# Patient Record
Sex: Female | Born: 1935 | Race: White | Hispanic: No | Smoking: Never smoker
Health system: Southern US, Community
[De-identification: ages and names within clinical notes are randomized; demographics above are authoritative.]

## PROBLEM LIST (undated history)

## (undated) DIAGNOSIS — M1990 Unspecified osteoarthritis, unspecified site: Secondary | ICD-10-CM

## (undated) DIAGNOSIS — E785 Hyperlipidemia, unspecified: Secondary | ICD-10-CM

## (undated) DIAGNOSIS — H409 Unspecified glaucoma: Secondary | ICD-10-CM

## (undated) DIAGNOSIS — I25810 Atherosclerosis of coronary artery bypass graft(s) without angina pectoris: Secondary | ICD-10-CM

## (undated) DIAGNOSIS — C4491 Basal cell carcinoma of skin, unspecified: Secondary | ICD-10-CM

## (undated) DIAGNOSIS — C801 Malignant (primary) neoplasm, unspecified: Secondary | ICD-10-CM

## (undated) DIAGNOSIS — I2510 Atherosclerotic heart disease of native coronary artery without angina pectoris: Secondary | ICD-10-CM

## (undated) DIAGNOSIS — M719 Bursopathy, unspecified: Secondary | ICD-10-CM

## (undated) DIAGNOSIS — Z87442 Personal history of urinary calculi: Secondary | ICD-10-CM

## (undated) DIAGNOSIS — R112 Nausea with vomiting, unspecified: Secondary | ICD-10-CM

## (undated) DIAGNOSIS — I214 Non-ST elevation (NSTEMI) myocardial infarction: Secondary | ICD-10-CM

## (undated) DIAGNOSIS — I639 Cerebral infarction, unspecified: Secondary | ICD-10-CM

## (undated) DIAGNOSIS — I729 Aneurysm of unspecified site: Secondary | ICD-10-CM

## (undated) DIAGNOSIS — J309 Allergic rhinitis, unspecified: Secondary | ICD-10-CM

## (undated) DIAGNOSIS — Z951 Presence of aortocoronary bypass graft: Secondary | ICD-10-CM

## (undated) DIAGNOSIS — I10 Essential (primary) hypertension: Secondary | ICD-10-CM

## (undated) DIAGNOSIS — L570 Actinic keratosis: Secondary | ICD-10-CM

## (undated) DIAGNOSIS — Z789 Other specified health status: Secondary | ICD-10-CM

## (undated) DIAGNOSIS — F0390 Unspecified dementia without behavioral disturbance: Secondary | ICD-10-CM

## (undated) DIAGNOSIS — H269 Unspecified cataract: Secondary | ICD-10-CM

## (undated) DIAGNOSIS — Z9889 Other specified postprocedural states: Secondary | ICD-10-CM

## (undated) HISTORY — PX: TRANSTHORACIC ECHOCARDIOGRAM: SHX275

## (undated) HISTORY — PX: BUNIONECTOMY: SHX129

## (undated) HISTORY — DX: Non-ST elevation (NSTEMI) myocardial infarction: I21.4

## (undated) HISTORY — DX: Basal cell carcinoma of skin, unspecified: C44.91

## (undated) HISTORY — PX: NM MYOVIEW LTD: HXRAD82

## (undated) HISTORY — DX: Essential (primary) hypertension: I10

## (undated) HISTORY — DX: Atherosclerotic heart disease of native coronary artery without angina pectoris: I25.10

## (undated) HISTORY — DX: Presence of aortocoronary bypass graft: Z95.1

## (undated) HISTORY — DX: Actinic keratosis: L57.0

## (undated) HISTORY — DX: Hyperlipidemia, unspecified: E78.5

## (undated) HISTORY — PX: OTHER SURGICAL HISTORY: SHX169

## (undated) HISTORY — DX: Unspecified dementia, unspecified severity, without behavioral disturbance, psychotic disturbance, mood disturbance, and anxiety: F03.90

## (undated) HISTORY — DX: Other specified health status: Z78.9

## (undated) HISTORY — PX: EYE SURGERY: SHX253

## (undated) HISTORY — DX: Allergic rhinitis, unspecified: J30.9

## (undated) HISTORY — DX: Unspecified cataract: H26.9

## (undated) HISTORY — DX: Unspecified osteoarthritis, unspecified site: M19.90

## (undated) HISTORY — DX: Atherosclerosis of coronary artery bypass graft(s) without angina pectoris: I25.810

---

## 1999-10-14 ENCOUNTER — Inpatient Hospital Stay (HOSPITAL_COMMUNITY): Admission: RE | Admit: 1999-10-14 | Discharge: 1999-10-19 | Payer: Self-pay | Admitting: Orthopedic Surgery

## 1999-10-14 ENCOUNTER — Encounter: Payer: Self-pay | Admitting: Orthopedic Surgery

## 2000-12-28 ENCOUNTER — Encounter: Payer: Self-pay | Admitting: Orthopedic Surgery

## 2001-01-02 ENCOUNTER — Encounter: Payer: Self-pay | Admitting: Orthopedic Surgery

## 2001-01-02 ENCOUNTER — Observation Stay (HOSPITAL_COMMUNITY): Admission: RE | Admit: 2001-01-02 | Discharge: 2001-01-03 | Payer: Self-pay | Admitting: Orthopedic Surgery

## 2002-01-02 ENCOUNTER — Encounter: Payer: Self-pay | Admitting: Orthopedic Surgery

## 2002-01-06 ENCOUNTER — Inpatient Hospital Stay (HOSPITAL_COMMUNITY): Admission: RE | Admit: 2002-01-06 | Discharge: 2002-01-11 | Payer: Self-pay | Admitting: Orthopedic Surgery

## 2003-01-21 ENCOUNTER — Other Ambulatory Visit: Admission: RE | Admit: 2003-01-21 | Discharge: 2003-01-21 | Payer: Self-pay | Admitting: Family Medicine

## 2004-01-30 DIAGNOSIS — I214 Non-ST elevation (NSTEMI) myocardial infarction: Secondary | ICD-10-CM

## 2004-01-30 DIAGNOSIS — Z951 Presence of aortocoronary bypass graft: Secondary | ICD-10-CM

## 2004-01-30 HISTORY — DX: Presence of aortocoronary bypass graft: Z95.1

## 2004-01-30 HISTORY — DX: Non-ST elevation (NSTEMI) myocardial infarction: I21.4

## 2004-02-19 ENCOUNTER — Encounter (INDEPENDENT_AMBULATORY_CARE_PROVIDER_SITE_OTHER): Payer: Self-pay | Admitting: *Deleted

## 2004-02-19 ENCOUNTER — Inpatient Hospital Stay (HOSPITAL_COMMUNITY): Admission: AD | Admit: 2004-02-19 | Discharge: 2004-02-26 | Payer: Self-pay | Admitting: Cardiology

## 2004-02-19 ENCOUNTER — Emergency Department (HOSPITAL_COMMUNITY): Admission: EM | Admit: 2004-02-19 | Discharge: 2004-02-19 | Payer: Self-pay | Admitting: Emergency Medicine

## 2004-02-19 HISTORY — PX: OTHER SURGICAL HISTORY: SHX169

## 2004-02-22 HISTORY — PX: CORONARY ARTERY BYPASS GRAFT: SHX141

## 2004-03-21 ENCOUNTER — Encounter (HOSPITAL_COMMUNITY): Admission: RE | Admit: 2004-03-21 | Discharge: 2004-06-19 | Payer: Self-pay | Admitting: *Deleted

## 2004-03-21 ENCOUNTER — Encounter
Admission: RE | Admit: 2004-03-21 | Discharge: 2004-03-21 | Payer: Self-pay | Admitting: Thoracic Surgery (Cardiothoracic Vascular Surgery)

## 2004-06-20 ENCOUNTER — Encounter (HOSPITAL_COMMUNITY): Admission: RE | Admit: 2004-06-20 | Discharge: 2004-09-18 | Payer: Self-pay | Admitting: *Deleted

## 2005-05-29 ENCOUNTER — Emergency Department (HOSPITAL_COMMUNITY): Admission: EM | Admit: 2005-05-29 | Discharge: 2005-05-29 | Payer: Self-pay | Admitting: Emergency Medicine

## 2005-11-21 ENCOUNTER — Other Ambulatory Visit: Admission: RE | Admit: 2005-11-21 | Discharge: 2005-11-21 | Payer: Self-pay | Admitting: Family Medicine

## 2006-05-23 HISTORY — PX: CARDIOVASCULAR STRESS TEST: SHX262

## 2006-06-30 DIAGNOSIS — I25810 Atherosclerosis of coronary artery bypass graft(s) without angina pectoris: Secondary | ICD-10-CM

## 2006-06-30 HISTORY — DX: Atherosclerosis of coronary artery bypass graft(s) without angina pectoris: I25.810

## 2006-07-23 ENCOUNTER — Encounter: Admission: RE | Admit: 2006-07-23 | Discharge: 2006-07-23 | Payer: Self-pay | Admitting: *Deleted

## 2006-07-25 ENCOUNTER — Ambulatory Visit (HOSPITAL_COMMUNITY): Admission: RE | Admit: 2006-07-25 | Discharge: 2006-07-25 | Payer: Self-pay | Admitting: *Deleted

## 2006-07-25 HISTORY — PX: OTHER SURGICAL HISTORY: SHX169

## 2008-02-24 ENCOUNTER — Emergency Department (HOSPITAL_COMMUNITY): Admission: EM | Admit: 2008-02-24 | Discharge: 2008-02-24 | Payer: Self-pay | Admitting: Emergency Medicine

## 2009-07-15 HISTORY — PX: OTHER SURGICAL HISTORY: SHX169

## 2010-08-20 ENCOUNTER — Emergency Department (HOSPITAL_COMMUNITY): Payer: Medicare Other

## 2010-08-20 ENCOUNTER — Emergency Department (HOSPITAL_COMMUNITY)
Admission: EM | Admit: 2010-08-20 | Discharge: 2010-08-20 | Disposition: A | Discharge status: Home / Self Care | Payer: Medicare Other | Attending: Emergency Medicine | Admitting: Emergency Medicine

## 2010-08-20 DIAGNOSIS — I10 Essential (primary) hypertension: Secondary | ICD-10-CM | POA: Insufficient documentation

## 2010-08-20 DIAGNOSIS — I2510 Atherosclerotic heart disease of native coronary artery without angina pectoris: Secondary | ICD-10-CM | POA: Insufficient documentation

## 2010-08-20 DIAGNOSIS — Z951 Presence of aortocoronary bypass graft: Secondary | ICD-10-CM | POA: Insufficient documentation

## 2010-08-20 DIAGNOSIS — Z79899 Other long term (current) drug therapy: Secondary | ICD-10-CM | POA: Insufficient documentation

## 2010-08-20 DIAGNOSIS — R071 Chest pain on breathing: Secondary | ICD-10-CM | POA: Insufficient documentation

## 2010-08-20 LAB — DIFFERENTIAL
Basophils Absolute: 0 10*3/uL (ref 0.0–0.1)
Basophils Relative: 0 % (ref 0–1)
Lymphocytes Relative: 26 % (ref 12–46)
Monocytes Absolute: 0.8 10*3/uL (ref 0.1–1.0)
Monocytes Relative: 9 % (ref 3–12)
Neutro Abs: 5.6 10*3/uL (ref 1.7–7.7)
Neutrophils Relative %: 64 % (ref 43–77)

## 2010-08-20 LAB — COMPREHENSIVE METABOLIC PANEL
AST: 21 U/L (ref 0–37)
Albumin: 4.2 g/dL (ref 3.5–5.2)
BUN: 17 mg/dL (ref 6–23)
Calcium: 9.9 mg/dL (ref 8.4–10.5)
Creatinine, Ser: 0.98 mg/dL (ref 0.4–1.2)
GFR calc Af Amer: >60 mL/min (ref >60)
Total Bilirubin: 0.6 mg/dL (ref 0.3–1.2)
Total Protein: 6.9 g/dL (ref 6.0–8.3)

## 2010-08-20 LAB — POCT CARDIAC MARKERS
CKMB, poc: <1 ng/mL — ABNORMAL LOW (ref 1.0–8.0)
CKMB, poc: <1 ng/mL — ABNORMAL LOW (ref 1.0–8.0)
Myoglobin, poc: 68.9 ng/mL (ref 12–200)
Myoglobin, poc: 60.5 ng/mL (ref 12–200)
Troponin i, poc: <0.05 ng/mL (ref 0.00–0.09)
Troponin i, poc: <0.05 ng/mL (ref 0.00–0.09)

## 2010-08-20 LAB — D-DIMER, QUANTITATIVE: D-Dimer, Quant: 0.33 ug/mL-FEU (ref 0.00–0.48)

## 2010-08-20 LAB — CBC
HCT: 39.8 % (ref 36.0–46.0)
Hemoglobin: 13.8 g/dL (ref 12.0–15.0)
MCHC: 34.7 g/dL (ref 30.0–36.0)
RBC: 4.36 MIL/uL (ref 3.87–5.11)

## 2010-09-16 NOTE — Cardiovascular Report (Signed)
[name], [name]              ACCOUNT NO.:  [id number]   MEDICAL RECORD NO.:  [id number]          PATIENT TYPE:  OIB   LOCATION:  2899                         FACILITY:  [facility]   PHYSICIAN:  Darlin Priestly, MD  DATE OF BIRTH:  August 01, 1931   DATE OF PROCEDURE:  07/25/2006  DATE OF DISCHARGE:                            CARDIAC CATHETERIZATION   PROCEDURE:  1. Left heart catheterization.  2. Coronary angiography.  3. Left ventriculogram.  4. Left internal mammary angiography.  5. Saphenous vein graft with angiography.   ATTENDING PHYSICIAN:  Dr. Lenise Herald.   COMPLICATIONS:  None.   INDICATIONS FOR PROCEDURE:  This patient is a 77 year old female,  patient of Dr. Merri Brunette, with a history of CAD, status post cardiac  catheterization in October of 2005, consisting of a 99% proximal LAD  involving a diagonal as well as disease of the ramus intermedius with  the EF of 40% to 50%.  She had noncritical disease of the RCA.  She  underwent bypass surgery consisting of a LIMA to LAD, vein graft to  diagonal, vein graft to ramus intermedius, and had done well since that  time.  It should be noted she had no significant chest pain prior to her  cardiac catheterization with the exception of one episode on the night  prior to admission.  She did recently undergo scanning, Cardiolite scan,  suggesting now anterolateral and inferolateral mild ischemia, normal EF.  She is now brought for repeat catheterization to reassess her graft  status.   DESCRIPTION OF PROCEDURE:  After obtaining informed consent, the patient  was brought to the cardiac cath lab.  Right groin was shaved, prepped  and draped in the usual sterile fashion.  ECG monitoring was  established.  Using the modified Seldinger technique, a 6-French  arterial sheath was inserted into the right femoral artery.  A #6 French  diagnostic catheter is use to perform diagnostic angiography.   Left main is a large vessel with  no significant disease.   The LAD is a medium-sized vessel which fills from __________ mid distal  portion.  There is 50% proximal LAD stenosis.  The mid-distal LAD feels  the patent LIMA, which inserts in the mid-portion of the LAD with no  significant disease beyond the IMA insertion.  This does fill one  diagonal branch.   The saphenous vein graft to the diagonal is totally occluded at its  ostium.   The left coronary artery gives rise to a medium-sized ramus intermedius,  which is difficult to visualize secondary to tortuosity.  There is  approximately 70% to 80% proximal stenosis which was noted on a prior  catheterization.  There is TIMI-3 flow into this vessel.   The left circumflex is a medium-sized vessel that courses the AV groove  and gives rise to one obtuse marginal branch.  The AV groove circumflex  has no significant disease.   The first OM is a medium-sized vessel with no significant disease.   The saphenous vein graft to the diagonal is totally occluded at its  ostium.   The RCA is a  large vessel which is dominant and gives rise to PDA as  well as posterolateral branch.  There is mild 30% mid-RCA narrowing, but  no further high-grade stenosis.   Left ventriculogram reveals a preserved EF at 60%.   HEMODYNAMICS:  Systemic arterial pressure 128/51, LV systemic pressure  131/5, LVEDP of 18.   CONCLUSION:  1. Significant two-vessel coronary artery disease.  2. Patent LIMA to the left anterior descending artery with no      significant disease beyond internal mammary artery insertion.  3. Totally occluded vein graft to the diagonal.  4. Totally occluded vein graft to the ramus intermedius.  5. Normal left ventricular systolic function.      Darlin Priestly, MD  Electronically Signed     RHM/MEDQ  D:  07/25/2006  T:  07/25/2006  Job:  [id number]   cc:   Dario Guardian, M.D.

## 2010-09-16 NOTE — Op Note (Signed)
TNAMEHADAR, ELGERSMA                       ACCOUNT NO.:  [id number]   MEDICAL RECORD NO.:  [id number]                   PATIENT TYPE:  INP   LOCATION:  X001                                 FACILITY:  [facility]   PHYSICIAN:  Gus Rankin. Aluisio, M.D.              DATE OF BIRTH:  16-May-1931   DATE OF PROCEDURE:  01/06/2002  DATE OF DISCHARGE:                                 OPERATIVE REPORT   PREOPERATIVE DIAGNOSIS:  Osteoarthritis, right knee.   POSTOPERATIVE DIAGNOSIS:  Osteoarthritis, right knee.   PROCEDURE:  Right total knee arthroplasty.   SURGEON:  Gus Rankin. Aluisio, M.D.   ASSISTANT:  Alexzandrew L. Perkins, PA   ANESTHESIA:  Spinal.   ESTIMATED BLOOD LOSS:  Minimal.   DRAINS:  Hemovac x1.   COMPLICATIONS:  None.   TOURNIQUET TIME:  49 minutes at 300 mmHg.   CONDITION:  Stable to recovery.   BRIEF CLINICAL NOTE:  [name] is a 77 year old female with severe  osteoarthritis of the right knee with pain refractory to nonoperative  management. She has had a previous very successful left total knee and  presents now for right total knee arthroplasty.   DESCRIPTION OF PROCEDURE:  After successful administration of spinal  anesthetic, a tourniquet was placed high on her right thigh, right lower  extremity prepped and draped in the usual sterile fashion. The extremity was  wrapped in Esmarch, knee flexed, tourniquet inflated to 300 mmHg. A midline  incision was made with a 10 blade through subcutaneous tissue to the level  of the extensor mechanism. A fresh blade was used to make a medial  parapatellar arthrotomy. The soft tissue over the proximal medial tibia  subperiosteally elevated to the joint line with a knife and semimembranosus  bursa with a curved osteotome. The soft tissue over the proximal lateral  tibia was also elevated with attention being paid to avoiding the patellar  tendon on tibial tubercle. The lateral patellofemoral ligament was cut,  patella everted,  knee flexed 90 degrees. ACL and PCL  were removed. She had  a large ganglion inside the PCL and that is excised with the PCL. A drill  was used to create a starting hole in the distal femur, canal irrigated and  5 degree right valgus alignment guide placed. Referencing off the posterior  condyles, rotations marked and a block pinned to remove 9 mm off the distal  femur. Distal femoral resection was then made with an oscillating saw.   The sizing block was placed, size 4 is most appropriate. Rotation  corresponds with the epicondylar axis. Rotation is marked and the block  pinned for the anterior and posterior cuts. Those cuts were made and the  tibia subluxed forward and the menisci removed. Extramedullary tibial  alignment guide is placed referencing proximally at the medial aspect of the  tibial tubercle and distally along the second metatarsal axis of the tibial  crest. The block  is pinned to remove 10 mm off the nondeficient lateral  side. Tibial resection is made with an oscillating saw. The cut bone is  removed and size 3 is the most appropriate tibia. The modular drill and keel  punch are then used to repair the proximal tibia.   The intercondylar chamfer blocks are placed on the distal femur and those  cuts made. A size four posterior stabilized femoral trial was placed with  the size removed during tibial trial and a 12.5 mm posterior stabilized  rotating platform insert. We went up to a 15 which allowed for full  extension with great varus and valgus balance down throughout the entire  range of motion. The patella was then everted, thickness measured to be 23  mm with free hand resection taken down to 13 mm. The lug holes were placed  with a 28 and the trial patella placed which tracks normally. The  osteophytes were then removed off the posterior femur with the femoral trial  placed. All trials were then removed and the cut bone surfaces prepared with  pulsatile lavage. The  cement was mixed and once ready for implantation, the  size 3 mobile bearing tibial tray, size 4 posterior stabilized femur and 38  patella cemented into place, patella is held with a clamp. A trial 15 mm  insert is placed, knee held in full extension, all extruded cement removed.  Once the cement is fully hardened then a permanent 15 mm posterior  stabilized rotating platform is insert is placed into the tibial tray. The  knee is then copiously irrigated with antibiotic solution and extensor  mechanism closed over a Hemovac drain with interrupted #1 PDS. The  tourniquet was then released with a total time of 49 minutes. Flexion  against gravity is 135 degrees. The subcu is closed with interrupted 2-0  Vicryl, subcuticular running 4-0 Monocryl. The incision is cleaned and dried  and Steri-Strips and a bulky sterile dressing applied. The patient is  subsequently awakened and transported to recovery in stable condition.                                                Gus Rankin Aluisio, M.D.    FVA/MEDQ  D:  01/06/2002  T:  01/07/2002  Job:  [id number]

## 2010-09-16 NOTE — Op Note (Signed)
[facility]  Patient:    [name], [name] Visit Number: [id number] MRN: [id number]          Service Type: SUR Location: 4W 0460 01 Attending Physician:  Sherri Rad Dictated by:   Sherri Rad, M.D. Proc. Date: 01/02/01 Admit Date:  01/02/2001                             Operative Report  PREOPERATIVE DIAGNOSES: 1. Right hypermobile hallux valgus. 2. Dorsal base of first metatarsal spur. 3. Dorsal medial cuneiform spur.  POSTOPERATIVE DIAGNOSES: 1. Right hypermobile hallux valgus. 2. Dorsal base of first metatarsal spur. 3. Dorsal medial cuneiform spur.  OPERATION: 1. Right first correctional tarsometatarsal arthrodesis. 2. Right modified McBride bunionectomy. 3. Local bone graft. 4. Partial excision dorsal base of the first metatarsal. 5. Excision dorsal medial cuneiform spur.  SURGEON:  Sherri Rad, M.D.  ASSISTANT:  Druscilla Brownie. Underwood III, P.A.-C.  ANESTHESIA:  Spinal with sedation.  ESTIMATED BLOOD LOSS:  Minimal.  TOURNIQUET TIME:  90 minutes.  COMPLICATIONS:  None.  DISPOSITION:  Stable to recovery room.  INDICATIONS:  This is a 77 year old, very pleasant female who has had a longstanding history of right great toe pain.  She has tried conservative management which has included wider toe blocked shoes, anti-inflammatories and orthotics, all of which have not helped.  She was consented for the above procedure.  All risks, which include infection, neurovascular injury, malunion, nonunion, persistent pain, worsening of pain, recurrence of deformity, arthritis, DVT with possible PE, possible removal of painful hardware, were all explained.  Questions were answered.  DESCRIPTION OF PROCEDURE:  The patient was brought to the operating room and placed in the supine position.  After adequate spinal anesthesia was administered as well as Ancef 1 g IV piggyback, the right lower extremity was then prepped and draped in a  sterile manner over proximally placed thigh tourniquet.  The right lower extremity was then gravity exsanguinated. Tourniquet was elevated to 290 mmHg.  The procedure commenced with a dorsal incision just lateral to the EHL over the first tarsometatarsal joint.  Dissection was carried down to bone with an iris scissors.  Neurovascular structures were protected, and hemostasis was obtained.  Using a #15 blade scalpel, the first tarsometatarsal joint, periosteum, and capsule were elevated.  Using a curve 1/4-inch osteotome, the large dorsal osteophytes were removed from both the base of the first metatarsal as well as the distal aspect of the medial cuneiform.  This was saved for later bone graft.  The joint was then opened and the cartilage then removed with the curved 1/4-inch osteotome and a curet.  We then, using an oscillating saw, performed an osteotomy about the distal aspect of the medial cuneiform for the correctional portion of the procedure.  There was also a large spur on the lateral aspect of the base of the first metatarsal that was osteotomized as well.  A 2.0 mm K wire was then used to make multiple drill holes in both sides of the bone, subchondral type, and we then approached the lateral aspect of the first metatarsophalangeal joint through the distal aspect of the wound.  Neurovascular structures were protected.  The capsule was then opened just above the adductor tendon.  The capsule was then slightly released plantarly and dorsally around the metatarsal head.  We then made a longitudinal incision over the medial aspect of the first metatarsophalangeal joint.  Dissection  was carried down to capsule.  Neurovascular structures were protected both dorsally and plantarly.  The capsule was then opened in line with the incision.  The first metatarsal was then reduced in the proper position, and a reduction clamp was then applied proximally.  A bur was then used to create a  tapered hole to accommodate the lag screw that would be placed in a retrograde manner across the first tarsometatarsal joint arthrodesis.  A 3.5 mm glide hole was then placed.  With the toe in a reduced position and palpated plantarly so that there was no too much dorsiflexion or plantar flexion, it was in a proper alignment as was palpated as well, a 2.5 mm purchase hole was placed into the medial cuneiform.  A 36 mm, fully threaded, 3.5 mm cortical screw was then placed.  This had excellent compression of the arthrodesis site.  We then placed a screw after a tapered hole with the bur was then placed in the medial cuneiform, a lag screw type, from the medial cuneiform into the base of the first metatarsal.  Again, this was a 30 mm, fully threaded, 3.5 mm cortical screw.  This had excellent compression of the arthrodesis site and maintenance of the reduction.  Bur was then placed to put stress-strain, leaving bone graft on the lateral and dorsal medial aspect of the first tarsometatarsal joint.  Local bone graft was then packed into this after the wound was copiously irrigated with normal saline. We then reduced the sesamoids further with elliptical excision of the medial redundant capsule.  The capsule was then repaired with 2-0 Vicryl sutures sequentially until it was maintained in the proper position in regards to sesamoid reduction. X-rays were then obtained in AP and lateral planes and showed excellent correction of the metatarsus primus varus as well as the sesamoids underneath the metatarsal head.  Also prior to closure of the capsule, I failed to mention that the prominent medial metatarsal head was osteotomized with an oscillating saw at the sagittal sulcus.  This was done, and then the corners were then trimmed, especially dorsally, with a rongeur so it was nice and smooth around its corners.  That area was also copiously irrigated before capsular closure.  The skin was closed  with 3-0 Vicryl and then 4-0 Monocryl subcuticular stitches, both wounds.  Sterile dressing was applied, and a bunion dressing  was applied.  A modified Jones dressing was applied.  The patient went stable to the recovery room. Dictated by:   Sherri Rad, M.D. Attending Physician:  Sherri Rad DD:  01/02/01 TD:  01/02/01 Job: [id number]

## 2010-09-16 NOTE — Op Note (Signed)
[facility]  Patient:    [name], [name]                     MRN: [id number] Proc. Date: 10/14/99 Adm. Date:  [id number] Attending:  Ollen Gross V                           Operative Report  PREOPERATIVE DIAGNOSIS:  Osteoarthritis, left knee.  POSTOPERATIVE DIAGNOSIS:  Osteoarthritis, left knee.  PROCEDURE:  Left total knee arthroplasty.  SURGEON:  Ollen Gross, M.D.  ASSISTANT:  Alexzandrew L. Perkins, P.A.-C.  ANESTHESIA:  Spinal.  ESTIMATED BLOOD LOSS:  Minimal.  DRAIN:  Hemovac x 1.  TOURNIQUET TIME:  66 minutes at 350 mmHg.  COMPLICATIONS:  None.  CONDITION:  Stable to recovery.  BRIEF HISTORY:  [name] is a 77 year old female who has severe osteoarthritis of her left knee with a very significant varus deformity. She has had pain refractory to non-operative management and presents now for left total knee arthroplasty.  DESCRIPTION OF PROCEDURE:  After successful administration of spinal anesthetic, the tourniquet was placed high on the left thigh and left lower extremity prepped and draped in the usual sterile fashion. The leg was wrapped in Esmarch, knee flexed and tourniquet inflated to 350 mmHg. A standard midline incision was made, skin cut with a 10 blade in the subcutaneous tissue at the level of the extensor mechanism. A fresh blade was used to make medial parapatellar arthrotomy. At this point, the soft tissue over the medial proximal tibia was subperiosteally elevated at the joint line with a knife and semimembranosus bursa with a curved osteotome. The soft tissue over the lateral tibia is also elevated with attention being paid to avoid the patellar tendon on tibial tubercle. The lateral patellofemoral ligament is cut and patella everted and effluxed 90 degrees. She had tremendous bone on bone changes of large osteophyte formation and essentially no cartilage left on the weightbearing surfaces of either compartment  mainly worse medially. The intercondylar osteophytes were removed and then the ACL and PCL removed. The hole was then drilled in the distal femur for the alignment rod. The alignment guide was placed measuring 5 degrees of valgus. Referencing off the posterior condyles rotations were marked in a block pin so as to remove 9 mm off the distal femur.  Distal femoral resection is then made. The sizing block is placed and size 4 is most appropriate. The block is pinned to match the epicondylar access and then anterior and posterior resection is made. At this point, the tibia subluxed forward and the menisci removed. The extramedullary tibial alignment guide is placed referencing proximally at the medial third of the tibial tubercle and distally along the tibial crest and second metatarsal axis. The block is subsequently pinned so as to remove 10 mm off the non-deficient lateral side. Resection is made with an oscillating saw. A size 4 is the most appropriate for the tibia. The intercondylar block is then placed on the femur and intercondylar chamfering cuts are made. The trials are placed. A size 4 posterior stabilized femur size 4 rotating platform tibia with a 12 mm posterior stabilized rotating insert. She had full extension with a little bit of play to varus stressing. A 15 was placed and then eliminated all the varus valgus play. She had excellent tension in flexion with no varus or valgus laxity. At this point, the patella was everted, thickness measured to be 23  mm and resection taken down to 13 mm. A 38 mm patella insert trial was placed and the lug holes are drilled and then a trial placed with excellent tracking. The trials removed form the patella and then the tibia subluxed forward and the block is pinned and then the keel punch and modular drill are utilized to prepare the proximal tibia. The femoral trial is left in place and the posterior osteophytes removed. At this point, the cut  bone surfaces are prepared with pulsatile lavage and cement mix. Once ready for implantation, a size 4 mobile bearing tibia  with the size 4 posterior stabilized femur and the 38 mm patella, all cemented into place and the patella is held with the clamp. A 15 mm spacer is placed, knee held in full extension and all extruded cement removed. Once the cement is fully hardened, the a permanent 15 mm posterior stabilized rotating platform insert is placed into the tibial tray. She had full extension and excellent stability varus to valgus stressing and then great stability with greater than 95 degrees of flexion. At this point, the wound is copiously irrigated with antibiotic solution and extensor mechanism closed over 1 limb of the Hemovac drain with interrupted #1 PDS. The subcu is closed with interrupted 2-0 Vicryl, subcuticular with running 4-0 monocryl. Prior to closing the subcu layers flexing against gravity was performed and she had 135 degrees. Once the incision was fully closed and its cleaned and dried and Steri-Strips and a bulky sterile dressing applied. Drains hooked to suction. She was placed into a knee immobilizer, awakened and transported to recovery stable condition. DD:  10/14/99 TD:  10/18/99 Job: [id number]

## 2010-09-16 NOTE — Discharge Summary (Signed)
NAMEKIMIYO, CARMICHEAL                        ACCOUNT NO.:  [id number]   MEDICAL RECORD NO.:  [id number]                   PATIENT TYPE:  INP   LOCATION:  0470                                 FACILITY:  [facility]   PHYSICIAN:  Ollen Gross, MD                   DATE OF BIRTH:  [birth year]   DATE OF ADMISSION:  01/06/2002  DATE OF DISCHARGE:  01/11/2002                                 DISCHARGE SUMMARY   ADMITTING DIAGNOSES:  1. Osteoarthritis, right knee.  2. Hypertension.  3. Elevated cholesterol.  4. Hemorrhoids.  5. Status post left total knee replacement arthroplasty.   DISCHARGE DIAGNOSES:  1. Osteoarthritis, right knee, status post right total knee replacement     arthroplasty.  2. Postoperative hypokalemia, improved.  3. Hypertension.  4. Elevated cholesterol.  5. Hemorrhoids.  6. Status post left total knee replacement arthroplasty.   PROCEDURE:  The patient was taken to the OR on January 06, 2002, underwent  a right total knee replacement arthroplasty.  Surgeon Dr. Ollen Gross,  assistant Avel Peace, P.A.-C.  Surgery under spinal anesthesia, Hemovac  drain x 1, tourniquet time 40 minutes at 300 mmHg.   CONSULTS:  Rehabilitation services, Dr. Riley Kill.   BRIEF HISTORY:  The patient is a 77 year old female has had a longstanding  history of right knee pain.  She has previously undergone a left total knee  replacement arthroplasty with good results.  Right knee has started to  interfere with her daily activities and has been progressive in nature.  It  was felt she would benefit by undergoing replacement.  Risks and benefits  discussed, and she was subsequently admitted to the hospital.   LABORATORY DATA:  CBC on admission:  Hemoglobin 13.9, hematocrit 39.7, white  cell count 5.9, red cell count 4.45.  Postop H&H 12.1 and 34.5.  Last noted  H&H 11.9 and 34.7.  The differential on the admission CBC all within normal  limits.  PTT/PT on admission were 12.5 and 26,  respectively, with an INR of  0.9.  Serum pro times followed per Coumadin protocol.  Last noted PT/INR was  19.8 and 1.8.  Chem panel on admission showed low potassium at 2.7,  remaining chem panel all within normal limits.  Potassium came back up to  3.7; however postoperatively dropped again back down to 2.9.  She was  treated with potassium supplementation.  Potassium back up to normal limits  at 3.6 prior to discharge.  Urinalysis on admission showed moderate  leukocyte esterase with many epithelial cells, few bacteria, and only 7-10  white cells.  She was treated with 24 hours postop IV antibiotics.   EKG dated January 02, 2002:  Normal sinus rhythm, normal EKG confirmed by  Dr. Lewayne Bunting.  Chest x-ray dated January 02, 2002:  No change, scarring  again noted in right mid lung field.  No active disease.   HOSPITAL  COURSE:  The patient was admitted to [facility], taken to  the OR, underwent the above-stated procedure without complications.  The  patient tolerated the procedure well was taken to the recovery room and then  to the orthopedic floor for continue postoperative care.  Vital signs were  followed, may be found in the graph section of this chart.  The patient was  noted to have a low potassium at the time of surgery.  She was placed on  potassium supplements.  Potassium did drop postoperatively down to 2.9,  although it came back up and back up to 3.6 prior to her discharge.  She was  given 24 hours of postop IV antibiotics.  PT/OT was initiated for gait  training, ambulation, and ADL's.  Initially slow to progress with physical  therapy only ambulating approximately 12 feet by postop day two.  The  patient did undergo a rehab consult.  The patient was seen in consultation  by Dr. Riley Kill, and felt she would inpatient rehabilitation stay.  It was  decided the patient would be transferred to a rehab bed once she became  medically stable; however, through the  hospital course, it was noted there  were no beds available, and she did progress very well with physical therapy  and was ambulating greater than 175 feet by postop day three.  Due to the  fact that she did well, it was felt she would not require inpatient stay.  Discharge planning was consulted to assist with posthospitalization care.  She was placed on Coumadin for DVT prophylaxis.  She continued to improve  and by postop day five, she was doing quite well, ambulating independently,  and was decided to be discharged home.   DISCHARGE PLAN:  1. The patient discharged home on January 11, 2002.  2. Discharge diagnoses:  Please see above, transcribed.   DISCHARGE MEDICATIONS:  1. Coumadin as per pharmacy protocol.  2. Percocet for pain.  3. Robaxin for spasms.   DIET:  Diet as tolerated, low sodium diet.   ACTIVITY:  1. Weightbearing as tolerated.  2. Home health PT, home health nursing through [facility].  3. Total knee protocol.   FOLLOW UP:  Thursday following discharge.  Call the office for an  appointment.   CONDITION ON DISCHARGE:  Improved.     Alexzandrew L. Julien Girt, P.A.              Ollen Gross, MD    ALP/MEDQ  D:  02/07/2002  T:  02/08/2002  Job:  [id number]

## 2010-09-16 NOTE — H&P (Signed)
NAMENHU, GLASBY              ACCOUNT NO.:  [id number]   MEDICAL RECORD NO.:  [id number]          PATIENT TYPE:  INP   LOCATION:  2024                         FACILITY:  [facility]   PHYSICIAN:  Cristy Hilts. Jacinto Halim, MD       DATE OF BIRTH:  Sep 13, 1931   DATE OF ADMISSION:  02/19/2004  DATE OF DISCHARGE:                                HISTORY & PHYSICAL   CHIEF COMPLAINT:  Chest pain.   PRIMARY CARE PHYSICIAN:  Dr. Merri Brunette.   BRIEF HISTORY:  The patient is a 77 year old healthy female who developed  chest pressure about 10:30 p.m. last night.  The pain did not improve with  Tums.  It eventually spread to her left and right shoulders.  The patient  finally got up at 3 a.m. and drove herself to the emergency room at [facility].   The patient's first episode of pain she noted last p.m.  She exercises  regularly and Monday she noted she was a bit more tired than usual doing  Pilates.  On Wednesday she does water aerobics and noted that she had to  struggle to keep up, which was also unusual for her.  She had some brief  pressure Wednesday after her exercise but it went away and she had no  reoccurrence until last night.  She denies any palpitations, shortness of  breath, diaphoresis, or nausea with symptoms.  In the ER, her first EKG  showed ST depression in V2-V6 at 3:15 a.m.  The 4:15 a.m. EKG shows the ST  segment changes have resolved.  Her CKs were 2.3, 6.2, and 8.0 respectively.  First troponin was 0.05, second 0.55, and the third was 0.78.  Myoglobin was  170, 223, and 195.  The patient was placed on a heparin/nitroglycerin drip  in the emergency room at [facility].  Her symptoms resolved and she was  ultimately transferred to [facility].  The patient's blood pressure  at home was 150/98 which she knows is extremely high for her.  In the  emergency room, her first blood pressure was 174/82.  The patient was  admitted to [facility], was evaluated by  Dr. Jenne Campus, and will be  taken directly to the catheterization laboratory.   PAST MEDICAL HISTORY:  1.  Hypertension.  2.  Bilateral knee replacements - left 2001, right 2003.  3.  Hyperlipidemia with STATIN intolerances and ZETIA intolerance.  4.  Remote history of gastroesophageal reflux disease.   REVIEW OF SYSTEMS:  CV:  Negative.  PULMONARY:  Negative.  CARDIAC:  No  chest discomfort until this week.  GI:  She has rare gastroesophageal  disease, rarely takes anything, and has occasional constipation.  GU:  She  has trouble voiding while on nonsteroidal antiinflammatories.   FAMILY HISTORY:  Father died at 1 with history of alcohol abuse and  congestive heart failure.  Mother died at age 27 with Alzheimer's.  She has  two sisters deceased - one with diabetes and the second after a hip  replacement.  She has two sisters living - one with  history of angina,  hypercholesterolemia, and hypertension; the second sister is living and has  manic-depression.   CURRENT MEDICATIONS:  1.  Atenolol 100 mg daily.  2.  Lisinopril 10 mg daily - this was just increased secondary to her      increased blood pressure.   ALLERGIES:  None.   INTOLERANCES:  1.  NONSTEROIDAL ANTIINFLAMMATORY DRUGS cause trouble voiding.  2.  ZETIA causes lower extremity edema.  3.  ZOCOR, LIPITOR, and LESCOL have all caused significant myalgias.   PHYSICAL EXAMINATION:  GENERAL:  This is a well-nourished, well-developed  white female in no acute distress, slightly overweight.  VITAL SIGNS:  Her blood pressure currently is 110/64, her heart rate is 59  in sinus rhythm, respiratory rate is 20, and saturations were not measured.  Weight is 176, height is 67 inches.  HEENT:  Within normal limits.  NECK:  No JVD, no bruits, no thyromegaly.  CHEST:  Clear to auscultation and percussion.  CARDIAC:  No murmurs, rubs, or gallops.  Normal S1 and S2.  No S3 or S4.  ABDOMEN:  Soft, nontender, positive bowel sounds, no  hepatosplenomegaly.  GENITOURINARY/RECTAL:  Deferred.  LOWER EXTREMITIES:  No clubbing, cyanosis, or edema.  NEUROLOGIC:  No focal changes.   IMPRESSION:  1.  Non-Q subendocardial myocardial infarction with anterolateral changes      and positive enzymes.  2.  Hypertension, well controlled.  3.  Hyperlipidemia, mild, with STATIN and ZETIA intolerance.  4.  Osteoarthritis with bilateral knee replacements.   PLAN:  The patient is taken directly to the catheterization laboratory for  evaluation.      Will   WDJ/MEDQ  D:  02/19/2004  T:  02/19/2004  Job:  [id number]

## 2010-09-16 NOTE — Discharge Summary (Signed)
[facility]  Patient:    [name], [name]                     MRN: [id number] Adm. Date:  [id number] Disc. Date: [id number] Attending:  Loanne Drilling Dictator:   Druscilla Brownie [name], P.A.                           Discharge Summary  ADMISSION DIAGNOSES: 1.  End stage osteoarthritis, degeneration of the left knee. 2.  Hypertension. 3.  Occasional GERD.  DISCHARGE DIAGNOSES: 1.  End stage osteoarthritis, degeneration of the left knee. 2.  Hypertension. 3.  Occasional GERD.  PROCEDURE:  On October 14, 1999 the patient underwent left total knee replacement arthroplasty with all three components cemented, Avel Peace, P.A.C. assisted.  CONSULTS:  None.  BRIEF HISTORY:  This 77 year-old lady was seen by [place] for continuing problems concerning her left knee.  This pain has been going on for many years.  When living in [place] she had several cortisone injections to the knee which helped her for a short period of time, however, she is continuing with pain. 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  Three Synvisc injections did not relieve her discomfort as well, as well as nonsteroidal anti-inflammatories.  With after much discussion it was felt that this patient would benefit from surgical intervention and would be admitted for the above procedure.  HOSPITAL COURSE:  The patient tolerated the surgical procedure quite well. Had postoperative nausea which we attributed to her history of GERD as well as some of her pain medications.  We weaned her from the PCA medications and she did quite well.  Her hemoglobin remained stable throughout her hospitalization.  She was eager to enter into total knee protocol and progressed quite nicely while in the hospital.  At the time of discharge she had received her ADLs for home, was ambulating in the hall, doing quite  well. Wound was dry, neurovascular intact in the left lower extremity and vital signs were stable on the day of discharge.  The patient had no further vomiting episodes.  LABORATORY DATA:  Laboratory values in the hospital hematologically showed a CBC with differential on admission which was completely within normal limits. Final hemoglobin was 12.2 with hematocrit of 34.6.  Blood chemistries were essentially within normal limits.  Preoperatively did show a slightly elevated BUN, however, when repeated this was perfectly normal.  Urinalysis negative for urinary tract infection.  No chest x-ray seen on the chart. Electrocardiogram from December, 2000 showed "within normal limits".  CONDITION ON DISCHARGE:  Improved and stable.  PLAN:  The patient is discharged to her home in the care of her family.  She will have Home Health as well as nursing visits for blood draws for Coumadin. She is to continue on Coumadin protocol three weeks after the date of surgery. Resume her home medications and diet.  DISCHARGE MEDICATIONS: 1.  Phenergan tabs 25 mg, #30, 1 q.6h. p.r.n. nausea. 2.  Robaxin 500 mg, #30 with a refill, 1 q.6h. p.r.n. muscle spasm. 3.  Darvocet-N 100, #50 with a refill, 1 to 2 q.4-6h. p.r.n. pain.  DISCHARGE INSTRUCTIONS:  Use dry dressing to left knee p.r.n.  Return to the center in about two weeks after the date of surgery. DD:  10/19/99 TD:  10/20/99  Job: [id number]

## 2010-09-16 NOTE — Discharge Summary (Signed)
[name], [name]              ACCOUNT NO.:  [id number]   MEDICAL RECORD NO.:  [id number]          PATIENT TYPE:  INP   LOCATION:  2033                         FACILITY:  [facility]   PHYSICIAN:  Salvatore Decent. Cornelius Moras, M.D. DATE OF BIRTH:  1931-09-29   DATE OF ADMISSION:  02/19/2004  DATE OF DISCHARGE:  02/26/2004                                 DISCHARGE SUMMARY   ADMISSION DIAGNOSIS:  Non-Q wave subendocardial myocardial infarction.   DISCHARGE DIAGNOSIS:  1.  Severe three vessel coronary artery disease status post non-Q wave      myocardial infarction and subsequent coronary artery bypass grafting x 3      completed on February 22, 2004.  2.  History of hypertension.  3.  History of bilateral knee replacements, left in 2001, right in 2003.  4.  Hyperlipidemia with statin intolerances as well as Zetia intolerance.  5.  Remote history of gastroesophageal reflux disease.   HOSPITAL MANAGEMENT/PROCEDURES:  1.  Cardiac catheterization February 19, 2004, which revealed severe two      vessel coronary artery disease with a mildly depressed left ventricular      systolic function with an estimated ejection fraction of 35-40%.  2.  2D echocardiogram completed on October 21.  This revealed no evidence of      aortic valve or mitral valvular disease.  Overall left ventricular      systolic function was mildly decreased with an estimated ejection      fraction of 40-50%.  3.  Pulmonary arterial evaluation including bilateral carotid Duplex exam,      palmar arch test, and bilateral ABIs, completed on October 21.  4.  Emergent coronary artery bypass grafting x 3 utilizing the left internal      mammary artery to the left anterior descending, saphenous vein graft to      the first diagonal branch, saphenous vein graft to the ramus      intermediate branch, this was completed by Dr. Cornelius Moras.  5.  Initiation of cardiac rehab phase 1.   CONSULTATIONS:  Cardiac rehabilitation.   HISTORY OF PRESENT  ILLNESS:  [name] is a 77 year old healthy female with  a history of hypertension and hyperlipidemia, who developed chest pressure  on the evening of February 18, 2004.  The patient was packing her bags for an  anticipated cruise.  The patient developed severe substernal chest pressure  that radiated to both shoulders.  Her pain persisted ultimately prompting  the patient to present to the emergency room  in the early a.m. of February 19, 2004.  In the emergency room, the patient's EKG showed ST depression in  v2 to v6.  Her CKs were 2.3, 6.2, and 8 respectively.  The patient's  troponins were elevated with a third reading measuring 0.78.  The patient  was placed on a heparin/nitroglycerin drip in the emergency room  and was  transferred to [facility] for further evaluation.   HOSPITAL COURSE:  [name] was admitted to [facility] on February 19, 2004, for a non-Q wave myocardial infarction.  The patient was  taken to  the cardiac catheterization and underwent right and left heart  catheterization with coronary angiography and left ventriculography.  This  revealed significant two vessel coronary artery disease with a mildly  depressed left ventricular  systolic function.  A surgical consultation was  initiated due to these findings.  Dr. Cornelius Moras of [facility] responded to the  consultation and agreed that the patient would benefit from coronary artery  bypass grafting.  The risks, benefits, and alternatives of the procedure  were discussed with the patient and her family at the time.  The patient  agreed to proceed with surgery.  The patient was scheduled for surgery on  October 26, but when she continued to have persistent chest pain in spite of  IV nitroglycerin, Integrillin, and heparin, the decision was made to take  the patient to the operating room on an urgent basis on February 22, 2004.   The patient underwent coronary artery bypass graft grafting on the morning  of  October 24.  The grafts were as described above.  The greater saphenous  vein was endoscopically harvested from the right thigh.  Overall, the  patient tolerated the procedure well and came off cardiopulmonary bypass in  normal sinus rhythm.  The patient was transferred to the surgical intensive  care unit in critical but stable condition.   Postoperatively, the patient had mild postoperative anemia and was treated  with 3 units of packed red blood cells and a ten pack of adult platelets.  Otherwise, while in the intensive care unit, the patient continued to make  steady progress.  She awoke from anesthesia neurologically intact.  She was  initiated on the standard cardiac surgery protocol and did quite well.  Ms.  Cummings was deemed appropriate for initiation of discharge planning February 25, 2004.  She continued to do quite well and had no specific concerns or  complaints.  She has remained afebrile.  Her vital signs were stable as  follows:  Blood pressure in the 100s to 110s over 60s, pulse in the 70s and  regular, respirations were 18 and unlabored, O2 91-92% on room air.  The  patient's weight was 187 pounds which was up 9 pounds from preoperative  weight.  On physical exam, her heart was in a regular rate and rhythm with  normal sinus rhythm on telemetry.  Her lungs were clear to auscultation.  Her abdomen was soft, nontender, nondistended, with good bowel sounds.  Her  extremities revealed some evidence of mild volume excess although this was  improving.  The patient has resumed a normal diet.  She has resumed normal  bowel and bladder function.  Her incisions are healing well without evidence  of infection.  Her pain was well controlled with oral medications.  The  patient was ambulating in the hallways with one assist and a distance of 740  feet.   DISPOSITION:  [name] is being discharged to home in improved and stable condition.  We will discharge on February 26, 2004,  pending a.m. rounds and  no change in the patient's clinical status.   LABORATORY DATA AT DISCHARGE:  BMP from October 22 with sodium 139,  potassium 3.8, chloride 106, CO2 27, glucose 97, BUN 15, creatinine 1, and  calcium 8.5.  CBC from October 26 reads WBC 10.4, hemoglobin 8.9, hematocrit  25.7, and platelet count 200.  LFTs from October 27 read total bilirubin  0.8, bilirubin 0.2, indirect bilirubin 0.6, alkaline phos 171, AST 33, ALT  26, total protein 5.4, and albumin 2.6.  Chest x-ray from October 26 reads  right basilar and left upper lobe atelectasis, otherwise, no significant  change from prior studies.  There is no evidence of pneumothorax.  The  patient has stable cardiomegaly.   DISCHARGE MEDICATIONS:  1.  Aspirin 325 mg daily.  2.  Atenolol 25 mg daily.  3.  Lisinopril 10 mg daily.  4.  Lasix 40 mg b.i.d. for five more days.  5.  K-Dur 20 mEq twice daily for five more days.  6.  Ultram 50 mg 1-2 tablets every 4-6 hours as needed for pain.   DISCHARGE INSTRUCTIONS:  1.  Activities:  The patient is to avoid driving.  She is to avoid heavy      lifting or strenuous activity.  She should continue to walk daily. She      should continue her breathing exercises for one more week.  2.  Diet:  The patient is to follow a low fat, low salt, heart healthy diet.  3.  Wound care:  The patient may shower.  She should wash her incisions      daily with soap and water.  She should notify the [facility] office if she has      any redness, swelling, or drainage from her incisions, or if she has a      fever greater than 101.   FOLLOW UP:  1.  The patient is to schedule an appointment to see Dr. Nadara Eaton at      [facility] within two weeks of discharge.  She is to call      [phone] to make this appointment date and time.  2.  The patient is scheduled to see Dr. Cornelius Moras on Monday, March 21, 2004,      at 12:45 p.m.  The patient is to go to [facility] one       hour prior to this appointment to obtain a PA and lateral chest x-ray.      The patient is to bring his chest x-ray with him to the appointment with      Dr. Cornelius Moras.       CAF/MEDQ  D:  02/25/2004  T:  02/25/2004  Job:  [id number]   cc:   Cristy Hilts. Jacinto Halim, MD  [address]  [place]  [place] [address]  Fax: [phone]   Ollen Gross, M.D.  [facility]  [address]  [address]  [place]  [place] [address]  Fax: [phone]

## 2010-09-16 NOTE — H&P (Signed)
NAMEBILL, MCVEY                        ACCOUNT NO.:  [id number]   MEDICAL RECORD NO.:  [id number]                   PATIENT TYPE:  INP   LOCATION:  0470                                 FACILITY:  [facility]   PHYSICIAN:  Gus Rankin. Aluisio, M.D.              DATE OF BIRTH:  22-Nov-1931   DATE OF ADMISSION:  01/06/2002  DATE OF DISCHARGE:                                HISTORY & PHYSICAL   CHIEF COMPLAINT:  Right knee pain.   HISTORY OF PRESENT ILLNESS:  The patient is a 77 year old female who is  being seen in consultation by Dr. Ollen Gross for ongoing right knee pain.  She has had a previous left total knee replacement arthroplasty, has done  quite well with this.  She is followed up for her right knee pain which  continues to worsen.  At this time, it is causing a fair amount of pain and  starting to interfere with her daily activities.  She is seen in the office  and found to have severe arthritis of the right knee.  She has reached a  point where she has decided to proceed with surgical intervention due to the  fact that she has done well with the left knee replacement.  Risks and  benefits of the knee replacement have been discussed with the patient.  She  has elected to proceed with surgery.   ALLERGIES:  MULTIPLE NSAIDS.   CURRENT MEDICATIONS:  1. Atenolol/Chloracol 50/25 mg one p.o. q.d.  2. Potassium supplements 10 mEq q.d.  3. Lipitor 10 mg q.d.   PAST MEDICAL HISTORY:  1. Elevated cholesterol.  2. Hypertension.  3. Hemorrhoids.  4. Osteoarthritis.   PAST SURGICAL HISTORY:  1. Left total knee replacement arthroplasty in 6/01.  2. Rectocele surgery in 1983.   SOCIAL HISTORY:  She is widowed, retired, nonsmoker, two drinks of alcohol a  week.  She has two children.  She lives in a two story home.  The master  bedroom is on the main floor.   FAMILY HISTORY:  Sister deceased at age 60 with a history of diabetes.  Mother deceased at age 29 with a history of  Alzheimer's.  Father deceased at  age 72 with a history of heart failure.   REVIEW OF SYMPTOMS:  GENERAL:  No fevers, chills, or night sweats.  NEUROLOGIC:  No seizures, syncope, or paralysis.  RESPIRATORY:  No shortness  of breath, productive cough, or hemoptysis.  CARDIOVASCULAR:  No chest pain,  angina, or orthopnea.  GASTROINTESTINAL:  No nausea, vomiting, diarrhea, or  constipation, no blood or mucus in the stool.  GENITOURINARY:  No dysuria,  hematuria, or discharge.  MUSCULOSKELETAL:  Pertinent to that of the knee  found in the history of present illness.   PHYSICAL EXAMINATION:  VITAL SIGNS:  Pulse 64, respirations 12, blood  pressure 162/88.  GENERAL:  The patient is a 77 year old female, well-developed, well-  nourished, appears to be in no acute distress.  She is alert, cooperative,  and oriented, and pleasant at the time of examination.  HEENT:  Normocephalic, atraumatic.  Pupils are round and reactive.  Extraocular movements were intact.  Oropharynx is clear.  NECK:  Supple.  CHEST:  Clear to auscultation anterior and posterior chest walls.  No  wheezes, rhonchi, or rales.  HEART:  Regular rate and rhythm, no murmurs.  S1 and S2 noted.  ABDOMEN:  Soft, slightly round, nontender, bowel sounds are present.  No  rebound or guarding.  RECTAL:  Not done, not pertinent to present illness.  BREASTS:  Not done, not pertinent to present illness.  GENITALIA:  Not done, not pertinent to present illness.  EXTREMITIES:  Significant to the right lower extremity:  She has significant  varus deformity range of 5 degrees up to 140 degrees.  She has marked  crepitus noted on passive range of motion.  There is no instability noted.   IMPRESSION:  1. Osteoarthritis, right knee.  2. Hypertension.  3. Elevated cholesterol.  4. Hemorrhoids.  5. Status post left total knee replacement arthroplasty.   PLAN:  The patient will be admitted to [facility] to undergo a  right total  knee replacement arthroplasty.  Surgery will be performed by Dr.  Ollen Gross.  The patient's medical physician is Dr. Merri Brunette.  Dr.  Katrinka Blazing will be notified of the room number on admission, and be consulted if  needed for any medical assistance with this patient throughout the hospital  course.      Alexzandrew L. Whitt[place], [place]                Gus Rankin Aluisio, M.D.    ALP/MEDQ  D:  01/08/2002  T:  01/09/2002  Job:  [id number]

## 2010-09-16 NOTE — Consult Note (Signed)
NAMEGIRTRUDE, [name]              ACCOUNT NO.:  [id number]   MEDICAL RECORD NO.:  [id number]          PATIENT TYPE:  INP   LOCATION:  2024                         FACILITY:  [facility]   PHYSICIAN:  Salvatore Decent. Cornelius Moras, M.D. DATE OF BIRTH:  1932/03/26   DATE OF CONSULTATION:  02/19/2004  DATE OF DISCHARGE:                                   CONSULTATION   REQUESTING PHYSICIAN:  Dr. Lenise Herald   PRIMARY CARE PHYSICIAN:  Dr. Merri Brunette.   REASON FOR CONSULTATION:  Severe two-vessel coronary artery disease, status  post acute non-Q-wave myocardial infarction.   HISTORY OF PRESENT ILLNESS:  [name] is a 77 year old widowed retired  white female from [place] with no previous cardiac history but with risk  factors notable for a history of hypertension and hyperlipidemia.  The  patient has remained physically active and remarkably young for her age.  She reports that she first developed a few transient episodes of substernal  chest pressure over the last 2 to 3 days prior to admission.  Yesterday  evening while packing her suitcase for a cruise she developed a more severe  episode of substernal chest pressure that radiated to both shoulders.  This  pain persisted, ultimately prompting her to present to the emergency room.  Following arrival in the emergency room and treatment with nitroglycerin,  her pain resolved.  Baseline electrocardiogram revealed normal sinus rhythm  with no significant ST segment elevation or depression; however, the patient  has ruled in for acute myocardial infarction by serial cardiac enzymes.  The  patient was taken to the cardiac cath lab today by Dr. Jenne Campus.  This  demonstrates severe two-vessel coronary artery disease with coronary anatomy  unfavorable for percutaneous coronary intervention.  There is moderate left  ventricular dysfunction.  Cardiac surgical consultation has been requested.   REVIEW OF SYSTEMS:  GENERAL:  The patient reports feeling  well otherwise.  She has good appetite and has not been gaining or losing weight.  CARDIAC:  The patient reports no previous history of substernal chest pressure or  chest tightness either with exertion or at rest other than that which  developed over the last few days.  She has been active physically and  reports no dyspnea on exertion, no resting shortness of breath, no PND,  orthopnea, nor lower extremity edema.  She denies any palpitations or  syncope.  RESPIRATORY:  Negative.  The patient denies productive cough,  hemoptysis, wheezing.  GASTROINTESTINAL:  Negative.  The patient has only  occasional episodes of mild symptoms of reflux.  She reports normal bowel  function and she denies hematemesis, hematochezia, melena.  NEUROLOGICAL:  Negative.  The patient denies symptoms suggestive of TIA or previous stroke.  MUSCULOSKELETAL:  Notable for some problems with her knees in the past  although she is doing quite well now having undergone bilateral total knee  replacement.  GENITOURINARY:  Negative.  The patient does report symptoms of  urinary urgency when she takes nonsteroidal anti-inflammatory medications  She denies recent urinary urgency, frequency or hematuria.  INFECTIOUS:  Negative.  HEENT:  Negative.  PSYCHIATRIC:  Negative.   PAST MEDICAL HISTORY:  1.  Hypertension.  2.  Hyperlipidemia.  3.  Degenerative joint disease affecting both knees.   FAMILY HISTORY:  Notable for the absence of significant coronary artery  disease although the patient's father died at age 61 of congestive heart  failure.   PAST SURGICAL HISTORY:  The patient underwent left total knee replacement in  June 2001 and right total knee replacement in September 2003.  The patient  had a rectocele repair in 1983.  She has had a stress fracture in her right  foot and she has had transmetatarsal arthrodesis on the right side.   SOCIAL HISTORY:  The patient is widowed and lives alone here in [place].  She  moved to [place] 5 years ago from [place].  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  She  has 2 grown children and several grandchildren.  She is a nonsmoker and  reports only occasional alcohol consumption.   MEDICATIONS PRIOR TO ADMISSION:  1.  Atenolol 100 mg daily.  2.  Lisinopril 10 mg daily.   DRUG ALLERGIES:  None known.  The patient does have INTOLERANCE to MOST  STATIN DRUGS which she had tried on several occasional for treatment of  hyperlipidemia.  The patient DOES NOT TOLERATE NONSTEROIDAL ANTI-  INFLAMMATORY AGENTS very well either.   PHYSICAL EXAMINATION:  GENERAL:  Notable for a well-appearing female who  appears somewhat younger than stated age in no acute distress.  VITAL SIGNS:  She is currently afebrile and normotensive.  HEENT:  Grossly unrevealing.  NECK:  Supple.  There is no cervical nor supraclavicular lymphadenopathy.  There is no jugular venous distention.  No carotid bruits are noted.  CHEST:  Auscultation of the chest reveals clear and symmetrical breath  sounds bilaterally.  No wheezes or rhonchi are noted.  CARDIOVASCULAR:  Notable for a regular rate and rhythm.  No murmurs, rubs,  or gallops are noted.  ABDOMEN:  Soft and nontender.  There are no palpable masses.  Bowel sounds  are present.  EXTREMITIES:  Warm and well perfused.  There is no lower extremity edema.  Distal pulses are palpable in both lower legs at the ankles in the posterior  tibial position.  There is no sign of significant venous insufficiency.  SKIN:  Clean and dry and healthy-appearing throughout.  RECTAL AND GENITOURINARY:  Both deferred.  NEUROLOGICAL:  Grossly nonfocal.   LABORATORY DATA:  Baseline blood count demonstrates white blood count of  6900 with hemoglobin 14.4, hematocrit 45.5%, platelet count 328,000.  Serum  electrolytes are within normal limits and baseline creatinine 1.2 prior to catheterization.    DIAGNOSTIC TESTS:  Cardiac catheterization performed today by Dr. Jenne Campus is  reviewed.  This demonstrates severe two-vessel coronary artery disease with  moderate left ventricular dysfunction.  Specifically, there is high-grade  99% stenosis of the proximal left anterior descending coronary artery  arising and involving at the takeoff of the first diagonal branch.  There is  long segment 70 to 80% stenosis of the mid left anterior descending coronary  artery beyond the takeoff of the diagonal and the first septal perforating  branch.  There is long segment 80% proximal stenosis of a large ramus  intermediate branch.  There is insignificant disease involving the remainder  of the left circumflex system.  There are luminal irregularities in the  proximal right coronary artery with perhaps 30 to 40% proximal stenosis at  most.  Left ventricular function is moderately reduced with severe  anteroapical hypokinesis and apical akinesis.  Ejection fraction is  estimated 40%.   IMPRESSION:  Severe two-vessel coronary artery disease status post acute  myocardial infarction with coronary anatomy unfavorable for percutaneous  coronary intervention.  I believe that [name] would best be treated by  elective coronary artery bypass grafting.   PLAN:  I have outlined options at length for [name].  Alternative  treatment strategies have been reviewed in detail.  She understands and  accept all associated risks of surgery including but not limited to risks of  death, stroke, myocardial infarction, congestive heart failure, respiratory  failure, pneumonia, bleeding requiring blood transfusion, arrhythmia,  infection, and recurrent coronary artery disease.  All of her questions have  been addressed.  We tentatively plan to proceed with surgery early next  week.  We will proceed with surgery sooner should she develop unstable  symptoms of post infarction angina despite intervenous heparin and   nitroglycerin.       CHO/MEDQ  D:  02/19/2004  T:  02/19/2004  Job:  [id number]   cc:   Darlin Priestly, MD  [phone] [address]  [place]  [place] [address]  Fax: [phone]   Cristy Hilts. Jacinto Halim, MD  [address]  [place]  [place] [address]  Fax: [phone]   Dario Guardian, M.D.  [address]  [place]  [place] [address]  Fax: [phone]

## 2010-09-16 NOTE — Op Note (Signed)
[name], [name]              ACCOUNT NO.:  [id number]   MEDICAL RECORD NO.:  [id number]          PATIENT TYPE:  INP   LOCATION:  2306                         FACILITY:  [facility]   PHYSICIAN:  Salvatore Decent. Cornelius Moras, M.D. DATE OF BIRTH:  June 29, 1931   DATE OF PROCEDURE:  02/22/2004  DATE OF DISCHARGE:                                 OPERATIVE REPORT   PREOPERATIVE DIAGNOSIS:  Severe two-vessel coronary artery disease with  class IV postinfarction angina.   POSTOPERATIVE DIAGNOSIS:  Severe two-vessel coronary artery disease with  class IV postinfarction angina.   PROCEDURE:  Median sternotomy for coronary artery bypass grafting x3 (left  internal mammary artery to distal left anterior descending coronary artery,  saphenous vein graft to first diagonal branch, saphenous vein graft to ramus  intermediate branch, endoscopic saphenous vein harvest from right thigh).   SURGEON:  Salvatore Decent. Cornelius Moras, M.D.   ASSISTANT:  Evelene Croon, M.D.   SECOND ASSISTANT:  Carmin Muskrat. Eustaquio Boyden.   ANESTHESIA:  General.   BRIEF CLINICAL NOTE:  The patient is a 77 year old female with no previous  cardiac history but risk factors notable for a history of hypertension and  hyperlipidemia.  The patient was admitted on October 21 with an acute non-Q-  wave myocardial infarction.  She underwent cardiac catheterization by Darlin Priestly, MD, demonstrating severe two-vessel coronary artery disease with  coronary anatomy unfavorable for percutaneous coronary intervention.  A full  consultation note has been dictated previously.   OPERATIVE CONSENT:  The patient has been counseled at length regarding the  indications, risks, and potential benefits of coronary artery bypass  grafting.  Alternative treatment strategies have been discussed.  She  understands and accepts all associated risks and desires to proceed as  described.   OPERATIVE NOTE IN DETAIL:  The patient is brought on an urgent basis to the  operating room on the morning of February 22, 2004.  During the intervening  48 hours, the patient has had recurrent symptoms of unstable angina despite  escalating doses of intravenous nitroglycerin, heparin, and Integrilin.  She  is brought to the operating room and central monitoring is established by  the anesthesia service under the care and direction of Quita Skye. Krista Blue, M.D.  Specifically, a Swan-Ganz catheter is placed through the right internal  jugular approach.  A radial arterial line is placed.  Intravenous  antibiotics are administered.  Following induction with general endotracheal  anesthesia, a Foley catheter is placed.  The patient's chest, abdomen, both  groins, both lower extremities are prepared and draped in a sterile manner.   A median sternotomy incision is performed and the left internal mammary  artery is dissected from the chest wall and prepared for bypass grafting.  The left internal mammary artery is good-quality conduit although slightly  small-caliber.  Simultaneously saphenous vein is obtained from the patient's  right thigh using endoscopic vein harvest technique.  Initially a small  incision is made just above the left knee, but the saphenous vein on this  side appears to be small-caliber.  Subsequently saphenous vein is obtained  from  the right thigh through a small incision made just above the right  knee.  The saphenous vein is removed endoscopically.  It is slightly small-  caliber but otherwise is good-quality conduit.  The patient is heparinized  systemically.  After the saphenous vein is removed from the right thigh, all  surgical incisions in both legs are closed in multiple layers with running  absorbable suture.   The pericardium is opened.  The ascending aorta is normal in appearance.  The ascending aorta and the right atrium are cannulated for cardiopulmonary  bypass.  Adequate heparinization is verified. Cardiopulmonary bypass is  begun.  The  surface of the heart is inspected.  The sites are selected for  coronary bypass grafting.  Portions of the saphenous vein and the left  internal mammary artery are trimmed to appropriate lengths.  A temperature  probe is placed in the left ventricular septum.  A cardioplegia catheter is  placed in the ascending aorta.   The patient is allowed to cool passively to 32 degrees systemic temperature.  The aortic crossclamp is applied and cardioplegia is delivered in an  antegrade fashion through the aortic root.  Iced saline slush is applied for  topical hypothermia.  The initial cardioplegic arrest and myocardial cooling  are felt to be satisfactory.  Repeat doses of cardioplegia are administered  intermittently throughout the crossclamp portion of the operation both  through the aortic root and own the subsequently-placed vein grafts to  maintain septal temperature less than 15 degrees Centigrade.  The following  distal coronary anastomoses are performed:  (1) The ramus intermediate  branch is grafted with a saphenous vein graft in an end-to-side fashion.  This coronary measures 1.2 mm in diameter and is of good quality at the site  of distal bypass.  (2) The first diagonal branch off the left anterior  descending coronary artery is grafted with a saphenous vein graft in an end-  to-side fashion.  This coronary measures 1.0 mm in diameter at the site of  distal bypass and is a fair to good-quality target.  (3) The distal left  anterior descending coronary artery is grafted with the left internal  mammary artery in an end-to-side fashion.  This coronary measures 1.5 mm in  diameter and is of good quality at the site of distal bypass.   Both proximal saphenous vein anastomoses are performed directly to the  ascending aorta prior to removal of the aortic crossclamp.  The left  ventricular temperature is noted to rise rapidly with reperfusion of the left internal mammary artery.  The aortic  crossclamp is removed after a  total crossclamp time of 55 minutes.   The heart begins to beat spontaneously without need for cardioversion.  All  proximal and distal anastomoses are inspected for hemostasis and appropriate  graft orientation.  Epicardial pacing wires are affixed to the right  ventricular outflow tract and to the right atrial appendage.  The patient is  rewarmed to 37 degrees Centigrade temperature.  The patient is weaned from  cardiopulmonary bypass without difficulty.  The patient's rhythm at  separation from bypass is normal sinus rhythm.  No inotropic support is  required.  Total cardiopulmonary bypass time for the operation is 80  minutes.   The venous and arterial cannulae are removed uneventfully.  Protamine is  administered to reverse the anticoagulation.  The mediastinum and the left  chest are irrigated with saline solution containing vancomycin.  Meticulous  surgical hemostasis is ascertained.  The mediastinum and the left chest are  drained with three chest tubes placed through separate stab incisions  inferiorly.  The median sternotomy is closed in routine fashion.  The soft  tissues anterior to the sternum are closed in multiple layers, and the skin  is closed with a running subcuticular skin closure.   The patient tolerated the procedure well and is transported to the surgical  intensive care unit in stable condition.  There are no intraoperative  complications.  All sponge, instrument, and needle counts are verified  correct at  completion of the operation.  The patient was transfused two units of packed  red blood cells during cardiopulmonary bypass due to anemia.  The patient  was transfused one 10-pack of adult platelets after separation from bypass  due to coagulopathy.       CHO/MEDQ  D:  02/22/2004  T:  02/22/2004  Job:  [id number]   cc:   Darlin Priestly, MD  [address]  [place]  [place] [address]  Fax: [phone]   Cristy Hilts.  Jacinto Halim, MD  [address]  [place]  [place] [address]  Fax: [phone]   Dario Guardian, M.D.  [address]  [place]  [place] [address]  Fax: [phone]

## 2010-09-16 NOTE — Cardiovascular Report (Signed)
[name], [name]              ACCOUNT NO.:  [id number]   MEDICAL RECORD NO.:  [id number]          PATIENT TYPE:  INP   LOCATION:  2024                         FACILITY:  [facility]   PHYSICIAN:  Darlin Priestly, MD  DATE OF BIRTH:  July 17, 1931   DATE OF PROCEDURE:  02/19/2004  DATE OF DISCHARGE:                              CARDIAC CATHETERIZATION   PROCEDURES:  1.  Left heart catheterization.  2.  Coronary angiography.  3.  Left ventriculography.   ATTENDING:  Darlin Priestly, M.D.   COMPLICATIONS:  None.   INDICATION:  Ms. [name] is a 77 year old female with a history of borderline  hypertension, history of hyperlipidemia, intolerant of statins, very active,  who normally exercises anywhere from 5-8 hours a week.  Over the last 2-3  days, the patient has noticed mild substernal chest pain while doing her  exercise.  On the evening of February 18, 2004, the patient developed  recurrent chest pain and ultimately presented to [facility] ER at 3 o'clock  in the morning on February 19, 2004.  The patient, at that time, had  anterolateral ST segment depression and ultimately ruled in for MI.  The  patient is now referred for cardiac catheterization to rule out significant  CAD.   DESCRIPTION OF OPERATION:  After giving written informed consent, the  patient was brought to the cardiac cath lab and right and left groins were  shaved, prepped and draped in the usual sterile fashion.  ECG monitoring was  established.  Using a modified Seldinger technique, a #6-French sheath was  inserted in the right femoral artery.  The 6-French diagnostic catheters  were then used to perform diagnostic angiography.   Left main is a large vessel with no significant disease.   The LAD is a large vessel that courses to the apex and gives rise to 2  diagonal branches.  There is mild calcification in the proximal portion of  the LAD with 30% proximal LAD narrowing.  There is diffuse 99% stenosis in  the mid LAD crossing over a large septal perforator and a large first  diagonal.  The diagonal also has a 99% ostial lesion.  There is 70% mid LAD  disease.  The remainder of the LAD has no significant disease.   The first diagonal is a medium-sized vessel with 99% ostial lesion.  The  second diagonal is a small vessel with no significant disease.   The left coronary artery also gives rise to a medium-sized ramus  intermedius.  This vessel had 80% proximal disease back to the left main and  involving the upper and lower bifurcation.   The left circumflex is a medium-sized vessel coursing to the AV groove and  gives rise to 2 obtuse marginal branches.  The AV groove circumflex has no  significant disease.   The first OM is a small vessel with no significant disease.   The second OM is a medium-sized vessel that bifurcates in the mid-segment  with no significant disease.   The right coronary artery is a large vessel that is dominant and gives rise  to a PDA as well as posterolateral branch.  There is mild 40% mid-vessel RCA  narrowing.  The remainder of the RCA, PDA and posterolateral branch have no  significant disease.   LEFT VENTRICULOGRAM:  Left ventriculogram reveals a moderately depressed EF  of 35% to 40%.  There is anterolateral hypokinesis with apical akinesis.   HEMODYNAMICS:  Systemic arterial pressure 120/62, LV systolic pressure  120/20, LVEDP of 32.   CONCLUSIONS:  1.  Significant two-vessel coronary artery disease.  2.  Mildly depressed left ventricular systolic function with wall motion      abnormalities as noted above.  3.  Elevated left ventricular end-diastolic pressure.      Robe   RHM/MEDQ  D:  02/19/2004  T:  02/19/2004  Job:  [id number]

## 2010-09-16 NOTE — H&P (Signed)
[facility]  Patient:    [name], [name] Visit Number: [id number] MRN: [id number]          Service Type: Attending:  Leonides Grills Dictated by:   Druscilla Brownie. Underwood III, P.A.-C. Adm. Date:  01/02/01   CC:         Dario Guardian, M.D.   History and Physical  DATE OF BIRTH:  01/04/1932  CHIEF COMPLAINT:  Problems with my right foot.  HISTORY OF PRESENT ILLNESS:  This 77 year old lady who has been seen by Dr. Lestine Box for continuing and progressive problems concerning her right foot. She has tried water toe blocked shoes, as well as Celebrex, and other anti-inflammatories with little relief concerning this foot.  She can only walk a short distance before she has increasing pain into her foot.  She has callus formation under the second metatarsal of the foot on the plantar surface.  She also has a marked hallux valgus with a hypermobile hallux.  Due to the fact that she cannot find adequate shoe apparel to provide her any comfort in walking, as well as padded shoes, it was felt she would benefit from surgical intervention, and is being admitted for right first tarsal metatarsal arthrodesis, modified McBride bunionectomy, possible Akin osteotomy.  Also plan to use local bone graft, and excise the dorsal spur with the medial cuneiform and base of the first metatarsal.  PAST MEDICAL HISTORY:  This lady has been in relatively good health throughout her lifetime.  She had repair of a rectocele in 1983, and total knee replacement by Dr. Lequita Halt in June 2001.  Medically, her only problem is hypertension.  Her family physician is Dr. Merri Brunette.  CURRENT MEDICATIONS: 1. Atenolol. 2. Chlorathol 50/25 mg one q.d. 3. Chlorcod 10 mEq one q.d. 4. Celebrex 100 mg one b.i.d.  ALLERGIES:  DAYPRO, which causes a rash.  VIOXX, NAPROXEN, BEXTRA, affects urination with frequency.  SOCIAL HISTORY:  The patient may have two alcoholic beverages a week.   No tobacco products.  FAMILY HISTORY:  Positive for diabetes in a sister and grandmother.  REVIEW OF SYSTEMS:  CNS:  No seizure disorder, paralysis, numbness, or double vision.  RESPIRATORY:  No productive cough, no hemoptysis, no shortness of breath.  CARDIOVASCULAR:  No chest pain, no angina, no orthopnea.  GASTROINTESTINAL:  No nausea, vomiting, melena, or bloody stool.  GENITOURINARY:  No discharge, dysuria, hematuria.  MUSCULOSKELETAL:  Primarily in present illness.  PHYSICAL EXAMINATION:  GENERAL:  Alert, cooperative, friendly 77 year old female looking younger than her stated age.  VITAL SIGNS:  Blood pressure 132/74, pulse 80, respiratory rate 12.  HEENT:  Normocephalic, pupils are equal, round and reactive to light and accommodation, extraocular movements intact.  Oropharynx is clear.  CHEST:  Clear to auscultation, no rhonchi, no rales.  HEART:  Regular rate and rhythm, no murmurs are heard.  ABDOMEN:  Soft, nontender, liver and spleen not felt.  GENITALIA:  Not done, not pertinent to present illness.  RECTAL:  Not done, not pertinent to present illness.  PELVIC:  Not done, not pertinent to present illness.  BREASTS:  Not done, not pertinent to present illness.  EXTREMITIES:  Right foot as in present illness above.  ADMISSION DIAGNOSES: 1. Hypermobile hallux valgus, right hallux of the interphalangeals. 2. Hypertension. 3. History of hypokalemia in the past.  PLAN:  The patient will undergo right first tarsal metatarsal arthrodesis, modified McBride bunionectomy.  Possible Aken osteotomy with local bone graft, excision of dorsal  spur of the medial cuneiform and base of the first metatarsal.  Should we have any medical problems, we will certainly ask Dr. Merri Brunette to follow along with [place] during this patients hospitalization. Dictated by:   Druscilla Brownie. Underwood III, P.A.-C. Attending:  Leonides Grills DD:  12/26/00 TD:  12/26/00 Job:  [id number]

## 2011-01-30 LAB — BASIC METABOLIC PANEL
Calcium: 10.3
GFR calc Af Amer: 46 — ABNORMAL LOW
GFR calc non Af Amer: 38 — ABNORMAL LOW
Glucose, Bld: 201 — ABNORMAL HIGH
Potassium: 3.5
Sodium: 143

## 2011-07-27 DIAGNOSIS — L84 Corns and callosities: Secondary | ICD-10-CM | POA: Diagnosis not present

## 2011-08-16 DIAGNOSIS — I10 Essential (primary) hypertension: Secondary | ICD-10-CM | POA: Diagnosis not present

## 2011-08-16 DIAGNOSIS — Z951 Presence of aortocoronary bypass graft: Secondary | ICD-10-CM | POA: Diagnosis not present

## 2011-08-16 DIAGNOSIS — I2510 Atherosclerotic heart disease of native coronary artery without angina pectoris: Secondary | ICD-10-CM | POA: Diagnosis not present

## 2011-08-31 DIAGNOSIS — M76899 Other specified enthesopathies of unspecified lower limb, excluding foot: Secondary | ICD-10-CM | POA: Diagnosis not present

## 2011-09-27 DIAGNOSIS — H4011X0 Primary open-angle glaucoma, stage unspecified: Secondary | ICD-10-CM | POA: Diagnosis not present

## 2011-09-27 DIAGNOSIS — H409 Unspecified glaucoma: Secondary | ICD-10-CM | POA: Diagnosis not present

## 2011-10-18 DIAGNOSIS — H409 Unspecified glaucoma: Secondary | ICD-10-CM | POA: Diagnosis not present

## 2011-10-18 DIAGNOSIS — H4011X0 Primary open-angle glaucoma, stage unspecified: Secondary | ICD-10-CM | POA: Diagnosis not present

## 2011-11-21 DIAGNOSIS — S0010XA Contusion of unspecified eyelid and periocular area, initial encounter: Secondary | ICD-10-CM | POA: Diagnosis not present

## 2012-02-15 DIAGNOSIS — H4011X0 Primary open-angle glaucoma, stage unspecified: Secondary | ICD-10-CM | POA: Diagnosis not present

## 2012-02-15 DIAGNOSIS — Z961 Presence of intraocular lens: Secondary | ICD-10-CM | POA: Diagnosis not present

## 2012-02-15 DIAGNOSIS — H409 Unspecified glaucoma: Secondary | ICD-10-CM | POA: Diagnosis not present

## 2012-02-19 DIAGNOSIS — I10 Essential (primary) hypertension: Secondary | ICD-10-CM | POA: Diagnosis not present

## 2012-02-19 DIAGNOSIS — E559 Vitamin D deficiency, unspecified: Secondary | ICD-10-CM | POA: Diagnosis not present

## 2012-02-19 DIAGNOSIS — Z1211 Encounter for screening for malignant neoplasm of colon: Secondary | ICD-10-CM | POA: Diagnosis not present

## 2012-02-19 DIAGNOSIS — Z23 Encounter for immunization: Secondary | ICD-10-CM | POA: Diagnosis not present

## 2012-02-19 DIAGNOSIS — I2510 Atherosclerotic heart disease of native coronary artery without angina pectoris: Secondary | ICD-10-CM | POA: Diagnosis not present

## 2012-02-19 DIAGNOSIS — Z124 Encounter for screening for malignant neoplasm of cervix: Secondary | ICD-10-CM | POA: Diagnosis not present

## 2012-02-19 DIAGNOSIS — Z1331 Encounter for screening for depression: Secondary | ICD-10-CM | POA: Diagnosis not present

## 2012-02-19 DIAGNOSIS — Z0000 Encounter for general adult medical examination without abnormal findings: Secondary | ICD-10-CM | POA: Diagnosis not present

## 2012-02-19 DIAGNOSIS — E7800 Pure hypercholesterolemia, unspecified: Secondary | ICD-10-CM | POA: Diagnosis not present

## 2012-02-28 DIAGNOSIS — Z1231 Encounter for screening mammogram for malignant neoplasm of breast: Secondary | ICD-10-CM | POA: Diagnosis not present

## 2012-03-06 DIAGNOSIS — L723 Sebaceous cyst: Secondary | ICD-10-CM | POA: Diagnosis not present

## 2012-03-06 DIAGNOSIS — D239 Other benign neoplasm of skin, unspecified: Secondary | ICD-10-CM | POA: Diagnosis not present

## 2012-03-06 DIAGNOSIS — L259 Unspecified contact dermatitis, unspecified cause: Secondary | ICD-10-CM | POA: Diagnosis not present

## 2012-03-06 DIAGNOSIS — D0470 Carcinoma in situ of skin of unspecified lower limb, including hip: Secondary | ICD-10-CM | POA: Diagnosis not present

## 2012-03-06 DIAGNOSIS — L570 Actinic keratosis: Secondary | ICD-10-CM | POA: Diagnosis not present

## 2012-03-06 DIAGNOSIS — D485 Neoplasm of uncertain behavior of skin: Secondary | ICD-10-CM | POA: Diagnosis not present

## 2012-03-06 DIAGNOSIS — L821 Other seborrheic keratosis: Secondary | ICD-10-CM | POA: Diagnosis not present

## 2012-03-06 DIAGNOSIS — Z85828 Personal history of other malignant neoplasm of skin: Secondary | ICD-10-CM | POA: Diagnosis not present

## 2012-03-06 DIAGNOSIS — L719 Rosacea, unspecified: Secondary | ICD-10-CM | POA: Diagnosis not present

## 2012-03-07 DIAGNOSIS — L84 Corns and callosities: Secondary | ICD-10-CM | POA: Diagnosis not present

## 2012-04-15 DIAGNOSIS — D0470 Carcinoma in situ of skin of unspecified lower limb, including hip: Secondary | ICD-10-CM | POA: Diagnosis not present

## 2012-04-15 DIAGNOSIS — L570 Actinic keratosis: Secondary | ICD-10-CM | POA: Diagnosis not present

## 2012-05-06 DIAGNOSIS — E559 Vitamin D deficiency, unspecified: Secondary | ICD-10-CM | POA: Diagnosis not present

## 2012-05-13 DIAGNOSIS — I2510 Atherosclerotic heart disease of native coronary artery without angina pectoris: Secondary | ICD-10-CM | POA: Diagnosis not present

## 2012-05-13 DIAGNOSIS — I10 Essential (primary) hypertension: Secondary | ICD-10-CM | POA: Diagnosis not present

## 2012-05-13 DIAGNOSIS — Z951 Presence of aortocoronary bypass graft: Secondary | ICD-10-CM | POA: Diagnosis not present

## 2012-07-08 DIAGNOSIS — L84 Corns and callosities: Secondary | ICD-10-CM | POA: Diagnosis not present

## 2012-08-14 DIAGNOSIS — H4011X0 Primary open-angle glaucoma, stage unspecified: Secondary | ICD-10-CM | POA: Diagnosis not present

## 2012-08-14 DIAGNOSIS — H409 Unspecified glaucoma: Secondary | ICD-10-CM | POA: Diagnosis not present

## 2012-11-14 DIAGNOSIS — L84 Corns and callosities: Secondary | ICD-10-CM | POA: Diagnosis not present

## 2012-12-14 ENCOUNTER — Other Ambulatory Visit: Payer: Self-pay | Admitting: Cardiology

## 2012-12-16 NOTE — Telephone Encounter (Signed)
Rx was sent to pharmacy electronically. 

## 2013-01-31 DIAGNOSIS — R609 Edema, unspecified: Secondary | ICD-10-CM | POA: Diagnosis not present

## 2013-02-12 DIAGNOSIS — H4011X0 Primary open-angle glaucoma, stage unspecified: Secondary | ICD-10-CM | POA: Diagnosis not present

## 2013-02-12 DIAGNOSIS — H409 Unspecified glaucoma: Secondary | ICD-10-CM | POA: Diagnosis not present

## 2013-02-12 DIAGNOSIS — Z961 Presence of intraocular lens: Secondary | ICD-10-CM | POA: Diagnosis not present

## 2013-02-16 DIAGNOSIS — Z23 Encounter for immunization: Secondary | ICD-10-CM | POA: Diagnosis not present

## 2013-03-05 DIAGNOSIS — Z1231 Encounter for screening mammogram for malignant neoplasm of breast: Secondary | ICD-10-CM | POA: Diagnosis not present

## 2013-03-18 ENCOUNTER — Other Ambulatory Visit: Payer: Self-pay | Admitting: Cardiology

## 2013-03-18 NOTE — Telephone Encounter (Signed)
Rx was sent to pharmacy electronically. 

## 2013-03-25 DIAGNOSIS — Z0000 Encounter for general adult medical examination without abnormal findings: Secondary | ICD-10-CM | POA: Diagnosis not present

## 2013-03-25 DIAGNOSIS — I10 Essential (primary) hypertension: Secondary | ICD-10-CM | POA: Diagnosis not present

## 2013-03-25 DIAGNOSIS — Z1211 Encounter for screening for malignant neoplasm of colon: Secondary | ICD-10-CM | POA: Diagnosis not present

## 2013-03-25 DIAGNOSIS — Z1331 Encounter for screening for depression: Secondary | ICD-10-CM | POA: Diagnosis not present

## 2013-03-25 DIAGNOSIS — E7800 Pure hypercholesterolemia, unspecified: Secondary | ICD-10-CM | POA: Diagnosis not present

## 2013-03-25 DIAGNOSIS — I2510 Atherosclerotic heart disease of native coronary artery without angina pectoris: Secondary | ICD-10-CM | POA: Diagnosis not present

## 2013-03-31 DIAGNOSIS — L719 Rosacea, unspecified: Secondary | ICD-10-CM | POA: Diagnosis not present

## 2013-03-31 DIAGNOSIS — D239 Other benign neoplasm of skin, unspecified: Secondary | ICD-10-CM | POA: Diagnosis not present

## 2013-03-31 DIAGNOSIS — Z85828 Personal history of other malignant neoplasm of skin: Secondary | ICD-10-CM | POA: Diagnosis not present

## 2013-03-31 DIAGNOSIS — L821 Other seborrheic keratosis: Secondary | ICD-10-CM | POA: Diagnosis not present

## 2013-03-31 DIAGNOSIS — L570 Actinic keratosis: Secondary | ICD-10-CM | POA: Diagnosis not present

## 2013-03-31 DIAGNOSIS — L259 Unspecified contact dermatitis, unspecified cause: Secondary | ICD-10-CM | POA: Diagnosis not present

## 2013-03-31 DIAGNOSIS — Z411 Encounter for cosmetic surgery: Secondary | ICD-10-CM | POA: Diagnosis not present

## 2013-05-09 ENCOUNTER — Encounter: Payer: Self-pay | Admitting: Cardiology

## 2013-05-12 ENCOUNTER — Encounter: Payer: Self-pay | Admitting: Cardiology

## 2013-05-12 ENCOUNTER — Encounter (HOSPITAL_COMMUNITY): Payer: Self-pay | Admitting: *Deleted

## 2013-05-12 ENCOUNTER — Ambulatory Visit (INDEPENDENT_AMBULATORY_CARE_PROVIDER_SITE_OTHER): Payer: Medicare Other | Admitting: Cardiology

## 2013-05-12 VITALS — BP 150/80 | HR 70 | Ht 66.5 in | Wt 144.2 lb

## 2013-05-12 DIAGNOSIS — Z79899 Other long term (current) drug therapy: Secondary | ICD-10-CM | POA: Diagnosis not present

## 2013-05-12 DIAGNOSIS — Z789 Other specified health status: Secondary | ICD-10-CM

## 2013-05-12 DIAGNOSIS — I2510 Atherosclerotic heart disease of native coronary artery without angina pectoris: Secondary | ICD-10-CM

## 2013-05-12 DIAGNOSIS — E785 Hyperlipidemia, unspecified: Secondary | ICD-10-CM | POA: Diagnosis not present

## 2013-05-12 DIAGNOSIS — I10 Essential (primary) hypertension: Secondary | ICD-10-CM

## 2013-05-12 DIAGNOSIS — I214 Non-ST elevation (NSTEMI) myocardial infarction: Secondary | ICD-10-CM | POA: Diagnosis not present

## 2013-05-12 DIAGNOSIS — Z951 Presence of aortocoronary bypass graft: Secondary | ICD-10-CM | POA: Diagnosis not present

## 2013-05-12 DIAGNOSIS — I25810 Atherosclerosis of coronary artery bypass graft(s) without angina pectoris: Secondary | ICD-10-CM | POA: Diagnosis not present

## 2013-05-12 MED ORDER — PITAVASTATIN CALCIUM 1 MG PO TABS: ORAL_TABLET | ORAL | Status: DC

## 2013-05-12 NOTE — Patient Instructions (Signed)
Your physician has requested that you have en exercise stress myoview. For further information please visit [email or web address]. Please follow instruction sheet, as given.  Labs in 6 month  CMP,LIPIDS  Start Livalo  1mg  take 1/2 tablet once a week for 1 month ,then 1/2 tablet twice a week.  Your physician wants you to follow-up in 12 months Dr Ellyn Hack.  You will receive a reminder letter in the mail two months in advance. If you don't receive a letter, please call our office to schedule the follow-up appointment.

## 2013-05-14 ENCOUNTER — Encounter: Payer: Self-pay | Admitting: Cardiology

## 2013-05-14 DIAGNOSIS — Z789 Other specified health status: Secondary | ICD-10-CM | POA: Insufficient documentation

## 2013-05-14 DIAGNOSIS — I10 Essential (primary) hypertension: Secondary | ICD-10-CM | POA: Insufficient documentation

## 2013-05-14 DIAGNOSIS — E785 Hyperlipidemia, unspecified: Secondary | ICD-10-CM | POA: Insufficient documentation

## 2013-05-14 NOTE — Assessment & Plan Note (Signed)
With traction Livalo, if this does not work, I will refer her to the lipid clinic at the [facility] office.

## 2013-05-14 NOTE — Assessment & Plan Note (Signed)
Whole was and inferior lateral defect noted on her Myoview prior to her last catheterization, I wonder this could be an infarct from occlusion of the ramus graft. Otherwise her EF was improved and almost back to normal by a 2008 Myoview.

## 2013-05-14 NOTE — Assessment & Plan Note (Addendum)
Not very good control of her lipids. We have really been limited to Wellbutrin use. I think her primary care provider has given up further management and deferred to me based on the last note I saw. I had a long talk with her about the importance of cholesterol control, we came to the conclusion that we would try Livalo 1 mg tablets we will start off taking a half a tablet once a week and gradually increased to half tablet twice a week with a plan to eventually get to a 1 mg tablet twice a week. I explained that Livalo is metabolized the the other statins, and should not have seen that a profile.  We will check followup labs within a 5-6 months of her taking the medication.

## 2013-05-14 NOTE — Assessment & Plan Note (Signed)
Her blood pressure is relatively well controlled. A little elevated today, but usually is well-controlled. My recommendation is just monitor it and not making changes based on this one reading.

## 2013-05-14 NOTE — Assessment & Plan Note (Signed)
Unfortunately only the LIMA is patent from her In 2008. Would likely consider PCI if additional revascularization is required.

## 2013-05-14 NOTE — Assessment & Plan Note (Signed)
She does have known coronary disease with a ramus lesion that is not grafted as well the diagonal graft is occluded with a 90 minute percent lesion noted on initial But would not bear a followup cath. She is on aspirin, Plavix, beta blocker and ACE inhibitor. She is not on a statin due to intolerance.  She is at least 7 years out from her last ischemic evaluation. She is due for a followup evaluation .  Plan: Treadmill Myoview to evaluate graft patency of the LIMA as well as a progression of disease in the native vessels.

## 2013-05-14 NOTE — Progress Notes (Signed)
PATIENT: [name] MRN: [id number]  DOB: 20-Sep-1931   DOV:05/14/2013 PCP: [name], MD  Clinic Note: Chief Complaint  Patient presents with  . Annual Exam    C/o a little bit of chest pain-due to steroidal cream, she has stopped using the cream and pain has stopped, recently increasing edema.   HPI: [name] is a 80 y.o.  female with a PMH below who presents today for annual CAD. I last saw her in January 2014, she is doing relatively well. She has native and graft disease, but has been relatively stable for the last few years. She has dyslipidemia, but is intolerant to just every statin as well as fenofibrate, and Zetia.  Interval History: She presents today feeling relatively good. Chest discomfort after using some steroid cream, and felt better after stopping it. One episode of episode though she had a blood pressure drop into the 30Q systolic. Since then she started taking lisinopril a little later. She will be mild edema in November and went to urgent care. Not much was done, but improved. Overall she's doing relatively well with no active symptoms of chest tightness or pressure with rest or exertion. No dyspnea at rest or exertion. No heart failure symptoms of PND, orthopnea or edema. No palpitations or irregular/rapid heartbeats. No lightheadedness, dizziness, wooziness and syncope/near-syncope. No TIA or amaurosis fugax symptoms. No melena, hematochezia or hematuria. No claudication.   Past Medical History  Diagnosis Date  . Dyslipidemia     Statin intolerant  . Hypertension   . CAD in native artery   . Non-STEMI (non-ST elevated myocardial infarction) October 2005    EF by 35-40%, echo 40-50%. Angiography: 99% mid LAD involving D1 followed by 70% mid LAD; 80% RI. --> CABG  . 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  [name] CAD (coronary artery disease), autologous vein bypass graft March 2008    Occluded SVG ID 1 and occluded SVG-RI.  [name] Statin  intolerance     Prior Cardiac Evaluation and Past Surgical History: Past Surgical History  Procedure Laterality Date  . Transthoracic echocardiogram  02/19/2004    EF 45-50%, Normal LV function, moderate hypokinesis of anterior wall.  . Cardiac catheterization  02/19/2004    Significant 2 vessel CAD - LAD, D1 and RI  . Coronary artery bypass graft  02/22/2004    x3. LIMA to distal LAD, SVG to first diag, SVG to ramus. SVG harvest from rt thigh.  . Cardiovascular stress test  05/23/2006    Mild lateral/inferolateral ischemia, likely due to occluded SVGs with exhisting disease.  . Cardiac catheterization  07/25/2006    Totally occluded SVG to diag and ramus. Patent LIMA-LAD. Ramus 70-80% proximal stenosis and occluded vein graft.  . Carotid doppler  07/15/2009    Bilat ICAs 0-49% diameter reduction. Normal patency of Bilat subclavian arteries.  . Nm myoview ltd  January 2008    Referred for For mild inferolateral and anterolateral/apical lateral defect with mild reversibility.    Allergies  Allergen Reactions  . Nsaids   . Statins   . Tricor [Fenofibrate]   . Zetia [Ezetimibe]     Current Outpatient Prescriptions  Medication Sig Dispense Refill  . aspirin EC 81 MG tablet Take 81 mg by mouth daily.      [name] atenolol (TENORMIN) 50 MG tablet Take 25 mg by mouth daily.      . bimatoprost (LUMIGAN) 0.03 % ophthalmic solution Place 1 drop into both  eyes at bedtime.      . brimonidine (ALPHAGAN) 0.2 % ophthalmic solution Place 1 drop into both eyes 2 (two) times daily.      . Cholecalciferol (VITAMIN D3) 2000 UNITS capsule Take 2,000 Units by mouth daily.      . clopidogrel (PLAVIX) 75 MG tablet TAKE 1 TABLET EVERY DAY  30 tablet  2  . famotidine (PEPCID AC) 10 MG chewable tablet Chew 10 mg by mouth as needed for heartburn.      [name] lisinopril (PRINIVIL,ZESTRIL) 10 MG tablet TAKE 1 TABLET BY MOUTH EVERY DAY  30 tablet  5  . metroNIDAZOLE (METROCREAM) 0.75 % cream Apply 1 application topically  2 (two) times daily.      [name] triamcinolone cream (KENALOG) 0.1 % Apply 1 application topically as needed.      . Pitavastatin Calcium 1 MG TABS Take 1/2 tablet once a week for month ,then1/2 tablet twice a week  30 tablet  3   No current facility-administered medications for this visit.    History   Social History Narrative   She is a widowed, mother of 2 grandmother of 3. She used to do exercising through the [employer] program as well as the [employer]. Unfortunately, this finding of the classes for seniors changed, and she was unable to make the new classes. She did not like exercising with non-seniors. Besides that she does existing disease, walks up and down the stairs, walk her dog. She is not as active as he had been before.   She never smoked, never drank alcohol.         ROS: A comprehensive Review of Systems - Negative except Mild arthritis pains. A few episodes of edema.  PHYSICAL EXAM BP 150/80  Pulse 70  Ht 5' 6.5" (1.689 m)  Wt 144 lb 3.2 oz (65.409 kg)  BMI 22.93 kg/m2 General appearance: alert, cooperative, appears stated age and no distress Neck: no adenopathy, no carotid bruit, no JVD, supple, symmetrical, trachea midline and thyroid not enlarged, symmetric, no tenderness/mass/nodules Lungs: clear to auscultation bilaterally and normal percussion bilaterally Heart: regular rate and rhythm, S1, S2 normal, no murmur, click, rub or gallop and normal apical impulse Abdomen: soft, non-tender; bowel sounds normal; no masses,  no organomegaly Extremities: extremities normal, atraumatic, no cyanosis or edema Pulses: 2+ and symmetric Neurologic: Grossly normal  XAJ:OINOMVEHM today: Yes Rate: 70  , Rhythm:  NSR; normal ECG.  Recent Labs:  03/26/2013 (from PCP)   Chemistry panel normal including normal liver enzymes. CBC normal.   Lipid panel: TC 233, TG 121, HDL 55, LDL 154. --> Statin intolerance  ASSESSMENT / PLAN: CAD (coronary artery disease), autologous vein  bypass graft She does have known coronary disease with a ramus lesion that is not grafted as well the diagonal graft is occluded with a 90 minute percent lesion noted on initial But would not bear a followup cath. She is on aspirin, Plavix, beta blocker and ACE inhibitor. She is not on a statin due to intolerance.  She is at least 7 years out from her last ischemic evaluation. She is due for a followup evaluation .  Plan: Treadmill Myoview to evaluate graft patency of the LIMA as well as a progression of disease in the native vessels.   S/P CABG x 3 Unfortunately only the LIMA is patent from her In 2008. Would likely consider PCI if additional revascularization is required.  Non-STEMI (non-ST elevated myocardial infarction) Whole was and inferior lateral defect noted  on her Myoview prior to her last catheterization, I wonder this could be an infarct from occlusion of the ramus graft. Otherwise her EF was improved and almost back to normal by a 2008 Myoview.  Hyperlipidemia LDL goal <70 Not very good control of her lipids. We have really been limited to Wellbutrin use. I think her primary care provider has given up further management and deferred to me based on the last note I saw. I had a long talk with her about the importance of cholesterol control, we came to the conclusion that we would try Livalo 1 mg tablets we will start off taking a half a tablet once a week and gradually increased to half tablet twice a week with a plan to eventually get to a 1 mg tablet twice a week. I explained that Livalo is metabolized the the other statins, and should not have seen that a profile.  We will check followup labs within a 5-6 months of her taking the medication.  Hypertension Her blood pressure is relatively well controlled. A little elevated today, but usually is well-controlled. My recommendation is just monitor it and not making changes based on this one reading.  Statin intolerance With traction  Livalo, if this does not work, I will refer her to the lipid clinic at the [facility] office.   her blood pressure is a bit today. But usually is well-controlled. My information to just continue with her current regimen and not make any changes.   Orders Placed This Encounter  Procedures  . Lipid panel    Standing Status: Future     Number of Occurrences:      Standing Expiration Date: 05/12/2014    Order Specific Question:  Has the patient fasted?    Answer:  Yes  . Comprehensive metabolic panel    Standing Status: Future     Number of Occurrences:      Standing Expiration Date: 05/12/2014    Order Specific Question:  Has the patient fasted?    Answer:  Yes  . Myocardial Perfusion Imaging    Dx 7 years ou patency of graft    Standing Status: Future     Number of Occurrences:      Standing Expiration Date: 05/12/2014    Order Specific Question:  Where should this test be performed    Answer:  [facility]    Order Specific Question:  Type of stress    Answer:  Exercise    Order Specific Question:  Patient weight in lbs    Answer:  144  . EKG 12-Lead    Followup: One year a stress test is grossly abnormal  Wadie Mattie W. Ellyn Hack, M.D., M.S. [facility] [address] [place], [place]  [address]  [phone] Pager # [phone]

## 2013-05-16 ENCOUNTER — Ambulatory Visit (HOSPITAL_COMMUNITY)
Admission: RE | Admit: 2013-05-16 | Discharge: 2013-05-16 | Disposition: A | Discharge status: Home / Self Care | Payer: Medicare Other | Source: Ambulatory Visit | Attending: Cardiovascular Disease | Admitting: Cardiovascular Disease

## 2013-05-16 DIAGNOSIS — I2510 Atherosclerotic heart disease of native coronary artery without angina pectoris: Secondary | ICD-10-CM

## 2013-05-16 DIAGNOSIS — Z951 Presence of aortocoronary bypass graft: Secondary | ICD-10-CM

## 2013-05-16 MED ORDER — TECHNETIUM TC 99M SESTAMIBI GENERIC - CARDIOLITE
30.4000 | Freq: Once | INTRAVENOUS | Status: AC | PRN
  Administered 2013-05-16: 30 via INTRAVENOUS

## 2013-05-16 MED ORDER — TECHNETIUM TC 99M SESTAMIBI GENERIC - CARDIOLITE
10.2000 | Freq: Once | INTRAVENOUS | Status: AC | PRN
  Administered 2013-05-16: 10 via INTRAVENOUS

## 2013-05-16 NOTE — Procedures (Addendum)
[facility] [address] [place] [address] [phone]  Cardiology Nuclear Med Study  [name] is a 80 y.o. female     MRN : [id number]     DOB: Jan 07, 1932  Procedure Date: 05/16/2013  Nuclear Med Background Indication for Stress Test:  Graft Patency History:  CABG X3-02/22/2004;CAD;MI-01/2004 Cardiac Risk Factors: Hypertension and Lipids  Symptoms:  Chest Pain and Light-Headedness   Nuclear Pre-Procedure Caffeine/Decaff Intake:  12:00am NPO After: 10am   IV Site: R Forearm  IV 0.9% NS with Angio Cath:  22g  Chest Size (in):  n/a IV Started by: Azucena Cecil, RN  Height: 5\' 7"  (1.702 m)  Cup Size: C  BMI:  Body mass index is 22.55 kg/(m^2). Weight:  144 lb (65.318 kg)   Tech Comments:  n/a    Nuclear Med Study 1 or 2 day study: 1 day  Stress Test Type:  Stress  Order Authorizing Provider:  Glenetta Hew, MD   Resting Radionuclide: Technetium 44m Sestamibi  Resting Radionuclide Dose: 10.2 mCi   Stress Radionuclide:  Technetium 17m Sestamibi  Stress Radionuclide Dose: 30.4 mCi           Stress Protocol Rest HR: 82 Stress HR: 137  Rest BP: 156/92 Stress BP: 176/75  Exercise Time (min)  5:15 METS: 7.0   Predicted Max HR: 140 bpm % Max HR: 97.86 bpm Rate Pressure Product: 26304  Dose of Adenosine (mg):  n/a Dose of Lexiscan: n/a mg  Dose of Atropine (mg): n/a Dose of Dobutamine: n/a mcg/kg/min (at max HR)  Stress Test Technologist: Leane Para, CCT Nuclear Technologist: Imagene Riches, CNMT   Rest Procedure:  Myocardial perfusion imaging was performed at rest 45 minutes following the intravenous administration of Technetium 77m Sestamibi. Stress Procedure:  The patient performed treadmill exercise using a Bruce  Protocol for 5:15 minutes. The patient stopped due to SOB and denied any chest pain.  There were  significant ST-T wave changes.  Technetium 51m Sestamibi was injected at peak exercise and  myocardial perfusion imaging was performed after a brief delay.  Transient Ischemic Dilatation (Normal <1.22):  0.77 Lung/Heart Ratio (Normal <0.45):  0.34 QGS EDV:  46 ml QGS ESV:  11 ml LV Ejection Fraction: 76%  Rest ECG: NSR - Normal EKG  Stress ECG: Significant ST abnormalities consistent with ischemia.  QPS Raw Data Images:  Normal; no motion artifact; normal heart/lung ratio. Stress Images:  Normal homogeneous uptake in all areas of the myocardium. Rest Images:  Normal homogeneous uptake in all areas of the myocardium. Subtraction (SDS):  No evidence of ischemia.  Impression Exercise Capacity:  Fair exercise capacity. BP Response:  Normal blood pressure response. Clinical Symptoms:  There is dyspnea. ECG Impression:  Significant ST abnormalities consistent with ischemia. Comparison with Prior Nuclear Study: No images to compare  Overall Impression:  Low risk stress nuclear study with normal perfusion. Fair exercise effort, however, there were 2-3 mm horizontal ST depression inferiorly at peak exercise. Clinical correlation is advised. This may be an electrically false positive study.  LV Wall Motion:  NL LV Function; NL Wall Motion; EF 76%.  Pixie Casino, MD, [facility] Board Certified in Nuclear Cardiology Attending Cardiologist [place], MD  05/16/2013 5:19 PM

## 2013-05-22 ENCOUNTER — Telehealth: Payer: Self-pay | Admitting: *Deleted

## 2013-05-22 MED ORDER — PITAVASTATIN CALCIUM 2 MG PO TABS: 1.0000 mg | ORAL_TABLET | ORAL | Status: DC

## 2013-05-22 NOTE — Telephone Encounter (Signed)
Informed patient that samples of  LIVALO  Are available for her to pick up.  She states she will pick it tomorrow.  She will follow the instruction  From the last office visit

## 2013-05-26 ENCOUNTER — Telehealth: Payer: Self-pay | Admitting: *Deleted

## 2013-05-26 NOTE — Telephone Encounter (Signed)
Returning your call. °

## 2013-05-26 NOTE — Telephone Encounter (Signed)
Spoke to patient. Result given . Verbalized understanding  

## 2013-05-26 NOTE — Telephone Encounter (Signed)
Message copied by Raiford Simmonds on Mon May 26, 2013 11:54 AM ------      Message from: Leonie Man      Created: Sun May 18, 2013  5:05 AM       Conflicting findings. The treadmill portion would be concerning for possible ischemia, but the nuclear images argue against ischemia.      In the absence of symptoms, with the images, the EKG findings. Only in the setting of active or ongoing episodes of chest pain would be considered invasive evaluation with this test result. ------

## 2013-05-26 NOTE — Telephone Encounter (Signed)
LEFT MESSAGE TO CALL BACK   PER Dr Ellyn Hack - Myoview was a negative stress test

## 2013-06-18 ENCOUNTER — Other Ambulatory Visit: Payer: Self-pay | Admitting: Cardiology

## 2013-06-18 NOTE — Telephone Encounter (Signed)
Rx was sent to pharmacy electronically. 

## 2013-07-17 ENCOUNTER — Encounter: Payer: Self-pay | Admitting: Podiatry

## 2013-07-17 ENCOUNTER — Ambulatory Visit (INDEPENDENT_AMBULATORY_CARE_PROVIDER_SITE_OTHER): Payer: Medicare Other | Admitting: Podiatry

## 2013-07-17 VITALS — BP 148/69 | HR 86 | Resp 12

## 2013-07-17 DIAGNOSIS — L84 Corns and callosities: Secondary | ICD-10-CM

## 2013-07-18 NOTE — Progress Notes (Signed)
Subjective:     Patient ID: [name], female   DOB: 02/26/1932, 80 y.o.   MRN: [id number]  HPI patient is found to have lesions plantar aspect both feet that are painful and she cannot walk on comfortably   Review of Systems     Objective:   Physical Exam Neurovascular status intact with keratotic lesions plantar aspect both feet    Assessment:     Chronic lesion formation    Plan:     Debridement of painful lesions with no bleeding noted

## 2013-08-18 DIAGNOSIS — H4011X0 Primary open-angle glaucoma, stage unspecified: Secondary | ICD-10-CM | POA: Diagnosis not present

## 2013-08-18 DIAGNOSIS — H409 Unspecified glaucoma: Secondary | ICD-10-CM | POA: Diagnosis not present

## 2013-09-16 ENCOUNTER — Other Ambulatory Visit: Payer: Self-pay | Admitting: *Deleted

## 2013-09-16 MED ORDER — ATENOLOL 50 MG PO TABS: 25.0000 mg | ORAL_TABLET | Freq: Every day | ORAL | Status: DC

## 2013-09-16 NOTE — Telephone Encounter (Signed)
Rx refill sent to patient pharmacy   

## 2013-09-24 ENCOUNTER — Other Ambulatory Visit: Payer: Self-pay | Admitting: *Deleted

## 2013-09-24 ENCOUNTER — Telehealth: Payer: Self-pay | Admitting: *Deleted

## 2013-09-24 MED ORDER — ATENOLOL 25 MG PO TABS: 25.0000 mg | ORAL_TABLET | Freq: Every day | ORAL | Status: DC

## 2013-09-24 NOTE — Telephone Encounter (Signed)
Open error 

## 2013-09-24 NOTE — Telephone Encounter (Signed)
Message copied by Raiford Simmonds on Wed Sep 24, 2013 10:58 AM ------      Message from: Ellyn Hack, DAVID W      Created: Wed Sep 17, 2013  4:12 PM       Can we confirm that she is still taking 25 mg Atenolol (she may be taking 1/2 of a 50mg  tab).  If she is taking the 1/2 tab, that would explain why BC/BS thinks she may be non-compliant.       If so - we should convert the Rx to 25 mg from 50mg .            Leonie Man, MD       ------

## 2013-09-24 NOTE — Telephone Encounter (Signed)
Spoke with patient. She states she has been taking 1/2 tablet of 50 mg for the last 10 years or so. RN informed patient will change dosage of medication to 25 mg tablets Patient states she has just refilled Atenolol 50 mg. RN informed patient she will place prescription on HOLD

## 2013-10-21 ENCOUNTER — Telehealth: Payer: Self-pay | Admitting: *Deleted

## 2013-10-21 DIAGNOSIS — Z79899 Other long term (current) drug therapy: Secondary | ICD-10-CM

## 2013-10-21 DIAGNOSIS — E785 Hyperlipidemia, unspecified: Secondary | ICD-10-CM

## 2013-10-21 NOTE — Telephone Encounter (Signed)
Message copied by Raiford Simmonds on Tue Oct 21, 2013  4:18 PM ------      Message from: Roberts, [place]: Mon May 12, 2013 11:08 AM       Labs to be drawn in 10/2013      See ov 05/12/13 ------

## 2013-10-21 NOTE — Telephone Encounter (Signed)
Mailed letter and labslip 

## 2013-10-23 ENCOUNTER — Ambulatory Visit: Payer: Medicare Other | Admitting: Podiatry

## 2013-10-30 ENCOUNTER — Encounter: Payer: Self-pay | Admitting: Podiatry

## 2013-10-30 ENCOUNTER — Ambulatory Visit (INDEPENDENT_AMBULATORY_CARE_PROVIDER_SITE_OTHER): Payer: Medicare Other | Admitting: Podiatry

## 2013-10-30 DIAGNOSIS — L84 Corns and callosities: Secondary | ICD-10-CM

## 2013-11-01 NOTE — Progress Notes (Signed)
Subjective:     Patient ID: [name], female   DOB: [birth year], 80 y.o.   MRN: [id number]  HPI patient presents with painful lesions on both feet that she cannot cut   Review of Systems     Objective:   Physical Exam Neurovascular status unchanged with keratotic lesions plantar aspect both feet    Assessment:     Painful lesions plantar bilateral    Plan:     Debris painful lesions with no iatrogenic bleeding noted

## 2014-02-01 DIAGNOSIS — Z23 Encounter for immunization: Secondary | ICD-10-CM | POA: Diagnosis not present

## 2014-02-05 ENCOUNTER — Ambulatory Visit: Payer: Medicare Other | Admitting: Podiatry

## 2014-03-02 ENCOUNTER — Encounter: Payer: Self-pay | Admitting: Podiatry

## 2014-03-02 ENCOUNTER — Ambulatory Visit (INDEPENDENT_AMBULATORY_CARE_PROVIDER_SITE_OTHER): Payer: Medicare Other | Admitting: Podiatry

## 2014-03-02 DIAGNOSIS — L84 Corns and callosities: Secondary | ICD-10-CM | POA: Diagnosis not present

## 2014-03-03 DIAGNOSIS — Z961 Presence of intraocular lens: Secondary | ICD-10-CM | POA: Diagnosis not present

## 2014-03-03 DIAGNOSIS — H4011X2 Primary open-angle glaucoma, moderate stage: Secondary | ICD-10-CM | POA: Diagnosis not present

## 2014-03-03 NOTE — Progress Notes (Signed)
Subjective:     Patient ID: [name], female   DOB: 06/19/1931, 80 y.o.   MRN: [id number]  HPIpatient presents with nail disease 1-5 both feet that she cannot cut and they're painful   Review of Systems     Objective:   Physical Exam Neurovascular status intact with thick yellow brittle nailbeds 1-5 both feet    Assessment:     Mycotic nail infections with pain 1-5 both feet    Plan:     Debride painful nailbeds 1-5 both feet with no iatrogenic bleeding noted

## 2014-03-20 DIAGNOSIS — Z1231 Encounter for screening mammogram for malignant neoplasm of breast: Secondary | ICD-10-CM | POA: Diagnosis not present

## 2014-04-03 ENCOUNTER — Other Ambulatory Visit: Payer: Medicare Other

## 2014-04-03 ENCOUNTER — Ambulatory Visit: Payer: Medicare Other | Admitting: Podiatry

## 2014-04-03 DIAGNOSIS — L84 Corns and callosities: Secondary | ICD-10-CM

## 2014-04-03 NOTE — Progress Notes (Signed)
Pt is here to PUO 

## 2014-04-03 NOTE — Patient Instructions (Signed)

## 2014-04-06 DIAGNOSIS — D229 Melanocytic nevi, unspecified: Secondary | ICD-10-CM | POA: Diagnosis not present

## 2014-04-06 DIAGNOSIS — L259 Unspecified contact dermatitis, unspecified cause: Secondary | ICD-10-CM | POA: Diagnosis not present

## 2014-04-06 DIAGNOSIS — L821 Other seborrheic keratosis: Secondary | ICD-10-CM | POA: Diagnosis not present

## 2014-04-06 DIAGNOSIS — C44712 Basal cell carcinoma of skin of right lower limb, including hip: Secondary | ICD-10-CM | POA: Diagnosis not present

## 2014-04-06 DIAGNOSIS — L820 Inflamed seborrheic keratosis: Secondary | ICD-10-CM | POA: Diagnosis not present

## 2014-04-06 DIAGNOSIS — L719 Rosacea, unspecified: Secondary | ICD-10-CM | POA: Diagnosis not present

## 2014-04-06 DIAGNOSIS — L570 Actinic keratosis: Secondary | ICD-10-CM | POA: Diagnosis not present

## 2014-04-06 DIAGNOSIS — Z85828 Personal history of other malignant neoplasm of skin: Secondary | ICD-10-CM | POA: Diagnosis not present

## 2014-04-06 DIAGNOSIS — D485 Neoplasm of uncertain behavior of skin: Secondary | ICD-10-CM | POA: Diagnosis not present

## 2014-04-07 DIAGNOSIS — Z1389 Encounter for screening for other disorder: Secondary | ICD-10-CM | POA: Diagnosis not present

## 2014-04-07 DIAGNOSIS — Z1211 Encounter for screening for malignant neoplasm of colon: Secondary | ICD-10-CM | POA: Diagnosis not present

## 2014-04-07 DIAGNOSIS — E780 Pure hypercholesterolemia: Secondary | ICD-10-CM | POA: Diagnosis not present

## 2014-04-07 DIAGNOSIS — I2510 Atherosclerotic heart disease of native coronary artery without angina pectoris: Secondary | ICD-10-CM | POA: Diagnosis not present

## 2014-04-07 DIAGNOSIS — Z0000 Encounter for general adult medical examination without abnormal findings: Secondary | ICD-10-CM | POA: Diagnosis not present

## 2014-04-07 DIAGNOSIS — E559 Vitamin D deficiency, unspecified: Secondary | ICD-10-CM | POA: Diagnosis not present

## 2014-04-07 DIAGNOSIS — I10 Essential (primary) hypertension: Secondary | ICD-10-CM | POA: Diagnosis not present

## 2014-04-07 DIAGNOSIS — Z23 Encounter for immunization: Secondary | ICD-10-CM | POA: Diagnosis not present

## 2014-04-23 ENCOUNTER — Other Ambulatory Visit: Payer: Self-pay | Admitting: Cardiology

## 2014-04-23 NOTE — Telephone Encounter (Signed)
Rx(s) sent to pharmacy electronically.  

## 2014-04-26 ENCOUNTER — Emergency Department (HOSPITAL_COMMUNITY): Payer: Medicare Other

## 2014-04-26 ENCOUNTER — Observation Stay (HOSPITAL_COMMUNITY)
Admission: EM | Admit: 2014-04-26 | Discharge: 2014-04-29 | Disposition: A | Discharge status: Home / Self Care | Payer: Medicare Other | Attending: Internal Medicine | Admitting: Internal Medicine

## 2014-04-26 ENCOUNTER — Encounter (HOSPITAL_COMMUNITY): Payer: Self-pay | Admitting: Nurse Practitioner

## 2014-04-26 DIAGNOSIS — I2510 Atherosclerotic heart disease of native coronary artery without angina pectoris: Secondary | ICD-10-CM | POA: Diagnosis not present

## 2014-04-26 DIAGNOSIS — I25810 Atherosclerosis of coronary artery bypass graft(s) without angina pectoris: Secondary | ICD-10-CM | POA: Diagnosis not present

## 2014-04-26 DIAGNOSIS — R0789 Other chest pain: Secondary | ICD-10-CM | POA: Diagnosis not present

## 2014-04-26 DIAGNOSIS — I2582 Chronic total occlusion of coronary artery: Secondary | ICD-10-CM | POA: Diagnosis not present

## 2014-04-26 DIAGNOSIS — E785 Hyperlipidemia, unspecified: Secondary | ICD-10-CM | POA: Diagnosis not present

## 2014-04-26 DIAGNOSIS — Z7902 Long term (current) use of antithrombotics/antiplatelets: Secondary | ICD-10-CM | POA: Insufficient documentation

## 2014-04-26 DIAGNOSIS — I252 Old myocardial infarction: Secondary | ICD-10-CM | POA: Diagnosis not present

## 2014-04-26 DIAGNOSIS — M79673 Pain in unspecified foot: Secondary | ICD-10-CM | POA: Insufficient documentation

## 2014-04-26 DIAGNOSIS — Z951 Presence of aortocoronary bypass graft: Secondary | ICD-10-CM | POA: Diagnosis not present

## 2014-04-26 DIAGNOSIS — R12 Heartburn: Secondary | ICD-10-CM | POA: Insufficient documentation

## 2014-04-26 DIAGNOSIS — R197 Diarrhea, unspecified: Secondary | ICD-10-CM | POA: Diagnosis not present

## 2014-04-26 DIAGNOSIS — R262 Difficulty in walking, not elsewhere classified: Secondary | ICD-10-CM | POA: Diagnosis not present

## 2014-04-26 DIAGNOSIS — R079 Chest pain, unspecified: Secondary | ICD-10-CM | POA: Diagnosis not present

## 2014-04-26 DIAGNOSIS — I10 Essential (primary) hypertension: Secondary | ICD-10-CM | POA: Insufficient documentation

## 2014-04-26 DIAGNOSIS — Z7982 Long term (current) use of aspirin: Secondary | ICD-10-CM | POA: Diagnosis not present

## 2014-04-26 DIAGNOSIS — K8020 Calculus of gallbladder without cholecystitis without obstruction: Secondary | ICD-10-CM | POA: Diagnosis not present

## 2014-04-26 DIAGNOSIS — J984 Other disorders of lung: Secondary | ICD-10-CM | POA: Diagnosis not present

## 2014-04-26 LAB — D-DIMER, QUANTITATIVE (NOT AT ARMC): D DIMER QUANT: 0.64 ug{FEU}/mL — AB (ref 0.00–0.48)

## 2014-04-26 LAB — BASIC METABOLIC PANEL
ANION GAP: 7 (ref 5–15)
BUN: 18 mg/dL (ref 6–23)
CHLORIDE: 107 meq/L (ref 96–112)
CO2: 26 mmol/L (ref 19–32)
Calcium: 10.1 mg/dL (ref 8.4–10.5)
Creatinine, Ser: 0.88 mg/dL (ref 0.50–1.10)
GFR calc non Af Amer: 60 mL/min — ABNORMAL LOW (ref >90)
GFR, EST AFRICAN AMERICAN: 70 mL/min — AB (ref >90)
Glucose, Bld: 126 mg/dL — ABNORMAL HIGH (ref 70–99)
Potassium: 3.9 mmol/L (ref 3.5–5.1)
SODIUM: 140 mmol/L (ref 135–145)

## 2014-04-26 LAB — CBC
HEMATOCRIT: 40.9 % (ref 36.0–46.0)
Hemoglobin: 13.5 g/dL (ref 12.0–15.0)
MCH: 30.4 pg (ref 26.0–34.0)
MCHC: 33 g/dL (ref 30.0–36.0)
MCV: 92.1 fL (ref 78.0–100.0)
PLATELETS: 286 10*3/uL (ref 150–400)
RBC: 4.44 MIL/uL (ref 3.87–5.11)
RDW: 13 % (ref 11.5–15.5)
WBC: 8.1 10*3/uL (ref 4.0–10.5)

## 2014-04-26 LAB — I-STAT TROPONIN, ED: TROPONIN I, POC: 0.01 ng/mL (ref 0.00–0.08)

## 2014-04-26 MED ORDER — IOHEXOL 350 MG/ML SOLN
75.0000 mL | Freq: Once | INTRAVENOUS | Status: AC | PRN
  Administered 2014-04-26: 75 mL via INTRAVENOUS

## 2014-04-26 MED ORDER — GI COCKTAIL ~~LOC~~
30.0000 mL | Freq: Once | ORAL | Status: AC
  Administered 2014-04-27: 30 mL via ORAL
  Filled 2014-04-26: qty 30

## 2014-04-26 MED ORDER — ASPIRIN 81 MG PO CHEW
162.0000 mg | CHEWABLE_TABLET | Freq: Once | ORAL | Status: AC
  Administered 2014-04-26: 162 mg via ORAL
  Filled 2014-04-26: qty 2

## 2014-04-26 NOTE — ED Notes (Signed)
Pt going to radiology.  Notified transported that pt can go to E40 when they are finished with x-ray.

## 2014-04-26 NOTE — ED Provider Notes (Signed)
CSN: [id number]     Arrival date & time 04/26/14  1850 History   First MD Initiated Contact with Patient 04/26/14 2026     Chief Complaint  Patient presents with  . Chest Pain     (Consider location/radiation/quality/duration/timing/severity/associated sxs/prior Treatment) HPI  Pt presenting with c/o chest pain.  She states pain hs been intermittent- located in her midsternal area- lasting 2-3 minutes at a time.  Chest pain has been at rest.  She has taken 2 baby aspirins prior to arrival.  No shortness of breath, no leg swelling.  No hx of DVT/PE. No diaphoresis or nausea associated.  She does have hx of CAD and is s/op cabg 10 years ago.  States she does not usually have any chest pain.  There are no other associated systemic symptoms, there are no other alleviating or modifying factors.  Currently in the ED she is chest pain free.   Past Medical History  Diagnosis Date  . Dyslipidemia     Statin intolerant  . Hypertension   . CAD in native artery   . Non-STEMI (non-ST elevated myocardial infarction) October 2005    EF by 35-40%, echo 40-50%. Angiography: 99% mid LAD involving D1 followed by 70% mid LAD; 80% RI. --> CABG  . 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  [name] CAD (coronary artery disease), autologous vein bypass graft March 2008    Occluded SVG ID 1 and occluded SVG-RI.  [name] Statin intolerance    Past Surgical History  Procedure Laterality Date  . Transthoracic echocardiogram  02/19/2004    EF 45-50%, Normal LV function, moderate hypokinesis of anterior wall.  . Cardiac catheterization  02/19/2004    Significant 2 vessel CAD - LAD, D1 and RI  . Coronary artery bypass graft  02/22/2004    x3. LIMA to distal LAD, SVG to first diag, SVG to ramus. SVG harvest from rt thigh.  . Cardiovascular stress test  05/23/2006    Mild lateral/inferolateral ischemia, likely due to occluded SVGs with exhisting disease.  . Cardiac catheterization  07/25/2006    Totally  occluded SVG to diag and ramus. Patent LIMA-LAD. Ramus 70-80% proximal stenosis and occluded vein graft.  . Carotid doppler  07/15/2009    Bilat ICAs 0-49% diameter reduction. Normal patency of Bilat subclavian arteries.  . Nm myoview ltd  January 2008    Referred for For mild inferolateral and anterolateral/apical lateral defect with mild reversibility.  . Left heart catheterization with coronary/graft angiogram N/A 04/28/2014    Procedure: LEFT HEART CATHETERIZATION WITH Beatrix Fetters;  Surgeon: Sinclair Grooms, MD;  Location: [facility] CATH LAB;  Service: Cardiovascular;  Laterality: N/A;   Family History  Problem Relation Age of Onset  . Alzheimer's disease Mother   . Alzheimer's disease Sister   . Hyperlipidemia Sister   . Hypertension Sister   . Diabetes Sister   . Hyperlipidemia Sister   . Hypertension Sister   . Pancreatitis Child    History  Substance Use Topics  . Smoking status: Never Smoker   . Smokeless tobacco: Never Used  . Alcohol Use: 0.5 oz/week    1 drink(s) per week   OB History    No data available     Review of Systems  ROS reviewed and all otherwise negative except for mentioned in HPI    Allergies  Nsaids; Statins; Tricor; and Zetia  Home Medications   Prior to Admission medications   Medication Sig Start Date  End Date Taking? Authorizing Provider  aspirin EC 81 MG tablet Take 81 mg by mouth daily.   Yes Historical Provider, MD  atenolol (TENORMIN) 25 MG tablet Take 1 tablet (25 mg total) by mouth daily. 09/24/13  Yes Leonie Man, MD  bimatoprost (LUMIGAN) 0.03 % ophthalmic solution Place 1 drop into both eyes at bedtime.   Yes Historical Provider, MD  brimonidine (ALPHAGAN) 0.2 % ophthalmic solution Place 1 drop into both eyes 2 (two) times daily.   Yes Historical Provider, MD  Cholecalciferol (VITAMIN D3) 2000 UNITS capsule Take 2,000 Units by mouth daily.   Yes Historical Provider, MD  clopidogrel (PLAVIX) 75 MG tablet TAKE 1 TABLET BY  MOUTH EVERY DAY 04/23/14  Yes Leonie Man, MD  FLUZONE HIGH-DOSE 0.5 ML SUSY Inject 0.5 mLs as directed once. 02/01/14  Yes Historical Provider, MD  lisinopril (PRINIVIL,ZESTRIL) 10 MG tablet TAKE 1 TABLET BY MOUTH EVERY DAY 04/23/14  Yes Leonie Man, MD  metroNIDAZOLE (METROCREAM) 0.75 % cream Apply 1 application topically 2 (two) times daily. 04/17/13  Yes Historical Provider, MD  triamcinolone cream (KENALOG) 0.1 % Apply 1 application topically as needed. 03/31/13  Yes Historical Provider, MD  pantoprazole (PROTONIX) 40 MG tablet Take 1 tablet (40 mg total) by mouth daily. 04/29/14   Thurnell Lose, MD  Pitavastatin Calcium (LIVALO) 2 MG TABS Take 0.5 tablets (1 mg total) by mouth 2 (two) times a week. Patient not taking: Reported on 04/26/2014 05/22/13   Leonie Man, MD  Pitavastatin Calcium 1 MG TABS Take 1/2 tablet once a week for month ,then1/2 tablet twice a week Patient not taking: Reported on 04/26/2014 05/12/13   Leonie Man, MD   BP 128/48 mmHg  Pulse 63  Temp(Src) 97.7 F (36.5 C) (Oral)  Resp 18  Ht 5\' 6"  (1.676 m)  Wt 138 lb 0.1 oz (62.6 kg)  BMI 22.29 kg/m2  SpO2 97%  Vitals reviewed Physical Exam  Physical Examination: General appearance - alert, well appearing, and in no distress Mental status - alert, oriented to person, place, and time Eyes - no conjunctival injection, no scleral icterus Mouth - mucous membranes moist, pharynx normal without lesions Chest - clear to auscultation, no wheezes, rales or rhonchi, symmetric air entry Heart - normal rate, regular rhythm, normal S1, S2, no murmurs, rubs, clicks or gallops Abdomen - soft, nontender, nondistended, no masses or organomegaly Extremities - peripheral pulses normal, no pedal edema, no clubbing or cyanosis Skin - normal coloration and turgor, no rashes  ED Course  Procedures (including critical care time)  11:43 PM d/w Dr. Hal Hope for admission.   Labs Review Labs Reviewed  BASIC METABOLIC  PANEL - Abnormal; Notable for the following:    Glucose, Bld 126 (*)    GFR calc non Af Amer 60 (*)    GFR calc Af Amer 70 (*)    All other components within normal limits  D-DIMER, QUANTITATIVE - Abnormal; Notable for the following:    D-Dimer, Quant 0.64 (*)    All other components within normal limits  COMPREHENSIVE METABOLIC PANEL - Abnormal; Notable for the following:    Glucose, Bld 127 (*)    GFR calc non Af Amer 77 (*)    GFR calc Af Amer 89 (*)    All other components within normal limits  CBC WITH DIFFERENTIAL - Abnormal; Notable for the following:    Neutrophils Relative % 78 (*)    All other components within normal limits  CBC  TROPONIN I  TROPONIN I  TROPONIN I  PROTIME-INR  I-STAT TROPOININ, ED    Imaging Review No results found.   EKG Interpretation   Date/Time:  Sunday April 26 2014 18:55:25 EST Ventricular Rate:  84 PR Interval:  178 QRS Duration: 86 QT Interval:  380 QTC Calculation: 449 R Axis:   -3 Text Interpretation:  Normal sinus rhythm with sinus arrhythmia  Nonspecific T wave abnormality Abnormal ECG No significant change since  last tracing Confirmed by [facility]  MD, Devell Parkerson [phone]) on 04/27/2014 1:12:01  AM      MDM   Final diagnoses:  Chest pain  Other chest pain  Chest pain of uncertain etiology    Pt with hx of CAD/CABG presents with c/o chest pain- intermittent throughout the day today.  intiial ED workup is reassuring, EKG and troponin are reassuring.  Pt had elevated d-dimer, but CT angio without evidence of PE.  Pt admitted to triad for further workup and evaluation.      Threasa Beards, MD 04/30/14 [phone]

## 2014-04-26 NOTE — ED Notes (Signed)
She c/o upper abd and chest pain onset this afternoon while at rest, intermittent since onset. Denies sob. She took 2 - 325mg  asa tablets pta. She is a&ox4, resp e/u

## 2014-04-27 ENCOUNTER — Observation Stay (HOSPITAL_COMMUNITY): Payer: Medicare Other

## 2014-04-27 ENCOUNTER — Encounter (HOSPITAL_COMMUNITY): Payer: Self-pay | Admitting: Internal Medicine

## 2014-04-27 DIAGNOSIS — I25719 Atherosclerosis of autologous vein coronary artery bypass graft(s) with unspecified angina pectoris: Secondary | ICD-10-CM

## 2014-04-27 DIAGNOSIS — R079 Chest pain, unspecified: Secondary | ICD-10-CM | POA: Diagnosis not present

## 2014-04-27 DIAGNOSIS — Z951 Presence of aortocoronary bypass graft: Secondary | ICD-10-CM

## 2014-04-27 DIAGNOSIS — E785 Hyperlipidemia, unspecified: Secondary | ICD-10-CM | POA: Diagnosis not present

## 2014-04-27 DIAGNOSIS — I517 Cardiomegaly: Secondary | ICD-10-CM

## 2014-04-27 DIAGNOSIS — I10 Essential (primary) hypertension: Secondary | ICD-10-CM

## 2014-04-27 DIAGNOSIS — I2510 Atherosclerotic heart disease of native coronary artery without angina pectoris: Secondary | ICD-10-CM | POA: Diagnosis not present

## 2014-04-27 DIAGNOSIS — R0789 Other chest pain: Secondary | ICD-10-CM | POA: Diagnosis not present

## 2014-04-27 LAB — CBC WITH DIFFERENTIAL/PLATELET
BASOS ABS: 0 10*3/uL (ref 0.0–0.1)
BASOS PCT: 0 % (ref 0–1)
EOS ABS: 0 10*3/uL (ref 0.0–0.7)
Eosinophils Relative: 0 % (ref 0–5)
HEMATOCRIT: 39.6 % (ref 36.0–46.0)
Hemoglobin: 13.5 g/dL (ref 12.0–15.0)
LYMPHS PCT: 15 % (ref 12–46)
Lymphs Abs: 1.2 10*3/uL (ref 0.7–4.0)
MCH: 31.8 pg (ref 26.0–34.0)
MCHC: 34.1 g/dL (ref 30.0–36.0)
MCV: 93.2 fL (ref 78.0–100.0)
MONO ABS: 0.6 10*3/uL (ref 0.1–1.0)
Monocytes Relative: 7 % (ref 3–12)
Neutro Abs: 6.4 10*3/uL (ref 1.7–7.7)
Neutrophils Relative %: 78 % — ABNORMAL HIGH (ref 43–77)
PLATELETS: 299 10*3/uL (ref 150–400)
RBC: 4.25 MIL/uL (ref 3.87–5.11)
RDW: 13.2 % (ref 11.5–15.5)
WBC: 8.2 10*3/uL (ref 4.0–10.5)

## 2014-04-27 LAB — TROPONIN I
Troponin I: <0.03 ng/mL (ref <0.031)
Troponin I: <0.03 ng/mL (ref <0.031)

## 2014-04-27 LAB — COMPREHENSIVE METABOLIC PANEL
ALBUMIN: 3.7 g/dL (ref 3.5–5.2)
ALK PHOS: 67 U/L (ref 39–117)
ALT: 12 U/L (ref 0–35)
AST: 21 U/L (ref 0–37)
Anion gap: 13 (ref 5–15)
BILIRUBIN TOTAL: 0.6 mg/dL (ref 0.3–1.2)
BUN: 15 mg/dL (ref 6–23)
CHLORIDE: 110 meq/L (ref 96–112)
CO2: 22 mmol/L (ref 19–32)
Calcium: 9.7 mg/dL (ref 8.4–10.5)
Creatinine, Ser: 0.78 mg/dL (ref 0.50–1.10)
GFR calc Af Amer: 89 mL/min — ABNORMAL LOW (ref >90)
GFR, EST NON AFRICAN AMERICAN: 77 mL/min — AB (ref >90)
Glucose, Bld: 127 mg/dL — ABNORMAL HIGH (ref 70–99)
POTASSIUM: 3.8 mmol/L (ref 3.5–5.1)
Sodium: 145 mmol/L (ref 135–145)
Total Protein: 6.3 g/dL (ref 6.0–8.3)

## 2014-04-27 MED ORDER — PANTOPRAZOLE SODIUM 40 MG PO TBEC
40.0000 mg | DELAYED_RELEASE_TABLET | Freq: Every day | ORAL | Status: DC
  Administered 2014-04-27 – 2014-04-29 (×3): 40 mg via ORAL
  Filled 2014-04-27 (×3): qty 1

## 2014-04-27 MED ORDER — SODIUM CHLORIDE 0.9 % IV SOLN: INTRAVENOUS | Status: DC

## 2014-04-27 MED ORDER — REGADENOSON 0.4 MG/5ML IV SOLN
0.4000 mg | Freq: Once | INTRAVENOUS | Status: DC
  Filled 2014-04-27: qty 5

## 2014-04-27 MED ORDER — VITAMIN D 1000 UNITS PO TABS
2000.0000 [IU] | ORAL_TABLET | Freq: Every day | ORAL | Status: DC
  Administered 2014-04-27 – 2014-04-29 (×3): 2000 [IU] via ORAL
  Filled 2014-04-27 (×3): qty 2

## 2014-04-27 MED ORDER — REGADENOSON 0.4 MG/5ML IV SOLN
INTRAVENOUS | Status: AC
  Administered 2014-04-27: 0.4 mg via INTRAVENOUS
  Filled 2014-04-27: qty 5

## 2014-04-27 MED ORDER — METRONIDAZOLE 0.75 % EX GEL
Freq: Two times a day (BID) | CUTANEOUS | Status: DC
  Administered 2014-04-27 – 2014-04-29 (×5): via TOPICAL
  Filled 2014-04-27: qty 45

## 2014-04-27 MED ORDER — CLOPIDOGREL BISULFATE 75 MG PO TABS
75.0000 mg | ORAL_TABLET | Freq: Every day | ORAL | Status: DC
  Administered 2014-04-27 – 2014-04-29 (×3): 75 mg via ORAL
  Filled 2014-04-27 (×3): qty 1

## 2014-04-27 MED ORDER — TECHNETIUM TC 99M SESTAMIBI GENERIC - CARDIOLITE
30.0000 | Freq: Once | INTRAVENOUS | Status: AC | PRN
  Administered 2014-04-27: 30 via INTRAVENOUS

## 2014-04-27 MED ORDER — REGADENOSON 0.4 MG/5ML IV SOLN
0.4000 mg | Freq: Once | INTRAVENOUS | Status: AC
  Administered 2014-04-27: 0.4 mg via INTRAVENOUS
  Filled 2014-04-27: qty 5

## 2014-04-27 MED ORDER — FAMOTIDINE 10 MG PO TABS
10.0000 mg | ORAL_TABLET | ORAL | Status: DC | PRN
  Filled 2014-04-27: qty 1

## 2014-04-27 MED ORDER — BRIMONIDINE TARTRATE 0.2 % OP SOLN
1.0000 [drp] | Freq: Two times a day (BID) | OPHTHALMIC | Status: DC
  Administered 2014-04-27 – 2014-04-28 (×3): 1 [drp] via OPHTHALMIC
  Filled 2014-04-27: qty 5

## 2014-04-27 MED ORDER — LISINOPRIL 10 MG PO TABS
10.0000 mg | ORAL_TABLET | Freq: Every day | ORAL | Status: DC
  Administered 2014-04-27 – 2014-04-29 (×3): 10 mg via ORAL
  Filled 2014-04-27 (×3): qty 1

## 2014-04-27 MED ORDER — GI COCKTAIL ~~LOC~~
30.0000 mL | Freq: Once | ORAL | Status: AC
  Administered 2014-04-27: 30 mL via ORAL
  Filled 2014-04-27: qty 30

## 2014-04-27 MED ORDER — ACETAMINOPHEN 325 MG PO TABS: 650.0000 mg | ORAL_TABLET | ORAL | Status: DC | PRN

## 2014-04-27 MED ORDER — ASPIRIN EC 325 MG PO TBEC
325.0000 mg | DELAYED_RELEASE_TABLET | Freq: Every day | ORAL | Status: DC
  Administered 2014-04-27 – 2014-04-28 (×2): 325 mg via ORAL
  Filled 2014-04-27 (×2): qty 1

## 2014-04-27 MED ORDER — LATANOPROST 0.005 % OP SOLN
1.0000 [drp] | Freq: Every day | OPHTHALMIC | Status: DC
  Administered 2014-04-27 – 2014-04-28 (×2): 1 [drp] via OPHTHALMIC
  Filled 2014-04-27: qty 2.5

## 2014-04-27 MED ORDER — ATENOLOL 25 MG PO TABS
25.0000 mg | ORAL_TABLET | Freq: Every day | ORAL | Status: DC
  Administered 2014-04-28 – 2014-04-29 (×2): 25 mg via ORAL
  Filled 2014-04-27 (×2): qty 1

## 2014-04-27 MED ORDER — TECHNETIUM TC 99M SESTAMIBI GENERIC - CARDIOLITE
10.0000 | Freq: Once | INTRAVENOUS | Status: AC | PRN
  Administered 2014-04-27: 10 via INTRAVENOUS

## 2014-04-27 MED ORDER — ENOXAPARIN SODIUM 40 MG/0.4ML ~~LOC~~ SOLN
40.0000 mg | SUBCUTANEOUS | Status: DC
  Administered 2014-04-27: 40 mg via SUBCUTANEOUS
  Filled 2014-04-27 (×2): qty 0.4

## 2014-04-27 MED ORDER — METRONIDAZOLE 0.75 % EX CREA
1.0000 "application " | TOPICAL_CREAM | Freq: Two times a day (BID) | CUTANEOUS | Status: DC
  Filled 2014-04-27: qty 45

## 2014-04-27 MED ORDER — ATENOLOL 25 MG PO TABS
25.0000 mg | ORAL_TABLET | Freq: Every day | ORAL | Status: DC
  Filled 2014-04-27: qty 1

## 2014-04-27 MED ORDER — NITROGLYCERIN 0.4 MG SL SUBL: 0.4000 mg | SUBLINGUAL_TABLET | SUBLINGUAL | Status: DC | PRN

## 2014-04-27 MED ORDER — MORPHINE SULFATE 2 MG/ML IJ SOLN: 2.0000 mg | INTRAMUSCULAR | Status: DC | PRN

## 2014-04-27 MED ORDER — ONDANSETRON HCL 4 MG/2ML IJ SOLN: 4.0000 mg | Freq: Four times a day (QID) | INTRAMUSCULAR | Status: DC | PRN

## 2014-04-27 NOTE — Progress Notes (Signed)
Patient Demographics  [name], is a 80 y.o. female, DOB - 1931/07/17, PYK:[id number]  Admit date - 04/26/2014   Admitting Physician Rise Patience, MD  Outpatient Primary MD for the patient is [name], MD  LOS - 1   Chief Complaint  Patient presents with  . Chest Pain        Subjective:   [name] today has, No headache, No chest pain, No abdominal pain,  +ve Heart burn and  Nausea, +ve diarrhea, No new weakness tingling or numbness, No Cough - SOB.    Assessment & Plan    1. Atypical  Chest pain - history of CAD and CABG, ruled out MI, chest pain free, continue aspirin, Plavix and if needed beta blocker as instructed by cardiology, due for stress test. Pain appears more GI in origin.   2. Heartburn and diarrhea. Likely some gastroenteritis and GERD, placed on GI cocktail and PPI, rule out C. Difficile.   3. Essential hypertension. Beta blocker once cleared by cardiology. Continue ACE, Stable.   4. Dyslipidemia. Outpatient follow-up with PCP. Resume [place] upon DC.     Code Status: Full  Family Communication: none  Disposition Plan: Home   Procedures    TTE   Myoview   Consults  Cards   Medications  Scheduled Meds: . aspirin EC  325 mg Oral Daily  . [START ON 04/28/2014] atenolol  25 mg Oral Daily  . brimonidine  1 drop Both Eyes BID  . cholecalciferol  2,000 Units Oral Daily  . clopidogrel  75 mg Oral Daily  . enoxaparin (LOVENOX) injection  40 mg Subcutaneous Q24H  . latanoprost  1 drop Both Eyes QHS  . lisinopril  10 mg Oral Daily  . metroNIDAZOLE   Topical BID  . pantoprazole  40 mg Oral Daily   Continuous Infusions: . sodium chloride     PRN Meds:.acetaminophen, morphine injection, nitroGLYCERIN, ondansetron (ZOFRAN)  IV  DVT Prophylaxis  Lovenox    Lab Results  Component Value Date   PLT 299 04/27/2014    Antibiotics     Anti-infectives    None          Objective:   Filed Vitals:   04/27/14 0019 04/27/14 0040 04/27/14 0100 04/27/14 0547  BP:  167/67 127/54 124/46  Pulse:  85 86 92  Temp: 97.8 F (36.6 C) 97.4 F (36.3 C) 97.8 F (36.6 C) 97.8 F (36.6 C)  TempSrc: Oral Oral Oral Oral  Resp:  18 16 18   Height:  5\' 6"  (1.676 m)    Weight:  64.093 kg (141 lb 4.8 oz)    SpO2:  99% 95% 98%    Wt Readings from Last 3 Encounters:  04/27/14 64.093 kg (141 lb 4.8 oz)  05/16/13 65.318 kg (144 lb)  05/12/13 65.409 kg (144 lb 3.2 oz)     Intake/Output Summary (Last 24 hours) at 04/27/14 1006 Last data filed at 04/27/14 0644  Gross per 24 hour  Intake      0 ml  Output    400 ml  Net   -400 ml     Physical Exam  Awake Alert, Oriented X 3, No new F.N deficits, Normal affect [place].AT,PERRAL Supple Neck,No JVD, No cervical  lymphadenopathy appriciated.  Symmetrical Chest wall movement, Good air movement bilaterally, CTAB RRR,No Gallops,Rubs or new Murmurs, No Parasternal Heave +ve B.Sounds, Abd Soft, No tenderness, No organomegaly appriciated, No rebound - guarding or rigidity. No Cyanosis, Clubbing or edema, No new Rash or bruise      Data Review   Micro Results No results found for this or any previous visit (from the past 240 hour(s)).  Radiology Reports Dg Chest 2 View  04/26/2014   CLINICAL DATA:  Chest pain for 4 hours.  History of prior CABG.  EXAM: CHEST  2 VIEW  COMPARISON:  PA and lateral chest 08/20/2010.  FINDINGS: The patient is status post CABG. Heart size is normal. Lungs are clear. No pneumothorax or pleural effusion.  IMPRESSION: No acute disease.   Electronically Signed   By: Inge Rise M.D.   On: 04/26/2014 19:49   Ct Angio Chest Pe W/cm &/or Wo Cm  04/26/2014   CLINICAL DATA:  Chest pain, upper abdominal pain beginning this afternoon while at rest.  Intermittent since thin. Prior CABG.  EXAM: CT ANGIOGRAPHY CHEST WITH CONTRAST  TECHNIQUE: Multidetector CT imaging of the chest was performed using the standard protocol during bolus administration of intravenous contrast. Multiplanar CT image reconstructions and MIPs were obtained to evaluate the vascular anatomy.  CONTRAST:  25mL OMNIPAQUE IOHEXOL 350 MG/ML SOLN  COMPARISON:  Chest x-ray 04/26/2014  FINDINGS: No filling defects in the pulmonary arteries to suggest pulmonary emboli. Minimal density in the inferior lingula, likely atelectasis. Lungs are otherwise clear. No pleural effusions.  Prior CABG. Heart is normal size. Aorta is normal caliber. No mediastinal, hilar, or axillary adenopathy. Chest wall soft tissues are unremarkable. Imaging into the upper abdomen shows no acute findings. Layering gallstones noted within the gallbladder.  Review of the MIP images confirms the above findings.  IMPRESSION: No evidence of pulmonary embolus.  Prior CABG.  Cholelithiasis.   Electronically Signed   By: Rolm Baptise M.D.   On: 04/26/2014 23:24     CBC  Recent Labs Lab 04/26/14 1902 04/27/14 0648  WBC 8.1 8.2  HGB 13.5 13.5  HCT 40.9 39.6  PLT 286 299  MCV 92.1 93.2  MCH 30.4 31.8  MCHC 33.0 34.1  RDW 13.0 13.2  LYMPHSABS  --  1.2  MONOABS  --  0.6  EOSABS  --  0.0  BASOSABS  --  0.0    Chemistries   Recent Labs Lab 04/26/14 1902 04/27/14 0648  NA 140 145  K 3.9 3.8  CL 107 110  CO2 26 22  GLUCOSE 126* 127*  BUN 18 15  CREATININE 0.88 0.78  CALCIUM 10.1 9.7  AST  --  21  ALT  --  12  ALKPHOS  --  67  BILITOT  --  0.6   ------------------------------------------------------------------------------------------------------------------ estimated creatinine clearance is 52.5 mL/min (by C-G formula based on Cr of 0.78). ------------------------------------------------------------------------------------------------------------------ No results for input(s): HGBA1C in the last 72  hours. ------------------------------------------------------------------------------------------------------------------ No results for input(s): CHOL, HDL, LDLCALC, TRIG, CHOLHDL, LDLDIRECT in the last 72 hours. ------------------------------------------------------------------------------------------------------------------ No results for input(s): TSH, T4TOTAL, T3FREE, THYROIDAB in the last 72 hours.  Invalid input(s): FREET3 ------------------------------------------------------------------------------------------------------------------ No results for input(s): VITAMINB12, FOLATE, FERRITIN, TIBC, IRON, RETICCTPCT in the last 72 hours.  Coagulation profile No results for input(s): INR, PROTIME in the last 168 hours.   Cardiac Enzymes  Recent Labs Lab 04/27/14 0230 04/27/14 0648  TROPONINI <0.03 <0.03   ------------------------------------------------------------------------------------------------------------------ Invalid input(s): POCBNP     Time  Spent in minutes   35   SINGH,PRASHANT K M.D on 04/27/2014 at 10:06 AM  Between 7am to 7pm - Pager - [phone]  After 7pm go to [email or web address] - [facility] Office  [phone]

## 2014-04-27 NOTE — Progress Notes (Signed)
UR completed 

## 2014-04-27 NOTE — Progress Notes (Signed)
  Echocardiogram 2D Echocardiogram has been performed.  [name] 04/27/2014, 12:20 PM

## 2014-04-27 NOTE — ED Notes (Signed)
Report attempted 

## 2014-04-27 NOTE — Consult Note (Signed)
Name: [name] is a 80 y.o. female Admit date: 04/26/2014 Referring Physician:  Hal Hope, MD Primary Physician:  Carol Ada, M.D. Primary Cardiologist:  Glenetta Hew, M.D.  Reason for Consultation:  Chest discomfort  ASSESSMENT: 1. Lower sternal chest discomfort occurring intermittently lasting up to 10 minutes and resolving. Currently pain-free and with negative cardiac markers. 2. Hypertension, essential 3. Hyperlipidemia, but statin intolerant  PLAN: 1. Hold beta blocker therapy 2. Stress Myoview 3. If moderate or high risk, would need coronary angiography. We need to compare this study with one done one year ago.   HPI: 80 year old female with history of coronary bypass grafting at the age of 33. She had LIMA to LAD, SVG to diagonal, and SVG to ramus intermedius. A nuclear study performed in January 2015 demonstrated an LVEF of 75%. The study was low risk for cardiac events. The study done at that time was a surveillance study given that age of her bypass grafts.  On this occasion the patient is admitted with recurring substernal chest discomfort. The discomfort is similar in location to complaints that were present prior to coronary bypass grafting in 2005. There is no exertional component. Episodes of discomfort have lasted up to 10 minutes. She has had no recurrence of discomfort since admission to the hospital. EKGs did not demonstrate any ischemic abnormality. Cardiac markers are normal 2. She has no significant outstanding risk factors other than hypertension and hyperlipidemia with statin intolerance. She is relatively active without symptoms or limitations due to heart failure.  PMH:   Past Medical History  Diagnosis Date  . Dyslipidemia     Statin intolerant  . Hypertension   . CAD in native artery   . Non-STEMI (non-ST elevated myocardial infarction) October 2005    EF by 35-40%, echo 40-50%. Angiography: 99% mid LAD involving D1 followed by 70% mid  LAD; 80% RI. --> CABG  . 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  [name] CAD (coronary artery disease), autologous vein bypass graft March 2008    Occluded SVG ID 1 and occluded SVG-RI.  [name] Statin intolerance     PSH:   Past Surgical History  Procedure Laterality Date  . Transthoracic echocardiogram  02/19/2004    EF 45-50%, Normal LV function, moderate hypokinesis of anterior wall.  . Cardiac catheterization  02/19/2004    Significant 2 vessel CAD - LAD, D1 and RI  . Coronary artery bypass graft  02/22/2004    x3. LIMA to distal LAD, SVG to first diag, SVG to ramus. SVG harvest from rt thigh.  . Cardiovascular stress test  05/23/2006    Mild lateral/inferolateral ischemia, likely due to occluded SVGs with exhisting disease.  . Cardiac catheterization  07/25/2006    Totally occluded SVG to diag and ramus. Patent LIMA-LAD. Ramus 70-80% proximal stenosis and occluded vein graft.  . Carotid doppler  07/15/2009    Bilat ICAs 0-49% diameter reduction. Normal patency of Bilat subclavian arteries.  . Nm myoview ltd  January 2008    Referred for For mild inferolateral and anterolateral/apical lateral defect with mild reversibility.   Allergies:  Nsaids; Statins; Tricor; and Zetia Prior to Admit Meds:   Prescriptions prior to admission  Medication Sig Dispense Refill Last Dose  . aspirin EC 81 MG tablet Take 81 mg by mouth daily.   04/26/2014 at Unknown time  . atenolol (TENORMIN) 25 MG tablet Take 1 tablet (25 mg total) by mouth daily. 30 tablet 11 04/25/2014 at  1800  . bimatoprost (LUMIGAN) 0.03 % ophthalmic solution Place 1 drop into both eyes at bedtime.   04/25/2014 at Unknown time  . brimonidine (ALPHAGAN) 0.2 % ophthalmic solution Place 1 drop into both eyes 2 (two) times daily.   04/25/2014 at Unknown time  . Cholecalciferol (VITAMIN D3) 2000 UNITS capsule Take 2,000 Units by mouth daily.   04/26/2014 at Unknown time  . clopidogrel (PLAVIX) 75 MG tablet TAKE 1 TABLET BY MOUTH  EVERY DAY 30 tablet 1 04/26/2014 at Unknown time  . famotidine (PEPCID AC) 10 MG chewable tablet Chew 10 mg by mouth as needed for heartburn.   04/26/2014 at Unknown time  . FLUZONE HIGH-DOSE 0.5 ML SUSY Inject 0.5 mLs as directed once.  0 Past Month at Unknown time  . lisinopril (PRINIVIL,ZESTRIL) 10 MG tablet TAKE 1 TABLET BY MOUTH EVERY DAY 30 tablet 1 04/26/2014 at Unknown time  . metroNIDAZOLE (METROCREAM) 0.75 % cream Apply 1 application topically 2 (two) times daily.   Past Week at Unknown time  . triamcinolone cream (KENALOG) 0.1 % Apply 1 application topically as needed.   Past Week at Unknown time  . Pitavastatin Calcium (LIVALO) 2 MG TABS Take 0.5 tablets (1 mg total) by mouth 2 (two) times a week. (Patient not taking: Reported on 04/26/2014) 21 tablet 0 Not Taking at Unknown time  . Pitavastatin Calcium 1 MG TABS Take 1/2 tablet once a week for month ,then1/2 tablet twice a week (Patient not taking: Reported on 04/26/2014) 30 tablet 3 Not Taking at Unknown time   Fam HX:    Family History  Problem Relation Age of Onset  . Alzheimer's disease Mother   . Alzheimer's disease Sister   . Hyperlipidemia Sister   . Hypertension Sister   . Diabetes Sister   . Hyperlipidemia Sister   . Hypertension Sister   . Pancreatitis Child    Social HX:    History   Social History  . Marital Status: Widowed    Spouse Name: N/A    Number of Children: N/A  . Years of Education: N/A   Occupational History  . Not on file.   Social History Main Topics  . Smoking status: Never Smoker   . Smokeless tobacco: Never Used  . Alcohol Use: 0.5 oz/week    1 drink(s) per week  . Drug Use: Not on file  . Sexual Activity: Not on file   Other Topics Concern  . Not on file   Social History Narrative   She is a widowed, mother of 2 grandmother of 3. She used to do exercising through the [employer] program as well as the [employer]. Unfortunately, this finding of the classes for seniors changed,  and she was unable to make the new classes. She did not like exercising with non-seniors. Besides that she does existing disease, walks up and down the stairs, walk her dog. She is not as active as he had been before.   She never smoked, never drank alcohol.           Review of Systems: Denies claudication. No orthopnea, PND, lower extremity edema, palpitations, cough, or syncope. No history of lung disease. Appetite is been stable. No transient neurological symptoms.  Physical Exam: Blood pressure 124/46, pulse 92, temperature 97.8 F (36.6 C), temperature source Oral, resp. rate 18, height 5\' 6"  (1.676 m), weight 141 lb 4.8 oz (64.093 kg), SpO2 98 %. Weight change:    Somewhat pale appearing but in no distress. Chest  discomfort has not recurred in over 6 hours. HEENT exam reveals pupils are equal and reactive to light. No conjunctival pallor is noted. Neck exam is without carotid bruits or jugular vein distention Chest is clear to auscultation and percussion Cardiac exam reveals a soft apical systolic murmur. No gallop is heard. Abdomen is soft. Bowel sounds are normal. Extremities reveal no edema. Pulses are 2+ and symmetric. The neurological exam reveals no focal abnormality. She has normal affect Labs: Lab Results  Component Value Date   WBC 8.2 04/27/2014   HGB 13.5 04/27/2014   HCT 39.6 04/27/2014   MCV 93.2 04/27/2014   PLT 299 04/27/2014    Recent Labs Lab 04/27/14 0648  NA 145  K 3.8  CL 110  CO2 22  BUN 15  CREATININE 0.78  CALCIUM 9.7  PROT 6.3  BILITOT 0.6  ALKPHOS 67  ALT 12  AST 21  GLUCOSE 127*   No results found for: PTT No results found for: INR, PROTIME Lab Results  Component Value Date   TROPONINI <0.03 04/27/2014       Radiology:  Dg Chest 2 View  04/26/2014   CLINICAL DATA:  Chest pain for 4 hours.  History of prior CABG.  EXAM: CHEST  2 VIEW  COMPARISON:  PA and lateral chest 08/20/2010.  FINDINGS: The patient is status post CABG. Heart  size is normal. Lungs are clear. No pneumothorax or pleural effusion.  IMPRESSION: No acute disease.   Electronically Signed   By: Inge Rise M.D.   On: 04/26/2014 19:49   Ct Angio Chest Pe W/cm &/or Wo Cm  04/26/2014   CLINICAL DATA:  Chest pain, upper abdominal pain beginning this afternoon while at rest. Intermittent since thin. Prior CABG.  EXAM: CT ANGIOGRAPHY CHEST WITH CONTRAST  TECHNIQUE: Multidetector CT imaging of the chest was performed using the standard protocol during bolus administration of intravenous contrast. Multiplanar CT image reconstructions and MIPs were obtained to evaluate the vascular anatomy.  CONTRAST:  73mL OMNIPAQUE IOHEXOL 350 MG/ML SOLN  COMPARISON:  Chest x-ray 04/26/2014  FINDINGS: No filling defects in the pulmonary arteries to suggest pulmonary emboli. Minimal density in the inferior lingula, likely atelectasis. Lungs are otherwise clear. No pleural effusions.  Prior CABG. Heart is normal size. Aorta is normal caliber. No mediastinal, hilar, or axillary adenopathy. Chest wall soft tissues are unremarkable. Imaging into the upper abdomen shows no acute findings. Layering gallstones noted within the gallbladder.  Review of the MIP images confirms the above findings.  IMPRESSION: No evidence of pulmonary embolus.  Prior CABG.  Cholelithiasis.   Electronically Signed   By: Rolm Baptise M.D.   On: 04/26/2014 23:24    EKG:   Normal sinus rhythm with nonspecific T-wave flattening    Sinclair Grooms 04/27/2014 8:41 AM

## 2014-04-27 NOTE — H&P (Signed)
[facility] History and Physical  [name] KGM:[id number] DOB: 14-Oct-1931 DOA: 04/26/2014  Referring physician: ER physician. PCP: [name], MD   Chief Complaint: Chest pain.  HPI: [name] is a 80 y.o. female with history of CAD status post CABG, hypertension and hyperlipidemia presents to the ER because of chest pain. Patient states her chest pain started last evening while at rest. Pain was retrosternal pressure-like nonradiating and also patient had mild abdominal discomfort at same time. Denies any nausea vomiting diarrhea shortness of breath diaphoresis. Pain lasted for a few minutes but recurred. By the time patient ER patient was chest pain-free. EKG and cardiac markers were unremarkable. The patient's d-dimer was elevated CT angiogram of the chest was done which was negative for PE. Patient has been admitted for further workup for her chest pain given her history of CAD. Patient did have a stress test done in January this year which was low risk for ischemia.  Review of Systems: As presented in the history of presenting illness, rest negative.  Past Medical History  Diagnosis Date  . Dyslipidemia     Statin intolerant  . Hypertension   . CAD in native artery   . Non-STEMI (non-ST elevated myocardial infarction) October 2005    EF by 35-40%, echo 40-50%. Angiography: 99% mid LAD involving D1 followed by 70% mid LAD; 80% RI. --> CABG  . 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  [name] CAD (coronary artery disease), autologous vein bypass graft March 2008    Occluded SVG ID 1 and occluded SVG-RI.  [name] Statin intolerance    Past Surgical History  Procedure Laterality Date  . Transthoracic echocardiogram  02/19/2004    EF 45-50%, Normal LV function, moderate hypokinesis of anterior wall.  . Cardiac catheterization  02/19/2004    Significant 2 vessel CAD - LAD, D1 and RI  . Coronary artery bypass graft  02/22/2004    x3. LIMA to distal  LAD, SVG to first diag, SVG to ramus. SVG harvest from rt thigh.  . Cardiovascular stress test  05/23/2006    Mild lateral/inferolateral ischemia, likely due to occluded SVGs with exhisting disease.  . Cardiac catheterization  07/25/2006    Totally occluded SVG to diag and ramus. Patent LIMA-LAD. Ramus 70-80% proximal stenosis and occluded vein graft.  . Carotid doppler  07/15/2009    Bilat ICAs 0-49% diameter reduction. Normal patency of Bilat subclavian arteries.  . Nm myoview ltd  January 2008    Referred for For mild inferolateral and anterolateral/apical lateral defect with mild reversibility.   Social History:  reports that she has never smoked. She has never used smokeless tobacco. She reports that she drinks about 0.5 oz of alcohol per week. Her drug history is not on file. Where does patient live home. Can patient participate in ADLs? Yes.  Allergies  Allergen Reactions  . Nsaids   . Statins   . Tricor [Fenofibrate]   . Zetia [Ezetimibe]     Family History:  Family History  Problem Relation Age of Onset  . Alzheimer's disease Mother   . Alzheimer's disease Sister   . Hyperlipidemia Sister   . Hypertension Sister   . Diabetes Sister   . Hyperlipidemia Sister   . Hypertension Sister   . Pancreatitis Child       Prior to Admission medications   Medication Sig Start Date End Date Taking? Authorizing Provider  aspirin EC 81 MG tablet Take 81 mg by  mouth daily.   Yes Historical Provider, MD  atenolol (TENORMIN) 25 MG tablet Take 1 tablet (25 mg total) by mouth daily. 09/24/13  Yes Leonie Man, MD  bimatoprost (LUMIGAN) 0.03 % ophthalmic solution Place 1 drop into both eyes at bedtime.   Yes Historical Provider, MD  brimonidine (ALPHAGAN) 0.2 % ophthalmic solution Place 1 drop into both eyes 2 (two) times daily.   Yes Historical Provider, MD  Cholecalciferol (VITAMIN D3) 2000 UNITS capsule Take 2,000 Units by mouth daily.   Yes Historical Provider, MD  clopidogrel (PLAVIX)  75 MG tablet TAKE 1 TABLET BY MOUTH EVERY DAY 04/23/14  Yes Leonie Man, MD  famotidine (PEPCID AC) 10 MG chewable tablet Chew 10 mg by mouth as needed for heartburn.   Yes Historical Provider, MD  FLUZONE HIGH-DOSE 0.5 ML SUSY Inject 0.5 mLs as directed once. 02/01/14  Yes Historical Provider, MD  lisinopril (PRINIVIL,ZESTRIL) 10 MG tablet TAKE 1 TABLET BY MOUTH EVERY DAY 04/23/14  Yes Leonie Man, MD  metroNIDAZOLE (METROCREAM) 0.75 % cream Apply 1 application topically 2 (two) times daily. 04/17/13  Yes Historical Provider, MD  triamcinolone cream (KENALOG) 0.1 % Apply 1 application topically as needed. 03/31/13  Yes Historical Provider, MD  Pitavastatin Calcium (LIVALO) 2 MG TABS Take 0.5 tablets (1 mg total) by mouth 2 (two) times a week. Patient not taking: Reported on 04/26/2014 05/22/13   Leonie Man, MD  Pitavastatin Calcium 1 MG TABS Take 1/2 tablet once a week for month ,then1/2 tablet twice a week Patient not taking: Reported on 04/26/2014 05/12/13   Leonie Man, MD    Physical Exam: Filed Vitals:   04/26/14 2215 04/26/14 2230 04/26/14 2245 04/26/14 2330  BP: 123/48 139/66 134/74 149/61  Pulse: 74 74 79 87  Temp:      TempSrc:      Resp: 21 18 18 21   SpO2: 97% 97% 97% 97%     General:  Moderately built and nourished.  Eyes: Anicteric no pallor.  ENT: No discharge from the ears eyes nose or mouth.  Neck: No mass felt.  Cardiovascular: S1 and S2 heard.  Respiratory: No rhonchi or crepitations.  Abdomen: Soft nontender bowel sounds present. No guarding or rigidity.  Skin: No rash.  Musculoskeletal: No edema.  Psychiatric: Appears normal.  Neurologic: Alert awake oriented to time place and person. Moves all extremities.  Labs on Admission:  Basic Metabolic Panel:  Recent Labs Lab 04/26/14 1902  NA 140  K 3.9  CL 107  CO2 26  GLUCOSE 126*  BUN 18  CREATININE 0.88  CALCIUM 10.1   Liver Function Tests: No results for input(s): AST, ALT,  ALKPHOS, BILITOT, PROT, ALBUMIN in the last 168 hours. No results for input(s): LIPASE, AMYLASE in the last 168 hours. No results for input(s): AMMONIA in the last 168 hours. CBC:  Recent Labs Lab 04/26/14 1902  WBC 8.1  HGB 13.5  HCT 40.9  MCV 92.1  PLT 286   Cardiac Enzymes: No results for input(s): CKTOTAL, CKMB, CKMBINDEX, TROPONINI in the last 168 hours.  BNP (last 3 results) No results for input(s): PROBNP in the last 8760 hours. CBG: No results for input(s): GLUCAP in the last 168 hours.  Radiological Exams on Admission: Dg Chest 2 View  04/26/2014   CLINICAL DATA:  Chest pain for 4 hours.  History of prior CABG.  EXAM: CHEST  2 VIEW  COMPARISON:  PA and lateral chest 08/20/2010.  FINDINGS: The patient is  status post CABG. Heart size is normal. Lungs are clear. No pneumothorax or pleural effusion.  IMPRESSION: No acute disease.   Electronically Signed   By: Inge Rise M.D.   On: 04/26/2014 19:49   Ct Angio Chest Pe W/cm &/or Wo Cm  04/26/2014   CLINICAL DATA:  Chest pain, upper abdominal pain beginning this afternoon while at rest. Intermittent since thin. Prior CABG.  EXAM: CT ANGIOGRAPHY CHEST WITH CONTRAST  TECHNIQUE: Multidetector CT imaging of the chest was performed using the standard protocol during bolus administration of intravenous contrast. Multiplanar CT image reconstructions and MIPs were obtained to evaluate the vascular anatomy.  CONTRAST:  51mL OMNIPAQUE IOHEXOL 350 MG/ML SOLN  COMPARISON:  Chest x-ray 04/26/2014  FINDINGS: No filling defects in the pulmonary arteries to suggest pulmonary emboli. Minimal density in the inferior lingula, likely atelectasis. Lungs are otherwise clear. No pleural effusions.  Prior CABG. Heart is normal size. Aorta is normal caliber. No mediastinal, hilar, or axillary adenopathy. Chest wall soft tissues are unremarkable. Imaging into the upper abdomen shows no acute findings. Layering gallstones noted within the gallbladder.   Review of the MIP images confirms the above findings.  IMPRESSION: No evidence of pulmonary embolus.  Prior CABG.  Cholelithiasis.   Electronically Signed   By: Rolm Baptise M.D.   On: 04/26/2014 23:24    EKG: Independently reviewed. Normal sinus rhythm.  Assessment/Plan Principal Problem:   Chest pain Active Problems:   S/P CABG x 3   CAD (coronary artery disease), autologous vein bypass graft   Hypertension   Hyperlipidemia LDL goal <70   1. Chest pain - with history of CAD status post CABG concerning for ACS. We will cycle cardiac markers check 2-D echo continue antiplatelet agents and when necessary nitroglycerin. Patient will be kept nothing by mouth past for a.m. in anticipation of possible cardiac procedures. Stress test done in January of this year was low risk for ischemia. 2. Cholelithiasis per CAT scan - patient has no right upper quadrant tenderness and has no pain while eating. We will check LFTs. 3. Hypertension - continue home medications. 4. Hyperlipidemia - continue home medications.   DVT Prophylaxis Lovenox.  Code Status: Full code.  Family Communication: Patient's daughter at the bedside.  Disposition Plan: Admit for observation.    Zakeria Kulzer N. [facility] Pager [phone].  If 7PM-7AM, please contact night-coverage [email or web address] Password [facility] 04/27/2014, 12:15 AM

## 2014-04-28 ENCOUNTER — Encounter (HOSPITAL_COMMUNITY)
Admission: EM | Disposition: A | Discharge status: Home / Self Care | Payer: Self-pay | Source: Home / Self Care | Attending: Emergency Medicine

## 2014-04-28 DIAGNOSIS — E785 Hyperlipidemia, unspecified: Secondary | ICD-10-CM | POA: Diagnosis not present

## 2014-04-28 DIAGNOSIS — R0789 Other chest pain: Secondary | ICD-10-CM | POA: Diagnosis not present

## 2014-04-28 DIAGNOSIS — I2510 Atherosclerotic heart disease of native coronary artery without angina pectoris: Secondary | ICD-10-CM

## 2014-04-28 DIAGNOSIS — I10 Essential (primary) hypertension: Secondary | ICD-10-CM | POA: Diagnosis not present

## 2014-04-28 DIAGNOSIS — I25710 Atherosclerosis of autologous vein coronary artery bypass graft(s) with unstable angina pectoris: Secondary | ICD-10-CM

## 2014-04-28 HISTORY — PX: LEFT HEART CATHETERIZATION WITH CORONARY/GRAFT ANGIOGRAM: SHX5450

## 2014-04-28 LAB — PROTIME-INR
INR: 1.04 (ref 0.00–1.49)
Prothrombin Time: 13.7 seconds (ref 11.6–15.2)

## 2014-04-28 SURGERY — LEFT HEART CATHETERIZATION WITH CORONARY/GRAFT ANGIOGRAM
Anesthesia: LOCAL

## 2014-04-28 MED ORDER — LIDOCAINE HCL (PF) 1 % IJ SOLN
INTRAMUSCULAR | Status: AC
  Filled 2014-04-28: qty 30

## 2014-04-28 MED ORDER — OXYCODONE-ACETAMINOPHEN 5-325 MG PO TABS: 1.0000 | ORAL_TABLET | ORAL | Status: DC | PRN

## 2014-04-28 MED ORDER — FENTANYL CITRATE 0.05 MG/ML IJ SOLN
INTRAMUSCULAR | Status: AC
  Filled 2014-04-28: qty 2

## 2014-04-28 MED ORDER — NITROGLYCERIN 0.2 MG/ML ON CALL CATH LAB
INTRAVENOUS | Status: AC
  Filled 2014-04-28: qty 1

## 2014-04-28 MED ORDER — ASPIRIN 81 MG PO CHEW: 81.0000 mg | CHEWABLE_TABLET | ORAL | Status: DC

## 2014-04-28 MED ORDER — ACETAMINOPHEN 325 MG PO TABS: 650.0000 mg | ORAL_TABLET | ORAL | Status: DC | PRN

## 2014-04-28 MED ORDER — SODIUM CHLORIDE 0.9 % IV SOLN
1.0000 mL/kg/h | INTRAVENOUS | Status: DC
  Administered 2014-04-28: 1 mL/kg/h via INTRAVENOUS

## 2014-04-28 MED ORDER — MIDAZOLAM HCL 2 MG/2ML IJ SOLN
INTRAMUSCULAR | Status: AC
  Filled 2014-04-28: qty 2

## 2014-04-28 MED ORDER — SODIUM CHLORIDE 0.9 % IJ SOLN
3.0000 mL | Freq: Two times a day (BID) | INTRAMUSCULAR | Status: DC
  Administered 2014-04-28: 3 mL via INTRAVENOUS

## 2014-04-28 MED ORDER — ENOXAPARIN SODIUM 40 MG/0.4ML ~~LOC~~ SOLN
40.0000 mg | SUBCUTANEOUS | Status: DC
  Administered 2014-04-29: 40 mg via SUBCUTANEOUS
  Filled 2014-04-28 (×2): qty 0.4

## 2014-04-28 MED ORDER — SODIUM CHLORIDE 0.9 % IV SOLN: INTRAVENOUS | Status: AC

## 2014-04-28 MED ORDER — ONDANSETRON HCL 4 MG/2ML IJ SOLN: 4.0000 mg | Freq: Four times a day (QID) | INTRAMUSCULAR | Status: DC | PRN

## 2014-04-28 MED ORDER — SODIUM CHLORIDE 0.9 % IV SOLN: 250.0000 mL | INTRAVENOUS | Status: DC | PRN

## 2014-04-28 MED ORDER — HEPARIN (PORCINE) IN NACL 2-0.9 UNIT/ML-% IJ SOLN
INTRAMUSCULAR | Status: AC
  Filled 2014-04-28: qty 1000

## 2014-04-28 MED ORDER — SODIUM CHLORIDE 0.9 % IJ SOLN: 3.0000 mL | INTRAMUSCULAR | Status: DC | PRN

## 2014-04-28 NOTE — Progress Notes (Signed)
Site area: RFA Site Prior to Removal:  Level 0 Pressure Applied For:78min Manual:   yes Patient Status During Pull:  stable Post Pull Site:  Level 0 Post Pull Instructions Given: yes  Post Pull Pulses Present: palpable Dressing Applied:  clear Bedrest begins @ 1438 Comments:

## 2014-04-28 NOTE — H&P (View-Only) (Signed)
Subjective:   On this occasion the patient is admitted with recurring substernal chest discomfort. The discomfort is similar in location to complaints that were present prior to coronary bypass grafting in 2005. There is no exertional component. Episodes of discomfort have lasted up to 10 minutes. She has had no recurrence of discomfort since admission to the hospital No further CP. NUC stress abnormal.   Objective:  Vital Signs in the last 24 hours: Temp:  [97.3 F (36.3 C)-98.2 F (36.8 C)] 97.3 F (36.3 C) (12/29 0523) Pulse Rate:  [71-93] 71 (12/29 0523) Resp:  [18] 18 (12/29 0523) BP: (104-115)/(42-55) 113/55 mmHg (12/29 0523) SpO2:  [95 %] 95 % (12/29 0523) Weight:  [138 lb 10.7 oz (62.9 kg)] 138 lb 10.7 oz (62.9 kg) (12/29 0523)  Intake/Output from previous day: 12/28 0701 - 12/29 0700 In: 240 [P.O.:240] Out: 225 [Urine:225]   Physical Exam: General: Well developed, well nourished, in no acute distress, mild pale appearance (Hg 13.5) Head:  Normocephalic and atraumatic. Lungs: Clear to auscultation and percussion. Heart: Normal S1 and S2.  Soft systolic murmur, no rubs or gallops.  Abdomen: soft, non-tender, positive bowel sounds. Extremities: No clubbing or cyanosis. No edema. Neurologic: Alert and oriented x 3.    Lab Results:  Recent Labs  04/26/14 1902 04/27/14 0648  WBC 8.1 8.2  HGB 13.5 13.5  PLT 286 299    Recent Labs  04/26/14 1902 04/27/14 0648  NA 140 145  K 3.9 3.8  CL 107 110  CO2 26 22  GLUCOSE 126* 127*  BUN 18 15  CREATININE 0.88 0.78    Recent Labs  04/27/14 0648 04/27/14 1610  TROPONINI <0.03 <0.03   Hepatic Function Panel  Recent Labs  04/27/14 0648  PROT 6.3  ALBUMIN 3.7  AST 21  ALT 12  ALKPHOS 67  BILITOT 0.6   Imaging: Dg Chest 2 View  04/26/2014   CLINICAL DATA:  Chest pain for 4 hours.  History of prior CABG.  EXAM: CHEST  2 VIEW  COMPARISON:  PA and lateral chest 08/20/2010.  FINDINGS: The patient is  status post CABG. Heart size is normal. Lungs are clear. No pneumothorax or pleural effusion.  IMPRESSION: No acute disease.   Electronically Signed   By: Inge Rise M.D.   On: 04/26/2014 19:49   Ct Angio Chest Pe W/cm &/or Wo Cm  04/26/2014   CLINICAL DATA:  Chest pain, upper abdominal pain beginning this afternoon while at rest. Intermittent since thin. Prior CABG.  EXAM: CT ANGIOGRAPHY CHEST WITH CONTRAST  TECHNIQUE: Multidetector CT imaging of the chest was performed using the standard protocol during bolus administration of intravenous contrast. Multiplanar CT image reconstructions and MIPs were obtained to evaluate the vascular anatomy.  CONTRAST:  75mL OMNIPAQUE IOHEXOL 350 MG/ML SOLN  COMPARISON:  Chest x-ray 04/26/2014  FINDINGS: No filling defects in the pulmonary arteries to suggest pulmonary emboli. Minimal density in the inferior lingula, likely atelectasis. Lungs are otherwise clear. No pleural effusions.  Prior CABG. Heart is normal size. Aorta is normal caliber. No mediastinal, hilar, or axillary adenopathy. Chest wall soft tissues are unremarkable. Imaging into the upper abdomen shows no acute findings. Layering gallstones noted within the gallbladder.  Review of the MIP images confirms the above findings.  IMPRESSION: No evidence of pulmonary embolus.  Prior CABG.  Cholelithiasis.   Electronically Signed   By: Rolm Baptise M.D.   On: 04/26/2014 23:24   Nm Myocar Multi W/spect W/wall Motion /  Ef  04/27/2014   CLINICAL DATA:  Hypertension, coronary artery disease, CABG and dyslipidemia. Now with chest pain of uncertain etiology.  EXAM: MYOCARDIAL IMAGING WITH SPECT (REST AND PHARMACOLOGIC-STRESS)  GATED LEFT VENTRICULAR WALL MOTION STUDY  LEFT VENTRICULAR EJECTION FRACTION  TECHNIQUE: Standard myocardial SPECT imaging was performed after resting intravenous injection of 10 mCi Tc-23m sestamibi. Subsequently, intravenous infusion of Lexiscan was performed under the supervision of the  Cardiology staff. At peak effect of the drug, 30 mCi Tc-49m sestamibi was injected intravenously and standard myocardial SPECT imaging was performed. Quantitative gated imaging was also performed to evaluate left ventricular wall motion, and estimate left ventricular ejection fraction.  COMPARISON:  None.  FINDINGS: Perfusion: There is a large reversible defect involving the anterior and inferior lateral wall. No fixed defects identified.  Wall Motion: Normal left ventricular wall motion. No left ventricular dilation.  Left Ventricular Ejection Fraction: 92 %  End diastolic volume 45 ml  End systolic volume 4 ml  IMPRESSION: 1. Large area of moderate reversibility involving the lateral wall.  2. Normal left ventricular wall motion.  3. Left ventricular ejection fraction 92%  4. High-risk stress test findings*.  *2012 Appropriate Use Criteria for Coronary Revascularization Focused Update: J Am Coll Cardiol. 5993;57(0):177-939. http://content.[email or web address].aspx?articleid=1201161   Electronically Signed   By: Kerby Moors M.D.   On: 04/27/2014 16:22   Personally viewed.   Telemetry: no adverse rhythms Personally viewed.   EKG: NSSTW changes, precordial  Cardiac Studies:  NUC - high risk, lateral wall ischemia, EF normal. This is a change from normal perfusion on last NUC stress.   Scheduled Meds: . aspirin EC  325 mg Oral Daily  . atenolol  25 mg Oral Daily  . brimonidine  1 drop Both Eyes BID  . cholecalciferol  2,000 Units Oral Daily  . clopidogrel  75 mg Oral Daily  . enoxaparin (LOVENOX) injection  40 mg Subcutaneous Q24H  . latanoprost  1 drop Both Eyes QHS  . lisinopril  10 mg Oral Daily  . metroNIDAZOLE   Topical BID  . pantoprazole  40 mg Oral Daily  . regadenoson  0.4 mg Intravenous Once   Continuous Infusions:  PRN Meds:.acetaminophen, morphine injection, nitroGLYCERIN, ondansetron (ZOFRAN) IV   Assessment/Plan:  Principal Problem:   Chest pain Active Problems:   S/P  CABG x 3   CAD (coronary artery disease), autologous vein bypass graft   Hypertension   Hyperlipidemia LDL goal <42  80 year old with CAD post CABG with patent LIMA to LAD, occluded SVG to Diag, SVG -ramus, with high risk stress test, lateral wall ischemia. Normal EF.   -Heart cath today. She ate half of her breakfast around 8 AM. NPO now. Risks and benefits of stroke, MI , bleeding, renal impairment discussed. Proceed.   -On ASA 81 and Plavix, Bb, ACE-I  -Note occluded vein grafts x 2  -HTN - well controlled  -HL - statin intolerant   Toryn Dewalt 04/28/2014, 9:29 AM

## 2014-04-28 NOTE — Progress Notes (Addendum)
Subjective:   On this occasion the patient is admitted with recurring substernal chest discomfort. The discomfort is similar in location to complaints that were present prior to coronary bypass grafting in 2005. There is no exertional component. Episodes of discomfort have lasted up to 10 minutes. She has had no recurrence of discomfort since admission to the hospital No further CP. NUC stress abnormal.   Objective:  Vital Signs in the last 24 hours: Temp:  [97.3 F (36.3 C)-98.2 F (36.8 C)] 97.3 F (36.3 C) (12/29 0523) Pulse Rate:  [71-93] 71 (12/29 0523) Resp:  [18] 18 (12/29 0523) BP: (104-115)/(42-55) 113/55 mmHg (12/29 0523) SpO2:  [95 %] 95 % (12/29 0523) Weight:  [138 lb 10.7 oz (62.9 kg)] 138 lb 10.7 oz (62.9 kg) (12/29 0523)  Intake/Output from previous day: 12/28 0701 - 12/29 0700 In: 240 [P.O.:240] Out: 225 [Urine:225]   Physical Exam: General: Well developed, well nourished, in no acute distress, mild pale appearance (Hg 13.5) Head:  Normocephalic and atraumatic. Lungs: Clear to auscultation and percussion. Heart: Normal S1 and S2.  Soft systolic murmur, no rubs or gallops.  Abdomen: soft, non-tender, positive bowel sounds. Extremities: No clubbing or cyanosis. No edema. Neurologic: Alert and oriented x 3.    Lab Results:  Recent Labs  04/26/14 1902 04/27/14 0648  WBC 8.1 8.2  HGB 13.5 13.5  PLT 286 299    Recent Labs  04/26/14 1902 04/27/14 0648  NA 140 145  K 3.9 3.8  CL 107 110  CO2 26 22  GLUCOSE 126* 127*  BUN 18 15  CREATININE 0.88 0.78    Recent Labs  04/27/14 0648 04/27/14 1610  TROPONINI <0.03 <0.03   Hepatic Function Panel  Recent Labs  04/27/14 0648  PROT 6.3  ALBUMIN 3.7  AST 21  ALT 12  ALKPHOS 67  BILITOT 0.6   Imaging: Dg Chest 2 View  04/26/2014   CLINICAL DATA:  Chest pain for 4 hours.  History of prior CABG.  EXAM: CHEST  2 VIEW  COMPARISON:  PA and lateral chest 08/20/2010.  FINDINGS: The patient is  status post CABG. Heart size is normal. Lungs are clear. No pneumothorax or pleural effusion.  IMPRESSION: No acute disease.   Electronically Signed   By: Inge Rise M.D.   On: 04/26/2014 19:49   Ct Angio Chest Pe W/cm &/or Wo Cm  04/26/2014   CLINICAL DATA:  Chest pain, upper abdominal pain beginning this afternoon while at rest. Intermittent since thin. Prior CABG.  EXAM: CT ANGIOGRAPHY CHEST WITH CONTRAST  TECHNIQUE: Multidetector CT imaging of the chest was performed using the standard protocol during bolus administration of intravenous contrast. Multiplanar CT image reconstructions and MIPs were obtained to evaluate the vascular anatomy.  CONTRAST:  59mL OMNIPAQUE IOHEXOL 350 MG/ML SOLN  COMPARISON:  Chest x-ray 04/26/2014  FINDINGS: No filling defects in the pulmonary arteries to suggest pulmonary emboli. Minimal density in the inferior lingula, likely atelectasis. Lungs are otherwise clear. No pleural effusions.  Prior CABG. Heart is normal size. Aorta is normal caliber. No mediastinal, hilar, or axillary adenopathy. Chest wall soft tissues are unremarkable. Imaging into the upper abdomen shows no acute findings. Layering gallstones noted within the gallbladder.  Review of the MIP images confirms the above findings.  IMPRESSION: No evidence of pulmonary embolus.  Prior CABG.  Cholelithiasis.   Electronically Signed   By: Rolm Baptise M.D.   On: 04/26/2014 23:24   Nm Myocar Multi W/spect W/wall Motion /  Ef  04/27/2014   CLINICAL DATA:  Hypertension, coronary artery disease, CABG and dyslipidemia. Now with chest pain of uncertain etiology.  EXAM: MYOCARDIAL IMAGING WITH SPECT (REST AND PHARMACOLOGIC-STRESS)  GATED LEFT VENTRICULAR WALL MOTION STUDY  LEFT VENTRICULAR EJECTION FRACTION  TECHNIQUE: Standard myocardial SPECT imaging was performed after resting intravenous injection of 10 mCi Tc-76m sestamibi. Subsequently, intravenous infusion of Lexiscan was performed under the supervision of the  Cardiology staff. At peak effect of the drug, 30 mCi Tc-8m sestamibi was injected intravenously and standard myocardial SPECT imaging was performed. Quantitative gated imaging was also performed to evaluate left ventricular wall motion, and estimate left ventricular ejection fraction.  COMPARISON:  None.  FINDINGS: Perfusion: There is a large reversible defect involving the anterior and inferior lateral wall. No fixed defects identified.  Wall Motion: Normal left ventricular wall motion. No left ventricular dilation.  Left Ventricular Ejection Fraction: 92 %  End diastolic volume 45 ml  End systolic volume 4 ml  IMPRESSION: 1. Large area of moderate reversibility involving the lateral wall.  2. Normal left ventricular wall motion.  3. Left ventricular ejection fraction 92%  4. High-risk stress test findings*.  *2012 Appropriate Use Criteria for Coronary Revascularization Focused Update: J Am Coll Cardiol. 4034;74(2):595-638. http://content.[email or web address].aspx?articleid=1201161   Electronically Signed   By: Kerby Moors M.D.   On: 04/27/2014 16:22   Personally viewed.   Telemetry: no adverse rhythms Personally viewed.   EKG: NSSTW changes, precordial  Cardiac Studies:  NUC - high risk, lateral wall ischemia, EF normal. This is a change from normal perfusion on last NUC stress.   Scheduled Meds: . aspirin EC  325 mg Oral Daily  . atenolol  25 mg Oral Daily  . brimonidine  1 drop Both Eyes BID  . cholecalciferol  2,000 Units Oral Daily  . clopidogrel  75 mg Oral Daily  . enoxaparin (LOVENOX) injection  40 mg Subcutaneous Q24H  . latanoprost  1 drop Both Eyes QHS  . lisinopril  10 mg Oral Daily  . metroNIDAZOLE   Topical BID  . pantoprazole  40 mg Oral Daily  . regadenoson  0.4 mg Intravenous Once   Continuous Infusions:  PRN Meds:.acetaminophen, morphine injection, nitroGLYCERIN, ondansetron (ZOFRAN) IV   Assessment/Plan:  Principal Problem:   Chest pain Active Problems:   S/P  CABG x 3   CAD (coronary artery disease), autologous vein bypass graft   Hypertension   Hyperlipidemia LDL goal <18  80 year old with CAD post CABG with patent LIMA to LAD, occluded SVG to Diag, SVG -ramus, with high risk stress test, lateral wall ischemia. Normal EF.   -Heart cath today. She ate half of her breakfast around 8 AM. NPO now. Risks and benefits of stroke, MI , bleeding, renal impairment discussed. Proceed.   -On ASA 81 and Plavix, Bb, ACE-I  -Note occluded vein grafts x 2  -HTN - well controlled  -HL - statin intolerant   Yeraldy Spike 04/28/2014, 9:29 AM

## 2014-04-28 NOTE — Evaluation (Signed)
Physical Therapy Evaluation Patient Details Name: [name] MRN: [id number] DOB: Mar 27, 1932 Today's Date: 04/28/2014   History of Present Illness  80 y.o. female with history of CAD status post CABG, hypertension and hyperlipidemia presents to the ER because of chest pain  Clinical Impression  Patient independent with mobility, some noted balance deficits patient reports related to foot callus (pain). Educated patient re: sequencing and safety with mobility and pain management for foot.  Overall, mobilizing well, no further acute PT needs as this time. Will sign off.    Follow Up Recommendations No PT follow up    Equipment Recommendations  None recommended by PT    Recommendations for Other Services       Precautions / Restrictions        Mobility  Bed Mobility Overal bed mobility: Independent                Transfers Overall transfer level: Independent                  Ambulation/Gait Ambulation/Gait assistance: Independent Ambulation Distance (Feet): 210 Feet Assistive device: None Gait Pattern/deviations: Antalgic;Drifts right/left;Narrow base of support Gait velocity: wfl Gait velocity interpretation: at or above normal speed for age/gender General Gait Details: patient with some instability during ambulation secondary to foot calus  Stairs Stairs: Yes Stairs assistance: Modified independent (Device/Increase time) Stair Management: One rail Right Number of Stairs: 5 General stair comments: VCs for sequencing with foot callus  Wheelchair Mobility    Modified Rankin (Stroke Patients Only)       Balance                                             Pertinent Vitals/Pain Pain Assessment: 0-10 Pain Score: 4  Pain Location: foot callus during ambulation without shoe    Home Living Family/patient expects to be discharged to:: Private residence Living Arrangements: Other relatives   Type of Home: House Home Access:  Stairs to enter Entrance Stairs-Rails: Can reach both Entrance Stairs-Number of Steps: 3 Home Layout: Two level;Able to live on main level with bedroom/bathroom Home Equipment: None      Prior Function Level of Independence: Independent               Hand Dominance   Dominant Hand: Right    Extremity/Trunk Assessment   Upper Extremity Assessment: Overall WFL for tasks assessed           Lower Extremity Assessment: Overall WFL for tasks assessed         Communication   Communication: No difficulties  Cognition Arousal/Alertness: Awake/alert Behavior During Therapy: WFL for tasks assessed/performed Overall Cognitive Status: Within Functional Limits for tasks assessed                      General Comments      Exercises        Assessment/Plan    PT Assessment Patent does not need any further PT services  PT Diagnosis Difficulty walking   PT Problem List    PT Treatment Interventions     PT Goals (Current goals can be found in the Care Plan section) Acute Rehab PT Goals PT Goal Formulation: All assessment and education complete, DC therapy    Frequency     Barriers to discharge        Co-evaluation  End of Session Equipment Utilized During Treatment: Gait belt Activity Tolerance: Patient tolerated treatment well Patient left: in bed;with call bell/phone within reach (sitting EOB) Nurse Communication: Mobility status         Time: 5643-3295 PT Time Calculation (min) (ACUTE ONLY): 14 min   Charges:   PT Evaluation $Initial PT Evaluation Tier I: 1 Procedure PT Treatments $Gait Training: 8-22 mins   PT G CodesDuncan Dull 04/30/2014, 10:18 AM Alben Deeds, PT DPT  [phone]

## 2014-04-28 NOTE — CV Procedure (Signed)
     Left Heart Catheterization with Coronary and Bypass Graft Angiography Report  [name]  80 y.o.  female 09-26-1931  Procedure Date: 04/28/2014 Referring Physician: Glenetta Hew, M.D.  Primary Cardiologist: Glenetta Hew, M.D.   INDICATION: Prolonged chest pain, recurrent, with high risk myocardial perfusion study showing a large area of lateral ischemia. 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  PROCEDURE: 1. Left heart catheterization; 2. Coronary angiography; 3. Left ventriculography; 4. Bypass graft angiography; 5. Internal mammary graft angiography.  CONSENT: The risks, benefits, and details of the procedure were explained in detail to the patient. Risks including death, stroke, heart attack, kidney injury, allergy, limb ischemia, bleeding and radiation injury were discussed.  The patient verbalized understanding and wanted to proceed.  Informed written consent was obtained.  PROCEDURE TECHNIQUE:  After Xylocaine anesthesia a 5 French  sheath was placed in the rfemoralrtery  using  the modified Seldinger technique.  Coronary angiography was done usingA2 MP, JR4, and IMA catheter.  Left ventriculography was done using the JR 4  catheter and hand injection.  The images were reviewed post-procedure and the case was terminated. Hemostasis was achieved with manual compression.    CONTRAST:  Total of 469 cc   COMPLICATIONS:  none   HEMODYNAMICS:  Aortic pressure 105/60 mmHg; LV pressure 105/2 mmHg; LVEDP 10 mmHg  ANGIOGRAPHIC DATA:   The left main coronary artery is widely patent.  The left anterior descending artery is patent and there is competition with the LIMA for flow into the distal vessel there is segmental 60-70% stenosis proximal and distal to the origin of the first of perforator. No high-grade obstruction is noted in the LAD.  The lleft circumflex artery is widely patent  and normal with 3 small obtuse marginal branches arising distally.  The ramus intermedius branch is widely patent one view suggests a 60-70% stenosis in a branch of the ramus intermedius. The ramus is difficult to lay out due to tortuosity..  The right coronary artery is dominant with a large distribution PDA and 2 left ventricular branches that are widely patent. Irregularities noted in the proximal mid and distal RCA.[name]  BYPASS GRAFT ANGIOGRAPHY: The saphenous vein graft to the diagonal is totally occluded   The saphenous vein graft to the ramus intermedius is totally occluded  The left internal mammary graft to the LAD is widely patent.  LEFT VENTRICULOGRAM:  Left ventricular angiogram was done in the 30 RAO projection and revealed  a normal sized left ventricular cavity with EF of 70%.  IMPRESSION:  1. 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  2. Widely patent  Native circumflex, LAD, and RCA.  3. Widely patent ramus intermedius with perhaps a 70% stenosis in a branch of the main vessel.  4. Normal left ventricular systolic function  5. When compared to the angiography performed in 2008, no changes occurred.   RECOMMENDATION:  Continue risk factor modification. No further evaluation or treatment is necessary. It is doubtful that the chest discomfort was ischemic in origin. The "high risk perfusion study" is not explained by the current angiography.[name]

## 2014-04-28 NOTE — Interval H&P Note (Signed)
Cath Lab Visit (complete for each Cath Lab visit)  Clinical Evaluation Leading to the Procedure:   ACS: Yes.    Non-ACS:    Anginal Classification: CCS III  Anti-ischemic medical therapy: Minimal Therapy (1 class of medications)  Non-Invasive Test Results: High-risk stress test findings: cardiac mortality >3%/year  Prior CABG: Previous CABG      History and Physical Interval Note:  04/28/2014 5:16 PM  [name]  has presented today for surgery, with the diagnosis of cp  The various methods of treatment have been discussed with the patient and family. After consideration of risks, benefits and other options for treatment, the patient has consented to  Procedure(s): LEFT HEART CATHETERIZATION WITH CORONARY/GRAFT ANGIOGRAM (N/A) as a surgical intervention .  The patient's history has been reviewed, patient examined, no change in status, stable for surgery.  I have reviewed the patient's chart and labs.  Questions were answered to the patient's satisfaction.     Sinclair Grooms

## 2014-04-28 NOTE — Progress Notes (Signed)
Patient Demographics  [name], is a 80 y.o. female, DOB - [birth year], ZOX:[id number]  Admit date - 04/26/2014   Admitting Physician Rise Patience, MD  Outpatient Primary MD for the patient is [name], MD  LOS - 2   Chief Complaint  Patient presents with  . Chest Pain        Subjective:   [name] today has, No headache, No chest pain, No abdominal pain,  +ve Heart burn and  Nausea, +ve diarrhea, No new weakness tingling or numbness, No Cough - SOB.    Assessment & Plan    1. Atypical  Chest pain - history of CAD and CABG, ruled out MI, chest pain free, continue aspirin, Plavix and if needed beta blocker as instructed by cardiology, stress test shows large reversible defect, due for left heart cath later on 04/28/2014.   2. Heartburn and diarrhea. Likely some gastroenteritis and GERD, placed on GI cocktail and PPI, diarrhea resolved symptoms resolved.   3. Essential hypertension. Beta blocker once cleared by cardiology. Continue ACE, Stable.   4. Dyslipidemia. Outpatient follow-up with PCP. Resume [place] upon DC.     Code Status: Full  Family Communication: none  Disposition Plan: Home   Procedures   Left heart cath pending   TTE - Left ventricle: The cavity size was normal. Wall thickness wasnormal. Systolic function was normal. The estimated ejectionfraction was in the range of 50% to 55%. Wall motion was normal;there were no regional wall motion abnormalities. Dopplerparameters are consistent with abnormal left ventricularrelaxation (grade 1 diastolic dysfunction). - Left atrium: The atrium was mildly dilated.    Myoview  1. Large area of moderate reversibility involving the lateral wall.  2. Normal left ventricular wall  motion.  3. Left ventricular ejection fraction 92%  4. High-risk stress test findings*.   Consults  Cards   Medications  Scheduled Meds: . aspirin EC  325 mg Oral Daily  . atenolol  25 mg Oral Daily  . brimonidine  1 drop Both Eyes BID  . cholecalciferol  2,000 Units Oral Daily  . clopidogrel  75 mg Oral Daily  . enoxaparin (LOVENOX) injection  40 mg Subcutaneous Q24H  . latanoprost  1 drop Both Eyes QHS  . lisinopril  10 mg Oral Daily  . metroNIDAZOLE   Topical BID  . pantoprazole  40 mg Oral Daily  . regadenoson  0.4 mg Intravenous Once   Continuous Infusions:   PRN Meds:.acetaminophen, morphine injection, nitroGLYCERIN, ondansetron (ZOFRAN) IV  DVT Prophylaxis  Lovenox    Lab Results  Component Value Date   PLT 299 04/27/2014    Antibiotics     Anti-infectives    None          Objective:   Filed Vitals:   04/27/14 1436 04/27/14 1438 04/27/14 2144 04/28/14 0523  BP: 113/53 113/52 110/42 113/55  Pulse: 91 92 76 71  Temp:   98.2 F (36.8 C) 97.3 F (36.3 C)  TempSrc:   Oral Oral  Resp:   18 18  Height:      Weight:    62.9 kg (138 lb 10.7 oz)  SpO2:   95% 95%    Wt Readings from Last 3 Encounters:  04/28/14 62.9 kg (  138 lb 10.7 oz)  05/16/13 65.318 kg (144 lb)  05/12/13 65.409 kg (144 lb 3.2 oz)     Intake/Output Summary (Last 24 hours) at 04/28/14 1040 Last data filed at 04/28/14 0914  Gross per 24 hour  Intake    480 ml  Output    225 ml  Net    255 ml     Physical Exam  Awake Alert, Oriented X 3, No new F.N deficits, Normal affect [place].AT,PERRAL Supple Neck,No JVD, No cervical lymphadenopathy appriciated.  Symmetrical Chest wall movement, Good air movement bilaterally, CTAB RRR,No Gallops,Rubs or new Murmurs, No Parasternal Heave +ve B.Sounds, Abd Soft, No tenderness, No organomegaly appriciated, No rebound - guarding or rigidity. No Cyanosis, Clubbing or edema, No new Rash or bruise      Data Review   Micro Results No  results found for this or any previous visit (from the past 240 hour(s)).  Radiology Reports Dg Chest 2 View  04/26/2014   CLINICAL DATA:  Chest pain for 4 hours.  History of prior CABG.  EXAM: CHEST  2 VIEW  COMPARISON:  PA and lateral chest 08/20/2010.  FINDINGS: The patient is status post CABG. Heart size is normal. Lungs are clear. No pneumothorax or pleural effusion.  IMPRESSION: No acute disease.   Electronically Signed   By: Inge Rise M.D.   On: 04/26/2014 19:49   Ct Angio Chest Pe W/cm &/or Wo Cm  04/26/2014   CLINICAL DATA:  Chest pain, upper abdominal pain beginning this afternoon while at rest. Intermittent since thin. Prior CABG.  EXAM: CT ANGIOGRAPHY CHEST WITH CONTRAST  TECHNIQUE: Multidetector CT imaging of the chest was performed using the standard protocol during bolus administration of intravenous contrast. Multiplanar CT image reconstructions and MIPs were obtained to evaluate the vascular anatomy.  CONTRAST:  50mL OMNIPAQUE IOHEXOL 350 MG/ML SOLN  COMPARISON:  Chest x-ray 04/26/2014  FINDINGS: No filling defects in the pulmonary arteries to suggest pulmonary emboli. Minimal density in the inferior lingula, likely atelectasis. Lungs are otherwise clear. No pleural effusions.  Prior CABG. Heart is normal size. Aorta is normal caliber. No mediastinal, hilar, or axillary adenopathy. Chest wall soft tissues are unremarkable. Imaging into the upper abdomen shows no acute findings. Layering gallstones noted within the gallbladder.  Review of the MIP images confirms the above findings.  IMPRESSION: No evidence of pulmonary embolus.  Prior CABG.  Cholelithiasis.   Electronically Signed   By: Rolm Baptise M.D.   On: 04/26/2014 23:24   Nm Myocar Multi W/spect W/wall Motion / Ef  04/27/2014   CLINICAL DATA:  Hypertension, coronary artery disease, CABG and dyslipidemia. Now with chest pain of uncertain etiology.  EXAM: MYOCARDIAL IMAGING WITH SPECT (REST AND PHARMACOLOGIC-STRESS)  GATED  LEFT VENTRICULAR WALL MOTION STUDY  LEFT VENTRICULAR EJECTION FRACTION  TECHNIQUE: Standard myocardial SPECT imaging was performed after resting intravenous injection of 10 mCi Tc-57m sestamibi. Subsequently, intravenous infusion of Lexiscan was performed under the supervision of the Cardiology staff. At peak effect of the drug, 30 mCi Tc-40m sestamibi was injected intravenously and standard myocardial SPECT imaging was performed. Quantitative gated imaging was also performed to evaluate left ventricular wall motion, and estimate left ventricular ejection fraction.  COMPARISON:  None.  FINDINGS: Perfusion: There is a large reversible defect involving the anterior and inferior lateral wall. No fixed defects identified.  Wall Motion: Normal left ventricular wall motion. No left ventricular dilation.  Left Ventricular Ejection Fraction: 92 %  End diastolic volume 45 ml  End systolic volume 4 ml  IMPRESSION: 1. Large area of moderate reversibility involving the lateral wall.  2. Normal left ventricular wall motion.  3. Left ventricular ejection fraction 92%  4. High-risk stress test findings*.  *2012 Appropriate Use Criteria for Coronary Revascularization Focused Update: J Am Coll Cardiol. 9485;46(2):703-500. http://content.[email or web address].aspx?articleid=1201161   Electronically Signed   By: Kerby Moors M.D.   On: 04/27/2014 16:22     CBC  Recent Labs Lab 04/26/14 1902 04/27/14 0648  WBC 8.1 8.2  HGB 13.5 13.5  HCT 40.9 39.6  PLT 286 299  MCV 92.1 93.2  MCH 30.4 31.8  MCHC 33.0 34.1  RDW 13.0 13.2  LYMPHSABS  --  1.2  MONOABS  --  0.6  EOSABS  --  0.0  BASOSABS  --  0.0    Chemistries   Recent Labs Lab 04/26/14 1902 04/27/14 0648  NA 140 145  K 3.9 3.8  CL 107 110  CO2 26 22  GLUCOSE 126* 127*  BUN 18 15  CREATININE 0.88 0.78  CALCIUM 10.1 9.7  AST  --  21  ALT  --  12  ALKPHOS  --  67  BILITOT  --  0.6    ------------------------------------------------------------------------------------------------------------------ estimated creatinine clearance is 52.5 mL/min (by C-G formula based on Cr of 0.78). ------------------------------------------------------------------------------------------------------------------ No results for input(s): HGBA1C in the last 72 hours. ------------------------------------------------------------------------------------------------------------------ No results for input(s): CHOL, HDL, LDLCALC, TRIG, CHOLHDL, LDLDIRECT in the last 72 hours. ------------------------------------------------------------------------------------------------------------------ No results for input(s): TSH, T4TOTAL, T3FREE, THYROIDAB in the last 72 hours.  Invalid input(s): FREET3 ------------------------------------------------------------------------------------------------------------------ No results for input(s): VITAMINB12, FOLATE, FERRITIN, TIBC, IRON, RETICCTPCT in the last 72 hours.  Coagulation profile No results for input(s): INR, PROTIME in the last 168 hours.   Cardiac Enzymes  Recent Labs Lab 04/27/14 0230 04/27/14 0648 04/27/14 1610  TROPONINI <0.03 <0.03 <0.03   ------------------------------------------------------------------------------------------------------------------ Invalid input(s): POCBNP     Time Spent in minutes   35   SINGH,PRASHANT K M.D on 04/28/2014 at 10:40 AM  Between 7am to 7pm - Pager - [phone]  After 7pm go to [email or web address] - [facility] Office  [phone]

## 2014-04-28 NOTE — Progress Notes (Signed)
UR completed 

## 2014-04-29 ENCOUNTER — Telehealth: Payer: Self-pay | Admitting: Cardiology

## 2014-04-29 ENCOUNTER — Encounter (HOSPITAL_COMMUNITY): Payer: Self-pay | Admitting: Interventional Cardiology

## 2014-04-29 DIAGNOSIS — Z951 Presence of aortocoronary bypass graft: Secondary | ICD-10-CM | POA: Diagnosis not present

## 2014-04-29 DIAGNOSIS — R079 Chest pain, unspecified: Secondary | ICD-10-CM | POA: Insufficient documentation

## 2014-04-29 DIAGNOSIS — R0789 Other chest pain: Secondary | ICD-10-CM | POA: Diagnosis not present

## 2014-04-29 DIAGNOSIS — I10 Essential (primary) hypertension: Secondary | ICD-10-CM | POA: Diagnosis not present

## 2014-04-29 MED ORDER — PANTOPRAZOLE SODIUM 40 MG PO TBEC: 40.0000 mg | DELAYED_RELEASE_TABLET | Freq: Every day | ORAL | Status: DC

## 2014-04-29 NOTE — Progress Notes (Signed)
DC IV, DC Tele, DC Home. Discharge instructions and home medications discussed with patient and patient's family member. Patient leaving unit via wheelchair and appears in no acute distress.

## 2014-04-29 NOTE — Telephone Encounter (Signed)
Closed Encounter  °

## 2014-04-29 NOTE — Progress Notes (Signed)
Subjective:   On this occasion the patient is admitted with recurring substernal chest discomfort. The discomfort is similar in location to complaints that were present prior to coronary bypass grafting in 2005. There is no exertional component. Episodes of discomfort have lasted up to 10 minutes. She has had no recurrence of discomfort since admission to the hospital No further CP. NUC stress abnormal (lateral ischemia) and determined high risk. Cath no change from prior.   Feels well. Cath site normal.   Objective:  Vital Signs in the last 24 hours: Temp:  [97.6 F (36.4 C)-98 F (36.7 C)] 97.7 F (36.5 C) (12/30 0628) Pulse Rate:  [58-72] 63 (12/30 0628) Resp:  [13-18] 18 (12/30 0628) BP: (93-143)/(38-73) 128/49 mmHg (12/30 0628) SpO2:  [94 %-99 %] 97 % (12/30 0628) Weight:  [137 lb 5.6 oz (62.3 kg)-138 lb 0.1 oz (62.6 kg)] 138 lb 0.1 oz (62.6 kg) (12/30 0628)  Intake/Output from previous day: 12/29 0701 - 12/30 0700 In: 1252.5 [P.O.:720; I.V.:532.5] Out: 900 [Urine:900]   Physical Exam: General: Well developed, well nourished, in no acute distress, mild pale appearance (Hg 13.5) Head:  Normocephalic and atraumatic. Lungs: Clear to auscultation and percussion. Heart: Normal S1 and S2.  Soft systolic murmur, no rubs or gallops.  Abdomen: soft, non-tender, positive bowel sounds. Extremities: No clubbing or cyanosis. No edema. Neurologic: Alert and oriented x 3.    Lab Results:  Recent Labs  04/26/14 1902 04/27/14 0648  WBC 8.1 8.2  HGB 13.5 13.5  PLT 286 299    Recent Labs  04/26/14 1902 04/27/14 0648  NA 140 145  K 3.9 3.8  CL 107 110  CO2 26 22  GLUCOSE 126* 127*  BUN 18 15  CREATININE 0.88 0.78    Recent Labs  04/27/14 0648 04/27/14 1610  TROPONINI <0.03 <0.03   Hepatic Function Panel  Recent Labs  04/27/14 0648  PROT 6.3  ALBUMIN 3.7  AST 21  ALT 12  ALKPHOS 33  BILITOT 0.6   Imaging: Nm Myocar Multi W/spect W/wall Motion /  Ef  04/27/2014   CLINICAL DATA:  Hypertension, coronary artery disease, CABG and dyslipidemia. Now with chest pain of uncertain etiology.  EXAM: MYOCARDIAL IMAGING WITH SPECT (REST AND PHARMACOLOGIC-STRESS)  GATED LEFT VENTRICULAR WALL MOTION STUDY  LEFT VENTRICULAR EJECTION FRACTION  TECHNIQUE: Standard myocardial SPECT imaging was performed after resting intravenous injection of 10 mCi Tc-59m sestamibi. Subsequently, intravenous infusion of Lexiscan was performed under the supervision of the Cardiology staff. At peak effect of the drug, 30 mCi Tc-77m sestamibi was injected intravenously and standard myocardial SPECT imaging was performed. Quantitative gated imaging was also performed to evaluate left ventricular wall motion, and estimate left ventricular ejection fraction.  COMPARISON:  None.  FINDINGS: Perfusion: There is a large reversible defect involving the anterior and inferior lateral wall. No fixed defects identified.  Wall Motion: Normal left ventricular wall motion. No left ventricular dilation.  Left Ventricular Ejection Fraction: 92 %  End diastolic volume 45 ml  End systolic volume 4 ml  IMPRESSION: 1. Large area of moderate reversibility involving the lateral wall.  2. Normal left ventricular wall motion.  3. Left ventricular ejection fraction 92%  4. High-risk stress test findings*.  *2012 Appropriate Use Criteria for Coronary Revascularization Focused Update: J Am Coll Cardiol. 0960;45(4):098-119. http://content.[email or web address].aspx?articleid=1201161   Electronically Signed   By: Kerby Moors M.D.   On: 04/27/2014 16:22   Personally viewed.   Telemetry: no adverse rhythms Personally viewed.  EKG: NSSTW changes, precordial  Cardiac Studies:  NUC - high risk, lateral wall ischemia, EF normal. This is a change from normal perfusion on last NUC stress.   Scheduled Meds: . atenolol  25 mg Oral Daily  . cholecalciferol  2,000 Units Oral Daily  . clopidogrel  75 mg Oral Daily  .  enoxaparin (LOVENOX) injection  40 mg Subcutaneous Q24H  . latanoprost  1 drop Both Eyes QHS  . lisinopril  10 mg Oral Daily  . metroNIDAZOLE   Topical BID  . pantoprazole  40 mg Oral Daily   Continuous Infusions:  PRN Meds:.acetaminophen, nitroGLYCERIN, ondansetron (ZOFRAN) IV, oxyCODONE-acetaminophen   Assessment/Plan:  Principal Problem:   Chest pain Active Problems:   S/P CABG x 3   CAD (coronary artery disease), autologous vein bypass graft   Hypertension   Hyperlipidemia LDL goal <63  80 year old with CAD post CABG with patent LIMA to LAD, occluded SVG to Diag, SVG -ramus, with high risk stress test, lateral wall ischemia. Normal EF.   -Heart cath was reassuring with no new findings other than occluded SVG to Ramus and Diag. Patent circ LAD RCA. Perhaps 70% stenosis in branch of main ramus vessel (?responsible for NUC finding however this was present in 2008 and NUC prior to this one showed no perfusion defect).   -On ASA 81 and Plavix, Bb, ACE-I  -HTN - well controlled  -HL - statin intolerant  OK for DC with continued medical mgt.  Keep appt with Dr. Ellyn Hack.   Mykell Genao, [place] 04/29/2014, 8:29 AM

## 2014-04-29 NOTE — Discharge Summary (Signed)
[name], is a 80 y.o. female  DOB 05-Apr-1932  MRN [id number].  Admission date:  04/26/2014  Admitting Physician  Rise Patience, MD  Discharge Date:  04/29/2014   Primary MD  [name], MD  Recommendations for primary care physician for things to follow:   Monitor secondary to his factors for CAD. Must follow with GI and cardiology one time post discharge within 1-2 weeks.   Admission Diagnosis  Other chest pain [R07.89] Chest pain [R07.9]   Discharge Diagnosis  Other chest pain [R07.89] Chest pain [R07.9]     Principal Problem:   Chest pain Active Problems:   S/P CABG x 3   CAD (coronary artery disease), autologous vein bypass graft   Hypertension   Hyperlipidemia LDL goal <70      Past Medical History  Diagnosis Date  . Dyslipidemia     Statin intolerant  . Hypertension   . CAD in native artery   . Non-STEMI (non-ST elevated myocardial infarction) October 2005    EF by 35-40%, echo 40-50%. Angiography: 99% mid LAD involving D1 followed by 70% mid LAD; 80% RI. --> CABG  . 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  [name] CAD (coronary artery disease), autologous vein bypass graft March 2008    Occluded SVG ID 1 and occluded SVG-RI.  [name] Statin intolerance     Past Surgical History  Procedure Laterality Date  . Transthoracic echocardiogram  02/19/2004    EF 45-50%, Normal LV function, moderate hypokinesis of anterior wall.  . Cardiac catheterization  02/19/2004    Significant 2 vessel CAD - LAD, D1 and RI  . Coronary artery bypass graft  02/22/2004    x3. LIMA to distal LAD, SVG to first diag, SVG to ramus. SVG harvest from rt thigh.  . Cardiovascular stress test  05/23/2006    Mild lateral/inferolateral ischemia, likely due to occluded SVGs with exhisting disease.  . Cardiac  catheterization  07/25/2006    Totally occluded SVG to diag and ramus. Patent LIMA-LAD. Ramus 70-80% proximal stenosis and occluded vein graft.  . Carotid doppler  07/15/2009    Bilat ICAs 0-49% diameter reduction. Normal patency of Bilat subclavian arteries.  . Nm myoview ltd  January 2008    Referred for For mild inferolateral and anterolateral/apical lateral defect with mild reversibility.  . Left heart catheterization with coronary/graft angiogram N/A 04/28/2014    Procedure: LEFT HEART CATHETERIZATION WITH Beatrix Fetters;  Surgeon: Sinclair Grooms, MD;  Location: [facility] CATH LAB;  Service: Cardiovascular;  Laterality: N/A;       History of present illness and  Hospital Course:     Kindly see H&P for history of present illness and admission details, please review complete Labs, Consult reports and Test reports for all details in brief  HPI  from the history and physical done on the day of admission  [name] is a 80 y.o. female with history of CAD status post CABG, hypertension and hyperlipidemia presents  to the ER because of chest pain. Patient states her chest pain started last evening while at rest. Pain was retrosternal pressure-like nonradiating and also patient had mild abdominal discomfort at same time. Denies any nausea vomiting diarrhea shortness of breath diaphoresis. Pain lasted for a few minutes but recurred. By the time patient ER patient was chest pain-free. EKG and cardiac markers were unremarkable. The patient's d-dimer was elevated CT angiogram of the chest was done which was negative for PE. Patient has been admitted for further workup for her chest pain given her history of CAD. Patient did have a stress test done in January this year which was low risk for ischemia.  Hospital Course     1. Atypical chest pain. Had history of CAD and CABG, ruled out MI, had a positive stress test prompting a left heart cath which showed old blockages which were consistent  with previous cath findings, her pain was likely GI in origin. Was seen by cardiology and advised to be discharged on her home medications with outpatient cardiology follow-up.   2. Heartburn with diarrhea. Likely cause of her chest discomfort, she likely had gastroenteritis along with GERD. Diarrhea has since resolved. She is completely symptom free after one dose of GI cocktail and scheduled PPI. Will be placed on PPI. Request one time outpatient GI follow-up.   3. Essential hypertension and dyslipidemia. Continue home medications.     Discharge Condition: Stable   Follow UP  Follow-up Information    Follow up with [name], MD. Schedule an appointment as soon as possible for a visit in 1 week.   Specialty:  Family Medicine   Why:  Follow with  your GI MD within a week   Contact information:   [place], [place] [address] [phone]       Follow up with Leonie Man, MD. Schedule an appointment as soon as possible for a visit in 1 week.   Specialty:  Cardiology   Contact information:   [address] [place] [address] [phone]         Discharge Instructions  and  Discharge Medications      Discharge Instructions    Diet - low sodium heart healthy    Complete by:  As directed      Discharge instructions    Complete by:  As directed   Follow with Primary MD [name], MD in 7 days   Get CBC, CMP, 2 view Chest X ray checked  by Primary MD next visit.    Activity: As tolerated with Full fall precautions use walker/cane & assistance as needed   Disposition Home     Diet: Heart Healthy   For Heart failure patients - Check your Weight same time everyday, if you gain over 2 pounds, or you develop in leg swelling, experience more shortness of breath or chest pain, call your Primary MD immediately. Follow Cardiac Low Salt Diet and 1.8 lit/day fluid restriction.   On your next visit with your  primary care physician please Get Medicines reviewed and adjusted.   Please request your Prim.MD to go over all Hospital Tests and Procedure/Radiological results at the follow up, please get all Hospital records sent to your Prim MD by signing hospital release before you go home.   If you experience worsening of your admission symptoms, develop shortness of breath, life threatening emergency, suicidal or homicidal thoughts you must seek medical attention immediately by calling 911 or  calling your MD immediately  if symptoms less severe.  You Must read complete instructions/literature along with all the possible adverse reactions/side effects for all the Medicines you take and that have been prescribed to you. Take any new Medicines after you have completely understood and accpet all the possible adverse reactions/side effects.   Do not drive, operating heavy machinery, perform activities at heights, swimming or participation in water activities or provide baby sitting services if your were admitted for syncope or siezures until you have seen by Primary MD or a Neurologist and advised to do so again.  Do not drive when taking Pain medications.    Do not take more than prescribed Pain, Sleep and Anxiety Medications  Special Instructions: If you have smoked or chewed Tobacco  in the last 2 yrs please stop smoking, stop any regular Alcohol  and or any Recreational drug use.  Wear Seat belts while driving.   Please note  You were cared for by a hospitalist during your hospital stay. If you have any questions about your discharge medications or the care you received while you were in the hospital after you are discharged, you can call the unit and asked to speak with the hospitalist on call if the hospitalist that took care of you is not available. Once you are discharged, your primary care physician will handle any further medical issues. Please note that NO REFILLS for any discharge medications  will be authorized once you are discharged, as it is imperative that you return to your primary care physician (or establish a relationship with a primary care physician if you do not have one) for your aftercare needs so that they can reassess your need for medications and monitor your lab values.     Increase activity slowly    Complete by:  As directed             Medication List    STOP taking these medications        famotidine 10 MG chewable tablet  Commonly known as:  PEPCID AC      TAKE these medications        aspirin EC 81 MG tablet  Take 81 mg by mouth daily.     atenolol 25 MG tablet  Commonly known as:  TENORMIN  Take 1 tablet (25 mg total) by mouth daily.     bimatoprost 0.03 % ophthalmic solution  Commonly known as:  LUMIGAN  Place 1 drop into both eyes at bedtime.     brimonidine 0.2 % ophthalmic solution  Commonly known as:  ALPHAGAN  Place 1 drop into both eyes 2 (two) times daily.     clopidogrel 75 MG tablet  Commonly known as:  PLAVIX  TAKE 1 TABLET BY MOUTH EVERY DAY     FLUZONE HIGH-DOSE 0.5 ML Susy  Generic drug:  Influenza Vac Split High-Dose  Inject 0.5 mLs as directed once.     lisinopril 10 MG tablet  Commonly known as:  PRINIVIL,ZESTRIL  TAKE 1 TABLET BY MOUTH EVERY DAY     metroNIDAZOLE 0.75 % cream  Commonly known as:  METROCREAM  Apply 1 application topically 2 (two) times daily.     pantoprazole 40 MG tablet  Commonly known as:  PROTONIX  Take 1 tablet (40 mg total) by mouth daily.     Pitavastatin Calcium 1 MG Tabs  Take 1/2 tablet once a week for month ,then1/2 tablet twice a week     Pitavastatin Calcium 2  MG Tabs  Commonly known as:  LIVALO  Take 0.5 tablets (1 mg total) by mouth 2 (two) times a week.     triamcinolone cream 0.1 %  Commonly known as:  KENALOG  Apply 1 application topically as needed.     Vitamin D3 2000 UNITS capsule  Take 2,000 Units by mouth daily.          Diet and Activity recommendation:  See Discharge Instructions above   Consults obtained - Cards   Major procedures and Radiology Reports - PLEASE review detailed and final reports for all details, in brief -     L Heart Cath Dr Tamala Julian 04-28-14   LEFT VENTRICULOGRAM: Left ventricular angiogram was done in the 30 RAO projection and revealed a normal sized left ventricular cavity with EF of 70%.  IMPRESSION: 1. 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  2. Widely patent Native circumflex, LAD, and RCA.  3. Widely patent ramus intermedius with perhaps a 70% stenosis in a branch of the main vessel.  4. Normal left ventricular systolic function  5. When compared to the angiography performed in 2008, no changes occurred.   RECOMMENDATION: Continue risk factor modification. No further evaluation or treatment is necessary. It is doubtful that the chest discomfort was ischemic in origin. The "high risk perfusion study" is not explained by the current angiography..    Dg Chest 2 View  04/26/2014   CLINICAL DATA:  Chest pain for 4 hours.  History of prior CABG.  EXAM: CHEST  2 VIEW  COMPARISON:  PA and lateral chest 08/20/2010.  FINDINGS: The patient is status post CABG. Heart size is normal. Lungs are clear. No pneumothorax or pleural effusion.  IMPRESSION: No acute disease.   Electronically Signed   By: Inge Rise M.D.   On: 04/26/2014 19:49   Ct Angio Chest Pe W/cm &/or Wo Cm  04/26/2014   CLINICAL DATA:  Chest pain, upper abdominal pain beginning this afternoon while at rest. Intermittent since thin. Prior CABG.  EXAM: CT ANGIOGRAPHY CHEST WITH CONTRAST  TECHNIQUE: Multidetector CT imaging of the chest was performed using the standard protocol during bolus administration of intravenous contrast. Multiplanar CT image reconstructions and MIPs were obtained to evaluate the vascular anatomy.  CONTRAST:  49mL OMNIPAQUE IOHEXOL 350 MG/ML SOLN   COMPARISON:  Chest x-ray 04/26/2014  FINDINGS: No filling defects in the pulmonary arteries to suggest pulmonary emboli. Minimal density in the inferior lingula, likely atelectasis. Lungs are otherwise clear. No pleural effusions.  Prior CABG. Heart is normal size. Aorta is normal caliber. No mediastinal, hilar, or axillary adenopathy. Chest wall soft tissues are unremarkable. Imaging into the upper abdomen shows no acute findings. Layering gallstones noted within the gallbladder.  Review of the MIP images confirms the above findings.  IMPRESSION: No evidence of pulmonary embolus.  Prior CABG.  Cholelithiasis.   Electronically Signed   By: Rolm Baptise M.D.   On: 04/26/2014 23:24   Nm Myocar Multi W/spect W/wall Motion / Ef  04/27/2014   CLINICAL DATA:  Hypertension, coronary artery disease, CABG and dyslipidemia. Now with chest pain of uncertain etiology.  EXAM: MYOCARDIAL IMAGING WITH SPECT (REST AND PHARMACOLOGIC-STRESS)  GATED LEFT VENTRICULAR WALL MOTION STUDY  LEFT VENTRICULAR EJECTION FRACTION  TECHNIQUE: Standard myocardial SPECT imaging was performed after resting intravenous injection of 10 mCi Tc-53m sestamibi. Subsequently, intravenous infusion of Lexiscan was performed under the supervision of the Cardiology  staff. At peak effect of the drug, 30 mCi Tc-2m sestamibi was injected intravenously and standard myocardial SPECT imaging was performed. Quantitative gated imaging was also performed to evaluate left ventricular wall motion, and estimate left ventricular ejection fraction.  COMPARISON:  None.  FINDINGS: Perfusion: There is a large reversible defect involving the anterior and inferior lateral wall. No fixed defects identified.  Wall Motion: Normal left ventricular wall motion. No left ventricular dilation.  Left Ventricular Ejection Fraction: 92 %  End diastolic volume 45 ml  End systolic volume 4 ml  IMPRESSION: 1. Large area of moderate reversibility involving the lateral wall.  2. Normal  left ventricular wall motion.  3. Left ventricular ejection fraction 92%  4. High-risk stress test findings*.  *2012 Appropriate Use Criteria for Coronary Revascularization Focused Update: J Am Coll Cardiol. 9211;94(1):740-814. http://content.airportbarriers.com.aspx?articleid=1201161   Electronically Signed   By: Kerby Moors M.D.   On: 04/27/2014 16:22    Micro Results      No results found for this or any previous visit (from the past 240 hour(s)).     Today   Subjective:   Talley Casco today has no headache,no chest abdominal pain,no new weakness tingling or numbness, feels much better wants to go home today.    Objective:   Blood pressure 128/49, pulse 63, temperature 97.7 F (36.5 C), temperature source Oral, resp. rate 18, height 5\' 6"  (1.676 m), weight 62.6 kg (138 lb 0.1 oz), SpO2 97 %.   Intake/Output Summary (Last 24 hours) at 04/29/14 1018 Last data filed at 04/29/14 4818  Gross per 24 hour  Intake 1132.5 ml  Output   1000 ml  Net  132.5 ml    Exam Awake Alert, Oriented x 3, No new F.N deficits, Normal affect [place].AT,PERRAL Supple Neck,No JVD, No cervical lymphadenopathy appriciated.  Symmetrical Chest wall movement, Good air movement bilaterally, CTAB RRR,No Gallops,Rubs or new Murmurs, No Parasternal Heave +ve B.Sounds, Abd Soft, Non tender, No organomegaly appriciated, No rebound -guarding or rigidity. No Cyanosis, Clubbing or edema, No new Rash or bruise  Data Review   CBC w Diff: Lab Results  Component Value Date   WBC 8.2 04/27/2014   HGB 13.5 04/27/2014   HCT 39.6 04/27/2014   PLT 299 04/27/2014   LYMPHOPCT 15 04/27/2014   MONOPCT 7 04/27/2014   EOSPCT 0 04/27/2014   BASOPCT 0 04/27/2014    CMP: Lab Results  Component Value Date   NA 145 04/27/2014   K 3.8 04/27/2014   CL 110 04/27/2014   CO2 22 04/27/2014   BUN 15 04/27/2014   CREATININE 0.78 04/27/2014   PROT 6.3 04/27/2014   ALBUMIN 3.7 04/27/2014   BILITOT 0.6 04/27/2014    ALKPHOS 67 04/27/2014   AST 21 04/27/2014   ALT 12 04/27/2014  .   Total Time in preparing paper work, data evaluation and todays exam - 35 minutes  Thurnell Lose M.D on 04/29/2014 at 10:18 AM  [facility] Office  [phone]

## 2014-04-29 NOTE — Discharge Instructions (Signed)
Follow with Primary MD [name], MD in 7 days   Get CBC, CMP, 2 view Chest X ray checked  by Primary MD next visit.    Activity: As tolerated with Full fall precautions use walker/cane & assistance as needed   Disposition Home     Diet: Heart Healthy   For Heart failure patients - Check your Weight same time everyday, if you gain over 2 pounds, or you develop in leg swelling, experience more shortness of breath or chest pain, call your Primary MD immediately. Follow Cardiac Low Salt Diet and 1.8 lit/day fluid restriction.   On your next visit with your primary care physician please Get Medicines reviewed and adjusted.   Please request your Prim.MD to go over all Hospital Tests and Procedure/Radiological results at the follow up, please get all Hospital records sent to your Prim MD by signing hospital release before you go home.   If you experience worsening of your admission symptoms, develop shortness of breath, life threatening emergency, suicidal or homicidal thoughts you must seek medical attention immediately by calling 911 or calling your MD immediately  if symptoms less severe.  You Must read complete instructions/literature along with all the possible adverse reactions/side effects for all the Medicines you take and that have been prescribed to you. Take any new Medicines after you have completely understood and accpet all the possible adverse reactions/side effects.   Do not drive, operating heavy machinery, perform activities at heights, swimming or participation in water activities or provide baby sitting services if your were admitted for syncope or siezures until you have seen by Primary MD or a Neurologist and advised to do so again.  Do not drive when taking Pain medications.    Do not take more than prescribed Pain, Sleep and Anxiety Medications  Special Instructions: If you have smoked or chewed Tobacco  in the last 2 yrs please stop smoking, stop any  regular Alcohol  and or any Recreational drug use.  Wear Seat belts while driving.   Please note  You were cared for by a hospitalist during your hospital stay. If you have any questions about your discharge medications or the care you received while you were in the hospital after you are discharged, you can call the unit and asked to speak with the hospitalist on call if the hospitalist that took care of you is not available. Once you are discharged, your primary care physician will handle any further medical issues. Please note that NO REFILLS for any discharge medications will be authorized once you are discharged, as it is imperative that you return to your primary care physician (or establish a relationship with a primary care physician if you do not have one) for your aftercare needs so that they can reassess your need for medications and monitor your lab values.

## 2014-05-04 ENCOUNTER — Ambulatory Visit (INDEPENDENT_AMBULATORY_CARE_PROVIDER_SITE_OTHER): Payer: Medicare Other | Admitting: Physician Assistant

## 2014-05-04 ENCOUNTER — Encounter: Payer: Self-pay | Admitting: Physician Assistant

## 2014-05-04 VITALS — BP 148/72 | HR 72 | Ht 66.0 in | Wt 142.8 lb

## 2014-05-04 DIAGNOSIS — E785 Hyperlipidemia, unspecified: Secondary | ICD-10-CM

## 2014-05-04 DIAGNOSIS — I10 Essential (primary) hypertension: Secondary | ICD-10-CM | POA: Diagnosis not present

## 2014-05-04 NOTE — Assessment & Plan Note (Signed)
Recent left heart catheterization subsequent to a positive nuclear stress test. The heart cath revealed no changes in anatomy. Chest pain was thought to be GI related and she was discharged on Protonix. She did not fill the prescription side taken off her med list. She's had no further problems with chest pain. Follow-up with Dr. Ellyn Hack in 6 months.

## 2014-05-04 NOTE — Patient Instructions (Signed)
Your physician wants you to follow-up in 6 months with Dr. Ellyn Hack. You will receive a reminder letter in the mail 2 months in advance. If you do not receive a letter, please call our office to schedule the follow-up appointment.

## 2014-05-04 NOTE — Assessment & Plan Note (Signed)
The pressures mildly elevated today, however, the patient reports it does get low into the upper 90s. No changes to therapy

## 2014-05-04 NOTE — Progress Notes (Signed)
Patient ID: [name], female   DOB: 02-18-1932, 81 y.o.   MRN: [id number]    Date:  05/04/2014   ID:  [name], DOB 1931/10/07, MRN [id number]  PCP:  [name], MD  Primary Cardiologist:  Ellyn Hack    History of Present Illness: [name] is a 81 y.o. female with history of CAD status post CABG, hypertension and hyperlipidemia presents to the ER because of chest pain.  The patient's d-dimer was elevated CT angiogram of the chest was done which was negative for PE.  She underwent a nuclear stress test imaging which revealed a large area of moderate reversibility.  She subsequently underwent left heart catheterization which showed no change in anatomy.  Chest pain was thought to be GI related.  She presents today for post-hospital evaluation.  She reports doing quite well. She's had no further chest pain. She has not been taking the Protonix.  The patient currently denies nausea, vomiting, fever, chest pain, shortness of breath, orthopnea, dizziness, PND, cough, congestion, abdominal pain, hematochezia, melena, lower extremity edema, claudication.  Wt Readings from Last 3 Encounters:  05/04/14 142 lb 12.8 oz (64.774 kg)  04/29/14 138 lb 0.1 oz (62.6 kg)  05/16/13 144 lb (65.318 kg)     Past Medical History  Diagnosis Date  . Dyslipidemia     Statin intolerant  . Hypertension   . CAD in native artery   . Non-STEMI (non-ST elevated myocardial infarction) October 2005    EF by 35-40%, echo 40-50%. Angiography: 99% mid LAD involving D1 followed by 70% mid LAD; 80% RI. --> CABG  . 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  [name] CAD (coronary artery disease), autologous vein bypass graft March 2008    Occluded SVG ID 1 and occluded SVG-RI.  [name] Statin intolerance     Current Outpatient Prescriptions  Medication Sig Dispense Refill  . aspirin EC 81 MG tablet Take 81 mg by mouth daily.    [name] atenolol (TENORMIN) 25 MG tablet Take 1 tablet (25 mg total) by  mouth daily. 30 tablet 11  . bimatoprost (LUMIGAN) 0.03 % ophthalmic solution Place 1 drop into both eyes at bedtime.    . brimonidine (ALPHAGAN) 0.2 % ophthalmic solution Place 1 drop into both eyes 2 (two) times daily.    . Cholecalciferol (VITAMIN D3) 2000 UNITS capsule Take 2,000 Units by mouth daily.    . clopidogrel (PLAVIX) 75 MG tablet TAKE 1 TABLET BY MOUTH EVERY DAY 30 tablet 1  . lisinopril (PRINIVIL,ZESTRIL) 10 MG tablet TAKE 1 TABLET BY MOUTH EVERY DAY 30 tablet 1  . metroNIDAZOLE (METROCREAM) 0.75 % cream Apply 1 application topically 2 (two) times daily.    [name] triamcinolone cream (KENALOG) 0.1 % Apply 1 application topically as needed.     No current facility-administered medications for this visit.    Allergies:    Allergies  Allergen Reactions  . Nsaids   . Statins   . Tricor [Fenofibrate]   . Zetia [Ezetimibe]     Social History:  The patient  reports that she has never smoked. She has never used smokeless tobacco. She reports that she drinks about 0.5 oz of alcohol per week.   Family history:   Family History  Problem Relation Age of Onset  . Alzheimer's disease Mother   . Alzheimer's disease Sister   . Hyperlipidemia Sister   . Hypertension Sister   . Diabetes Sister   . Hyperlipidemia Sister   .  Hypertension Sister   . Pancreatitis Child     ROS:  Please see the history of present illness.  All other systems reviewed and negative.   PHYSICAL EXAM: VS:  BP 148/72 mmHg  Pulse 72  Ht 5\' 6"  (1.676 m)  Wt 142 lb 12.8 oz (64.774 kg)  BMI 23.06 kg/m2 Well nourished, well developed, in no acute distress HEENT: Pupils are equal round react to light accommodation extraocular movements are intact.  Neck: No cervical lymphadenopathy. Cardiac: Regular rate and rhythm without murmurs rubs or gallops. Lungs:  clear to auscultation bilaterally, no wheezing, rhonchi or rales Ext: Trace lower extremity edema.  2+ radial and dorsalis pedis pulses. Skin: warm and  dry Neuro:  Grossly normal    ASSESSMENT AND PLAN:  Problem List Items Addressed This Visit    Hypertension - Primary (Chronic)    The pressures mildly elevated today, however, the patient reports it does get low into the upper 90s. No changes to therapy    Hyperlipidemia LDL goal <70 (Chronic)    Statin intolerance.        Coronary artery disease the vein bypass graft and native arteries: Recent left heart catheterization subsequent to a positive nuclear stress test. The heart cath revealed no changes in anatomy. Chest pain was thought to be GI related and she was discharged on Protonix. She did not fill the prescription side taken off her med list. She's had no further problems with chest pain. Follow-up with Dr. Ellyn Hack in 6 months.

## 2014-05-04 NOTE — Assessment & Plan Note (Signed)
Statin intolerance 

## 2014-05-05 DIAGNOSIS — I10 Essential (primary) hypertension: Secondary | ICD-10-CM | POA: Diagnosis not present

## 2014-05-05 DIAGNOSIS — I2510 Atherosclerotic heart disease of native coronary artery without angina pectoris: Secondary | ICD-10-CM | POA: Diagnosis not present

## 2014-05-05 DIAGNOSIS — K8020 Calculus of gallbladder without cholecystitis without obstruction: Secondary | ICD-10-CM | POA: Diagnosis not present

## 2014-05-06 DIAGNOSIS — Z1211 Encounter for screening for malignant neoplasm of colon: Secondary | ICD-10-CM | POA: Diagnosis not present

## 2014-05-15 DIAGNOSIS — C44712 Basal cell carcinoma of skin of right lower limb, including hip: Secondary | ICD-10-CM | POA: Diagnosis not present

## 2014-05-18 ENCOUNTER — Other Ambulatory Visit: Payer: Self-pay | Admitting: Cardiology

## 2014-05-19 ENCOUNTER — Ambulatory Visit: Payer: Medicare Other | Admitting: Physician Assistant

## 2014-06-08 ENCOUNTER — Ambulatory Visit (INDEPENDENT_AMBULATORY_CARE_PROVIDER_SITE_OTHER): Payer: Medicare Other | Admitting: Podiatry

## 2014-06-08 DIAGNOSIS — L84 Corns and callosities: Secondary | ICD-10-CM | POA: Diagnosis not present

## 2014-06-08 NOTE — Progress Notes (Signed)
CALLUSES ARE THICK AND PAINFUL AGAIN

## 2014-06-10 NOTE — Progress Notes (Signed)
Subjective:     Patient ID: [name], female   DOB: 05/13/1931, 81 y.o.   MRN: [id number]  HPI patient presents with thick calluses on the plantar aspect of both feet that are painful for her   Review of Systems     Objective:   Physical Exam Neurovascular status intact with thick plantar lesions on both feet that are painful    Assessment:     Chronic lesion secondary to bone pressure    Plan:     Debridement lesions on both feet with no iatrogenic bleeding noted

## 2014-09-01 DIAGNOSIS — Z961 Presence of intraocular lens: Secondary | ICD-10-CM | POA: Diagnosis not present

## 2014-09-01 DIAGNOSIS — H4011X2 Primary open-angle glaucoma, moderate stage: Secondary | ICD-10-CM | POA: Diagnosis not present

## 2014-09-07 ENCOUNTER — Ambulatory Visit: Payer: Medicare Other | Admitting: Podiatry

## 2014-09-15 ENCOUNTER — Ambulatory Visit (INDEPENDENT_AMBULATORY_CARE_PROVIDER_SITE_OTHER): Payer: Medicare Other | Admitting: Podiatry

## 2014-09-15 DIAGNOSIS — L84 Corns and callosities: Secondary | ICD-10-CM

## 2014-09-15 NOTE — Progress Notes (Signed)
   Subjective:    Patient ID: [name], female    DOB: [birth year], 81 y.o.   MRN: [id number]  HPI "Patient presents today requesting B/L callous trim of feet,areas underneath great toes and is being seen by Dr Prudence Davidson."   Review of Systems  All other systems reviewed and are negative.  HPI Presents today chief complaint of painful elongated toenails.  Objective: Pulses are palpable bilateral nails are thick, yellow dystrophic onychomycosis and painful palpation.   Assessment: Onychomycosis with pain in limb.  Plan: Treatment of nails in thickness and length as covered service secondary to pain.     Objective:   Physical Exam        Assessment & Plan:

## 2014-10-12 ENCOUNTER — Other Ambulatory Visit: Payer: Self-pay | Admitting: Cardiology

## 2014-10-12 NOTE — Telephone Encounter (Signed)
refill 

## 2014-12-10 DIAGNOSIS — M25551 Pain in right hip: Secondary | ICD-10-CM | POA: Diagnosis not present

## 2014-12-10 DIAGNOSIS — Z96652 Presence of left artificial knee joint: Secondary | ICD-10-CM | POA: Diagnosis not present

## 2014-12-10 DIAGNOSIS — Z96653 Presence of artificial knee joint, bilateral: Secondary | ICD-10-CM | POA: Diagnosis not present

## 2014-12-10 DIAGNOSIS — Z471 Aftercare following joint replacement surgery: Secondary | ICD-10-CM | POA: Diagnosis not present

## 2014-12-10 DIAGNOSIS — Z96651 Presence of right artificial knee joint: Secondary | ICD-10-CM | POA: Diagnosis not present

## 2014-12-17 ENCOUNTER — Ambulatory Visit: Payer: Medicare Other | Admitting: Podiatry

## 2015-01-14 ENCOUNTER — Ambulatory Visit (INDEPENDENT_AMBULATORY_CARE_PROVIDER_SITE_OTHER): Payer: Medicare Other | Admitting: Podiatry

## 2015-01-14 ENCOUNTER — Encounter: Payer: Self-pay | Admitting: Podiatry

## 2015-01-14 DIAGNOSIS — L84 Corns and callosities: Secondary | ICD-10-CM

## 2015-01-14 DIAGNOSIS — Q828 Other specified congenital malformations of skin: Secondary | ICD-10-CM | POA: Diagnosis not present

## 2015-01-14 NOTE — Progress Notes (Signed)
Subjective:     Patient ID: [name], female   DOB: [birth year], 81 y.o.   MRN: [id number]  HPIThis patient presentsa to the office for painful calluses under the ball of both feet.  She says they were not hurting so she rescheduk\led her appointment to later date.  She says calluses are painful walking and wearing shoes,.   Review of Systems     Objective:   Physical Exam GENERAL APPEARANCE: Alert, conversant. Appropriately groomed. No acute distress.  VASCULAR: Pedal pulses palpable at  [facility] and PT bilateral.  Capillary refill time is immediate to all digits,  Normal temperature gradient.  Digital hair growth is present bilateral  NEUROLOGIC: sensation is normal to 5.07 monofilament at 5/5 sites bilateral.  Light touch is intact bilateral, Muscle strength normal.  MUSCULOSKELETAL: acceptable muscle strength, tone and stability bilateral.  Intrinsic muscluature intact bilateral.  Rectus appearance of foot and digits noted bilateral.   DERMATOLOGIC: skin color, texture, and turgor are within normal limits.  No preulcerative lesions or ulcers  are seen, no interdigital maceration noted.  No open lesions present.  Digital nails are asymptomatic. No drainage noted. Porokeratosis noted sub 1 B/L.      Assessment:     Porokeratosis B/L     Plan:     Debride porokeratosis

## 2015-01-21 DIAGNOSIS — M7072 Other bursitis of hip, left hip: Secondary | ICD-10-CM | POA: Diagnosis not present

## 2015-01-21 DIAGNOSIS — M7071 Other bursitis of hip, right hip: Secondary | ICD-10-CM | POA: Diagnosis not present

## 2015-03-02 DIAGNOSIS — H401132 Primary open-angle glaucoma, bilateral, moderate stage: Secondary | ICD-10-CM | POA: Diagnosis not present

## 2015-03-02 DIAGNOSIS — Z0100 Encounter for examination of eyes and vision without abnormal findings: Secondary | ICD-10-CM | POA: Diagnosis not present

## 2015-03-02 DIAGNOSIS — Z961 Presence of intraocular lens: Secondary | ICD-10-CM | POA: Diagnosis not present

## 2015-03-08 ENCOUNTER — Other Ambulatory Visit: Payer: Self-pay

## 2015-03-08 NOTE — Telephone Encounter (Signed)
90 day prescription request was sent in for Atenolol 25 mg. Pt already has 2 refills at the pharmacy so I did not refill it.  Pt was last seen in the office Jan of the this year and asked to come back and see the Dr. within 6 months. I called the pharmacy and the lady said she will let the pt know that she needs to make an appointment in order to get more refills.

## 2015-03-09 DIAGNOSIS — Z23 Encounter for immunization: Secondary | ICD-10-CM | POA: Diagnosis not present

## 2015-03-19 DIAGNOSIS — M25552 Pain in left hip: Secondary | ICD-10-CM | POA: Diagnosis not present

## 2015-03-19 DIAGNOSIS — M25551 Pain in right hip: Secondary | ICD-10-CM | POA: Diagnosis not present

## 2015-04-08 DIAGNOSIS — M25552 Pain in left hip: Secondary | ICD-10-CM | POA: Diagnosis not present

## 2015-04-08 DIAGNOSIS — M25551 Pain in right hip: Secondary | ICD-10-CM | POA: Diagnosis not present

## 2015-04-19 DIAGNOSIS — L820 Inflamed seborrheic keratosis: Secondary | ICD-10-CM | POA: Diagnosis not present

## 2015-04-19 DIAGNOSIS — D485 Neoplasm of uncertain behavior of skin: Secondary | ICD-10-CM | POA: Diagnosis not present

## 2015-04-19 DIAGNOSIS — L821 Other seborrheic keratosis: Secondary | ICD-10-CM | POA: Diagnosis not present

## 2015-04-19 DIAGNOSIS — Z85828 Personal history of other malignant neoplasm of skin: Secondary | ICD-10-CM | POA: Diagnosis not present

## 2015-04-19 DIAGNOSIS — L719 Rosacea, unspecified: Secondary | ICD-10-CM | POA: Diagnosis not present

## 2015-04-19 DIAGNOSIS — D225 Melanocytic nevi of trunk: Secondary | ICD-10-CM | POA: Diagnosis not present

## 2015-05-06 ENCOUNTER — Ambulatory Visit (INDEPENDENT_AMBULATORY_CARE_PROVIDER_SITE_OTHER): Payer: Medicare Other | Admitting: Podiatry

## 2015-05-06 ENCOUNTER — Encounter: Payer: Self-pay | Admitting: Podiatry

## 2015-05-06 DIAGNOSIS — Q828 Other specified congenital malformations of skin: Secondary | ICD-10-CM

## 2015-05-06 NOTE — Progress Notes (Signed)
Subjective:     Patient ID: [name], female   DOB: 1932/02/04, 82 y.o.   MRN: [id number]  HPIThis patient presentsa to the office for painful calluses under the ball of both feet.  She says calluses are painful walking and wearing shoes,.She presents for preventive footcare services.   Review of Systems     Objective:   Physical Exam GENERAL APPEARANCE: Alert, conversant. Appropriately groomed. No acute distress.  VASCULAR: Pedal pulses palpable at  [facility] and PT bilateral.  Capillary refill time is immediate to all digits,  Normal temperature gradient.  Digital hair growth is present bilateral  NEUROLOGIC: sensation is normal to 5.07 monofilament at 5/5 sites bilateral.  Light touch is intact bilateral, Muscle strength normal.  MUSCULOSKELETAL: acceptable muscle strength, tone and stability bilateral.  Intrinsic muscluature intact bilateral.  Rectus appearance of foot and digits noted bilateral.   DERMATOLOGIC: skin color, texture, and turgor are within normal limits.  No preulcerative lesions or ulcers  are seen, no interdigital maceration noted.  No open lesions present.  Digital nails are asymptomatic. No drainage noted. Porokeratosis noted sub 1 B/L.      Assessment:     Porokeratosis B/L     Plan:     Debride porokeratosis RTC 3 months   Gardiner Barefoot DPM

## 2015-05-11 ENCOUNTER — Encounter: Payer: Self-pay | Admitting: Cardiology

## 2015-05-11 ENCOUNTER — Ambulatory Visit (INDEPENDENT_AMBULATORY_CARE_PROVIDER_SITE_OTHER): Payer: Medicare Other | Admitting: Cardiology

## 2015-05-11 VITALS — BP 152/82 | HR 80 | Ht 66.0 in | Wt 144.0 lb

## 2015-05-11 DIAGNOSIS — Z789 Other specified health status: Secondary | ICD-10-CM

## 2015-05-11 DIAGNOSIS — R079 Chest pain, unspecified: Secondary | ICD-10-CM

## 2015-05-11 DIAGNOSIS — E785 Hyperlipidemia, unspecified: Secondary | ICD-10-CM | POA: Diagnosis not present

## 2015-05-11 DIAGNOSIS — I10 Essential (primary) hypertension: Secondary | ICD-10-CM | POA: Diagnosis not present

## 2015-05-11 DIAGNOSIS — Z01810 Encounter for preprocedural cardiovascular examination: Secondary | ICD-10-CM

## 2015-05-11 DIAGNOSIS — Z951 Presence of aortocoronary bypass graft: Secondary | ICD-10-CM | POA: Diagnosis not present

## 2015-05-11 DIAGNOSIS — I25810 Atherosclerosis of coronary artery bypass graft(s) without angina pectoris: Secondary | ICD-10-CM | POA: Diagnosis not present

## 2015-05-11 DIAGNOSIS — I214 Non-ST elevation (NSTEMI) myocardial infarction: Secondary | ICD-10-CM

## 2015-05-11 DIAGNOSIS — Z889 Allergy status to unspecified drugs, medicaments and biological substances status: Secondary | ICD-10-CM

## 2015-05-11 DIAGNOSIS — R0789 Other chest pain: Secondary | ICD-10-CM

## 2015-05-11 MED ORDER — ATENOLOL 25 MG PO TABS: 25.0000 mg | ORAL_TABLET | Freq: Every day | ORAL | Status: DC

## 2015-05-11 NOTE — Progress Notes (Signed)
PCP: [name], MD  Clinic Note: Chief Complaint  Patient presents with  . Annual Exam  . Chest Pain    DISCOMFORT  . Dizziness    HPI: [name] is a 82 y.o. female with a PMH below who presents today for 1 year follow-up of CAD-CABG, and for preoperative evaluation.[name] She was first diagnosed with coronary disease back in October 2005 when she had a non-STEMI. She was referred for CABG.  She also has hypertension and dyslipidemia.  OSHYN METRO was last seen in Jan 2016 by Mr. Samara Snide, [place] -- this was following a hospital stay where she had a Myoview that showed a large area of moderate reversibility. She underwent cardiac catheterization showing no change in anatomy. Chest pain was thought to be GI related.  Recent Hospitalizations: None since last clinic visit.  Studies Reviewed:   04/27/2014: Echocardiogram, EF 50-55%.  Normal regional wall motion. Grade 1 diastolic dysfunction. No valvular lesions.  December 2015 Myoview: There is a large reversible defect involving the anterior and inferior lateral wall. No fixed defects identified. -- HIGH RISK --> Referred for cath.  04/28/2014, Cardiac Cath: Chronic occlusion of SVG-diagonal, SVG-RI with patent LIMA-LAD. Patent native circumflex, LAD and RCA. 70% stenosis in a branch of RI. --> Does not explain "high risk perfusion study "  Interval History: [name] presents today doing relatively well. She really doesn't have much in with complaints. She has intermittent episodes of chest discomfort, but is not exertional, and not like her previous anginal symptoms. It sometimes with certain movements. She also notes occasional positional dizziness and was standing up really fast. No active anginal chest pain or dyspnea with rest or exertion. No heart failure symptoms of D, orthopnea or edema. No palpitations, lightheadedness, dizziness, weakness or syncope/near syncope. No TIA/amaurosis fugax symptoms. No  claudication.  ROS: A comprehensive was performed. Review of Systems  HENT: Negative for nosebleeds.   Eyes: Positive for blurred vision.  Cardiovascular: Negative for claudication.       Per history of present illness  Gastrointestinal: Negative for blood in stool and melena.  Genitourinary: Negative for hematuria.  Neurological: Positive for dizziness (mostly positional).  Endo/Heme/Allergies: Does not bruise/bleed easily.  Psychiatric/Behavioral: Negative.   All other systems reviewed and are negative.   Past Medical History  Diagnosis Date  . Dyslipidemia     Statin intolerant  . Hypertension   . CAD in native artery   . Non-STEMI (non-ST elevated myocardial infarction) [facility]) October 2005    EF by 35-40%, echo 40-50%. Angiography: 99% mid LAD involving D1 followed by 70% mid LAD; 80% RI. --> CABG  . 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  [name] CAD (coronary artery disease), autologous vein bypass graft March 2008    Follow-up cath: December 2015 Occluded SVG-D1 and occluded SVG-RI.  [name] Statin intolerance     Past Surgical History  Procedure Laterality Date  . Transthoracic echocardiogram  02/19/2004; December 2015    a. EF 45-50%, Normal LV function, moderate hypokinesis of anterior wall.;; b. EF 50-55%. No RWMA, GR 1 DD. Normal valves   . Cardiac catheterization  02/19/2004    Significant 2 vessel CAD - LAD, D1 and RI  . Coronary artery bypass graft  02/22/2004    x3. LIMA to distal LAD, SVG to first diag, SVG to ramus. SVG harvest from rt thigh.  . Cardiovascular stress test  05/23/2006    Mild lateral/inferolateral ischemia, likely due to  occluded SVGs with exhisting disease.  . Cardiac catheterization  07/25/2006    Totally occluded SVG to diag and ramus. Patent LIMA-LAD. Ramus 70-80% proximal stenosis and occluded vein graft.  . Carotid doppler  07/15/2009    Bilat ICAs 0-49% diameter reduction. Normal patency of Bilat subclavian arteries.  . Nm myoview  ltd  January 2008; December 2015    a. Referred for For mild inferolateral and anterolateral/apical lateral defect with mild reversibility.;; b. Large defect in the anterior and inferior wall. Suggestive of potential infarct plus ischemia. HIGH RISK.  [name] Left heart catheterization with coronary/graft angiogram N/A 04/28/2014    Procedure: LEFT HEART CATHETERIZATION WITH Beatrix Fetters;  Surgeon: Sinclair Grooms, MD;  Location: [facility] CATH LAB;  Service: Cardiovascular;  Laterality: N/A;   Prior to Admission medications   Medication Sig Start Date End Date Taking? Authorizing Provider  aspirin EC 81 MG tablet Take 81 mg by mouth daily.   Yes Historical Provider, MD  atenolol (TENORMIN) 25 MG tablet Take 1 tablet (25 mg total) by mouth daily. 05/11/15  Yes Leonie Man, MD  bimatoprost (LUMIGAN) 0.03 % ophthalmic solution Place 1 drop into both eyes at bedtime.   Yes Historical Provider, MD  brimonidine (ALPHAGAN) 0.2 % ophthalmic solution Place 1 drop into both eyes 2 (two) times daily.   Yes Historical Provider, MD  Cholecalciferol (VITAMIN D3) 2000 UNITS capsule Take 2,000 Units by mouth daily.   Yes Historical Provider, MD  clopidogrel (PLAVIX) 75 MG tablet TAKE 1 TABLET BY MOUTH EVERY DAY 05/18/14  Yes Leonie Man, MD  lisinopril (PRINIVIL,ZESTRIL) 10 MG tablet TAKE 1 TABLET BY MOUTH EVERY DAY 05/18/14  Yes Leonie Man, MD  metroNIDAZOLE (METROCREAM) 0.75 % cream Apply 1 application topically 2 (two) times daily. 04/17/13  Yes Historical Provider, MD  triamcinolone cream (KENALOG) 0.1 % Apply 1 application topically as needed. 03/31/13  Yes Historical Provider, MD   Allergies  Allergen Reactions  . Nsaids   . Statins   . Tricor [Fenofibrate]   . Zetia [Ezetimibe]     Social History   Social History  . Marital Status: Widowed    Spouse Name: N/A  . Number of Children: N/A  . Years of Education: N/A   Social History Main Topics  . Smoking status: Never Smoker   .  Smokeless tobacco: Never Used  . Alcohol Use: 0.5 oz/week    1 drink(s) per week  . Drug Use: None  . Sexual Activity: Not Asked   Other Topics Concern  . None   Social History Narrative   She is a widowed, mother of 2 grandmother of 3. She used to do exercising through the [employer] program as well as the [employer]. Unfortunately, this finding of the classes for seniors changed, and she was unable to make the new classes. She did not like exercising with non-seniors. Besides that she does existing disease, walks up and down the stairs, walk her dog. She is not as active as he had been before.   She never smoked, never drank alcohol.          Family History  Problem Relation Age of Onset  . Alzheimer's disease Mother   . Alzheimer's disease Sister   . Hyperlipidemia Sister   . Hypertension Sister   . Diabetes Sister   . Hyperlipidemia Sister   . Hypertension Sister   . Pancreatitis Child    Wt Readings from Last 3 Encounters:  05/11/15 144  lb (65.318 kg)  05/04/14 142 lb 12.8 oz (64.774 kg)  04/29/14 138 lb 0.1 oz (62.6 kg)    PHYSICAL EXAM BP 152/82 mmHg  Pulse 80  Ht 5\' 6"  (1.676 m)  Wt 144 lb (65.318 kg)  BMI 23.25 kg/m2 General appearance: alert, cooperative, appears stated age and no distress  Neck: no adenopathy, no carotid bruit, no JVD, supple, symmetrical, trachea midline and thyroid not enlarged, symmetric, no tenderness/mass/nodules  Lungs: clear to auscultation bilaterally and normal percussion bilaterally  Heart: regular rate and rhythm, S1, S2 normal, no murmur, click, rub or gallop and normal apical impulse  Abdomen: soft, non-tender; bowel sounds normal; no masses, no organomegaly  Extremities: extremities normal, atraumatic, no cyanosis or edema  Pulses: 2+ and symmetric  Neurologic: Grossly normal   Adult ECG Report  Rate: 80 ;  Rhythm: sinus arrhythmia, atrial fibrillation and Normal axis, intervals and duration;   Narrative Interpretation:  Normal EKG  Other studies Reviewed: Additional studies/ records that were reviewed today include:  Recent Labs:  - Followed by PCP No results found for: CHOL, HDL, LDLCALC, LDLDIRECT, TRIG, CHOLHDL   ASSESSMENT / PLAN: Problem List Items Addressed This Visit    Statin intolerance (Chronic)    She was to be been seen in the [facility] clinic, but has not followed up. We'll wait until we see new lab results before making a decision.      S/P CABG x 3 - Primary (Chronic)   Relevant Orders   EKG 12-Lead (Completed)   Preoperative cardiovascular examination (Chronic)    PREOPERATIVE CARDIAC RISK ASSESSMENT   Revised Cardiac Risk Index:  High Risk Surgery: no;  Defined as Intraperitoneal, intrathoracic or suprainguinal vascular  Active CAD: no; no active symptoms, albeit that she has occluded grafts  CHF: yes; borderline, and not regularly  Cerebrovascular Disease: no;   Diabetes: no; On Insulin: no  CKD (Cr >~ 2): no;  Total: Borderline 1 Estimated Risk of Adverse Outcome: LOW  Estimated Risk of MI, PE, VF/VT (Cardiac Arrest), Complete Heart Block: <1 %  Despite her history of coronary disease, she is not actively having symptoms. I would not do a screening Myoview simply for the purpose of preoperative evaluation basilar but her last one was a false positive. Recommendation would be to proceed with planned procedure with holding at minimum Plavix plus or minus aspirin ACC/AHA Guidelines for "Clearance":  Step 1 - Need for Emergency Surgery: No:   If Yes - go straight to OR with perioperative surveillance  Step 2 - Active Cardiac Conditions (Unstable Angina, Decompensated HF, Significant  Arrhytmias - Complete HB, Mobitz II, Symptomatic VT or SVT, Severe Aortic Stenosis - mean gradient > 40 mmHg, Valve area < 1.0 cm2):   No:   If Yes - Evaluate & Treat per ACC/AHA Guidelines  Step 3 -  Low Risk Surgery: Yes  If Yes --> proceed to OR  If No --> Step  4  Step 4 - Functional Capacity >= 4 METS without symptoms: Yes  If Yes --> proceed to OR  If No --> Step 5  Step 5 --  Clinical Risk Factors (CRF)   3 or more: No:   If Yes -- assess Surgical Risk, --   (High Risk Non-cardiac), Intraabdominal or thoracic vascular surgery consider testing if it will change management.  Intermediate Risk: Proceed to OR with HR control, or consider testing if it will change management Planned procedure is low risk. Therefore would prefer to go  forward with the program without further cardiac evaluation.  He would be considered a low-intermediate risk patient for a low-risk procedure.       Non-STEMI (non-ST elevated myocardial infarction) ([name]) (Chronic)    I suspect that her inferolateral defect on Myoview was related to her infarct. This is likely related to the occlusion of her graft to either the ramus or circumflex. She has not had any recurrent symptoms besides some mild more musculoskeletal sounding chest discomfort. She remains on aspirin and Plavix as well as atenolol and lisinopril. EF is now low normal.      Relevant Medications   atenolol (TENORMIN) 25 MG tablet   Hyperlipidemia LDL goal <70 (Chronic)    She does not do well with statins. She is due for lipids to be checked by her PCP. At this point in time would try to shoot for goal. If his very poorly controlled, may need to consider PCSK9 inhibitor / referral to the clinic      Relevant Medications   atenolol (TENORMIN) 25 MG tablet   Other Relevant Orders   EKG 12-Lead (Completed)   Essential hypertension (Chronic)    Blood pressure still mildly elevated day. She is on atenolol and lisinopril. If blood pressures continue to be somewhat elevated we may need to consider increasing the ACE inhibitor dose      Relevant Medications   atenolol (TENORMIN) 25 MG tablet   Other Relevant Orders   EKG 12-Lead (Completed)   Chest pain of uncertain etiology    She's having weird  symptoms don't really have any association with exertion or not. She actually feels pretty well for now, would not look to evaluate for ischemic symptoms, as they are very mild and atypical.      CAD (coronary artery disease), autologous vein bypass graft (Chronic)    As noted, no active anginal symptoms. On stable regimen for prior active CAD disease. Is on beta blocker and ACE inhibitor. I would not repeat an Myoview on her unless she is having symptoms, mostly because of the recent false positive study.  Monitor for symptoms. Otherwise stable      Relevant Medications   atenolol (TENORMIN) 25 MG tablet   Other Relevant Orders   EKG 12-Lead (Completed)      Current medicines are reviewed at length with the patient today. (+/- concerns) preoperative evaluation for hip procedure. The following changes have been made: Okay to hold aspirin and Plavix 7 days prior to surgery.  ROV 12 months  Studies Ordered:   Orders Placed This Encounter  Procedures  . EKG 12-Lead      Leonie Man, M.D., M.S. Interventional Cardiologist   Pager # [phone]

## 2015-05-11 NOTE — Patient Instructions (Signed)
YOU HAVE CLEARANCE FOR SURGERY WITH DR Wynelle Link- MAY HOLD PLAVIX AND ASPIRIN 7 DAYS PRIOR TO SURGERY.   NO CHANGE IN CURRENT MEDICATIONS.   Your physician wants you to follow-up in [place].  You will receive a reminder letter in the mail two months in advance. If you don't receive a letter, please call our office to schedule the follow-up appointment.   If you need a refill on your cardiac medications before your next appointment, please call your pharmacy.

## 2015-05-13 ENCOUNTER — Encounter: Payer: Self-pay | Admitting: Cardiology

## 2015-05-13 DIAGNOSIS — Z01810 Encounter for preprocedural cardiovascular examination: Secondary | ICD-10-CM | POA: Insufficient documentation

## 2015-05-13 NOTE — Assessment & Plan Note (Signed)
As noted, no active anginal symptoms. On stable regimen for prior active CAD disease. Is on beta blocker and ACE inhibitor. I would not repeat an Myoview on her unless she is having symptoms, mostly because of the recent false positive study.  Monitor for symptoms. Otherwise stable

## 2015-05-13 NOTE — Assessment & Plan Note (Signed)
She does not do well with statins. She is due for lipids to be checked by her PCP. At this point in time would try to shoot for goal. If his very poorly controlled, may need to consider PCSK9 inhibitor / referral to the clinic

## 2015-05-13 NOTE — Assessment & Plan Note (Signed)
PREOPERATIVE CARDIAC RISK ASSESSMENT   Revised Cardiac Risk Index:  High Risk Surgery: no;  Defined as Intraperitoneal, intrathoracic or suprainguinal vascular  Active CAD: no; no active symptoms, albeit that she has occluded grafts  CHF: yes; borderline, and not regularly  Cerebrovascular Disease: no;   Diabetes: no; On Insulin: no  CKD (Cr >~ 2): no;  Total: Borderline 1 Estimated Risk of Adverse Outcome: LOW  Estimated Risk of MI, PE, VF/VT (Cardiac Arrest), Complete Heart Block: <1 %  Despite her history of coronary disease, she is not actively having symptoms. I would not do a screening Myoview simply for the purpose of preoperative evaluation basilar but her last one was a false positive. Recommendation would be to proceed with planned procedure with holding at minimum Plavix plus or minus aspirin ACC/AHA Guidelines for "Clearance":  Step 1 - Need for Emergency Surgery: No:   If Yes - go straight to OR with perioperative surveillance  Step 2 - Active Cardiac Conditions (Unstable Angina, Decompensated HF, Significant  Arrhytmias - Complete HB, Mobitz II, Symptomatic VT or SVT, Severe Aortic Stenosis - mean gradient > 40 mmHg, Valve area < 1.0 cm2):   No:   If Yes - Evaluate & Treat per ACC/AHA Guidelines  Step 3 -  Low Risk Surgery: Yes  If Yes --> proceed to OR  If No --> Step 4  Step 4 - Functional Capacity >= 4 METS without symptoms: Yes  If Yes --> proceed to OR  If No --> Step 5  Step 5 --  Clinical Risk Factors (CRF)   3 or more: No:   If Yes -- assess Surgical Risk, --   (High Risk Non-cardiac), Intraabdominal or thoracic vascular surgery consider testing if it will change management.  Intermediate Risk: Proceed to OR with HR control, or consider testing if it will change management Planned procedure is low risk. Therefore would prefer to go forward with the program without further cardiac evaluation.  He would be considered a low-intermediate  risk patient for a low-risk procedure.

## 2015-05-13 NOTE — Assessment & Plan Note (Signed)
Blood pressure still mildly elevated day. She is on atenolol and lisinopril. If blood pressures continue to be somewhat elevated we may need to consider increasing the ACE inhibitor dose

## 2015-05-13 NOTE — Assessment & Plan Note (Signed)
She's having weird symptoms don't really have any association with exertion or not. She actually feels pretty well for now, would not look to evaluate for ischemic symptoms, as they are very mild and atypical.

## 2015-05-13 NOTE — Assessment & Plan Note (Signed)
She was to be been seen in the [facility] [place] clinic, but has not followed up. We'll wait until we see new lab results before making a decision.

## 2015-05-13 NOTE — Assessment & Plan Note (Signed)
I suspect that her inferolateral defect on Myoview was related to her infarct. This is likely related to the occlusion of her graft to either the ramus or circumflex. She has not had any recurrent symptoms besides some mild more musculoskeletal sounding chest discomfort. She remains on aspirin and Plavix as well as atenolol and lisinopril. EF is now low normal.

## 2015-05-14 DIAGNOSIS — Z1231 Encounter for screening mammogram for malignant neoplasm of breast: Secondary | ICD-10-CM | POA: Diagnosis not present

## 2015-06-01 ENCOUNTER — Ambulatory Visit: Payer: Self-pay | Admitting: Orthopedic Surgery

## 2015-06-01 NOTE — Progress Notes (Signed)
Preoperative surgical orders have been place into the Epic hospital system for [name] on 06/01/2015, 9:08 PM  by Mickel Crow for surgery on 06/15/2015.  Preop Hip orders including Experel Injecion, IV Tylenol, and IV Decadron as long as there are no contraindications to the above medications. [name], PA-C

## 2015-06-08 ENCOUNTER — Encounter (HOSPITAL_COMMUNITY): Payer: Self-pay

## 2015-06-08 ENCOUNTER — Encounter (HOSPITAL_COMMUNITY)
Admission: RE | Admit: 2015-06-08 | Discharge: 2015-06-08 | Disposition: A | Discharge status: Home / Self Care | Payer: Medicare Other | Source: Ambulatory Visit | Attending: Orthopedic Surgery | Admitting: Orthopedic Surgery

## 2015-06-08 DIAGNOSIS — M7071 Other bursitis of hip, right hip: Secondary | ICD-10-CM | POA: Insufficient documentation

## 2015-06-08 DIAGNOSIS — Z01812 Encounter for preprocedural laboratory examination: Secondary | ICD-10-CM | POA: Insufficient documentation

## 2015-06-08 HISTORY — DX: Unspecified osteoarthritis, unspecified site: M19.90

## 2015-06-08 HISTORY — DX: Other specified postprocedural states: Z98.890

## 2015-06-08 HISTORY — DX: Nausea with vomiting, unspecified: R11.2

## 2015-06-08 HISTORY — DX: Bursopathy, unspecified: M71.9

## 2015-06-08 HISTORY — DX: Unspecified glaucoma: H40.9

## 2015-06-08 LAB — BASIC METABOLIC PANEL
Anion gap: 9 (ref 5–15)
BUN: 19 mg/dL (ref 6–20)
CALCIUM: 10.3 mg/dL (ref 8.9–10.3)
CHLORIDE: 112 mmol/L — AB (ref 101–111)
CO2: 24 mmol/L (ref 22–32)
CREATININE: 0.82 mg/dL (ref 0.44–1.00)
Glucose, Bld: 104 mg/dL — ABNORMAL HIGH (ref 65–99)
Potassium: 4 mmol/L (ref 3.5–5.1)
SODIUM: 145 mmol/L (ref 135–145)

## 2015-06-08 LAB — CBC
HCT: 41.4 % (ref 36.0–46.0)
Hemoglobin: 13.8 g/dL (ref 12.0–15.0)
MCH: 31.3 pg (ref 26.0–34.0)
MCHC: 33.3 g/dL (ref 30.0–36.0)
MCV: 93.9 fL (ref 78.0–100.0)
PLATELETS: 266 10*3/uL (ref 150–400)
RBC: 4.41 MIL/uL (ref 3.87–5.11)
RDW: 12.6 % (ref 11.5–15.5)
WBC: 5.2 10*3/uL (ref 4.0–10.5)

## 2015-06-08 NOTE — Patient Instructions (Signed)
[name]  06/08/2015   Your procedure is scheduled on: Tuesday June 15, 2015   Report to [facility] Main  Entrance take [place]  elevators to 3rd floor to  [place] at 11:00 AM.  Call this number if you have problems the morning of surgery [phone]   Remember: ONLY 1 PERSON MAY GO WITH YOU TO SHORT STAY TO GET  READY MORNING OF [place].  Do not eat food After Midnight but may take clear liquids till 7:00 am day of surgery then nothing by mouth.      Take these medicines the morning of surgery: May use eye drops                                 You may not have any metal on your body including hair pins and              piercings  Do not wear jewelry, make-up, lotions, powders or perfumes, deodorant             Do not wear nail polish.  Do not shave  48 hours prior to surgery.              Do not bring valuables to the hospital. [place].  Contacts, dentures or bridgework may not be worn into surgery.  Leave suitcase in the car. After surgery it may be brought to your room.                Please read over the following fact sheets you were given:INCENTIVE SPIROMETER  _____________________________________________________________________             [facility] - Preparing for Surgery Before surgery, you can play an important role.  Because skin is not sterile, your skin needs to be as free of germs as possible.  You can reduce the number of germs on your skin by washing with CHG (chlorahexidine gluconate) soap before surgery.  CHG is an antiseptic cleaner which kills germs and bonds with the skin to continue killing germs even after washing. Please DO NOT use if you have an allergy to CHG or antibacterial soaps.  If your skin becomes reddened/irritated stop using the CHG and inform your nurse when you arrive at Short Stay. Do not shave (including legs and underarms) for at least 48  hours prior to the first CHG shower.  You may shave your face/neck. Please follow these instructions carefully:  1.  Shower with CHG Soap the night before surgery and the  morning of Surgery.  2.  If you choose to wash your hair, wash your hair first as usual with your  normal  shampoo.  3.  After you shampoo, rinse your hair and body thoroughly to remove the  shampoo.                           4.  Use CHG as you would any other liquid soap.  You can apply chg directly  to the skin and wash                       Gently with a scrungie or clean  washcloth.  5.  Apply the CHG Soap to your body ONLY FROM THE NECK DOWN.   Do not use on face/ open                           Wound or open sores. Avoid contact with eyes, ears mouth and genitals (private parts).                       Wash face,  Genitals (private parts) with your normal soap.             6.  Wash thoroughly, paying special attention to the area where your surgery  will be performed.  7.  Thoroughly rinse your body with warm water from the neck down.  8.  DO NOT shower/wash with your normal soap after using and rinsing off  the CHG Soap.                9.  Pat yourself dry with a clean towel.            10.  Wear clean pajamas.            11.  Place clean sheets on your bed the night of your first shower and do not  sleep with pets. Day of Surgery : Do not apply any lotions/deodorants the morning of surgery.  Please wear clean clothes to the hospital/surgery center.  FAILURE TO FOLLOW THESE INSTRUCTIONS MAY RESULT IN THE CANCELLATION OF YOUR SURGERY PATIENT SIGNATURE_________________________________  NURSE SIGNATURE__________________________________  ________________________________________________________________________    CLEAR LIQUID DIET   Foods Allowed                                                                     Foods Excluded  Coffee and tea, regular and decaf                             liquids that you cannot   Plain Jell-O in any flavor                                             see through such as: Fruit ices (not with fruit pulp)                                     milk, soups, orange juice  Iced Popsicles                                    All solid food Carbonated beverages, regular and diet                                    Cranberry, grape and apple juices Sports drinks like Gatorade Lightly seasoned clear broth or consume(fat free) Sugar, honey syrup  Sample Menu Breakfast  Lunch                                     Supper Cranberry juice                    Beef broth                            Chicken broth Jell-O                                     Grape juice                           Apple juice Coffee or tea                        Jell-O                                      Popsicle                                                Coffee or tea                        Coffee or tea  _____________________________________________________________________    Incentive Spirometer  An incentive spirometer is a tool that can help keep your lungs clear and active. This tool measures how well you are filling your lungs with each breath. Taking long deep breaths may help reverse or decrease the chance of developing breathing (pulmonary) problems (especially infection) following:  A long period of time when you are unable to move or be active. BEFORE THE PROCEDURE   If the spirometer includes an indicator to show your best effort, your nurse or respiratory therapist will set it to a desired goal.  If possible, sit up straight or lean slightly forward. Try not to slouch.  Hold the incentive spirometer in an upright position. INSTRUCTIONS FOR USE  1. Sit on the edge of your bed if possible, or sit up as far as you can in bed or on a chair. 2. Hold the incentive spirometer in an upright position. 3. Breathe out normally. 4. Place the mouthpiece in your mouth  and seal your lips tightly around it. 5. Breathe in slowly and as deeply as possible, raising the piston or the ball toward the top of the column. 6. Hold your breath for 3-5 seconds or for as long as possible. Allow the piston or ball to fall to the bottom of the column. 7. Remove the mouthpiece from your mouth and breathe out normally. 8. Rest for a few seconds and repeat Steps 1 through 7 at least 10 times every 1-2 hours when you are awake. Take your time and take a few normal breaths between deep breaths. 9. The spirometer may include an indicator to show your best effort. Use the indicator as a goal to work toward during each repetition. 10. After each set of 10 deep breaths,  practice coughing to be sure your lungs are clear. If you have an incision (the cut made at the time of surgery), support your incision when coughing by placing a pillow or rolled up towels firmly against it. Once you are able to get out of bed, walk around indoors and cough well. You may stop using the incentive spirometer when instructed by your caregiver.  RISKS AND COMPLICATIONS  Take your time so you do not get dizzy or light-headed.  If you are in pain, you may need to take or ask for pain medication before doing incentive spirometry. It is harder to take a deep breath if you are having pain. AFTER USE  Rest and breathe slowly and easily.  It can be helpful to keep track of a log of your progress. Your caregiver can provide you with a simple table to help with this. If you are using the spirometer at home, follow these instructions: [place] IF:   You are having difficultly using the spirometer.  You have trouble using the spirometer as often as instructed.  Your pain medication is not giving enough relief while using the spirometer.  You develop fever of 100.5 F (38.1 C) or higher. SEEK IMMEDIATE MEDICAL CARE IF:   You cough up bloody sputum that had not been present before.  You develop  fever of 102 F (38.9 C) or greater.  You develop worsening pain at or near the incision site. MAKE SURE YOU:   Understand these instructions.  Will watch your condition.  Will get help right away if you are not doing well or get worse. Document Released: 08/28/2006 Document Revised: 07/10/2011 Document Reviewed: 10/29/2006 [facility] Patient Information 2014 [place], [place].   ________________________________________________________________________

## 2015-06-08 NOTE — Progress Notes (Addendum)
EKG/EPIC 05/11/2015 Stress test / epic 04/27/2014  ECHO/epic 04/27/2014  LOV note per cardiology / 05/11/2015 on chart and in epic noting clearance

## 2015-06-09 ENCOUNTER — Other Ambulatory Visit: Payer: Self-pay | Admitting: Cardiology

## 2015-06-09 NOTE — Telephone Encounter (Signed)
Rx(s) sent to pharmacy electronically.  

## 2015-06-15 ENCOUNTER — Encounter (HOSPITAL_COMMUNITY): Payer: Self-pay | Admitting: *Deleted

## 2015-06-15 ENCOUNTER — Ambulatory Visit (HOSPITAL_COMMUNITY): Payer: Medicare Other | Admitting: Anesthesiology

## 2015-06-15 ENCOUNTER — Observation Stay (HOSPITAL_COMMUNITY)
Admission: RE | Admit: 2015-06-15 | Discharge: 2015-06-16 | Disposition: A | Discharge status: Home / Self Care | Payer: Medicare Other | Source: Ambulatory Visit | Attending: Orthopedic Surgery | Admitting: Orthopedic Surgery

## 2015-06-15 ENCOUNTER — Encounter (HOSPITAL_COMMUNITY)
Admission: RE | Disposition: A | Discharge status: Home / Self Care | Payer: Self-pay | Source: Ambulatory Visit | Attending: Orthopedic Surgery

## 2015-06-15 DIAGNOSIS — Z96653 Presence of artificial knee joint, bilateral: Secondary | ICD-10-CM | POA: Diagnosis not present

## 2015-06-15 DIAGNOSIS — Z951 Presence of aortocoronary bypass graft: Secondary | ICD-10-CM | POA: Diagnosis not present

## 2015-06-15 DIAGNOSIS — I10 Essential (primary) hypertension: Secondary | ICD-10-CM | POA: Insufficient documentation

## 2015-06-15 DIAGNOSIS — S76011A Strain of muscle, fascia and tendon of right hip, initial encounter: Secondary | ICD-10-CM | POA: Diagnosis not present

## 2015-06-15 DIAGNOSIS — Z7982 Long term (current) use of aspirin: Secondary | ICD-10-CM | POA: Insufficient documentation

## 2015-06-15 DIAGNOSIS — M7061 Trochanteric bursitis, right hip: Secondary | ICD-10-CM | POA: Diagnosis not present

## 2015-06-15 DIAGNOSIS — E785 Hyperlipidemia, unspecified: Secondary | ICD-10-CM | POA: Diagnosis not present

## 2015-06-15 DIAGNOSIS — H409 Unspecified glaucoma: Secondary | ICD-10-CM | POA: Insufficient documentation

## 2015-06-15 DIAGNOSIS — Z79899 Other long term (current) drug therapy: Secondary | ICD-10-CM | POA: Insufficient documentation

## 2015-06-15 DIAGNOSIS — I252 Old myocardial infarction: Secondary | ICD-10-CM | POA: Diagnosis not present

## 2015-06-15 DIAGNOSIS — M1990 Unspecified osteoarthritis, unspecified site: Secondary | ICD-10-CM | POA: Insufficient documentation

## 2015-06-15 DIAGNOSIS — M25551 Pain in right hip: Secondary | ICD-10-CM | POA: Diagnosis present

## 2015-06-15 DIAGNOSIS — I2510 Atherosclerotic heart disease of native coronary artery without angina pectoris: Secondary | ICD-10-CM | POA: Diagnosis not present

## 2015-06-15 DIAGNOSIS — M7071 Other bursitis of hip, right hip: Secondary | ICD-10-CM | POA: Diagnosis not present

## 2015-06-15 DIAGNOSIS — S76811A Strain of other specified muscles, fascia and tendons at thigh level, right thigh, initial encounter: Secondary | ICD-10-CM | POA: Diagnosis not present

## 2015-06-15 HISTORY — PX: EXCISION/RELEASE BURSA HIP: SHX5014

## 2015-06-15 SURGERY — RELEASE, BURSA, TROCHANTERIC
Anesthesia: General | Site: Hip | Laterality: Right

## 2015-06-15 MED ORDER — BUPIVACAINE LIPOSOME 1.3 % IJ SUSP
INTRAMUSCULAR | Status: DC | PRN
  Administered 2015-06-15: 20 mL

## 2015-06-15 MED ORDER — PROPOFOL 10 MG/ML IV BOLUS
INTRAVENOUS | Status: DC | PRN
  Administered 2015-06-15: 100 mg via INTRAVENOUS

## 2015-06-15 MED ORDER — CEFAZOLIN SODIUM-DEXTROSE 2-3 GM-% IV SOLR
INTRAVENOUS | Status: AC
  Filled 2015-06-15: qty 50

## 2015-06-15 MED ORDER — METOCLOPRAMIDE HCL 10 MG PO TABS: 5.0000 mg | ORAL_TABLET | Freq: Three times a day (TID) | ORAL | Status: DC | PRN

## 2015-06-15 MED ORDER — SODIUM CHLORIDE 0.9 % IV SOLN: INTRAVENOUS | Status: DC

## 2015-06-15 MED ORDER — LIDOCAINE HCL (CARDIAC) 20 MG/ML IV SOLN
INTRAVENOUS | Status: AC
  Filled 2015-06-15: qty 5

## 2015-06-15 MED ORDER — ACETAMINOPHEN 10 MG/ML IV SOLN
INTRAVENOUS | Status: AC
  Filled 2015-06-15: qty 100

## 2015-06-15 MED ORDER — ONDANSETRON HCL 4 MG/2ML IJ SOLN
INTRAMUSCULAR | Status: DC | PRN
  Administered 2015-06-15: 4 mg via INTRAVENOUS

## 2015-06-15 MED ORDER — DEXAMETHASONE SODIUM PHOSPHATE 10 MG/ML IJ SOLN
10.0000 mg | Freq: Once | INTRAMUSCULAR | Status: DC
Start: 2015-06-15 — End: 2015-06-15

## 2015-06-15 MED ORDER — ONDANSETRON HCL 4 MG/2ML IJ SOLN
INTRAMUSCULAR | Status: AC
  Filled 2015-06-15: qty 2

## 2015-06-15 MED ORDER — ASPIRIN EC 81 MG PO TBEC
81.0000 mg | DELAYED_RELEASE_TABLET | Freq: Every day | ORAL | Status: DC
  Filled 2015-06-15: qty 1

## 2015-06-15 MED ORDER — SODIUM CHLORIDE 0.9 % IJ SOLN
INTRAMUSCULAR | Status: AC
  Filled 2015-06-15: qty 50

## 2015-06-15 MED ORDER — FENTANYL CITRATE (PF) 100 MCG/2ML IJ SOLN
25.0000 ug | INTRAMUSCULAR | Status: DC | PRN
  Administered 2015-06-15 (×2): 50 ug via INTRAVENOUS

## 2015-06-15 MED ORDER — ATENOLOL 25 MG PO TABS
25.0000 mg | ORAL_TABLET | Freq: Every day | ORAL | Status: DC
  Administered 2015-06-15: 25 mg via ORAL
  Filled 2015-06-15 (×2): qty 1

## 2015-06-15 MED ORDER — METOCLOPRAMIDE HCL 5 MG/ML IJ SOLN: 5.0000 mg | Freq: Three times a day (TID) | INTRAMUSCULAR | Status: DC | PRN

## 2015-06-15 MED ORDER — PROMETHAZINE HCL 25 MG/ML IJ SOLN
6.2500 mg | INTRAMUSCULAR | Status: DC | PRN
Start: 2015-06-15 — End: 2015-06-15

## 2015-06-15 MED ORDER — LATANOPROST 0.005 % OP SOLN
1.0000 [drp] | Freq: Every day | OPHTHALMIC | Status: DC
  Administered 2015-06-15: 1 [drp] via OPHTHALMIC
  Filled 2015-06-15 (×2): qty 2.5

## 2015-06-15 MED ORDER — ACETAMINOPHEN 650 MG RE SUPP: 650.0000 mg | Freq: Four times a day (QID) | RECTAL | Status: DC | PRN

## 2015-06-15 MED ORDER — CLOPIDOGREL BISULFATE 75 MG PO TABS
75.0000 mg | ORAL_TABLET | Freq: Every day | ORAL | Status: DC
Start: 2015-06-15 — End: 2015-06-16
  Administered 2015-06-15 – 2015-06-16 (×2): 75 mg via ORAL
  Filled 2015-06-15 (×2): qty 1

## 2015-06-15 MED ORDER — ACETAMINOPHEN 500 MG PO TABS
1000.0000 mg | ORAL_TABLET | Freq: Four times a day (QID) | ORAL | Status: DC
  Administered 2015-06-15 – 2015-06-16 (×2): 1000 mg via ORAL
  Filled 2015-06-15 (×3): qty 2

## 2015-06-15 MED ORDER — ACETAMINOPHEN 325 MG PO TABS: 650.0000 mg | ORAL_TABLET | Freq: Four times a day (QID) | ORAL | Status: DC | PRN

## 2015-06-15 MED ORDER — DEXAMETHASONE SODIUM PHOSPHATE 10 MG/ML IJ SOLN
INTRAMUSCULAR | Status: DC | PRN
  Administered 2015-06-15: 10 mg via INTRAVENOUS

## 2015-06-15 MED ORDER — METHOCARBAMOL 1000 MG/10ML IJ SOLN
500.0000 mg | Freq: Four times a day (QID) | INTRAVENOUS | Status: DC | PRN
  Administered 2015-06-15: 500 mg via INTRAVENOUS
  Filled 2015-06-15 (×2): qty 5

## 2015-06-15 MED ORDER — ACETAMINOPHEN 10 MG/ML IV SOLN
1000.0000 mg | Freq: Once | INTRAVENOUS | Status: AC
  Administered 2015-06-15: 1000 mg via INTRAVENOUS

## 2015-06-15 MED ORDER — OXYCODONE HCL 5 MG PO TABS
5.0000 mg | ORAL_TABLET | ORAL | Status: DC | PRN
  Administered 2015-06-15: 10 mg via ORAL
  Administered 2015-06-15 – 2015-06-16 (×3): 5 mg via ORAL
  Filled 2015-06-15 (×3): qty 1
  Filled 2015-06-15: qty 2

## 2015-06-15 MED ORDER — FENTANYL CITRATE (PF) 100 MCG/2ML IJ SOLN
INTRAMUSCULAR | Status: AC
  Filled 2015-06-15: qty 2

## 2015-06-15 MED ORDER — BUPIVACAINE HCL (PF) 0.25 % IJ SOLN
INTRAMUSCULAR | Status: AC
  Filled 2015-06-15: qty 30

## 2015-06-15 MED ORDER — BUPIVACAINE LIPOSOME 1.3 % IJ SUSP
20.0000 mL | Freq: Once | INTRAMUSCULAR | Status: DC
  Filled 2015-06-15: qty 20

## 2015-06-15 MED ORDER — METHOCARBAMOL 500 MG PO TABS: 500.0000 mg | ORAL_TABLET | Freq: Four times a day (QID) | ORAL | Status: DC | PRN

## 2015-06-15 MED ORDER — PHENYLEPHRINE 40 MCG/ML (10ML) SYRINGE FOR IV PUSH (FOR BLOOD PRESSURE SUPPORT)
PREFILLED_SYRINGE | INTRAVENOUS | Status: AC
  Filled 2015-06-15: qty 10

## 2015-06-15 MED ORDER — FENTANYL CITRATE (PF) 100 MCG/2ML IJ SOLN
INTRAMUSCULAR | Status: DC | PRN
  Administered 2015-06-15 (×2): 25 ug via INTRAVENOUS
  Administered 2015-06-15: 50 ug via INTRAVENOUS

## 2015-06-15 MED ORDER — MEPERIDINE HCL 50 MG/ML IJ SOLN: 6.2500 mg | INTRAMUSCULAR | Status: DC | PRN

## 2015-06-15 MED ORDER — ONDANSETRON HCL 4 MG/2ML IJ SOLN
4.0000 mg | Freq: Four times a day (QID) | INTRAMUSCULAR | Status: DC | PRN
  Administered 2015-06-15: 4 mg via INTRAVENOUS
  Filled 2015-06-15: qty 2

## 2015-06-15 MED ORDER — BUPIVACAINE HCL 0.25 % IJ SOLN
INTRAMUSCULAR | Status: DC | PRN
  Administered 2015-06-15: 20 mL

## 2015-06-15 MED ORDER — MORPHINE SULFATE (PF) 2 MG/ML IV SOLN: 1.0000 mg | INTRAVENOUS | Status: DC | PRN

## 2015-06-15 MED ORDER — LACTATED RINGERS IV SOLN
INTRAVENOUS | Status: DC | PRN
  Administered 2015-06-15: 12:00:00 via INTRAVENOUS

## 2015-06-15 MED ORDER — PHENYLEPHRINE HCL 10 MG/ML IJ SOLN
INTRAMUSCULAR | Status: DC | PRN
  Administered 2015-06-15 (×3): 80 ug via INTRAVENOUS

## 2015-06-15 MED ORDER — SUCCINYLCHOLINE CHLORIDE 20 MG/ML IJ SOLN
INTRAMUSCULAR | Status: DC | PRN
  Administered 2015-06-15: 60 mg via INTRAVENOUS

## 2015-06-15 MED ORDER — CEFAZOLIN SODIUM-DEXTROSE 2-3 GM-% IV SOLR
2.0000 g | INTRAVENOUS | Status: AC
  Administered 2015-06-15: 2 g via INTRAVENOUS

## 2015-06-15 MED ORDER — BRIMONIDINE TARTRATE 0.2 % OP SOLN
1.0000 [drp] | Freq: Two times a day (BID) | OPHTHALMIC | Status: DC
  Administered 2015-06-15 – 2015-06-16 (×2): 1 [drp] via OPHTHALMIC
  Filled 2015-06-15 (×2): qty 5

## 2015-06-15 MED ORDER — CHLORHEXIDINE GLUCONATE 4 % EX LIQD: 60.0000 mL | Freq: Once | CUTANEOUS | Status: DC

## 2015-06-15 MED ORDER — LACTATED RINGERS IV SOLN
INTRAVENOUS | Status: DC
  Administered 2015-06-15: 15:00:00 via INTRAVENOUS

## 2015-06-15 MED ORDER — LIDOCAINE HCL (CARDIAC) 20 MG/ML IV SOLN
INTRAVENOUS | Status: DC | PRN
  Administered 2015-06-15: 50 mg via INTRAVENOUS

## 2015-06-15 MED ORDER — SODIUM CHLORIDE 0.9 % IV SOLN
INTRAVENOUS | Status: DC
  Administered 2015-06-15 – 2015-06-16 (×2): via INTRAVENOUS

## 2015-06-15 MED ORDER — ONDANSETRON HCL 4 MG PO TABS: 4.0000 mg | ORAL_TABLET | Freq: Four times a day (QID) | ORAL | Status: DC | PRN

## 2015-06-15 MED ORDER — SODIUM CHLORIDE 0.9 % IJ SOLN
INTRAMUSCULAR | Status: DC | PRN
Start: 2015-06-15 — End: 2015-06-15
  Administered 2015-06-15: 30 mL

## 2015-06-15 MED ORDER — CEFAZOLIN SODIUM-DEXTROSE 2-3 GM-% IV SOLR
2.0000 g | Freq: Four times a day (QID) | INTRAVENOUS | Status: AC
  Administered 2015-06-15 – 2015-06-16 (×3): 2 g via INTRAVENOUS
  Filled 2015-06-15 (×3): qty 50

## 2015-06-15 SURGICAL SUPPLY — 43 items
ANCHOR SUPER QUICK (Anchor) ×6 IMPLANT
BAG ZIPLOCK 12X15 (MISCELLANEOUS) IMPLANT
BIT DRILL 2.4X128 (BIT) IMPLANT
BIT DRILL 2.4X128MM (BIT)
BLADE EXTENDED COATED 6.5IN (ELECTRODE) ×3 IMPLANT
CLOSURE WOUND 1/2 X4 (GAUZE/BANDAGES/DRESSINGS) ×1
DRAPE INCISE IOBAN 66X45 STRL (DRAPES) ×3 IMPLANT
DRAPE ORTHO SPLIT 77X108 STRL (DRAPES) ×4
DRAPE POUCH INSTRU U-SHP 10X18 (DRAPES) ×3 IMPLANT
DRAPE SURG ORHT 6 SPLT 77X108 (DRAPES) ×2 IMPLANT
DRAPE U-SHAPE 47X51 STRL (DRAPES) ×3 IMPLANT
DRSG ADAPTIC 3X8 NADH LF (GAUZE/BANDAGES/DRESSINGS) ×3 IMPLANT
DRSG MEPILEX BORDER 4X4 (GAUZE/BANDAGES/DRESSINGS) ×3 IMPLANT
DRSG MEPILEX BORDER 4X8 (GAUZE/BANDAGES/DRESSINGS) ×3 IMPLANT
DURAPREP 26ML APPLICATOR (WOUND CARE) ×3 IMPLANT
ELECT REM PT RETURN 9FT ADLT (ELECTROSURGICAL) ×3
ELECTRODE REM PT RTRN 9FT ADLT (ELECTROSURGICAL) ×1 IMPLANT
EVACUATOR 1/8 PVC DRAIN (DRAIN) ×3 IMPLANT
GAUZE SPONGE 4X4 12PLY STRL (GAUZE/BANDAGES/DRESSINGS) ×3 IMPLANT
GLOVE BIO SURGEON STRL SZ7.5 (GLOVE) ×3 IMPLANT
GLOVE BIO SURGEON STRL SZ8 (GLOVE) ×3 IMPLANT
GLOVE BIOGEL PI IND STRL 8 (GLOVE) ×2 IMPLANT
GLOVE BIOGEL PI INDICATOR 8 (GLOVE) ×4
GOWN STRL REUS W/TWL LRG LVL3 (GOWN DISPOSABLE) ×3 IMPLANT
GOWN STRL REUS W/TWL XL LVL3 (GOWN DISPOSABLE) ×3 IMPLANT
KIT BASIN OR (CUSTOM PROCEDURE TRAY) ×3 IMPLANT
MANIFOLD NEPTUNE II (INSTRUMENTS) ×3 IMPLANT
NDL SAFETY ECLIPSE 18X1.5 (NEEDLE) ×2 IMPLANT
NEEDLE HYPO 18GX1.5 SHARP (NEEDLE) ×4
NS IRRIG 1000ML POUR BTL (IV SOLUTION) ×3 IMPLANT
PACK TOTAL JOINT (CUSTOM PROCEDURE TRAY) ×3 IMPLANT
PASSER SUT SWANSON 36MM LOOP (INSTRUMENTS) IMPLANT
POSITIONER SURGICAL ARM (MISCELLANEOUS) ×3 IMPLANT
STRIP CLOSURE SKIN 1/2X4 (GAUZE/BANDAGES/DRESSINGS) ×2 IMPLANT
SUT ETHIBOND NAB CT1 #1 30IN (SUTURE) IMPLANT
SUT MNCRL AB 4-0 PS2 18 (SUTURE) ×3 IMPLANT
SUT VIC AB 2-0 CT1 27 (SUTURE) ×4
SUT VIC AB 2-0 CT1 TAPERPNT 27 (SUTURE) ×2 IMPLANT
SUT VLOC 180 0 24IN GS25 (SUTURE) ×3 IMPLANT
SYR 20CC LL (SYRINGE) ×3 IMPLANT
SYR 50ML LL SCALE MARK (SYRINGE) ×3 IMPLANT
TAPE STRIPS DRAPE STRL (GAUZE/BANDAGES/DRESSINGS) ×3 IMPLANT
TOWEL OR 17X26 10 PK STRL BLUE (TOWEL DISPOSABLE) ×3 IMPLANT

## 2015-06-15 NOTE — Anesthesia Procedure Notes (Signed)
Procedure Name: Intubation Date/Time: 06/15/2015 1:10 PM Performed by: Danley Danker L Patient Re-evaluated:Patient Re-evaluated prior to inductionOxygen Delivery Method: Circle system utilized Preoxygenation: Pre-oxygenation with 100% oxygen Intubation Type: IV induction Ventilation: Mask ventilation without difficulty and Oral airway inserted - appropriate to patient size Laryngoscope Size: Glidescope and 3 Grade View: Grade I Tube type: Parker flex tip Tube size: 7.0 mm Number of attempts: 1 Airway Equipment and Method: Stylet Placement Confirmation: ETT inserted through vocal cords under direct vision,  positive ETCO2 and breath sounds checked- equal and bilateral Secured at: 21 cm Tube secured with: Tape Dental Injury: Teeth and Oropharynx as per pre-operative assessment  Difficulty Due To: Difficult Airway- due to anterior larynx

## 2015-06-15 NOTE — Interval H&P Note (Signed)
History and Physical Interval Note:  06/15/2015 12:56 PM  [name]  has presented today for surgery, with the diagnosis of right hip bursitis with gluteal tear  The various methods of treatment have been discussed with the patient and family. After consideration of risks, benefits and other options for treatment, the patient has consented to  Procedure(s): [place] (Right) as a surgical intervention .  The patient's history has been reviewed, patient examined, no change in status, stable for surgery.  I have reviewed the patient's chart and labs.  Questions were answered to the patient's satisfaction.     [name]

## 2015-06-15 NOTE — Anesthesia Preprocedure Evaluation (Signed)
Anesthesia Evaluation  Patient identified by MRN, date of birth, ID band Patient awake    Reviewed: Allergy & Precautions, NPO status , Patient's Chart, lab work & pertinent test results  History of Anesthesia Complications (+) PONV  Airway Mallampati: II  TM Distance: >3 FB Neck ROM: Full    Dental no notable dental hx.    Pulmonary neg pulmonary ROS,    Pulmonary exam normal breath sounds clear to auscultation       Cardiovascular hypertension, + CAD, + Past MI and + CABG (2005)  Normal cardiovascular exam Rhythm:Regular Rate:Normal     Neuro/Psych negative neurological ROS  negative psych ROS   GI/Hepatic negative GI ROS, Neg liver ROS,   Endo/Other  negative endocrine ROS  Renal/GU negative Renal ROS  negative genitourinary   Musculoskeletal negative musculoskeletal ROS (+)   Abdominal   Peds negative pediatric ROS (+)  Hematology negative hematology ROS (+)   Anesthesia Other Findings   Reproductive/Obstetrics negative OB ROS                             Anesthesia Physical Anesthesia Plan  ASA: II  Anesthesia Plan: General   Post-op Pain Management:    Induction: Intravenous  Airway Management Planned: Oral ETT  Additional Equipment:   Intra-op Plan:   Post-operative Plan: Extubation in OR  Informed Consent: I have reviewed the patients History and Physical, chart, labs and discussed the procedure including the risks, benefits and alternatives for the proposed anesthesia with the patient or authorized representative who has indicated his/her understanding and acceptance.   Dental advisory given  Plan Discussed with: CRNA  Anesthesia Plan Comments: (Only off Plavix for 6 days. Not a candidate for SAB.)        Anesthesia Quick Evaluation

## 2015-06-15 NOTE — Op Note (Signed)
NAMESHARAYNE, COUSIN NO.:  [id number]  MEDICAL RECORD NO.:  [id number]  LOCATION:  [id number]                         FACILITY:  [facility]  PHYSICIAN:  Gaynelle Arabian, M.D.    DATE OF BIRTH:  01-24-1932  DATE OF PROCEDURE:  06/15/2015 DATE OF DISCHARGE:                              OPERATIVE REPORT   PREOPERATIVE DIAGNOSIS:  Right hip intractable bursitis with gluteal tendon tear.  POSTOPERATIVE DIAGNOSIS:  Right hip intractable bursitis with gluteal tendon tear.  PROCEDURE:  Right hip bursectomy with gluteal tendon repair.  SURGEON:  Gaynelle Arabian, M.D.  ASSISTANT:  Alexzandrew L. Perkins, P.A.C.  ANESTHESIA:  General.  ESTIMATED BLOOD LOSS:  Minimal.  DRAINS:  Hemovac x1.  COMPLICATIONS:  None.  CONDITION:  Stable to recovery.  BRIEF CLINICAL NOTE:  [name] is an 82 year old female with severe right lateral hip pain for many months.  Then respond to injection and physical therapy.  MRI showed a gluteal tendon tear.  She presents now for bursectomy and gluteal tendon repair.  PROCEDURE IN DETAIL:  After successful administration of general anesthetic, the patient placed in the left lateral decubitus position with the right side up and held with the hip positioner.  Right lower extremity was isolated from her perineum with plastic drapes and prepped and draped in the usual sterile fashion.  Longitudinal incision was made about 4 inches in length, centered over the tip of the greater trochanter.  Skin was cut with 10-blade through the subcutaneous tissue to the fascia lata, which was incised in line with the skin incision. There was a large bursa present, which was not fluid filled.  The bursa was excised.  The greater trochanter was essentially bare with the gluteus medius tendon torn and retracted off the trochanter.  There were some fibers left posteriorly, but about two-thirds of the tendon was torn.  I freshened up a bed on the greater trochanter for  placement of the tendons back into that bed.  Two Mitek anchors were placed; one at the 12 o'clock position and one at the 3 o'clock position.  The sutures were passed through the edges of the tendons and the tendon was then advanced back down to the bone.  We sewed the tendon back to bone and then sewed the tendon edges to each other with the Ethibond sutures.  I felt that this was a very stable repair.  Did require a lot of mobilization of the tendon and assistant was necessary for retraction and to help mobilize the attendance or would advanced back to the greater trochanter.  The wound was then irrigated with saline solution. Meticulous hemostasis was achieved.  A small triangular piece of fascia lata, which overlie the tip of the trochanter was excised so as to prevent rubbing over the bone.  We then closed the superior part of the fascia with a running V-Loc suture.  Inferior to the removed portion, we closed an interrupted Ethibond suture.  A 20 mL of Exparel mixed with 30 mL of saline injected into the gluteal muscles, fascia lata, subcutaneous tissues.  An additional 20 mL of Marcaine was injected into the same tissues.  The subcu was then closed with interrupted  2-0 Vicryl and subcuticular running 4-0 Monocryl.  The incision was cleaned and dried and Steri-Strips and a bulky sterile dressing applied.  Drains hooked to suction and she was awakened and transported to recovery in stable condition.     Gaynelle Arabian, M.D.     FA/MEDQ  D:  06/15/2015  T:  06/15/2015  Job:  [id number]

## 2015-06-15 NOTE — Brief Op Note (Signed)
06/15/2015  1:58 PM  PATIENT:  [name]  82 y.o. female  PRE-OPERATIVE DIAGNOSIS:  right hip bursitis with gluteal tear  POST-OPERATIVE DIAGNOSIS:  right hip bursitis with gluteal tear  PROCEDURE:  Procedure(s): RIGHT HIP BURSECTOMY WITH GLUTEAL TENDON REPAIR (Right)  SURGEON:  Surgeon(s) and Role:    * Gaynelle Arabian, MD - Primary  PHYSICIAN ASSISTANT:   ASSISTANTS: [name], PA-C   ANESTHESIA:   general  EBL:     BLOOD ADMINISTERED:none  DRAINS: (Medium) Hemovact drain(s) in the right hip with  Suction Open   LOCAL MEDICATIONS USED:  OTHER Exparel  COUNTS:  YES  TOURNIQUET:  * No tourniquets in log *  DICTATION: .Other Dictation: Dictation Number [id number]  PLAN OF CARE: Admit for overnight observation  PATIENT DISPOSITION:  PACU - hemodynamically stable.

## 2015-06-15 NOTE — Anesthesia Postprocedure Evaluation (Signed)
Anesthesia Post Note  Patient: [name]  Procedure(s) Performed: Procedure(s) (LRB): RIGHT HIP BURSECTOMY WITH GLUTEAL TENDON REPAIR (Right)  Patient location during evaluation: PACU Anesthesia Type: General Level of consciousness: awake and alert Pain management: pain level controlled Vital Signs Assessment: post-procedure vital signs reviewed and stable Respiratory status: spontaneous breathing, nonlabored ventilation, respiratory function stable and patient connected to nasal cannula oxygen Cardiovascular status: blood pressure returned to baseline and stable Postop Assessment: no signs of nausea or vomiting Anesthetic complications: no    Last Vitals:  Filed Vitals:   06/15/15 1515 06/15/15 1530  BP: 136/75 163/74  Pulse: 76 78  Temp: 36.7 C 36.7 C  Resp: 15 16    Last Pain:  Filed Vitals:   06/15/15 1538  PainSc: 4                  Montez Hageman

## 2015-06-15 NOTE — H&P (Signed)
CC- MACALA PROKOSCH is a 82 y.o. female who presents with right hip pain  Hip Pain: Patient complains of right hip pain. Onset of the symptoms was several months ago. Inciting event: none. Current symptoms include lateral hip pain radiating to groin and down thigh. Associated symptoms: none. Aggravating symptoms: walking and laying on right side. Evaluation to date: MRI with luteal tendon tear.  Treatment to date: injection without benefit.  Past Medical History  Diagnosis Date  . Dyslipidemia     Statin intolerant  . Hypertension   . CAD in native artery   . Non-STEMI (non-ST elevated myocardial infarction) [facility]) October 2005    EF by 35-40%, echo 40-50%. Angiography: 99% mid LAD involving D1 followed by 70% mid LAD; 80% RI. --> CABG  . 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  [name] CAD (coronary artery disease), autologous vein bypass graft March 2008    Follow-up cath: December 2015 Occluded SVG-D1 and occluded SVG-RI.  [name] Statin intolerance   . PONV (postoperative nausea and vomiting)   . Glaucoma   . Arthritis   . Bursitis     hips bilat     Past Surgical History  Procedure Laterality Date  . Transthoracic echocardiogram  02/19/2004; December 2015    a. EF 45-50%, Normal LV function, moderate hypokinesis of anterior wall.;; b. EF 50-55%. No RWMA, GR 1 DD. Normal valves   . Cardiac catheterization  02/19/2004    Significant 2 vessel CAD - LAD, D1 and RI  . Coronary artery bypass graft  02/22/2004    x3. LIMA to distal LAD, SVG to first diag, SVG to ramus. SVG harvest from rt thigh.  . Cardiovascular stress test  05/23/2006    Mild lateral/inferolateral ischemia, likely due to occluded SVGs with exhisting disease.  . Cardiac catheterization  07/25/2006    Totally occluded SVG to diag and ramus. Patent LIMA-LAD. Ramus 70-80% proximal stenosis and occluded vein graft.  . Carotid doppler  07/15/2009    Bilat ICAs 0-49% diameter reduction. Normal patency of Bilat  subclavian arteries.  . Nm myoview ltd  January 2008; December 2015    a. Referred for For mild inferolateral and anterolateral/apical lateral defect with mild reversibility.;; b. Large defect in the anterior and inferior wall. Suggestive of potential infarct plus ischemia. HIGH RISK.  [name] Left heart catheterization with coronary/graft angiogram N/A 04/28/2014    Procedure: LEFT HEART CATHETERIZATION WITH Beatrix Fetters;  Surgeon: Sinclair Grooms, MD;  Location: [facility] C[facility] LAB;  Service: Cardiovascular;  Laterality: N/A;  . Left total knee replacement       2001  . Right total knee replacement       2003  . Bunionectomy      2002  . Eye surgery      cataract surgery bilat     Prior to Admission medications   Medication Sig Start Date End Date Taking? Authorizing Provider  acetaminophen (TYLENOL) 500 MG tablet Take 500 mg by mouth every 4 (four) hours as needed for moderate pain.   Yes Historical Provider, MD  amoxicillin (AMOXIL) 500 MG capsule TAKE 4 CAPSULES BY MOUTH 1 HOUR BEFORE [facility] DENTAL VISIT 05/05/15  Yes Historical Provider, MD  aspirin EC 81 MG tablet Take 81 mg by mouth at bedtime.    Yes Historical Provider, MD  atenolol (TENORMIN) 25 MG tablet Take 1 tablet (25 mg total) by mouth daily. Patient taking differently: Take 25 mg by mouth at bedtime.  05/11/15  Yes Leonie Man, MD  bimatoprost (LUMIGAN) 0.03 % ophthalmic solution Place 1 drop into both eyes at bedtime.   Yes Historical Provider, MD  brimonidine (ALPHAGAN) 0.2 % ophthalmic solution Place 1 drop into both eyes 2 (two) times daily.   Yes Historical Provider, MD  Cholecalciferol (VITAMIN D3) 2000 UNITS capsule Take 2,000 Units by mouth at bedtime.    Yes Historical Provider, MD  lisinopril (PRINIVIL,ZESTRIL) 10 MG tablet TAKE 1 TABLET BY MOUTH EVERY DAY 06/09/15  Yes Leonie Man, MD  metroNIDAZOLE (METROCREAM) 0.75 % cream Apply 1 application topically 2 (two) times daily. 04/17/13  Yes Historical Provider,  MD  triamcinolone cream (KENALOG) 0.1 % Apply 1 application topically daily as needed (rash.).  03/31/13  Yes Historical Provider, MD  clopidogrel (PLAVIX) 75 MG tablet TAKE 1 TABLET BY MOUTH EVERY DAY 06/09/15   Leonie Man, MD    Physical Examination: General appearance - alert, well appearing, and in no distress Mental status - alert, oriented to person, place, and time Chest - clear to auscultation, no wheezes, rales or rhonchi, symmetric air entry Heart - normal rate, regular rhythm, normal S1, S2, no murmurs, rubs, clicks or gallops Abdomen - soft, nontender, nondistended, no masses or organomegaly Neurological - alert, oriented, normal speech, no focal findings or movement disorder noted  A right hip exam was performed. GENERAL: no acute distress SKIN: intact SWELLING: none WARMTH: no warmth TENDERNESS: maximal at greater trochanter ROM: normal STRENGTH: weakness with hip abduction GAIT: antalgic  ASSESSMENT:Right hip bursitis with gluteal tendon tear  Plan Right hip bursectomy with gluteal tendon repair. Discussed procedure, risks and potentia complications and she elects to proceed  Dione Plover. Edmar Blankenburg, MD    06/15/2015, 12:52 PM

## 2015-06-15 NOTE — Transfer of Care (Signed)
Immediate Anesthesia Transfer of Care Note  Patient: [name]  Procedure(s) Performed: Procedure(s): RIGHT HIP BURSECTOMY WITH GLUTEAL TENDON REPAIR (Right)  Patient Location: PACU  Anesthesia Type:General  Level of Consciousness: awake and oriented  Airway & Oxygen Therapy: Patient Spontanous Breathing  Post-op Assessment: Report given to RN and Post -op Vital signs reviewed and stable  Post vital signs: Reviewed and stable  Last Vitals:  Filed Vitals:   06/15/15 1048  BP: 169/69  Pulse: 75  Temp: 36.8 C  Resp: 18    Complications: No apparent anesthesia complications

## 2015-06-16 DIAGNOSIS — E785 Hyperlipidemia, unspecified: Secondary | ICD-10-CM | POA: Diagnosis not present

## 2015-06-16 DIAGNOSIS — I2510 Atherosclerotic heart disease of native coronary artery without angina pectoris: Secondary | ICD-10-CM | POA: Diagnosis not present

## 2015-06-16 DIAGNOSIS — M7061 Trochanteric bursitis, right hip: Secondary | ICD-10-CM | POA: Diagnosis not present

## 2015-06-16 DIAGNOSIS — I252 Old myocardial infarction: Secondary | ICD-10-CM | POA: Diagnosis not present

## 2015-06-16 DIAGNOSIS — I10 Essential (primary) hypertension: Secondary | ICD-10-CM | POA: Diagnosis not present

## 2015-06-16 DIAGNOSIS — S76011A Strain of muscle, fascia and tendon of right hip, initial encounter: Secondary | ICD-10-CM | POA: Diagnosis not present

## 2015-06-16 MED ORDER — OXYCODONE HCL 5 MG PO TABS: 5.0000 mg | ORAL_TABLET | ORAL | Status: DC | PRN

## 2015-06-16 MED ORDER — METHOCARBAMOL 500 MG PO TABS: 500.0000 mg | ORAL_TABLET | Freq: Four times a day (QID) | ORAL | Status: DC | PRN

## 2015-06-16 NOTE — Progress Notes (Signed)
   Subjective: 1 Day Post-Op Procedure(s) (LRB): RIGHT HIP BURSECTOMY WITH GLUTEAL TENDON REPAIR (Right) Patient reports pain as mild.   Patient seen in rounds with Dr. Wynelle Link. Patient is well, and has had no acute complaints or problems Patient is ready to go home today  Objective: Vital signs in last 24 hours: Temp:  [97.6 F (36.4 C)-98.7 F (37.1 C)] 97.9 F (36.6 C) (02/15 1031) Pulse Rate:  [64-86] 64 (02/15 1031) Resp:  [12-17] 16 (02/15 1031) BP: (122-170)/(44-95) 128/52 mmHg (02/15 1031) SpO2:  [93 %-100 %] 98 % (02/15 1031)  Intake/Output from previous day:  Intake/Output Summary (Last 24 hours) at 06/16/15 1112 Last data filed at 06/16/15 0849  Gross per 24 hour  Intake   2785 ml  Output    981 ml  Net   1804 ml    Intake/Output this shift: Total I/O In: 240 [P.O.:240] Out: -   EXAM: General - Patient is Alert, Appropriate and Oriented Extremity - Neurovascular intact Sensation intact distally Dressing - clean, dry Motor Function - intact, moving foot and toes well on exam.   Assessment/Plan: 1 Day Post-Op Procedure(s) (LRB): RIGHT HIP BURSECTOMY WITH GLUTEAL TENDON REPAIR (Right) Procedure(s) (LRB): RIGHT HIP BURSECTOMY WITH GLUTEAL TENDON REPAIR (Right) Past Medical History  Diagnosis Date  . Dyslipidemia     Statin intolerant  . Hypertension   . CAD in native artery   . Non-STEMI (non-ST elevated myocardial infarction) [facility]) October 2005    EF by 35-40%, echo 40-50%. Angiography: 99% mid LAD involving D1 followed by 70% mid LAD; 80% RI. --> CABG  . 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  [name] CAD (coronary artery disease), autologous vein bypass graft March 2008    Follow-up cath: December 2015 Occluded SVG-D1 and occluded SVG-RI.  [name] Statin intolerance   . PONV (postoperative nausea and vomiting)   . Glaucoma   . Arthritis   . Bursitis     hips bilat    Principal Problem:   Greater trochanteric bursitis of right  hip  Estimated body mass index is 22.58 kg/(m^2) as calculated from the following:   Height as of this encounter: 5' 6.5" (1.689 m).   Weight as of this encounter: 64.411 kg (142 lb). Up with therapy Discharge home with home health Diet - Cardiac diet Follow up - in two weeks. Call office for appointment at [phone]. Activity - Weight bearing as tolerated to the surgical leg.  No active abduction of the leg, (no pulling leg out to the side away from the body).  Walker for first several days until comfortable ambulating. May start showering three days following surgery but do not submerge incision under water. Continue to use ice for pain and swelling from surgery.  Baby Aspirin 81 mg daily for three weeks.  Please use walker for the first couple of days until comfortable ambulating.  May start changing dressing tomorrow with dry gauze and tape.  Disposition - Home Condition Upon Discharge - Good D/C Meds - See DC Summary DVT Prophylaxis - Aspirin and Plavix  [name], PA-C Orthopaedic Surgery 06/16/2015, 11:12 AM

## 2015-06-16 NOTE — Evaluation (Signed)
Occupational Therapy Evaluation Patient Details Name: [name] MRN: [id number] DOB: August 21, 1931 Today's Date: 06/16/2015    History of Present Illness Pt is an 82 y/o female s/p Right hip bursectomy with gluteal tendon repair. PMH includes CAD, s/p CABG, HTN, hyperlipidemia, dislypidemia, arthritis and glaucoma.   Clinical Impression   Pt is currently at overall supervision - min guard level for shower stall anterior/posterior transfers performed x2, supervision toilet transfers and LB ADL's. Handouts were issued/reviewed for shower transfers and AE for LB ADL's. She plans to d/c home with her grand daughter whom is a Pharmacist, hospital and lives with her. She will have 4 days of 24/7 assist followed by intermittent supervision. Pt reports no further acute OT needs at this time, will sign off.    Follow Up Recommendations  No OT follow up;Supervision - Intermittent    Equipment Recommendations  Other (comment) (Issued/reviewed handouts Re: AE for LB ADL's and shower transfers)    Recommendations for Other Services       Precautions / Restrictions Precautions Precautions: Fall Restrictions Weight Bearing Restrictions: Yes RLE Weight Bearing: Weight bearing as tolerated      Mobility Bed Mobility Overal bed mobility:  (Pt up in chair upon OT arrival)                Transfers Overall transfer level: Needs assistance Equipment used: Rolling walker (2 wheeled) Transfers: Sit to/from [employer];Anterior-Posterior Transfer Sit to Stand: Supervision Stand pivot transfers: Supervision   Anterior-Posterior transfers: Min guard   General transfer comment: ocassional vc's for safety and sequencing as well as hand placement during transfers    Balance                                            ADL Overall ADL's : Needs assistance/impaired     Grooming: Wash/dry hands;Wash/dry face;Supervision/safety;Standing Grooming Details (indicate cue  type and reason): Pt ambulated into bathroom and performed grooming tasks  standing at sink w/ vc for RW placement only. Upper Body Bathing: Modified independent;Sitting;Set up   Lower Body Bathing: Supervison/ safety;Sitting/lateral leans;Sit to/from stand;Set up   Upper Body Dressing : Modified independent;Sitting   Lower Body Dressing: Supervision/safety;Sit to/from stand Lower Body Dressing Details (indicate cue type and reason): Pt was able to remove sock R LE in sitting while flexing her trunk forward. She will have assistance from her grand daughter for first 4 days PRN then intermittently. Toilet Transfer: Supervision/safety;Ambulation;BSC;RW Toilet Transfer Details (indicate cue type and reason): Pt ambulated into bathroom and onto 3:1 over toilet. No hands on assist required Toileting- Clothing Manipulation and Hygiene: Supervision/safety;Sitting/lateral lean   Tub/ Shower Transfer: Walk-in shower;Min guard;Cueing for sequencing;Cueing for safety;Ambulation;Rolling walker;Anterior/posterior Tub/Shower Transfer Details (indicate cue type and reason): Pt performed shower stall anterior/posterior transfer x2 given vc's for safety and sequencing.  Functional mobility during ADLs: Supervision/safety;Cueing for sequencing;Rolling walker (cues for hand placement) General ADL Comments: Pt was assessed and educated in shower transfers and AE for LB bathing and dressing. Handouts were provided and reviewed. Pt presents with deficits in her ability to perform ADL's and functional mobility transfers related to ADL and self care tasks. She is currently at overall supervision - min guard level for shower stall anterior/posterior transfers, toilet transfers and LB ADL's. She plans to d/c home with her grand daughter whom is a Pharmacist, hospital and  lives with her. She will have  4 days of 24/7 assist followed by intermittent supervision. Pt reports no further acute OT needs at this time, will sign off.      Vision  Wears glasses for reading; glaucoma; No change from baseline   Perception     Praxis      Pertinent Vitals/Pain Pain Assessment: No/denies pain     Hand Dominance Right   Extremity/Trunk Assessment Upper Extremity Assessment Upper Extremity Assessment: Overall WFL for tasks assessed (Pt has noted arthritis affecting bilateral hands/wrists with wrist and finger deviation noted.)   Lower Extremity Assessment Lower Extremity Assessment: Defer to PT evaluation       Communication Communication Communication: No difficulties   Cognition Arousal/Alertness: Awake/alert Behavior During Therapy: WFL for tasks assessed/performed Overall Cognitive Status: Within Functional Limits for tasks assessed                     General Comments       Exercises       Shoulder Instructions      Home Living Family/patient expects to be discharged to:: Private residence Living Arrangements: Other relatives ([place] daughter) Available Help at Discharge: Family;Available PRN/intermittently Type of Home: House Home Access: Stairs to enter [employer] of Steps: 3-4  Entrance Stairs-Rails: Can reach both Home Layout: Two level;Able to live on main level with bedroom/bathroom     Bathroom Shower/Tub: Walk-in shower;Door   [employer] Toilet: Handicapped height Bathroom Accessibility: Yes How Accessible: Accessible via walker Home Equipment: Shower seat;Grab bars - tub/shower;Bedside commode          Prior Functioning/Environment Level of Independence: Independent        Comments: Drives. Grand daughter is a Pharmacist, hospital and lives with pt. She has taken Thurs & Fri off and will be home over weekend as well (4 days 24/7 then intermittent assist available).    OT Diagnosis:     OT Problem List:     OT Treatment/Interventions:      OT Goals(Current goals can be found in the care plan section) Acute Rehab OT Goals Patient Stated Goal: Go home today OT  Goal Formulation: All assessment and education complete, DC therapy  OT Frequency:     Barriers to D/C:            Co-evaluation              End of Session Equipment Utilized During Treatment: Gait belt;Rolling walker  Activity Tolerance: Patient tolerated treatment well Patient left: in chair;with call bell/phone within reach   Time: 1023-1045 OT Time Calculation (min): 22 min Charges:  OT General Charges $OT Visit: 1 Procedure OT Evaluation $OT Eval Low Complexity: 1 Procedure OT Treatments $Self Care/Home Management : 8-22 mins G-Codes: OT G-codes **NOT FOR INPATIENT CLASS** Functional Assessment Tool Used: Clinical judgement Functional Limitation: Self care Self Care Current Status [id number]): At least 1 percent but less than 20 percent impaired, limited or restricted Self Care Goal Status [id number]): 0 percent impaired, limited or restricted Self Care Discharge Status [id number]): At least 1 percent but less than 20 percent impaired, limited or restricted  Almyra Deforest, OTR/L 06/16/2015, 11:08 AM

## 2015-06-16 NOTE — Evaluation (Signed)
Physical Therapy Evaluation Patient Details Name: [name] MRN: [id number] DOB: [birth year] Today's Date: 06/16/2015   History of Present Illness  Pt is an 82 y/o female s/p Right hip bursectomy with gluteal tendon repair. PMH includes CAD, s/p CABG, HTN, hyperlipidemia, dislypidemia, arthritis and glaucoma.  Clinical Impression  Patient evaluated by Physical Therapy with no further acute PT needs identified. All education has been completed and the patient has no further questions.  Pt educated on no active hip abduction precaution and performed mobility well at this time.  Pt feels ready for d/c home today. See below for any follow-up Physical Therapy or equipment needs. PT is signing off. Thank you for this referral.     Follow Up Recommendations Outpatient PT (per MD)    Equipment Recommendations  Rolling walker with 5" wheels    Recommendations for Other Services       Precautions / Restrictions Precautions Precautions: Fall Precaution Comments: NO ACTIVE HIP ABDUCTION Restrictions Weight Bearing Restrictions: Yes RLE Weight Bearing: Weight bearing as tolerated      Mobility  Bed Mobility Overal bed mobility:  (Pt up in chair upon OT arrival)             General bed mobility comments: pt up in recliner on arrival  Transfers Overall transfer level: Needs assistance Equipment used: Rolling walker (2 wheeled) Transfers: Sit to/from Stand Sit to Stand: Supervision Stand pivot transfers: Supervision   Anterior-Posterior transfers: Min guard   General transfer comment: verbal cues for UE placement  Ambulation/Gait Ambulation/Gait assistance: Supervision Ambulation Distance (Feet): 160 Feet Assistive device: Rolling walker (2 wheeled) Gait Pattern/deviations: Step-through pattern;Decreased stride length     General Gait Details: verbal cues for safe use of RW, pt reports pain controlled  Stairs Stairs: Yes Stairs assistance: Min guard Stair  Management: Step to pattern;Forwards;Two rails Number of Stairs: 2 General stair comments: pt educated on sequence and safety, performed well  Wheelchair Mobility    Modified Rankin (Stroke Patients Only)       Balance                                             Pertinent Vitals/Pain Pain Assessment: No/denies pain    Home Living Family/patient expects to be discharged to:: Private residence Living Arrangements: Other relatives ([place] daughter) Available Help at Discharge: Family;Available PRN/intermittently Type of Home: House Home Access: Stairs to enter Entrance Stairs-Rails: Can reach both Entrance Stairs-Number of Steps: 3-4  Home Layout: Two level;Able to live on main level with bedroom/bathroom Home Equipment: Shower seat;Grab bars - tub/shower      Prior Function Level of Independence: Independent         Comments: Drives. Grand daughter is a Pharmacist, hospital and lives with pt. She has taken Thurs & Fri off and will be home over weekend as well (4 days 24/7 then intermittent assist available).     Hand Dominance   Dominant Hand: Right    Extremity/Trunk Assessment   Upper Extremity Assessment: Overall WFL for tasks assessed (Pt has noted arthritis affecting bilateral hands/wrists with wrist and finger deviation noted.)           Lower Extremity Assessment: RLE deficits/detail RLE Deficits / Details: functional hip weakness observed, pt educated to limit abduction especially no active hip abduction       Communication   Communication: No difficulties  Cognition  Arousal/Alertness: Awake/alert Behavior During Therapy: WFL for tasks assessed/performed Overall Cognitive Status: Within Functional Limits for tasks assessed                      General Comments      Exercises        Assessment/Plan    PT Assessment All further PT needs can be met in the next venue of care  PT Diagnosis Difficulty walking   PT Problem List  Decreased strength;Decreased mobility;Decreased knowledge of use of DME;Pain;Decreased knowledge of precautions  PT Treatment Interventions     PT Goals (Current goals can be found in the Care Plan section) Acute Rehab PT Goals Patient Stated Goal: Go home today PT Goal Formulation: All assessment and education complete, DC therapy    Frequency     Barriers to discharge        Co-evaluation               End of Session Equipment Utilized During Treatment: Gait belt Activity Tolerance: Patient tolerated treatment well Patient left: with call bell/phone within reach;in chair Nurse Communication: Mobility status    Functional Assessment Tool Used: clinical judgement Functional Limitation: Mobility: Walking and moving around Mobility: Walking and Moving Around Current Status (Y2233): At least 1 percent but less than 20 percent impaired, limited or restricted Mobility: Walking and Moving Around Goal Status [phone]): At least 1 percent but less than 20 percent impaired, limited or restricted Mobility: Walking and Moving Around Discharge Status [phone]): At least 1 percent but less than 20 percent impaired, limited or restricted    Time: 1058-1109 PT Time Calculation (min) (ACUTE ONLY): 11 min   Charges:   PT Evaluation $PT Eval Low Complexity: 1 Procedure     PT G Codes:   PT G-Codes **NOT FOR INPATIENT CLASS** Functional Assessment Tool Used: clinical judgement Functional Limitation: Mobility: Walking and moving around Mobility: Walking and Moving Around Current Status (Y0511): At least 1 percent but less than 20 percent impaired, limited or restricted Mobility: Walking and Moving Around Goal Status [id number]): At least 1 percent but less than 20 percent impaired, limited or restricted Mobility: Walking and Moving Around Discharge Status [phone]): At least 1 percent but less than 20 percent impaired, limited or restricted    Brantlee Hinde,KATHrine E 06/16/2015, 12:22 PM Carmelia Bake,  PT, DPT 06/16/2015 Pager: [phone]

## 2015-06-16 NOTE — Discharge Instructions (Signed)
Dr. Gaynelle Arabian [facility] [address] [place], [place] [address] [phone]  Bursectomy / Gluteal Tendon Repair Discharge Instructions  HOME CARE INSTRUCTIONS  Remove items at home which could result in a fall. This includes throw rugs or furniture in walking pathways.   ICE to the affected hip every three hours for 30 minutes at a time and then as needed for pain and swelling.  Continue to use ice on the hip for pain and swelling from surgery. You may notice swelling that will progress down to the foot and ankle.  This is normal after surgery.  Elevate the leg when you are not up walking on it.    Continue to use the breathing machine which will help keep your temperature down.  It is common for your temperature to cycle up and down following surgery, especially at night when you are not up moving around and exerting yourself.  The breathing machine keeps your lungs expanded and your temperature down. Sit on high chairs which makes it easier to stand.  Sit on chairs with arms. Use the chair arms to help push yourself up when arising.   No Active Abduction of the leg (No pulling leg out to the side away from the body).  Use your walker for first several days until comfortable ambulating.  DIET You may resume your previous home diet once your are discharged from the hospital.  DRESSING / WOUND CARE / SHOWERING You may shower 3 days after surgery, but keep the wounds dry during showering.  You may use an occlusive plastic wrap (Press'n Seal for example), NO SOAKING/SUBMERGING IN THE BATHTUB.  If the bandage gets wet, change with a clean dry gauze.  If the incision gets wet, pat the wound dry with a clean towel. You may start showering three days following surgery but do not submerge the incision under water.Just pat the incision dry and apply a dry gauze dressing on daily. Change the surgical dressing daily and reapply a dry  dressing each time.  ACTIVITY Walk with your walker as instructed. Use walker as long as suggested by your caregivers. Avoid periods of inactivity such as sitting longer than an hour when not asleep. This helps prevent blood clots.  You may resume a sexual relationship in one month or when given the OK by your doctor.  You may return to work once you are cleared by your doctor.  Do not drive a car for 6 weeks or until released by you surgeon.  Do not drive while taking narcotics.  WEIGHT BEARING Weight bearing as tolerated with assist device (walker, cane, etc) as directed, use it as long as suggested by your surgeon or therapist, typically at least 4-6 weeks.  POSTOPERATIVE CONSTIPATION PROTOCOL Constipation - defined medically as fewer than three stools per week and severe constipation as less than one stool per week.  One of the most common issues patients have following surgery is constipation.  Even if you have a regular bowel pattern at home, your normal regimen is likely to be disrupted due to multiple reasons following surgery.  Combination of anesthesia, postoperative narcotics, change in appetite and fluid intake all can affect your bowels.  In order to avoid complications following surgery, here are some recommendations in order to help you during your recovery period.  Colace (docusate) - Pick up an over-the-counter form of Colace or another stool softener and take twice a day as long as you are requiring  postoperative pain medications.  Take with a full glass of water daily.  If you experience loose stools or diarrhea, hold the colace until you stool forms back up.  If your symptoms do not get better within 1 week or if they get worse, check with your doctor.  Dulcolax (bisacodyl) - Pick up over-the-counter and take as directed by the product packaging as needed to assist with the movement of your bowels.  Take with a full glass of water.  Use this product as needed if not relieved  by Colace only.   MiraLax (polyethylene glycol) - Pick up over-the-counter to have on hand.  MiraLax is a solution that will increase the amount of water in your bowels to assist with bowel movements.  Take as directed and can mix with a glass of water, juice, soda, coffee, or tea.  Take if you go more than two days without a movement. Do not use MiraLax more than once per day. Call your doctor if you are still constipated or irregular after using this medication for 7 days in a row.  If you continue to have problems with postoperative constipation, please contact the office for further assistance and recommendations.  If you experience "the worst abdominal pain ever" or develop nausea or vomiting, please contact the office immediatly for further recommendations for treatment.  ITCHING  If you experience itching with your medications, try taking only a single pain pill, or even half a pain pill at a time.  You can also use Benadryl over the counter for itching or also to help with sleep.   TED HOSE STOCKINGS Wear the elastic stockings on both legs for three weeks following surgery during the day but you may remove then at night for sleeping.  MEDICATIONS See your medication summary on the After Visit Summary that the nursing staff will review with you prior to discharge.  You may have some home medications which will be placed on hold until you complete the course of blood thinner medication.  It is important for you to complete the blood thinner medication as prescribed by your surgeon.  Continue your approved medications as instructed at time of discharge.  PRECAUTIONS If you experience chest pain or shortness of breath - call 911 immediately for transfer to the hospital emergency department.  If you develop a fever greater that 101 F, purulent drainage from wound, increased redness or drainage from wound, foul odor from the wound/dressing, or calf pain - CONTACT YOUR SURGEON.                                                    FOLLOW-UP APPOINTMENTS Make sure you keep all of your appointments after your operation with your surgeon and caregivers. You should call the office at the above phone number and make an appointment for approximately two weeks after the date of your surgery or on the date instructed by your surgeon outlined in the "After Visit Summary".   Pick up stool softner and laxative for home use following surgery while on pain medications. Do not submerge incision under water. Please use good hand washing techniques while changing dressing each day. May shower starting three days after surgery. Please use a clean towel to pat the incision dry following showers. Continue to use ice for pain and swelling after surgery. Do not use  any lotions or creams on the incision until instructed by your surgeon.  Resume the Plavix and Aspirin at home.

## 2015-06-16 NOTE — Discharge Summary (Signed)
Physician Discharge Summary   Patient ID: [name] MRN: [id number] DOB/AGE: [birth year] 82 y.o.  Admit date: 06/15/2015 Discharge date: 06/16/2015  Primary Diagnosis:  Right hip intractable bursitis with gluteal tendon tear.  Admission Diagnoses:  Past Medical History  Diagnosis Date  . Dyslipidemia     Statin intolerant  . Hypertension   . CAD in native artery   . Non-STEMI (non-ST elevated myocardial infarction) [facility]) October 2005    EF by 35-40%, echo 40-50%. Angiography: 99% mid LAD involving D1 followed by 70% mid LAD; 80% RI. --> CABG  . 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  [name] CAD (coronary artery disease), autologous vein bypass graft March 2008    Follow-up cath: December 2015 Occluded SVG-D1 and occluded SVG-RI.  [name] Statin intolerance   . PONV (postoperative nausea and vomiting)   . Glaucoma   . Arthritis   . Bursitis     hips bilat    Discharge Diagnoses:   Principal Problem:   Greater trochanteric bursitis of right hip  Estimated body mass index is 22.58 kg/(m^2) as calculated from the following:   Height as of this encounter: 5' 6.5" (1.689 m).   Weight as of this encounter: 64.411 kg (142 lb).  Procedure(s) (LRB): RIGHT HIP BURSECTOMY WITH GLUTEAL TENDON REPAIR (Right)   Consults: None  HPI: [name] is an 82 year old female with severe right lateral hip pain for many months. Then respond to injection and physical therapy. MRI showed a gluteal tendon tear. She presents now for bursectomy and gluteal tendon repair.  Laboratory Data: Hospital Outpatient Visit on 06/08/2015  Component Date Value Ref Range Status  . Sodium 06/08/2015 145  135 - 145 mmol/L Final  . Potassium 06/08/2015 4.0  3.5 - 5.1 mmol/L Final  . Chloride 06/08/2015 112* 101 - 111 mmol/L Final  . CO2 06/08/2015 24  22 - 32 mmol/L Final  . Glucose, Bld 06/08/2015 104* 65 - 99 mg/dL Final  . BUN 06/08/2015 19  6 - 20 mg/dL Final  . Creatinine, Ser 06/08/2015  0.82  0.44 - 1.00 mg/dL Final  . Calcium 06/08/2015 10.3  8.9 - 10.3 mg/dL Final  . GFR calc non Af Amer 06/08/2015 >60  >60 mL/min Final  . GFR calc Af Amer 06/08/2015 >60  >60 mL/min Final   Comment: (NOTE) The eGFR has been calculated using the CKD EPI equation. This calculation has not been validated in all clinical situations. eGFR's persistently <60 mL/min signify possible Chronic Kidney Disease.   . Anion gap 06/08/2015 9  5 - 15 Final  . WBC 06/08/2015 5.2  4.0 - 10.5 K/uL Final  . RBC 06/08/2015 4.41  3.87 - 5.11 MIL/uL Final  . Hemoglobin 06/08/2015 13.8  12.0 - 15.0 g/dL Final  . HCT 06/08/2015 41.4  36.0 - 46.0 % Final  . MCV 06/08/2015 93.9  78.0 - 100.0 fL Final  . MCH 06/08/2015 31.3  26.0 - 34.0 pg Final  . MCHC 06/08/2015 33.3  30.0 - 36.0 g/dL Final  . RDW 06/08/2015 12.6  11.5 - 15.5 % Final  . Platelets 06/08/2015 266  150 - 400 K/uL Final     X-Rays:No results found.  EKG: Orders placed or performed in visit on 05/11/15  . EKG 12-Lead     Hospital Course: Patient was admitted to [facility] and taken to the OR and underwent the above state procedure without complications.  Patient tolerated the procedure well and was later  transferred to the recovery room and then to the orthopaedic floor for postoperative care.  They were given PO and IV analgesics for pain control following their surgery.  They were given 24 hours of postoperative antibiotics of  Anti-infectives    Start     Dose/Rate Route Frequency Ordered Stop   06/15/15 2000  ceFAZolin (ANCEF) IVPB 2 g/50 mL premix     2 g 100 mL/hr over 30 Minutes Intravenous Every 6 hours 06/15/15 1549 06/16/15 0819   06/15/15 1050  ceFAZolin (ANCEF) IVPB 2 g/50 mL premix     2 g 100 mL/hr over 30 Minutes Intravenous On call to O.R. 06/15/15 1050 06/15/15 1315     and started on DVT prophylaxis in the form of Aspirin and Plavix. Patient was encouraged to get up and ambulate the day after surgery.  The  patient was allowed to be WBAT with therapy. Discharge planning was consulted to help with postop disposition and equipment needs.  Patient had a good night on the evening of surgery. She was sitting up int he chair on morning rounds. They started to get up OOB with therapy on day one.   Patient was seen in rounds and was ready to go home following morning ambulation.  Diet - Cardiac diet Follow up - in two weeks. Call office for appointment at [phone]. Activity - Weight bearing as tolerated to the surgical leg.  No active abduction of the leg, no pulling leg out to the side away from the body.  Walker for first several days until comfortable ambulating. May start showering three days following surgery but do not submerge incision under water. Continue to use ice for pain and swelling from surgery.  Baby Aspirin 81 mg daily for three weeks.  Please use walker for the first couple of days until comfortable ambulating.  May start changing dressing tomorrow with dry gauze and tape.  Disposition - Home Condition Upon Discharge - Good D/C Meds - See DC Summary DVT Prophylaxis - Aspirin and Plavix       Discharge Instructions    Call MD / Call 911    Complete by:  As directed   If you experience chest pain or shortness of breath, CALL 911 and be transported to the hospital emergency room.  If you develope a fever above 101 F, pus (white drainage) or increased drainage or redness at the wound, or calf pain, call your surgeon's office.     Change dressing    Complete by:  As directed   You may change your dressing dressing daily with sterile 4 x 4 inch gauze dressing and paper tape.  Do not submerge the incision under water.     Constipation Prevention    Complete by:  As directed   Drink plenty of fluids.  Prune juice may be helpful.  You may use a stool softener, such as Colace (over the counter) 100 mg twice a day.  Use MiraLax (over the counter) for constipation as needed.     Diet - low  sodium heart healthy    Complete by:  As directed      Discharge instructions    Complete by:  As directed   Pick up stool softner and laxative for home use following surgery while on pain medications. Do not submerge incision under water. Please use good hand washing techniques while changing dressing each day. May shower starting three days after surgery. Please use a clean towel to pat the incision dry  following showers. Continue to use ice for pain and swelling after surgery. Do not use any lotions or creams on the incision until instructed by your surgeon.  NO ACTIVE ABDUCTION OF RIGHT LEG  Resume the Plavix and Aspirin at home.     Do not sit on low chairs, stoools or toilet seats, as it may be difficult to get up from low surfaces    Complete by:  As directed      Driving restrictions    Complete by:  As directed   No driving until released by the physician.     Increase activity slowly as tolerated    Complete by:  As directed      Lifting restrictions    Complete by:  As directed   No lifting until released by the physician.     Patient may shower    Complete by:  As directed   You may shower without a dressing once there is no drainage.  Do not wash over the wound.  If drainage remains, do not shower until drainage stops.     TED hose    Complete by:  As directed   Use stockings (TED hose) for 3 weeks on both leg(s).  You may remove them at night for sleeping.     Weight bearing as tolerated    Complete by:  As directed   Laterality:  right  Extremity:  Lower            Medication List    STOP taking these medications        amoxicillin 500 MG capsule  Commonly known as:  AMOXIL      TAKE these medications        acetaminophen 500 MG tablet  Commonly known as:  TYLENOL  Take 500 mg by mouth every 4 (four) hours as needed for moderate pain.     aspirin EC 81 MG tablet  Take 81 mg by mouth at bedtime.     atenolol 25 MG tablet  Commonly known as:   TENORMIN  Take 1 tablet (25 mg total) by mouth daily.     bimatoprost 0.03 % ophthalmic solution  Commonly known as:  LUMIGAN  Place 1 drop into both eyes at bedtime.     brimonidine 0.2 % ophthalmic solution  Commonly known as:  ALPHAGAN  Place 1 drop into both eyes 2 (two) times daily.     clopidogrel 75 MG tablet  Commonly known as:  PLAVIX  TAKE 1 TABLET BY MOUTH EVERY DAY     lisinopril 10 MG tablet  Commonly known as:  PRINIVIL,ZESTRIL  TAKE 1 TABLET BY MOUTH EVERY DAY     methocarbamol 500 MG tablet  Commonly known as:  ROBAXIN  Take 1 tablet (500 mg total) by mouth every 6 (six) hours as needed for muscle spasms.     metroNIDAZOLE 0.75 % cream  Commonly known as:  METROCREAM  Apply 1 application topically 2 (two) times daily.     oxyCODONE 5 MG immediate release tablet  Commonly known as:  Oxy IR/ROXICODONE  Take 1-2 tablets (5-10 mg total) by mouth every 3 (three) hours as needed for moderate pain or severe pain.     triamcinolone cream 0.1 %  Commonly known as:  KENALOG  Apply 1 application topically daily as needed (rash.).     Vitamin D3 2000 units capsule  Take 2,000 Units by mouth at bedtime.       Follow-up Information  Follow up with [name], MD. Schedule an appointment as soon as possible for a visit on 06/29/2015.   Specialty:  Orthopedic Surgery   Why:  Call office at [phone] to setup appointment on Tuesday 2/28 with Dr. Wynelle Link.   Contact information:   [address] [place] [address] [phone]       Follow up with [facility].   Why:  rolling walker   Contact information:   [place] [address] [phone]       Signed: [name], PA-C Orthopaedic Surgery 06/17/2015, 9:06 AM

## 2015-06-16 NOTE — Care Management Note (Signed)
Case Management Note  Patient Details  Name: [name] MRN: [id number] Date of Birth: 05/16/1931  Subjective/Objective:                  Right hip bursectomy with gluteal tendon repair Action/Plan: Discharge planning Expected Discharge Date:  06/16/15               Expected Discharge Plan:  Home/Self Care  In-House Referral:     Discharge planning Services  CM Consult  Post Acute Care Choice:    Choice offered to:  Patient  DME Arranged:  [facility] rolling DME Agency:  [employer] Arranged:  [facility] [place] Agency:     Status of Service:  Completed, signed off  Medicare Important Message Given:    Date Medicare IM Given:    Medicare IM give by:    Date Additional Medicare IM Given:    Additional Medicare Important Message give by:     If discussed at [place] of Stay Meetings, dates discussed:    Additional Comments: Cm notes no HH services recc or ordered.  CM called [facility] DME rep, Lecretia to please deliver the rolling walker to room prior to discharge.  No other CM needs were communicated. Dellie Catholic, RN 06/16/2015, 1:12 PM

## 2015-06-29 DIAGNOSIS — Z4789 Encounter for other orthopedic aftercare: Secondary | ICD-10-CM | POA: Diagnosis not present

## 2015-07-15 DIAGNOSIS — E7800 Pure hypercholesterolemia, unspecified: Secondary | ICD-10-CM | POA: Diagnosis not present

## 2015-07-15 DIAGNOSIS — I10 Essential (primary) hypertension: Secondary | ICD-10-CM | POA: Diagnosis not present

## 2015-07-15 DIAGNOSIS — I2510 Atherosclerotic heart disease of native coronary artery without angina pectoris: Secondary | ICD-10-CM | POA: Diagnosis not present

## 2015-07-27 DIAGNOSIS — M7062 Trochanteric bursitis, left hip: Secondary | ICD-10-CM | POA: Diagnosis not present

## 2015-07-27 DIAGNOSIS — Z4789 Encounter for other orthopedic aftercare: Secondary | ICD-10-CM | POA: Diagnosis not present

## 2015-07-27 DIAGNOSIS — M7061 Trochanteric bursitis, right hip: Secondary | ICD-10-CM | POA: Diagnosis not present

## 2015-07-27 DIAGNOSIS — Z9889 Other specified postprocedural states: Secondary | ICD-10-CM | POA: Diagnosis not present

## 2015-08-04 ENCOUNTER — Encounter: Payer: Self-pay | Admitting: Podiatry

## 2015-08-04 ENCOUNTER — Ambulatory Visit (INDEPENDENT_AMBULATORY_CARE_PROVIDER_SITE_OTHER): Payer: Medicare Other | Admitting: Podiatry

## 2015-08-04 DIAGNOSIS — Q828 Other specified congenital malformations of skin: Secondary | ICD-10-CM

## 2015-08-04 NOTE — Progress Notes (Signed)
Subjective:     Patient ID: [name], female   DOB: 05/02/1931, 82 y.o.   MRN: [id number]  HPIThis patient presentsa to the office for painful calluses under the ball of both feet.  She says calluses are painful walking and wearing shoes,.She presents for preventive footcare services.   Review of Systems     Objective:   Physical Exam GENERAL APPEARANCE: Alert, conversant. Appropriately groomed. No acute distress.  VASCULAR: Pedal pulses palpable at  DP and PT bilateral.  Capillary refill time is immediate to all digits,  Normal temperature gradient.  Digital hair growth is present bilateral  NEUROLOGIC: sensation is normal to 5.07 monofilament at 5/5 sites bilateral.  Light touch is intact bilateral, Muscle strength normal.  MUSCULOSKELETAL: acceptable muscle strength, tone and stability bilateral.  Intrinsic muscluature intact bilateral.  HAV 1st MPJ B/L.  Hammer toes 2-5 B/L.  DERMATOLOGIC: skin color, texture, and turgor are within normal limits.  No preulcerative lesions or ulcers  are seen, no interdigital maceration noted.  No open lesions present.  Digital nails are asymptomatic. No drainage noted. Porokeratosis noted sub 1 B/L.      Assessment:     Porokeratosis B/L     Plan:     Debride porokeratosis RTC 3 months   Caila Cirelli DPM       

## 2015-08-16 DIAGNOSIS — E7800 Pure hypercholesterolemia, unspecified: Secondary | ICD-10-CM | POA: Diagnosis not present

## 2015-08-16 DIAGNOSIS — I10 Essential (primary) hypertension: Secondary | ICD-10-CM | POA: Diagnosis not present

## 2015-08-19 DIAGNOSIS — M25551 Pain in right hip: Secondary | ICD-10-CM | POA: Diagnosis not present

## 2015-08-19 DIAGNOSIS — G8929 Other chronic pain: Secondary | ICD-10-CM | POA: Diagnosis not present

## 2015-08-23 DIAGNOSIS — G8929 Other chronic pain: Secondary | ICD-10-CM | POA: Diagnosis not present

## 2015-08-23 DIAGNOSIS — M25551 Pain in right hip: Secondary | ICD-10-CM | POA: Diagnosis not present

## 2015-08-31 DIAGNOSIS — G8929 Other chronic pain: Secondary | ICD-10-CM | POA: Diagnosis not present

## 2015-08-31 DIAGNOSIS — Z4789 Encounter for other orthopedic aftercare: Secondary | ICD-10-CM | POA: Diagnosis not present

## 2015-08-31 DIAGNOSIS — M25551 Pain in right hip: Secondary | ICD-10-CM | POA: Diagnosis not present

## 2015-09-06 DIAGNOSIS — M25551 Pain in right hip: Secondary | ICD-10-CM | POA: Diagnosis not present

## 2015-09-06 DIAGNOSIS — G8929 Other chronic pain: Secondary | ICD-10-CM | POA: Diagnosis not present

## 2015-09-07 DIAGNOSIS — H401123 Primary open-angle glaucoma, left eye, severe stage: Secondary | ICD-10-CM | POA: Diagnosis not present

## 2015-09-07 DIAGNOSIS — H401113 Primary open-angle glaucoma, right eye, severe stage: Secondary | ICD-10-CM | POA: Diagnosis not present

## 2015-09-10 DIAGNOSIS — G8929 Other chronic pain: Secondary | ICD-10-CM | POA: Diagnosis not present

## 2015-09-10 DIAGNOSIS — M25551 Pain in right hip: Secondary | ICD-10-CM | POA: Diagnosis not present

## 2015-09-13 DIAGNOSIS — M25551 Pain in right hip: Secondary | ICD-10-CM | POA: Diagnosis not present

## 2015-09-13 DIAGNOSIS — G8929 Other chronic pain: Secondary | ICD-10-CM | POA: Diagnosis not present

## 2015-09-28 DIAGNOSIS — Z4789 Encounter for other orthopedic aftercare: Secondary | ICD-10-CM | POA: Diagnosis not present

## 2015-09-28 DIAGNOSIS — M7061 Trochanteric bursitis, right hip: Secondary | ICD-10-CM | POA: Diagnosis not present

## 2015-10-28 ENCOUNTER — Encounter: Payer: Self-pay | Admitting: Podiatry

## 2015-10-28 ENCOUNTER — Ambulatory Visit (INDEPENDENT_AMBULATORY_CARE_PROVIDER_SITE_OTHER): Payer: Medicare Other | Admitting: Podiatry

## 2015-10-28 DIAGNOSIS — Q828 Other specified congenital malformations of skin: Secondary | ICD-10-CM

## 2015-10-28 NOTE — Progress Notes (Signed)
Subjective:     Patient ID: [name], female   DOB: 11/22/1931, 82 y.o.   MRN: [id number]  HPIThis patient presentsa to the office for painful calluses under the ball of both feet.  She says calluses are painful walking and wearing shoes,.She presents for preventive footcare services.   Review of Systems     Objective:   Physical Exam GENERAL APPEARANCE: Alert, conversant. Appropriately groomed. No acute distress.  VASCULAR: Pedal pulses palpable at  [facility] and PT bilateral.  Capillary refill time is immediate to all digits,  Normal temperature gradient.  Digital hair growth is present bilateral  NEUROLOGIC: sensation is normal to 5.07 monofilament at 5/5 sites bilateral.  Light touch is intact bilateral, Muscle strength normal.  MUSCULOSKELETAL: acceptable muscle strength, tone and stability bilateral.  Intrinsic muscluature intact bilateral.  HAV 1st MPJ B/L.  Hammer toes 2-5 B/L.  DERMATOLOGIC: skin color, texture, and turgor are within normal limits.  No preulcerative lesions or ulcers  are seen, no interdigital maceration noted.  No open lesions present.  Digital nails are asymptomatic. No drainage noted. Porokeratosis noted sub 1 B/L.      Assessment:     Porokeratosis B/L     Plan:     Debride porokeratosis RTC 3 months   Gardiner Barefoot DPM

## 2015-11-01 DIAGNOSIS — S01119A Laceration without foreign body of unspecified eyelid and periocular area, initial encounter: Secondary | ICD-10-CM | POA: Diagnosis not present

## 2015-11-25 DIAGNOSIS — Z4789 Encounter for other orthopedic aftercare: Secondary | ICD-10-CM | POA: Diagnosis not present

## 2015-12-06 DIAGNOSIS — M5136 Other intervertebral disc degeneration, lumbar region: Secondary | ICD-10-CM | POA: Diagnosis not present

## 2015-12-21 ENCOUNTER — Telehealth: Payer: Self-pay | Admitting: Cardiology

## 2015-12-21 NOTE — Telephone Encounter (Signed)
1. Type of surgery: injection 2. Date of surgery: January 12, 2016 3. Surgeon: [place] 4. Medications that need to be held & how long: Plavix - 5 days prior 5. Fax and/or Phone: (p) [phone] ext: [phone]  [phone]

## 2015-12-24 ENCOUNTER — Telehealth: Payer: Self-pay | Admitting: Cardiology

## 2015-12-24 NOTE — Telephone Encounter (Signed)
Returned call and notified patient of clearance, and instructions for stopping and restarting Plavix w/ loading dose.  Pt voiced understanding and thanks.

## 2015-12-24 NOTE — Telephone Encounter (Signed)
LMTCB  Clearance routed via EPIC to [facility]

## 2015-12-24 NOTE — Telephone Encounter (Signed)
OK to hold Plavix x 5-7 days.   Restart when safe post-procedure - take 300 mg on 1st day of re-start  Glenetta Hew, MD

## 2015-12-24 NOTE — Telephone Encounter (Signed)
New message ° ° ° ° °Pt returning nurse call. Please call.  °

## 2015-12-29 DIAGNOSIS — I10 Essential (primary) hypertension: Secondary | ICD-10-CM | POA: Diagnosis not present

## 2016-01-05 DIAGNOSIS — H401123 Primary open-angle glaucoma, left eye, severe stage: Secondary | ICD-10-CM | POA: Diagnosis not present

## 2016-01-05 DIAGNOSIS — H401113 Primary open-angle glaucoma, right eye, severe stage: Secondary | ICD-10-CM | POA: Diagnosis not present

## 2016-01-06 ENCOUNTER — Ambulatory Visit (INDEPENDENT_AMBULATORY_CARE_PROVIDER_SITE_OTHER): Payer: Medicare Other | Admitting: Podiatry

## 2016-01-06 ENCOUNTER — Encounter: Payer: Self-pay | Admitting: Podiatry

## 2016-01-06 DIAGNOSIS — Q828 Other specified congenital malformations of skin: Secondary | ICD-10-CM | POA: Diagnosis not present

## 2016-01-06 DIAGNOSIS — L84 Corns and callosities: Secondary | ICD-10-CM | POA: Diagnosis not present

## 2016-01-06 NOTE — Progress Notes (Signed)
Subjective:     Patient ID: [name], female   DOB: [birth year], 82 y.o.   MRN: [id number]  HPIThis patient presentsa to the office for painful calluses under the ball of both feet.  She says calluses are painful walking and wearing shoes,.She presents for preventive footcare services.   Review of Systems     Objective:   Physical Exam GENERAL APPEARANCE: Alert, conversant. Appropriately groomed. No acute distress.  VASCULAR: Pedal pulses palpable at  [facility] and PT bilateral.  Capillary refill time is immediate to all digits,  Normal temperature gradient.  Digital hair growth is present bilateral  NEUROLOGIC: sensation is normal to 5.07 monofilament at 5/5 sites bilateral.  Light touch is intact bilateral, Muscle strength normal.  MUSCULOSKELETAL: acceptable muscle strength, tone and stability bilateral.  Intrinsic muscluature intact bilateral.  HAV 1st MPJ B/L.  Hammer toes 2-5 B/L.  DERMATOLOGIC: skin color, texture, and turgor are within normal limits.  No preulcerative lesions or ulcers  are seen, no interdigital maceration noted.  No open lesions present.  Digital nails are asymptomatic. No drainage noted. Porokeratosis noted sub 1 B/L.      Assessment:     Porokeratosis B/L     Plan:     Debride porokeratosis RTC 3 months   Gardiner Barefoot DPM

## 2016-01-12 DIAGNOSIS — M5136 Other intervertebral disc degeneration, lumbar region: Secondary | ICD-10-CM | POA: Diagnosis not present

## 2016-01-21 ENCOUNTER — Observation Stay (HOSPITAL_COMMUNITY)
Admission: EM | Admit: 2016-01-21 | Discharge: 2016-01-22 | Disposition: A | Discharge status: Home / Self Care | Payer: Medicare Other | Attending: Internal Medicine | Admitting: Internal Medicine

## 2016-01-21 ENCOUNTER — Emergency Department (HOSPITAL_COMMUNITY): Payer: Medicare Other

## 2016-01-21 ENCOUNTER — Encounter (HOSPITAL_COMMUNITY): Payer: Self-pay | Admitting: Emergency Medicine

## 2016-01-21 ENCOUNTER — Observation Stay (HOSPITAL_COMMUNITY): Payer: Medicare Other

## 2016-01-21 DIAGNOSIS — Z8673 Personal history of transient ischemic attack (TIA), and cerebral infarction without residual deficits: Secondary | ICD-10-CM | POA: Diagnosis not present

## 2016-01-21 DIAGNOSIS — I10 Essential (primary) hypertension: Secondary | ICD-10-CM | POA: Diagnosis not present

## 2016-01-21 DIAGNOSIS — H409 Unspecified glaucoma: Secondary | ICD-10-CM | POA: Diagnosis not present

## 2016-01-21 DIAGNOSIS — E870 Hyperosmolality and hypernatremia: Secondary | ICD-10-CM | POA: Insufficient documentation

## 2016-01-21 DIAGNOSIS — I671 Cerebral aneurysm, nonruptured: Secondary | ICD-10-CM | POA: Insufficient documentation

## 2016-01-21 DIAGNOSIS — I252 Old myocardial infarction: Secondary | ICD-10-CM | POA: Diagnosis not present

## 2016-01-21 DIAGNOSIS — Z951 Presence of aortocoronary bypass graft: Secondary | ICD-10-CM

## 2016-01-21 DIAGNOSIS — Z7902 Long term (current) use of antithrombotics/antiplatelets: Secondary | ICD-10-CM | POA: Diagnosis not present

## 2016-01-21 DIAGNOSIS — E785 Hyperlipidemia, unspecified: Secondary | ICD-10-CM | POA: Diagnosis not present

## 2016-01-21 DIAGNOSIS — R29818 Other symptoms and signs involving the nervous system: Secondary | ICD-10-CM | POA: Diagnosis not present

## 2016-01-21 DIAGNOSIS — Z66 Do not resuscitate: Secondary | ICD-10-CM | POA: Diagnosis not present

## 2016-01-21 DIAGNOSIS — Z8349 Family history of other endocrine, nutritional and metabolic diseases: Secondary | ICD-10-CM | POA: Insufficient documentation

## 2016-01-21 DIAGNOSIS — Z79899 Other long term (current) drug therapy: Secondary | ICD-10-CM | POA: Insufficient documentation

## 2016-01-21 DIAGNOSIS — Z96653 Presence of artificial knee joint, bilateral: Secondary | ICD-10-CM | POA: Insufficient documentation

## 2016-01-21 DIAGNOSIS — Z7982 Long term (current) use of aspirin: Secondary | ICD-10-CM | POA: Diagnosis not present

## 2016-01-21 DIAGNOSIS — R531 Weakness: Secondary | ICD-10-CM | POA: Diagnosis not present

## 2016-01-21 DIAGNOSIS — Z8249 Family history of ischemic heart disease and other diseases of the circulatory system: Secondary | ICD-10-CM | POA: Insufficient documentation

## 2016-01-21 DIAGNOSIS — I2510 Atherosclerotic heart disease of native coronary artery without angina pectoris: Secondary | ICD-10-CM | POA: Insufficient documentation

## 2016-01-21 DIAGNOSIS — Z9842 Cataract extraction status, left eye: Secondary | ICD-10-CM | POA: Insufficient documentation

## 2016-01-21 DIAGNOSIS — M1990 Unspecified osteoarthritis, unspecified site: Secondary | ICD-10-CM | POA: Diagnosis not present

## 2016-01-21 DIAGNOSIS — Z820 Family history of epilepsy and other diseases of the nervous system: Secondary | ICD-10-CM | POA: Insufficient documentation

## 2016-01-21 DIAGNOSIS — I639 Cerebral infarction, unspecified: Secondary | ICD-10-CM | POA: Diagnosis not present

## 2016-01-21 DIAGNOSIS — Z8379 Family history of other diseases of the digestive system: Secondary | ICD-10-CM | POA: Insufficient documentation

## 2016-01-21 DIAGNOSIS — I25810 Atherosclerosis of coronary artery bypass graft(s) without angina pectoris: Secondary | ICD-10-CM

## 2016-01-21 DIAGNOSIS — Z9889 Other specified postprocedural states: Secondary | ICD-10-CM | POA: Diagnosis not present

## 2016-01-21 DIAGNOSIS — Z9841 Cataract extraction status, right eye: Secondary | ICD-10-CM | POA: Insufficient documentation

## 2016-01-21 DIAGNOSIS — E876 Hypokalemia: Secondary | ICD-10-CM | POA: Diagnosis not present

## 2016-01-21 DIAGNOSIS — R4701 Aphasia: Secondary | ICD-10-CM | POA: Diagnosis not present

## 2016-01-21 DIAGNOSIS — Z789 Other specified health status: Secondary | ICD-10-CM

## 2016-01-21 LAB — COMPREHENSIVE METABOLIC PANEL
ALK PHOS: 74 U/L (ref 38–126)
ALT: 14 U/L (ref 14–54)
AST: 18 U/L (ref 15–41)
Albumin: 4.4 g/dL (ref 3.5–5.0)
Anion gap: 7 (ref 5–15)
BUN: 12 mg/dL (ref 6–20)
CALCIUM: 10 mg/dL (ref 8.9–10.3)
CO2: 28 mmol/L (ref 22–32)
CREATININE: 0.85 mg/dL (ref 0.44–1.00)
Chloride: 109 mmol/L (ref 101–111)
Glucose, Bld: 115 mg/dL — ABNORMAL HIGH (ref 65–99)
Potassium: 3 mmol/L — ABNORMAL LOW (ref 3.5–5.1)
SODIUM: 144 mmol/L (ref 135–145)
Total Bilirubin: 0.6 mg/dL (ref 0.3–1.2)
Total Protein: 7.2 g/dL (ref 6.5–8.1)

## 2016-01-21 LAB — CREATININE, SERUM
Creatinine, Ser: 0.8 mg/dL (ref 0.44–1.00)
GFR calc Af Amer: >60 mL/min (ref >60)
GFR calc non Af Amer: >60 mL/min (ref >60)

## 2016-01-21 LAB — CBC
HCT: 40.4 % (ref 36.0–46.0)
HEMATOCRIT: 43.5 % (ref 36.0–46.0)
HEMOGLOBIN: 14.3 g/dL (ref 12.0–15.0)
Hemoglobin: 13.6 g/dL (ref 12.0–15.0)
MCH: 31.1 pg (ref 26.0–34.0)
MCH: 31.7 pg (ref 26.0–34.0)
MCHC: 32.9 g/dL (ref 30.0–36.0)
MCHC: 33.7 g/dL (ref 30.0–36.0)
MCV: 94.6 fL (ref 78.0–100.0)
MCV: 94.2 fL (ref 78.0–100.0)
PLATELETS: 254 10*3/uL (ref 150–400)
Platelets: 271 10*3/uL (ref 150–400)
RBC: 4.6 MIL/uL (ref 3.87–5.11)
RBC: 4.29 MIL/uL (ref 3.87–5.11)
RDW: 12.9 % (ref 11.5–15.5)
RDW: 12.7 % (ref 11.5–15.5)
WBC: 6.5 10*3/uL (ref 4.0–10.5)
WBC: 5.7 10*3/uL (ref 4.0–10.5)

## 2016-01-21 LAB — DIFFERENTIAL
Basophils Absolute: 0 10*3/uL (ref 0.0–0.1)
Basophils Relative: 0 %
Eosinophils Absolute: 0 10*3/uL (ref 0.0–0.7)
Eosinophils Relative: 1 %
LYMPHS PCT: 41 %
Lymphs Abs: 2.7 10*3/uL (ref 0.7–4.0)
MONO ABS: 0.5 10*3/uL (ref 0.1–1.0)
MONOS PCT: 7 %
NEUTROS ABS: 3.3 10*3/uL (ref 1.7–7.7)
Neutrophils Relative %: 51 %

## 2016-01-21 LAB — CBG MONITORING, ED: Glucose-Capillary: 107 mg/dL — ABNORMAL HIGH (ref 65–99)

## 2016-01-21 LAB — I-STAT CHEM 8, ED
BUN: 14 mg/dL (ref 6–20)
CREATININE: 0.9 mg/dL (ref 0.44–1.00)
Calcium, Ion: 1.15 mmol/L (ref 1.15–1.40)
Chloride: 105 mmol/L (ref 101–111)
GLUCOSE: 112 mg/dL — AB (ref 65–99)
HCT: 41 % (ref 36.0–46.0)
HEMOGLOBIN: 13.9 g/dL (ref 12.0–15.0)
POTASSIUM: 3 mmol/L — AB (ref 3.5–5.1)
Sodium: 145 mmol/L (ref 135–145)
TCO2: 26 mmol/L (ref 0–100)

## 2016-01-21 LAB — RAPID URINE DRUG SCREEN, HOSP PERFORMED
Amphetamines: NOT DETECTED
BENZODIAZEPINES: NOT DETECTED
Barbiturates: NOT DETECTED
COCAINE: NOT DETECTED
OPIATES: NOT DETECTED
TETRAHYDROCANNABINOL: NOT DETECTED

## 2016-01-21 LAB — APTT
APTT: 28 s (ref 24–36)
aPTT: 25 seconds (ref 24–36)

## 2016-01-21 LAB — GLUCOSE, CAPILLARY: GLUCOSE-CAPILLARY: 99 mg/dL (ref 65–99)

## 2016-01-21 LAB — PROTIME-INR
INR: 1.06
INR: 1.13
PROTHROMBIN TIME: 13.8 s (ref 11.4–15.2)
Prothrombin Time: 14.6 seconds (ref 11.4–15.2)

## 2016-01-21 LAB — I-STAT TROPONIN, ED: Troponin i, poc: 0.01 ng/mL (ref 0.00–0.08)

## 2016-01-21 MED ORDER — STROKE: EARLY STAGES OF RECOVERY BOOK
Freq: Once | Status: AC
  Administered 2016-01-21: 19:00:00
  Filled 2016-01-21: qty 1

## 2016-01-21 MED ORDER — BRIMONIDINE TARTRATE 0.2 % OP SOLN
1.0000 [drp] | Freq: Two times a day (BID) | OPHTHALMIC | Status: DC
  Administered 2016-01-21 – 2016-01-22 (×2): 1 [drp] via OPHTHALMIC
  Filled 2016-01-21: qty 5

## 2016-01-21 MED ORDER — ENOXAPARIN SODIUM 40 MG/0.4ML ~~LOC~~ SOLN
40.0000 mg | SUBCUTANEOUS | Status: DC
  Administered 2016-01-21: 40 mg via SUBCUTANEOUS
  Filled 2016-01-21: qty 0.4

## 2016-01-21 MED ORDER — CLOPIDOGREL BISULFATE 75 MG PO TABS
75.0000 mg | ORAL_TABLET | Freq: Every day | ORAL | Status: DC
  Administered 2016-01-21 – 2016-01-22 (×2): 75 mg via ORAL
  Filled 2016-01-21 (×2): qty 1

## 2016-01-21 MED ORDER — LISINOPRIL 10 MG PO TABS: 10.0000 mg | ORAL_TABLET | Freq: Every day | ORAL | Status: DC

## 2016-01-21 MED ORDER — POTASSIUM CHLORIDE CRYS ER 20 MEQ PO TBCR
40.0000 meq | EXTENDED_RELEASE_TABLET | Freq: Once | ORAL | Status: AC
  Administered 2016-01-21: 40 meq via ORAL
  Filled 2016-01-21: qty 2

## 2016-01-21 MED ORDER — OXYCODONE HCL 5 MG PO TABS
5.0000 mg | ORAL_TABLET | ORAL | Status: DC | PRN
Start: 2016-01-21 — End: 2016-01-22

## 2016-01-21 MED ORDER — LISINOPRIL 10 MG PO TABS
10.0000 mg | ORAL_TABLET | Freq: Every day | ORAL | Status: DC
  Filled 2016-01-21: qty 1

## 2016-01-21 MED ORDER — ASPIRIN 81 MG PO CHEW
324.0000 mg | CHEWABLE_TABLET | Freq: Once | ORAL | Status: AC
  Administered 2016-01-21: 324 mg via ORAL
  Filled 2016-01-21: qty 4

## 2016-01-21 MED ORDER — ASPIRIN 325 MG PO TABS
325.0000 mg | ORAL_TABLET | Freq: Every day | ORAL | Status: DC
  Administered 2016-01-22: 325 mg via ORAL
  Filled 2016-01-21 (×2): qty 1

## 2016-01-21 MED ORDER — VITAMIN D 1000 UNITS PO TABS
2000.0000 [IU] | ORAL_TABLET | Freq: Every day | ORAL | Status: DC
  Administered 2016-01-21: 2000 [IU] via ORAL
  Filled 2016-01-21 (×2): qty 2

## 2016-01-21 MED ORDER — SENNOSIDES-DOCUSATE SODIUM 8.6-50 MG PO TABS: 1.0000 | ORAL_TABLET | Freq: Every evening | ORAL | Status: DC | PRN

## 2016-01-21 MED ORDER — ASPIRIN 300 MG RE SUPP: 300.0000 mg | Freq: Every day | RECTAL | Status: DC

## 2016-01-21 MED ORDER — ATENOLOL 25 MG PO TABS
25.0000 mg | ORAL_TABLET | Freq: Every day | ORAL | Status: DC
  Administered 2016-01-21 – 2016-01-22 (×2): 25 mg via ORAL
  Filled 2016-01-21 (×2): qty 1

## 2016-01-21 MED ORDER — LATANOPROST 0.005 % OP SOLN
1.0000 [drp] | Freq: Every day | OPHTHALMIC | Status: DC
  Administered 2016-01-21: 1 [drp] via OPHTHALMIC
  Filled 2016-01-21: qty 2.5

## 2016-01-21 NOTE — ED Notes (Signed)
[facility] contacted to call Code Stroke 

## 2016-01-21 NOTE — H&P (Signed)
TRH H&P   Patient Demographics:    [name], is a 82 y.o. female  MRN: [id number]   DOB - Nov 08, 1931  Admit Date - 01/21/2016  Outpatient Primary MD for the patient is [name], MD  Patient coming from: Home  Chief Complaint  Patient presents with  . Aphasia      HPI:    [name]  is a 82 y.o. Caucasian female with a PMH of HTN, HLD (statin Intolerant), CAD s/p CABG, Glaucoma, Arthritis and other comorbidities who presented to [facility] with a Chief Complaint of trouble expressing her words and mild weakness. Patient states that symptoms started yesterday but did not seek evaluation and went to bed and symptoms resolved. This morning she was out with her friend when her symptoms returned and patient felt they were worse than yesterday. Patient's friend stated that her friend became acutely confused and patient herself stated that she knew what she wanted to say but was unable to express what she wanted to say. Because of concern she was brought to the ED for evaluation by her friend and a Stroke Alert was called for her. Patient states she is 90% back to baseline and has never had this happen to her before. When asked about her weakness she was unable to describe it and states she just felt whole body weakness rather than focal deficits.  In the ED patient was evaluated and worked up. Head CT was done and because she was out of the tPA window, she did not receive tPA. Neurology was consulted and patient received 325 mg of ASA. Patient was admitted as Observation for her symptoms.     Review of systems:    In addition to the HPI above,  No Fever, chills, or Night sweats. No Headache, No changes with Vision or hearing, No problems swallowing food or Liquids, No Chest pain, Cough or Shortness of Breath, No Abdominal pain, Nausea or Vomiting; Bowel movements are  regular, No Blood in stool or Urine, No Dysuria, No new skin rashes or bruises, No new joints pains-aches,  No tingling, numbness in any extremity, No recent weight gain or loss, No polyuria, polydypsia or polyphagia, No significant Mental Stressors.  A full 10 point Review of Systems was done, except as stated above, all other Review of Systems were negative.   With Past History of the following :    Past Medical History:  Diagnosis Date  . Arthritis   . Bursitis    hips bilat   . CAD (coronary artery disease), autologous vein bypass graft March 2008   Follow-up cath: December 2015 Occluded SVG-D1 and occluded SVG-RI.  [name] CAD in native artery   . Dyslipidemia    Statin intolerant  . Glaucoma   . Hypertension   . Non-STEMI (non-ST elevated myocardial infarction) [facility]) October 2005   EF by 35-40%, echo 40-50%. Angiography: 99% mid LAD involving D1 followed by 70% mid LAD; 80% RI. --> CABG  . PONV (postoperative nausea and vomiting)   .  S/P CABG x 01 February 2004   LIMA-LAD, SVG-D1, SVG-RI.  [name] Statin intolerance       Past Surgical History:  Procedure Laterality Date  . BUNIONECTOMY     2002  . CARDIAC CATHETERIZATION  02/19/2004   Significant 2 vessel CAD - LAD, D1 and RI  . CARDIAC CATHETERIZATION  07/25/2006   Totally occluded SVG to diag and ramus. Patent LIMA-LAD. Ramus 70-80% proximal stenosis and occluded vein graft.  [name] CARDIOVASCULAR STRESS TEST  05/23/2006   Mild lateral/inferolateral ischemia, likely due to occluded SVGs with exhisting disease.  [name] CAROTID DOPPLER  07/15/2009   Bilat ICAs 0-49% diameter reduction. Normal patency of Bilat subclavian arteries.  . CORONARY ARTERY BYPASS GRAFT  02/22/2004   x3. LIMA to distal LAD, SVG to first diag, SVG to ramus. SVG harvest from rt thigh.  [name] EXCISION/RELEASE BURSA HIP Right 06/15/2015   Procedure: RIGHT HIP BURSECTOMY WITH GLUTEAL TENDON REPAIR;  Surgeon: Gaynelle Arabian, MD;  Location: [facility] ORS;  Service: Orthopedics;   Laterality: Right;  . EYE SURGERY     cataract surgery bilat   . LEFT HEART CATHETERIZATION WITH CORONARY/GRAFT ANGIOGRAM N/A 04/28/2014   Procedure: LEFT HEART CATHETERIZATION WITH Beatrix Fetters;  Surgeon: Sinclair Grooms, MD;  Location: [facility] CATH LAB;  Service: Cardiovascular;  Laterality: N/A;  . left total knee replacement      2001  . NM MYOVIEW LTD  January 2008; December 2015   a. Referred for For mild inferolateral and anterolateral/apical lateral defect with mild reversibility.;; b. Large defect in the anterior and inferior wall. Suggestive of potential infarct plus ischemia. HIGH RISK.  . right total knee replacement      2003  . TRANSTHORACIC ECHOCARDIOGRAM  02/19/2004; December 2015   a. EF 45-50%, Normal LV function, moderate hypokinesis of anterior wall.;; b. EF 50-55%. No RWMA, GR 1 DD. Normal valves      Social History:     Social History  Substance Use Topics  . Smoking status: Never Smoker  . Smokeless tobacco: Never Used  . Alcohol use 0.5 oz/week    1 Standard drinks or equivalent per week     Comment: 1 glass of liquor weekly       Family History :     Family History  Problem Relation Age of Onset  . Alzheimer's disease Mother   . Alzheimer's disease Sister   . Hyperlipidemia Sister   . Hypertension Sister   . Diabetes Sister   . Hyperlipidemia Sister   . Hypertension Sister   . Pancreatitis Child      Home Medications:   Prior to Admission medications   Medication Sig Start Date End Date Taking? Authorizing Provider  acetaminophen (TYLENOL) 500 MG tablet Take 500 mg by mouth every 4 (four) hours as needed for moderate pain.    Historical Provider, MD  aspirin EC 81 MG tablet Take 81 mg by mouth at bedtime.     Historical Provider, MD  atenolol (TENORMIN) 25 MG tablet Take 1 tablet (25 mg total) by mouth daily. Patient taking differently: Take 25 mg by mouth at bedtime.  05/11/15   Leonie Man, MD  bimatoprost (LUMIGAN) 0.03 %  ophthalmic solution Place 1 drop into both eyes at bedtime.    Historical Provider, MD  brimonidine (ALPHAGAN) 0.2 % ophthalmic solution Place 1 drop into both eyes 2 (two) times daily.    Historical Provider, MD  Cholecalciferol (VITAMIN D3) 2000 UNITS capsule Take 2,000 Units by mouth  at bedtime.     Historical Provider, MD  clopidogrel (PLAVIX) 75 MG tablet TAKE 1 TABLET BY MOUTH EVERY DAY 06/09/15   Leonie Man, MD  lisinopril (PRINIVIL,ZESTRIL) 10 MG tablet TAKE 1 TABLET BY MOUTH EVERY DAY 06/09/15   Leonie Man, MD  methocarbamol (ROBAXIN) 500 MG tablet Take 1 tablet (500 mg total) by mouth every 6 (six) hours as needed for muscle spasms. 06/16/15   [name], PA-C  metroNIDAZOLE (METROCREAM) 0.75 % cream Apply 1 application topically 2 (two) times daily. 04/17/13   Historical Provider, MD  oxyCODONE (OXY IR/ROXICODONE) 5 MG immediate release tablet Take 1-2 tablets (5-10 mg total) by mouth every 3 (three) hours as needed for moderate pain or severe pain. 06/16/15   [name], PA-C  triamcinolone cream (KENALOG) 0.1 % Apply 1 application topically daily as needed (rash.).  03/31/13   Historical Provider, MD     Allergies:   Allergies  Allergen Reactions  . Nsaids     unknown  . Statins     Hurt all over  . Tricor [Fenofibrate]     Hurt all over.   [name] Zetia [Ezetimibe]     Hurt all over.      Physical Exam:   Vitals  Blood pressure 178/73, pulse 78, temperature 99.3 F (37.4 C), resp. rate 14, SpO2 98 %.   1. General: 82 year old [place] Female lying in bed in NAD, WN/WD  2. Normal affect and insight, Not Suicidal or Homicidal, Awake Alert, Oriented X 3. Judgement intact.  3. No F.N deficits, CN 2-12 intact. Strength 5/5 all 4 extremities, Sensation intact all 4 extremities, Romberg sign and Gait not assessed.   4. External Ears and Nose appear normal. Eyes appear Normal, Conjunctivae clear, PERRL. Moist Oral Mucosa.  5. Supple Neck, No JVD, No cervical  lymphadenopathy appriciated, No Carotid Bruits.  6. Symmetrical Chest wall movement, Good air movement bilaterally, CTAB.  7. RRR, S1,S2. No Gallops, Rubs or Murmurs, No Parasternal Heave. Mid-sternal CABG scar noted.  8. Positive Bowel Sounds x4, Abdomen Soft, No tenderness, No organomegaly appreciated, No rebound -guarding or rigidity.  9.  No Cyanosis, Normal Skin Turgor, No Skin Rash or Bruise. Patient has arthritic changes noted in hands and feet. Callous on the bottom of her Right foot. Diffuse Seborrheic Keratoses noted on back.   10. Good muscle tone; No LE edema or Joint effusions noted, Normal ROM.  11. No Palpable Lymph Nodes in Neck or Axillae   Data Review:    CBC  Recent Labs Lab 01/21/16 1413 01/21/16 1420  WBC 6.5  --   HGB 14.3 13.9  HCT 43.5 41.0  PLT 271  --   MCV 94.6  --   MCH 31.1  --   MCHC 32.9  --   RDW 12.9  --   LYMPHSABS 2.7  --   MONOABS 0.5  --   EOSABS 0.0  --   BASOSABS 0.0  --    ------------------------------------------------------------------------------------------------------------------  Chemistries   Recent Labs Lab 01/21/16 1413 01/21/16 1420  NA 144 145  K 3.0* 3.0*  CL 109 105  CO2 28  --   GLUCOSE 115* 112*  BUN 12 14  CREATININE 0.85 0.90  CALCIUM 10.0  --   AST 18  --   ALT 14  --   ALKPHOS 74  --   BILITOT 0.6  --    ------------------------------------------------------------------------------------------------------------------ CrCl cannot be calculated (Unknown ideal weight.). ------------------------------------------------------------------------------------------------------------------ No results for input(s):  TSH, T4TOTAL, T3FREE, THYROIDAB in the last 72 hours.  Invalid input(s): FREET3  Coagulation profile  Recent Labs Lab 01/21/16 1413  INR 1.06   ------------------------------------------------------------------------------------------------------------------- No results for input(s):  DDIMER in the last 72 hours. -------------------------------------------------------------------------------------------------------------------  Cardiac Enzymes No results for input(s): CKMB, TROPONINI, MYOGLOBIN in the last 168 hours.  Invalid input(s): CK ------------------------------------------------------------------------------------------------------------------ No results found for: BNP   ---------------------------------------------------------------------------------------------------------------  Urinalysis No results found for: COLORURINE, APPEARANCEUR, [place], [place], GLUCOSEU, HGBUR, BILIRUBINUR, KETONESUR, PROTEINUR, UROBILINOGEN, NITRITE, LEUKOCYTESUR  ----------------------------------------------------------------------------------------------------------------   Imaging Results:    Ct Head Code Stroke W/o Cm  Result Date: 01/21/2016 CLINICAL DATA:  Code stroke.  Trouble finding words EXAM: CT HEAD WITHOUT CONTRAST TECHNIQUE: Contiguous axial images were obtained from the base of the skull through the vertex without intravenous contrast. COMPARISON:  None. FINDINGS: Brain: Negative for acute hemorrhage. No visible acute infarct. Remote small vessel infarcts in the bilateral cerebellum. Mild patchy low-density in the cerebral white matter attributed to chronic microvascular disease. No hydrocephalus or mass lesion. Normal cerebral volume for age. Vascular: Atherosclerotic calcification.  No hyperdense vessel. Skull: Negative Sinuses/Orbits: Bilateral cataract resection Other: ASPECTS ([place] Stroke Program Early CT Score) - Ganglionic level infarction (caudate, lentiform nuclei, internal capsule, insula, M1-M3 cortex): 7 - Supraganglionic infarction (M4-M6 cortex): 3 Total score (0-10 with 10 being normal): 10 Neurology contact number currently not working. These results were called by telephone at the time of interpretation on 01/21/2016 at 2:31 pm to Dr. Virgel Manifold ,  who verbally acknowledged these results. IMPRESSION: 1. No acute finding. No hemorrhage or visible acute infarct. ASPECTS is 10. 2. Chronic microvascular disease including small remote cerebellar infarcts. Electronically Signed   By: Monte Fantasia M.D.   On: 01/21/2016 14:34    My personal review of EKG: Rhythm NSR, Rate  80 /min, QTc 438 , no Acute ST changes on my interpretation.   Assessment & Plan:    Principal Problem:   Neurological deficit present Active Problems:   S/P CABG x 3   Statin intolerance   CAD (coronary artery disease), autologous vein bypass graft   Essential hypertension   Stroke (cerebrum) ([name])   Hypokalemia   ASSESSMENTS  1. Aphasia likely 2/2 to Suspected CVA, improving 2. Hypertension 3. Hypokalemia 4. Hyperlipidemia 5. Glaucoma 6. Osteoarthritis 7. CAD s/p CABG x3  PLAN  1. Aphasia likely 2/2 to Suspected CVA, improving  Head CT Negative for Acute Hemorrhage. Will pursue CVA work up with MRI and MRA of Brain without Contrast. Will obtain ECHOcardiogram, Carotid Dopplers, Neurochecks, PT/OT/ST Consult, Swallow Screen and obtain labs for 2/2 Stroke prevention with HbA1c and Lipid Panel and TSH. Patient to receive 325 mg of ASA after able to swallow. C/w Plavix 75 mg po daily. Will place patient on Telemetry. Neurology Dr. Cristobal Goldmann following and appreciate recommendations.   2. Hypertension  Will allow for permissive HTN currently and Start patient's Atenolol 25 mg po daily and Lisinopril 10 mg po daily tomorrow. Continue to Monitor BP closely. Will add Hydralazine if patient's SBP remains persistently >180 or DBP >105.  3. Hypokalemia  Patient's K+ was 3.0 on admission. Was repleted in ER with 40 meQ of K-DUR. Repeat CMP in Am.   4. Hyperlipidemia  Will obtain Lipid Panel in Am. Patient intolerant to Statins because of Myalgias.   5. Glaucoma  C/w Brimonidine 0.2% Opthalmic Solution and with Lantaoprost 0.005% Opthalmic Solution.  6.  Osteoarthritis  Will hold patient's Robaxin. C/w Oxycodone prn.   7. CAD s/p  CABG x3  C/w Plavix 75 mg po daily once able to swallow. Added ASA 325 mg po daily. C/w Atenolol 25 mg po daily and with Lisinopril 10 mg. Continue to monitor signs and symptoms of Angina.     DVT Prophylaxis - Lovenox - SCDs   AM Labs Ordered, also please review Full Orders  Family Communication: Admission, patients condition and plan of care including tests being ordered have been discussed with the patient and granddaughter who indicate understanding and agree with the plan and Code Status.  Code Status: DNR  Likely DC to Home tomorrow if patient does well  Condition GUARDED    Consults called: Dr. Cristobal Goldmann   Admission status: Observation   Kerney Elbe M.D on 01/21/2016 at 4:37 PM  Between 7am to 7pm - Pager - [phone]. After 7pm go to [email or web address] - password [facility]  [facility] - Office  [phone]

## 2016-01-21 NOTE — ED Triage Notes (Signed)
Pt here for aphasia and slurred speech starting one hour ago that is some improved but not completely resoved; no obvious neuro deficits at present

## 2016-01-21 NOTE — ED Provider Notes (Signed)
[facility] DEPT Provider Note   CSN: [id number] Arrival date & time: 01/21/16  1404   An emergency department physician performed an initial assessment on this suspected stroke patient at 1414.  History   Chief Complaint Chief Complaint  Patient presents with  . Aphasia    HPI [name] is a 82 y.o. female.  HPI Patient presents to the emergency room with complaints of speech difficulties.  Initially, when the patient arrived to triage nurse understood that the symptoms started about one hour ago. They were getting better but have not completely resolved. A code stroke was activated. The patient later provided additional history indicating that the symptoms started somewhat yesterday.  Her friend didn't notice an acute change today about 1 hour ago while shopping. She was confused about finding some items in her purse. Also having difficulty speaking. This was very unusual and her friend noticed change. He was concerned about the possibility of a stroke and she was brought immediately to the emergency room. She denies any weakness or numbness in her extremities. Her symptoms have completely resolved at this point. Past Medical History:  Diagnosis Date  . Arthritis   . Bursitis    hips bilat   . CAD (coronary artery disease), autologous vein bypass graft March 2008   Follow-up cath: December 2015 Occluded SVG-D1 and occluded SVG-RI.  [name] CAD in native artery   . Dyslipidemia    Statin intolerant  . Glaucoma   . Hypertension   . Non-STEMI (non-ST elevated myocardial infarction) [facility]) October 2005   EF by 35-40%, echo 40-50%. Angiography: 99% mid LAD involving D1 followed by 70% mid LAD; 80% RI. --> CABG  . PONV (postoperative nausea and vomiting)   . S/P CABG x 01 February 2004   LIMA-LAD, SVG-D1, SVG-RI.  [name] Statin intolerance     Patient Active Problem List   Diagnosis Date Noted  . Greater trochanteric bursitis of right hip 06/15/2015  . Preoperative cardiovascular  examination 05/13/2015  . Chest pain of uncertain etiology   . Chest pain 04/26/2014  . Hyperlipidemia LDL goal <70 05/14/2013  . Statin intolerance   . Essential hypertension   . CAD (coronary artery disease), autologous vein bypass graft 06/30/2006  . S/P CABG x 3 01/30/2004  . Non-STEMI (non-ST elevated myocardial infarction) ([place]) 01/30/2004    Past Surgical History:  Procedure Laterality Date  . BUNIONECTOMY     2002  . CARDIAC CATHETERIZATION  02/19/2004   Significant 2 vessel CAD - LAD, D1 and RI  . CARDIAC CATHETERIZATION  07/25/2006   Totally occluded SVG to diag and ramus. Patent LIMA-LAD. Ramus 70-80% proximal stenosis and occluded vein graft.  [name] CARDIOVASCULAR STRESS TEST  05/23/2006   Mild lateral/inferolateral ischemia, likely due to occluded SVGs with exhisting disease.  [name] CAROTID DOPPLER  07/15/2009   Bilat ICAs 0-49% diameter reduction. Normal patency of Bilat subclavian arteries.  . CORONARY ARTERY BYPASS GRAFT  02/22/2004   x3. LIMA to distal LAD, SVG to first diag, SVG to ramus. SVG harvest from rt thigh.  [name] EXCISION/RELEASE BURSA HIP Right 06/15/2015   Procedure: RIGHT HIP BURSECTOMY WITH GLUTEAL TENDON REPAIR;  Surgeon: Gaynelle Arabian, MD;  Location: [facility] ORS;  Service: Orthopedics;  Laterality: Right;  . EYE SURGERY     cataract surgery bilat   . LEFT HEART CATHETERIZATION WITH CORONARY/GRAFT ANGIOGRAM N/A 04/28/2014   Procedure: LEFT HEART CATHETERIZATION WITH Beatrix Fetters;  Surgeon: Sinclair Grooms, MD;  Location: [facility] CATH LAB;  Service: Cardiovascular;  Laterality: N/A;  . left total knee replacement      2001  . NM MYOVIEW LTD  January 2008; December 2015   a. Referred for For mild inferolateral and anterolateral/apical lateral defect with mild reversibility.;; b. Large defect in the anterior and inferior wall. Suggestive of potential infarct plus ischemia. HIGH RISK.  . right total knee replacement      2003  . TRANSTHORACIC ECHOCARDIOGRAM   02/19/2004; December 2015   a. EF 45-50%, Normal LV function, moderate hypokinesis of anterior wall.;; b. EF 50-55%. No RWMA, GR 1 DD. Normal valves     OB History    No data available       Home Medications    Prior to Admission medications   Medication Sig Start Date End Date Taking? Authorizing Provider  acetaminophen (TYLENOL) 500 MG tablet Take 500 mg by mouth every 4 (four) hours as needed for moderate pain.    Historical Provider, MD  aspirin EC 81 MG tablet Take 81 mg by mouth at bedtime.     Historical Provider, MD  atenolol (TENORMIN) 25 MG tablet Take 1 tablet (25 mg total) by mouth daily. Patient taking differently: Take 25 mg by mouth at bedtime.  05/11/15   Leonie Man, MD  bimatoprost (LUMIGAN) 0.03 % ophthalmic solution Place 1 drop into both eyes at bedtime.    Historical Provider, MD  brimonidine (ALPHAGAN) 0.2 % ophthalmic solution Place 1 drop into both eyes 2 (two) times daily.    Historical Provider, MD  Cholecalciferol (VITAMIN D3) 2000 UNITS capsule Take 2,000 Units by mouth at bedtime.     Historical Provider, MD  clopidogrel (PLAVIX) 75 MG tablet TAKE 1 TABLET BY MOUTH EVERY DAY 06/09/15   Leonie Man, MD  lisinopril (PRINIVIL,ZESTRIL) 10 MG tablet TAKE 1 TABLET BY MOUTH EVERY DAY 06/09/15   Leonie Man, MD  methocarbamol (ROBAXIN) 500 MG tablet Take 1 tablet (500 mg total) by mouth every 6 (six) hours as needed for muscle spasms. 06/16/15   [name], PA-C  metroNIDAZOLE (METROCREAM) 0.75 % cream Apply 1 application topically 2 (two) times daily. 04/17/13   Historical Provider, MD  oxyCODONE (OXY IR/ROXICODONE) 5 MG immediate release tablet Take 1-2 tablets (5-10 mg total) by mouth every 3 (three) hours as needed for moderate pain or severe pain. 06/16/15   [name], PA-C  triamcinolone cream (KENALOG) 0.1 % Apply 1 application topically daily as needed (rash.).  03/31/13   Historical Provider, MD    Family History Family History  Problem Relation  Age of Onset  . Alzheimer's disease Mother   . Alzheimer's disease Sister   . Hyperlipidemia Sister   . Hypertension Sister   . Diabetes Sister   . Hyperlipidemia Sister   . Hypertension Sister   . Pancreatitis Child     Social History Social History  Substance Use Topics  . Smoking status: Never Smoker  . Smokeless tobacco: Never Used  . Alcohol use 0.5 oz/week    1 Standard drinks or equivalent per week     Comment: 1 glass of liquor weekly      Allergies   Nsaids; Statins; Tricor [fenofibrate]; and Zetia [ezetimibe]   Review of Systems Review of Systems  All other systems reviewed and are negative.    Physical Exam Updated Vital Signs BP 172/68   Pulse 81   Temp 99.5 F (37.5 C) (Oral)   Resp 14   SpO2 100%   Physical Exam  Constitutional: She appears well-developed and well-nourished. No distress.  HENT:  Head: Normocephalic and atraumatic.  Right Ear: External ear normal.  Left Ear: External ear normal.  Eyes: Conjunctivae are normal. Right eye exhibits no discharge. Left eye exhibits no discharge. No scleral icterus.  Neck: Neck supple. No tracheal deviation present.  Cardiovascular: Normal rate, regular rhythm and intact distal pulses.   Pulmonary/Chest: Effort normal and breath sounds normal. No stridor. No respiratory distress. She has no wheezes. She has no rales.  Abdominal: Soft. Bowel sounds are normal. She exhibits no distension. There is no tenderness. There is no rebound and no guarding.  Musculoskeletal: She exhibits no edema or tenderness.  Neurological: She is alert. She has normal strength. No cranial nerve deficit (no facial droop, extraocular movements intact, no slurred speech) or sensory deficit. She exhibits normal muscle tone. She displays no seizure activity. Coordination normal.  Skin: Skin is warm and dry. No rash noted.  Psychiatric: She has a normal mood and affect.  Nursing note and vitals reviewed.    ED Treatments / Results   Labs (all labs ordered are listed, but only abnormal results are displayed) Labs Reviewed  COMPREHENSIVE METABOLIC PANEL - Abnormal; Notable for the following:       Result Value   Potassium 3.0 (*)    Glucose, Bld 115 (*)    All other components within normal limits  CBG MONITORING, ED - Abnormal; Notable for the following:    Glucose-Capillary 107 (*)    All other components within normal limits  I-STAT CHEM 8, ED - Abnormal; Notable for the following:    Potassium 3.0 (*)    Glucose, Bld 112 (*)    All other components within normal limits  PROTIME-INR  APTT  CBC  DIFFERENTIAL  I-STAT TROPOININ, ED    EKG  EKG Interpretation  Date/Time:  Friday January 21 2016 14:37:24 EDT Ventricular Rate:  80 PR Interval:    QRS Duration: 94 QT Interval:  380 QTC Calculation: 439 R Axis:   15 Text Interpretation:  Sinus rhythm Atrial premature complex Borderline prolonged PR interval Abnormal R-wave progression, early transition Borderline T abnormalities, anterior leads No significant change since last tracing Confirmed by Jenine Krisher  MD-J, Maryjo Ragon [id number]) on 01/21/2016 3:27:29 PM       Radiology Ct Head Code Stroke W/o Cm  Result Date: 01/21/2016 CLINICAL DATA:  Code stroke.  Trouble finding words EXAM: CT HEAD WITHOUT CONTRAST TECHNIQUE: Contiguous axial images were obtained from the base of the skull through the vertex without intravenous contrast. COMPARISON:  None. FINDINGS: Brain: Negative for acute hemorrhage. No visible acute infarct. Remote small vessel infarcts in the bilateral cerebellum. Mild patchy low-density in the cerebral white matter attributed to chronic microvascular disease. No hydrocephalus or mass lesion. Normal cerebral volume for age. Vascular: Atherosclerotic calcification.  No hyperdense vessel. Skull: Negative Sinuses/Orbits: Bilateral cataract resection Other: ASPECTS ([place] Stroke Program Early CT Score) - Ganglionic level infarction (caudate, lentiform nuclei,  internal capsule, insula, M1-M3 cortex): 7 - Supraganglionic infarction (M4-M6 cortex): 3 Total score (0-10 with 10 being normal): 10 Neurology contact number currently not working. These results were called by telephone at the time of interpretation on 01/21/2016 at 2:31 pm to Dr. Virgel Manifold , who verbally acknowledged these results. IMPRESSION: 1. No acute finding. No hemorrhage or visible acute infarct. ASPECTS is 10. 2. Chronic microvascular disease including small remote cerebellar infarcts. Electronically Signed   By: Monte Fantasia M.D.   On: 01/21/2016 14:34  Procedures Procedures (including critical care time)  Medications Ordered in ED Medications  potassium chloride SA (K-DUR,KLOR-CON) CR tablet 40 mEq (not administered)     Initial Impression / Assessment and Plan / ED Course  I have reviewed the triage vital signs and the nursing notes.  Pertinent labs & imaging results that were available during my care of the patient were reviewed by me and considered in my medical decision making (see chart for details).  Clinical Course    Pt was evaluated by the stroke team on arrival.  Sx concerning for the possibility of a small left lacunar stroke.  Not a TAP candidate.  Sx onset 24 hours ago.   Plan on admission for further workup  Final Clinical Impressions(s) / ED Diagnoses   Final diagnoses:  Cerebral infarction due to unspecified mechanism      Dorie Rank, MD 01/21/16 1530

## 2016-01-21 NOTE — ED Notes (Signed)
Pt a/o x 4, back to baseline now. NIH initially 1. Next neuro check at 1800. Pt indeepndent at home. Passed swallow screen.

## 2016-01-21 NOTE — ED Notes (Signed)
Pt ambulatory to restroom

## 2016-01-21 NOTE — Progress Notes (Signed)
Patient admitted to [phone]. Patient is alert, oriented x4, disoriented to birth date but states she is 23. NIHHS 1, trouble finding words at time, states her confusion has resolved. Skin is intact, has callous which was removed by podiatrist, no s/sx of infection, states it is being managed by podiatrist. Family and patient updated on plan of care, understands to call staff prior to getting out of bed.

## 2016-01-21 NOTE — Code Documentation (Signed)
82yo female arriving to [facility] via private vehicle at 1404.  Patient was out shopping when she had sudden onset difficulty speaking at 1230.  Patient presented to the ED where a code stroke was called.  Patient to CT.  Stroke team to the bedside.  NIHSS 1, see documentation for details and code stroke times.  Patient with occasional word finding difficulty on exam.  In questioning patient she reports an episode of speech difficulty yesterday and does not feel like it ever quite resolved.  She also reports feeling weak on the right side since yesterday.  Patient is outside the window for treatment with tPA.  No acute stroke treatment at this time.  Bedside handoff with ED RN Melissa.

## 2016-01-21 NOTE — Consult Note (Signed)
Requesting Physician: Dr. [name]    Chief Complaint: code stroke  History obtained from:  Patient     HPI:                                                                                                                                         [name] is an 82 y.o. female with hx as below is presenting with some word finding difficulty that started at noon today. She states she had an episode last night of the same thing with maybe a touch of R sided weakness which she thinks never resolved.   Date last known well: 01/20/16 Time last known well: 2100 tPA Given: No: outside of tpa window  Past Medical History:  Diagnosis Date  . Arthritis   . Bursitis    hips bilat   . CAD (coronary artery disease), autologous vein bypass graft March 2008   Follow-up cath: December 2015 Occluded SVG-D1 and occluded SVG-RI.  [name] CAD in native artery   . Dyslipidemia    Statin intolerant  . Glaucoma   . Hypertension   . Non-STEMI (non-ST elevated myocardial infarction) [facility]) October 2005   EF by 35-40%, echo 40-50%. Angiography: 99% mid LAD involving D1 followed by 70% mid LAD; 80% RI. --> CABG  . PONV (postoperative nausea and vomiting)   . S/P CABG x 01 February 2004   LIMA-LAD, SVG-D1, SVG-RI.  [name] Statin intolerance     Past Surgical History:  Procedure Laterality Date  . BUNIONECTOMY     2002  . CARDIAC CATHETERIZATION  02/19/2004   Significant 2 vessel CAD - LAD, D1 and RI  . CARDIAC CATHETERIZATION  07/25/2006   Totally occluded SVG to diag and ramus. Patent LIMA-LAD. Ramus 70-80% proximal stenosis and occluded vein graft.  [name] CARDIOVASCULAR STRESS TEST  05/23/2006   Mild lateral/inferolateral ischemia, likely due to occluded SVGs with exhisting disease.  [name] CAROTID DOPPLER  07/15/2009   Bilat ICAs 0-49% diameter reduction. Normal patency of Bilat subclavian arteries.  . CORONARY ARTERY BYPASS GRAFT  02/22/2004   x3. LIMA to distal LAD, SVG to first diag, SVG to ramus. SVG harvest from rt  thigh.  [name] EXCISION/RELEASE BURSA HIP Right 06/15/2015   Procedure: RIGHT HIP BURSECTOMY WITH GLUTEAL TENDON REPAIR;  Surgeon: Gaynelle Arabian, MD;  Location: [facility] ORS;  Service: Orthopedics;  Laterality: Right;  . EYE SURGERY     cataract surgery bilat   . LEFT HEART CATHETERIZATION WITH CORONARY/GRAFT ANGIOGRAM N/A 04/28/2014   Procedure: LEFT HEART CATHETERIZATION WITH Beatrix Fetters;  Surgeon: Sinclair Grooms, MD;  Location: [facility] CATH LAB;  Service: Cardiovascular;  Laterality: N/A;  . left total knee replacement      2001  . NM MYOVIEW LTD  January 2008; December 2015   a. Referred for For mild inferolateral and anterolateral/apical lateral defect with mild reversibility.;; b. Large defect in the  anterior and inferior wall. Suggestive of potential infarct plus ischemia. HIGH RISK.  . right total knee replacement      2003  . TRANSTHORACIC ECHOCARDIOGRAM  02/19/2004; December 2015   a. EF 45-50%, Normal LV function, moderate hypokinesis of anterior wall.;; b. EF 50-55%. No RWMA, GR 1 DD. Normal valves     Family History  Problem Relation Age of Onset  . Alzheimer's disease Mother   . Alzheimer's disease Sister   . Hyperlipidemia Sister   . Hypertension Sister   . Diabetes Sister   . Hyperlipidemia Sister   . Hypertension Sister   . Pancreatitis Child    Social History:  reports that she has never smoked. She has never used smokeless tobacco. She reports that she drinks about 0.5 oz of alcohol per week . She reports that she does not use drugs.  Allergies:  Allergies  Allergen Reactions  . Nsaids     unknown  . Statins     Hurt all over  . Tricor [Fenofibrate]     Hurt all over.   [name] Zetia [Ezetimibe]     Hurt all over.     Medications:                                                                                                                           I have reviewed the patient's current medications.  ROS:                                                                                                                                        History obtained from chart review and the patient  General ROS: negative for - chills, fatigue, fever, night sweats, weight gain or weight loss Psychological ROS: negative for - behavioral disorder, hallucinations, memory difficulties, mood swings or suicidal ideation Ophthalmic ROS: negative for - blurry vision, double vision, eye pain or loss of vision ENT ROS: negative for - epistaxis, nasal discharge, oral lesions, sore throat, tinnitus or vertigo Allergy and Immunology ROS: negative for - hives or itchy/watery eyes Hematological and Lymphatic ROS: negative for - bleeding problems, bruising or swollen lymph nodes Endocrine ROS: negative for - galactorrhea, hair pattern changes, polydipsia/polyuria or temperature intolerance Respiratory ROS: negative for - cough, hemoptysis, shortness of breath or wheezing Cardiovascular ROS: negative for - chest pain, dyspnea on exertion, edema or irregular heartbeat Gastrointestinal  ROS: negative for - abdominal pain, diarrhea, hematemesis, nausea/vomiting or stool incontinence Genito-Urinary ROS: negative for - dysuria, hematuria, incontinence or urinary frequency/urgency Musculoskeletal ROS: negative for - joint swelling or muscular weakness Neurological ROS: as noted in HPI Dermatological ROS: negative for rash and skin lesion changes  Neurologic Examination:                                                                                                      Blood pressure 173/79, pulse 81, temperature 99.5 F (37.5 C), temperature source Oral, resp. rate 18, SpO2 100 %.  HEENT-  Normocephalic, no lesions, without obvious abnormality.  Normal external eye and conjunctiva.  Normal TM's bilaterally.  Normal auditory canals and external ears. Normal external nose, mucus membranes and septum.  Normal pharynx. Cardiovascular- regular rate and rhythm, S1, S2 normal, no murmur,  click, rub or gallop, pulses palpable throughout   Lungs- chest clear, no wheezing, rales, normal symmetric air entry, Heart exam - S1, S2 normal, no murmur, no gallop, rate regular Abdomen- soft, non-tender; bowel sounds normal; no masses,  no organomegaly   Neurological Examination Mental Status: Alert, oriented, thought content appropriate.  Speech fluent without evidence of aphasia. Very minor word finding difficulty. Able to follow 3 step commands without difficulty. Cranial Nerves: II: Visual fields grossly normal, pupils equal, round, reactive to light and accommodation III,IV, VI: ptosis not present, extra-ocular motions intact bilaterally V,VII: smile symmetric, facial light touch sensation normal bilaterally VIII: hearing normal bilaterally IX,X: uvula rises symmetrically XI: bilateral shoulder shrug XII: midline tongue extension Motor: Right : Upper extremity   5/5    Left:     Upper extremity   5/5  Lower extremity   5/5     Lower extremity   5/5 Tone and bulk:normal tone throughout; no atrophy noted Sensory: Pinprick and light touch intact throughout, bilaterally  Cerebellar: normal finger-to-nose,     NIHSS 0   Lab Results: Basic Metabolic Panel:  Recent Labs Lab 01/21/16 1420  NA 145  K 3.0*  CL 105  GLUCOSE 112*  BUN 14  CREATININE 0.90    Liver Function Tests: No results for input(s): AST, ALT, ALKPHOS, BILITOT, PROT, ALBUMIN in the last 168 hours. No results for input(s): LIPASE, AMYLASE in the last 168 hours. No results for input(s): AMMONIA in the last 168 hours.  CBC:  Recent Labs Lab 01/21/16 1413 01/21/16 1420  WBC 6.5  --   NEUTROABS 3.3  --   HGB 14.3 13.9  HCT 43.5 41.0  MCV 94.6  --   PLT 271  --     Cardiac Enzymes: No results for input(s): CKTOTAL, CKMB, CKMBINDEX, TROPONINI in the last 168 hours.  Lipid Panel: No results for input(s): CHOL, TRIG, HDL, CHOLHDL, VLDL, LDLCALC in the last 168 hours.  CBG:  Recent  Labs Lab 01/21/16 1418  GLUCAP 107*    Microbiology: No results found for this or any previous visit.  Coagulation Studies: No results for input(s): LABPROT, INR in the last 72 hours.  Imaging: Ct Head Code Stroke W/o Cm  Result Date: 01/21/2016 CLINICAL DATA:  Code stroke.  Trouble finding words EXAM: CT HEAD WITHOUT CONTRAST TECHNIQUE: Contiguous axial images were obtained from the base of the skull through the vertex without intravenous contrast. COMPARISON:  None. FINDINGS: Brain: Negative for acute hemorrhage. No visible acute infarct. Remote small vessel infarcts in the bilateral cerebellum. Mild patchy low-density in the cerebral white matter attributed to chronic microvascular disease. No hydrocephalus or mass lesion. Normal cerebral volume for age. Vascular: Atherosclerotic calcification.  No hyperdense vessel. Skull: Negative Sinuses/Orbits: Bilateral cataract resection Other: ASPECTS ([place] Stroke Program Early CT Score) - Ganglionic level infarction (caudate, lentiform nuclei, internal capsule, insula, M1-M3 cortex): 7 - Supraganglionic infarction (M4-M6 cortex): 3 Total score (0-10 with 10 being normal): 10 Neurology contact number currently not working. These results were called by telephone at the time of interpretation on 01/21/2016 at 2:31 pm to Dr. Virgel Manifold , who verbally acknowledged these results. IMPRESSION: 1. No acute finding. No hemorrhage or visible acute infarct. ASPECTS is 10. 2. Chronic microvascular disease including small remote cerebellar infarcts. Electronically Signed   By: Monte Fantasia M.D.   On: 01/21/2016 14:34        Assessment: 82 y.o. female with hx as below is presenting with some word finding difficulty that started at noon today. She states she had an episode last night of the same thing with maybe a touch of R sided weakness which she thinks never resolved.   NIHSS 0  May have small lacunar stroke in L frontal lobe but nothing focal on  exam  Will need stroke workup as below   1. HgbA1c, fasting lipid panel 2. MRI, MRA  of the brain without contrast 3. PT consult, OT consult, Speech consult 4. Echocardiogram 5. Carotid dopplers 6. Prophylactic therapy-Antiplatelet med: Aspirin - dose 325 7. Risk factor modification 8. Telemetry monitoring 9. Frequent neuro checks 10 NPO until passes stroke swallow screen 11 please page stroke NP  Or  PA  Or MD from 8am -4 pm  as this patient from this time will be  followed by the stroke.   You can look them up on [email or web address]  Password [email or web address]     Stroke Risk Factors - hyperlipidemia and hypertension

## 2016-01-21 NOTE — Progress Notes (Signed)
Dr. Waldron Labs notified of patient's admit to floor.

## 2016-01-22 ENCOUNTER — Observation Stay (HOSPITAL_BASED_OUTPATIENT_CLINIC_OR_DEPARTMENT_OTHER): Payer: Medicare Other

## 2016-01-22 ENCOUNTER — Observation Stay (HOSPITAL_COMMUNITY)
Admit: 2016-01-22 | Discharge: 2016-01-22 | Disposition: A | Discharge status: Home / Self Care | Payer: Medicare Other | Attending: Neurology | Admitting: Neurology

## 2016-01-22 DIAGNOSIS — E876 Hypokalemia: Secondary | ICD-10-CM | POA: Diagnosis not present

## 2016-01-22 DIAGNOSIS — Z951 Presence of aortocoronary bypass graft: Secondary | ICD-10-CM | POA: Diagnosis not present

## 2016-01-22 DIAGNOSIS — Z889 Allergy status to unspecified drugs, medicaments and biological substances status: Secondary | ICD-10-CM | POA: Diagnosis not present

## 2016-01-22 DIAGNOSIS — G459 Transient cerebral ischemic attack, unspecified: Secondary | ICD-10-CM | POA: Diagnosis not present

## 2016-01-22 DIAGNOSIS — I10 Essential (primary) hypertension: Secondary | ICD-10-CM | POA: Diagnosis not present

## 2016-01-22 DIAGNOSIS — I6789 Other cerebrovascular disease: Secondary | ICD-10-CM | POA: Diagnosis not present

## 2016-01-22 DIAGNOSIS — R29818 Other symptoms and signs involving the nervous system: Secondary | ICD-10-CM | POA: Diagnosis not present

## 2016-01-22 LAB — COMPREHENSIVE METABOLIC PANEL
ALBUMIN: 3.5 g/dL (ref 3.5–5.0)
ALT: 11 U/L — ABNORMAL LOW (ref 14–54)
ANION GAP: 8 (ref 5–15)
AST: 15 U/L (ref 15–41)
Alkaline Phosphatase: 57 U/L (ref 38–126)
BUN: 9 mg/dL (ref 6–20)
CO2: 28 mmol/L (ref 22–32)
Calcium: 9.8 mg/dL (ref 8.9–10.3)
Chloride: 110 mmol/L (ref 101–111)
Creatinine, Ser: 0.71 mg/dL (ref 0.44–1.00)
GFR calc Af Amer: >60 mL/min (ref >60)
GFR calc non Af Amer: >60 mL/min (ref >60)
GLUCOSE: 91 mg/dL (ref 65–99)
POTASSIUM: 3.1 mmol/L — AB (ref 3.5–5.1)
SODIUM: 146 mmol/L — AB (ref 135–145)
TOTAL PROTEIN: 5.9 g/dL — AB (ref 6.5–8.1)
Total Bilirubin: 0.7 mg/dL (ref 0.3–1.2)

## 2016-01-22 LAB — CBC WITH DIFFERENTIAL/PLATELET
BASOS PCT: 0 %
Basophils Absolute: 0 10*3/uL (ref 0.0–0.1)
EOS ABS: 0 10*3/uL (ref 0.0–0.7)
Eosinophils Relative: 1 %
HCT: 38.4 % (ref 36.0–46.0)
Hemoglobin: 12.6 g/dL (ref 12.0–15.0)
Lymphocytes Relative: 43 %
Lymphs Abs: 2.1 10*3/uL (ref 0.7–4.0)
MCH: 31 pg (ref 26.0–34.0)
MCHC: 32.8 g/dL (ref 30.0–36.0)
MCV: 94.6 fL (ref 78.0–100.0)
MONO ABS: 0.4 10*3/uL (ref 0.1–1.0)
MONOS PCT: 9 %
Neutro Abs: 2.3 10*3/uL (ref 1.7–7.7)
Neutrophils Relative %: 47 %
PLATELETS: 231 10*3/uL (ref 150–400)
RBC: 4.06 MIL/uL (ref 3.87–5.11)
RDW: 12.7 % (ref 11.5–15.5)
WBC: 4.9 10*3/uL (ref 4.0–10.5)

## 2016-01-22 LAB — LIPID PANEL
CHOL/HDL RATIO: 3.7 ratio
CHOLESTEROL: 234 mg/dL — AB (ref 0–200)
HDL: 63 mg/dL (ref >40)
LDL Cholesterol: 151 mg/dL — ABNORMAL HIGH (ref 0–99)
TRIGLYCERIDES: 98 mg/dL (ref <150)
VLDL: 20 mg/dL (ref 0–40)

## 2016-01-22 LAB — ECHOCARDIOGRAM COMPLETE
HEIGHTINCHES: 66 in
Weight: 2271.62 oz

## 2016-01-22 LAB — GLUCOSE, CAPILLARY
GLUCOSE-CAPILLARY: 91 mg/dL (ref 65–99)
Glucose-Capillary: 107 mg/dL — ABNORMAL HIGH (ref 65–99)

## 2016-01-22 LAB — PHOSPHORUS: Phosphorus: 3.1 mg/dL (ref 2.5–4.6)

## 2016-01-22 LAB — MAGNESIUM: Magnesium: 1.8 mg/dL (ref 1.7–2.4)

## 2016-01-22 MED ORDER — POTASSIUM CHLORIDE CRYS ER 20 MEQ PO TBCR
40.0000 meq | EXTENDED_RELEASE_TABLET | Freq: Once | ORAL | Status: AC
  Administered 2016-01-22: 40 meq via ORAL
  Filled 2016-01-22: qty 4

## 2016-01-22 NOTE — Progress Notes (Signed)
OT Cancellation Note  Patient Details Name: LENAMARIE BURGHART MRN: [id number] DOB: Feb 11, 1932   Cancelled Treatment:    Reason Eval/Treat Not Completed: Patient at procedure or test/ unavailable (EEG).  Binnie Kand M.S., OTR/L Pager: [phone]  01/22/2016, 11:30 AM

## 2016-01-22 NOTE — Progress Notes (Signed)
VASCULAR LAB PRELIMINARY  PRELIMINARY  PRELIMINARY  PRELIMINARY  Carotid duplex completed.    Preliminary report:  1-39% ICA plaquing.  Vertebral artery flow is antegrade.   Glade Strausser, RVT 01/22/2016, 2:30 PM

## 2016-01-22 NOTE — Discharge Instructions (Signed)
Aspirin, ASA chewable tablets °What is this medicine? °ASPIRIN (AS pir in) is a pain reliever. It is used to treat mild pain and fever. This medicine is also used as directed by a doctor to prevent and to treat heart attacks, to prevent strokes, and to treat arthritis or inflammation. °This medicine may be used for other purposes; ask your health care provider or pharmacist if you have questions. °What should I tell my health care provider before I take this medicine? °They need to know if you have any of these conditions: °-anemia °-asthma °-bleeding problems °-child with chickenpox, the flu, or other viral infection °-diabetes °-gout °-if you frequently drink alcohol containing drinks °-kidney disease °-liver disease °-low level of vitamin K °-lupus °-smoke tobacco °-stomach ulcers or other problems °-an unusual or allergic reaction to aspirin, tartrazine dye, other medicines, dyes, or preservatives °-pregnant or trying to get pregnant °-breast-feeding °How should I use this medicine? °Take this medicine by mouth. Chew it completely before swallowing. Follow the directions on the package or prescription label. Do not take your medicine more often than directed. °Talk to your pediatrician regarding the use of this medicine in children. While this drug may be prescribed for children as young as 12 years of age for selected conditions, precautions do apply. Children and teenagers should not use this medicine to treat chicken pox or flu symptoms unless directed by a doctor. °Patients over 65 years old may have a stronger reaction and need a smaller dose. °Overdosage: If you think you have taken too much of this medicine contact a poison control center or emergency room at once. °NOTE: This medicine is only for you. Do not share this medicine with others. °What if I miss a dose? °If you are taking this medicine on a regular schedule and miss a dose, take it as soon as you can. If it is almost time for your next dose,  take only that dose. Do not take double or extra doses. °What may interact with this medicine? °Do not take this medicine with any of the following medications: °-cidofovir °-ketorolac °-probenecid °This medicine may also interact with the following medications: °-alcohol °-alendronate °-bismuth subsalicylate °-flavocoxid °-herbal supplements like feverfew, garlic, ginger, ginkgo biloba, horse chestnut °-medicines for diabetes or glaucoma like acetazolamide, methazolamide °-medicines for gout °-medicines that treat or prevent blood clots like enoxaparin, heparin, ticlopidine, warfarin °-other aspirin and aspirin-like medicines °-NSAIDs, medicines for pain and inflammation, like ibuprofen or naproxen °-pemetrexed °-sulfinpyrazone °-varicella live vaccine °This list may not describe all possible interactions. Give your health care provider a list of all the medicines, herbs, non-prescription drugs, or dietary supplements you use. Also tell them if you smoke, drink alcohol, or use illegal drugs. Some items may interact with your medicine. °What should I watch for while using this medicine? °If you are treating yourself for pain, tell your doctor or health care professional if the pain lasts more than 10 days, if it gets worse, or if there is a new or different kind of pain. Tell your doctor if you see redness or swelling. Also, check with your doctor if you have a fever that lasts for more than 3 days. Only take this medicine to prevent heart attacks or blood clotting if prescribed by your doctor or health care professional. °Do not take aspirin or aspirin-like medicines with this medicine. Too much aspirin can be dangerous. Always read the labels carefully. °This medicine can irritate your stomach or cause bleeding problems. Do not smoke cigarettes   or drink alcohol while taking this medicine. Do not lie down for 30 minutes after taking this medicine to prevent irritation to your throat. °If you are scheduled for any  medical or dental procedure, tell your healthcare provider that you are taking this medicine. You may need to stop taking this medicine before the procedure. °What side effects may I notice from receiving this medicine? °Side effects that you should report to your doctor or health care professional as soon as possible: °-allergic reactions like skin rash, itching or hives, swelling of the face, lips, or tongue °-breathing problems °-changes in hearing, ringing in the ears °-confusion °-general ill feeling or flu-like symptoms °-pain on swallowing °-redness, blistering, peeling or loosening of the skin, including inside the mouth or nose °-signs and symptoms of bleeding such as bloody or black, tarry stools; red or dark-brown urine; spitting up blood or brown material that looks like coffee grounds; red spots on the skin; unusual bruising or bleeding from the eye, gums, or nose °-trouble passing urine or change in the amount of urine °-unusually weak or tired °-yellowing of the eyes or skin °Side effects that usually do not require medical attention (report to your doctor or health care professional if they continue or are bothersome): °-diarrhea or constipation °-nausea, vomiting °-stomach gas, heartburn °This list may not describe all possible side effects. Call your doctor for medical advice about side effects. You may report side effects to FDA at 1-800-FDA-1088. °Where should I keep my medicine? °Keep out of the reach of children. °Store at room temperature between 15 and 30 degrees C (59 and 86 degrees F). Protect from heat and moisture. Do not use this medicine if it has a strong vinegar smell. Throw away any unused medicine after the expiration date. °NOTE: This sheet is a summary. It may not cover all possible information. If you have questions about this medicine, talk to your doctor, pharmacist, or health care provider. °  °© 2016, [employer]. (2012-08-07 15:34:27) ° °

## 2016-01-22 NOTE — Procedures (Signed)
History: 82 year old female with a history of multiple episodes of transient aphasia  Sedation: None  Technique: This is a 21 channel routine scalp EEG performed at the bedside with bipolar and monopolar montages arranged in accordance to the international 10/20 system of electrode placement. One channel was dedicated to EKG recording.    Background: The background consists of intermixed alpha and beta activities. There is a well defined posterior dominant rhythm of 9 Hz that attenuates with eye opening. Sleep is recorded with normal appearing structures.   Photic stimulation: Physiologic driving is not performed  EEG Abnormalities: None  Clinical Interpretation: This normal EEG is recorded in the waking and sleep state. There was no seizure or seizure predisposition recorded on this study. Please note that a normal EEG does not preclude the possibility of epilepsy.   Roland Rack, MD [facility] [phone]  If 7pm- 7am, please page neurology on call as listed in [place].

## 2016-01-22 NOTE — Progress Notes (Signed)
Subjective: No further issues.   Exam: Vitals:   01/22/16 0600 01/22/16 0819  BP: (!) 165/62 (!) 174/63  Pulse: (!) 57 63  Resp: 18 18  Temp: 97.7 F (36.5 C) 98 F (36.7 C)   Gen: In bed, NAD Resp: non-labored breathing, no acute distress Abd: soft, nt  Neuro: MS: awake, alert, interactive and appropriate.  [id number], still with binocular diploplia. , right VI palsy Motor: MAEW Sensory:intact to LT  Pertinent Labs: LDL 151  Impression: 82 year old female with recurrent episodes of word finding difficulty and confusion. Possible etiologies could be underlying cognitive disorder, Partial seizures, but at least the most recent episode sounds most like TIA. EEG was recommended, and his are then performed at the time of finalizing this note, and is negative.  Recommendations: 1) EEG (already performed at the time of finalizing this note) 2) statin therapy for elevated LDL 3) continue aspirin and Plavix 4) hypertension control 5) await carotid Dopplers, if there was severe stenosis on the left then would favor vascular surgery consult. 6) Echo cardiogram pending   Roland Rack, MD [facility] [phone]  If 7pm- 7am, please page neurology on call as listed in [place].

## 2016-01-22 NOTE — Discharge Summary (Signed)
Physician Discharge Summary  [name] [id number] DOB: July 18, 1931 DOA: 01/21/2016  PCP: [name], MD  Admit date: 01/21/2016 Discharge date: 01/22/2016  Recommendations for Outpatient Follow-up:  1. Follow up with PCP in 1-2 weeks after discahrge  Discharge Diagnoses:  Principal Problem:   Neurological deficit present Active Problems:   S/P CABG x 3   Statin intolerance   CAD (coronary artery disease), autologous vein bypass graft   Essential hypertension   Stroke (cerebrum) ([place])    Discharge Condition: stable   Diet recommendation: as tolerated   History of present illness:   Per HPI 01/21/2016 "82 y.o. Caucasian female with a PMH of HTN, HLD (statin Intolerant), CAD s/p CABG, Glaucoma, Arthritis and other comorbidities who presented to [facility] with a Chief Complaint of trouble expressing her words and mild weakness. Patient states that symptoms started yesterday but did not seek evaluation and went to bed and symptoms resolved. This morning she was out with her friend when her symptoms returned and patient felt they were worse than yesterday. Patient's friend stated that her friend became acutely confused and patient herself stated that she knew what she wanted to say but was unable to express what she wanted to say. Because of concern she was brought to the ED for evaluation by her friend and a Stroke Alert was called for her. Patient states she is 90% back to baseline and has never had this happen to her before. When asked about her weakness she was unable to describe it and states she just felt whole body weakness rather than focal deficits.  In the ED patient was evaluated and worked up. Head CT was done and because she was out of the tPA window, she did not receive tPA. Neurology was consulted and patient received 325 mg of ASA. Patient was admitted as Observation for her symptoms."  Hospital Course:   Principal Problem: Aphasia Stroke  work up initiated:  - Acute stroke ruled out  - Continue aspirin and plavix daily  - CT head - no acute intracranial findings  - MRI brain / MRA brain - no acute intracranial findings; chronic cerebellar infarcts; 5 mm right ICA paraophthalmic aneurysm and possible 3 mm left ICA aneurysm near the petrous - cavernous junction  - 2D ECHO - 55%, grade 2 DD - Carotid doppler - 1-39% CA plaquing  - A1c is pending - LDL is 151; she has an allergy to stains  - PT eval - outpt PT, referral to outpt therapy provided  - EEG with no evidence of acute seizures   Active Problems:   S/P CABG x 3 - Continue aspirin and plavix     Essential hypertension - Continue atenolol    Hypokalemia - Supplemented     Hypernatremia - Encouraged fluid intake   Signed:  Leisa Lenz, MD  [facility] 01/22/2016, 2:43 PM  Pager #: [phone]  Time spent in minutes: less than 30 minutes   Discharge Exam: Vitals:   01/22/16 1012 01/22/16 1300  BP: (!) 195/68 (!) 158/60  Pulse: 71 62  Resp: 16 17  Temp: 98.4 F (36.9 C) 98.3 F (36.8 C)   Vitals:   01/22/16 0600 01/22/16 0819 01/22/16 1012 01/22/16 1300  BP: (!) 165/62 (!) 174/63 (!) 195/68 (!) 158/60  Pulse: (!) 57 63 71 62  Resp: 18 18 16 17   Temp: 97.7 F (36.5 C) 98 F (36.7 C) 98.4 F (36.9 C) 98.3 F (36.8 C)  TempSrc: Oral  Oral Oral  SpO2: 98% 97% 98% 100%  Weight:      Height:        General: Pt is alert, follows commands appropriately, not in acute distress Cardiovascular: Regular rate and rhythm, S1/S2 (+) Respiratory: Clear to auscultation bilaterally, no wheezing, no crackles, no rhonchi Abdominal: Soft, non tender, non distended, bowel sounds +, no guarding Extremities: no edema, no cyanosis, pulses palpable bilaterally DP and PT Neuro: Grossly nonfocal  Discharge Instructions  Discharge Instructions    Call MD for:  persistant nausea and vomiting    Complete by:  As directed    Call MD for:  redness,  tenderness, or signs of infection (pain, swelling, redness, odor or green/yellow discharge around incision site)    Complete by:  As directed    Call MD for:  severe uncontrolled pain    Complete by:  As directed    Diet - low sodium heart healthy    Complete by:  As directed    Increase activity slowly    Complete by:  As directed        Medication List    TAKE these medications   acetaminophen 500 MG tablet Commonly known as:  TYLENOL Take 500 mg by mouth every 4 (four) hours as needed for moderate pain.   aspirin EC 81 MG tablet Take 81 mg by mouth at bedtime.   atenolol 25 MG tablet Commonly known as:  TENORMIN Take 1 tablet (25 mg total) by mouth daily. What changed:  when to take this   bimatoprost 0.03 % ophthalmic solution Commonly known as:  LUMIGAN Place 1 drop into both eyes at bedtime.   brimonidine 0.2 % ophthalmic solution Commonly known as:  ALPHAGAN Place 1 drop into both eyes 2 (two) times daily.   clopidogrel 75 MG tablet Commonly known as:  PLAVIX TAKE 1 TABLET BY MOUTH EVERY DAY   lisinopril 20 MG tablet Commonly known as:  PRINIVIL,ZESTRIL Take 20 mg by mouth daily.   lisinopril 10 MG tablet Commonly known as:  PRINIVIL,ZESTRIL TAKE 1 TABLET BY MOUTH EVERY DAY   methocarbamol 500 MG tablet Commonly known as:  ROBAXIN Take 1 tablet (500 mg total) by mouth every 6 (six) hours as needed for muscle spasms.   metroNIDAZOLE 0.75 % cream Commonly known as:  METROCREAM Apply 1 application topically 2 (two) times daily.   oxyCODONE 5 MG immediate release tablet Commonly known as:  Oxy IR/ROXICODONE Take 1-2 tablets (5-10 mg total) by mouth every 3 (three) hours as needed for moderate pain or severe pain.   triamcinolone cream 0.1 % Commonly known as:  KENALOG Apply 1 application topically daily as needed (rash.).   Vitamin D3 2000 units capsule Take 2,000 Units by mouth at bedtime.      Follow-up Information    [name], MD.  Schedule an appointment as soon as possible for a visit in 1 week(s).   Specialty:  Family Medicine Contact information: [address] [place] [address] [phone]            The results of significant diagnostics from this hospitalization (including imaging, microbiology, ancillary and laboratory) are listed below for reference.    Significant Diagnostic Studies: Mr Brain Wo Contrast  Result Date: 01/21/2016 CLINICAL DATA:  Speech difficulty. EXAM: MRI HEAD WITHOUT CONTRAST MRA HEAD WITHOUT CONTRAST TECHNIQUE: Multiplanar, multiecho pulse sequences of the brain and surrounding structures were obtained without intravenous contrast. Angiographic images of the head were obtained using  MRA technique without contrast. COMPARISON:  Head CT 01/21/2016 FINDINGS: MRI HEAD FINDINGS Brain: There is no evidence of acute infarct, intracranial hemorrhage, mass, midline shift, or extra-axial fluid collection. Ventricles and sulci are within normal limits for age. Multiple small chronic infarcts are again seen in both cerebellar hemispheres. Patchy T2 hyperintensities in the subcortical and deep cerebral white matter are nonspecific but compatible with moderate chronic small vessel ischemic disease. Vascular: Major intracranial vascular flow voids are preserved. Skull and upper cervical spine: No suspicious osseous lesion. Cervical spondylosis and facet degeneration partially visualized. Sinuses/Orbits: Prior bilateral cataract extraction. No significant inflammatory disease in the paranasal sinuses are mastoid air cells. Other: None. MRA HEAD FINDINGS The visualized distal vertebral arteries are patent with the right being strongly dominant and supplying the basilar. The left vertebral artery functionally terminates in PICA. Right PICA origin is patent as well. SCA origins are patent. AICAs are not well evaluated. Basilar artery is patent without significant stenosis. There is a fetal type  origin of the left PCA. PCAs are patent with mild distal branch vessel irregularity but no significant proximal stenosis. Right-sided distal cervical ICA loop. Intracranial ICAs are patent without evidence of significant stenosis. There is a 5 x 4 mm right paraophthalmic ICA aneurysm projecting inferomedially. Both cavernous ICAs are mildly ectatic. There may be a 3 mm broad-based ICA aneurysm near the petrous-cavernous junction. Mild narrowing of the left ICA terminus may be developmental given absent left A1 segment. The right A1 segment is widely patent and supplies the left ACA. MCAs are patent without evidence of major branch occlusion or significant proximal stenosis. Very mild distal MCA branch vessel irregularity bilaterally. IMPRESSION: 1. No acute intracranial abnormality. 2. Moderate chronic small vessel ischemic disease. 3. Chronic cerebellar infarcts. 4. No major intracranial branch vessel occlusion or significant proximal stenosis. 5. 5 mm right ICA paraophthalmic aneurysm. 6. Possible 3 mm left ICA aneurysm near the petrous-cavernous junction. Electronically Signed   By: Logan Bores M.D.   On: 01/21/2016 21:19   Mr Jodene Nam Head/brain [id number] Cm  Result Date: 01/21/2016 CLINICAL DATA:  Speech difficulty. EXAM: MRI HEAD WITHOUT CONTRAST MRA HEAD WITHOUT CONTRAST TECHNIQUE: Multiplanar, multiecho pulse sequences of the brain and surrounding structures were obtained without intravenous contrast. Angiographic images of the head were obtained using MRA technique without contrast. COMPARISON:  Head CT 01/21/2016 FINDINGS: MRI HEAD FINDINGS Brain: There is no evidence of acute infarct, intracranial hemorrhage, mass, midline shift, or extra-axial fluid collection. Ventricles and sulci are within normal limits for age. Multiple small chronic infarcts are again seen in both cerebellar hemispheres. Patchy T2 hyperintensities in the subcortical and deep cerebral white matter are nonspecific but compatible with  moderate chronic small vessel ischemic disease. Vascular: Major intracranial vascular flow voids are preserved. Skull and upper cervical spine: No suspicious osseous lesion. Cervical spondylosis and facet degeneration partially visualized. Sinuses/Orbits: Prior bilateral cataract extraction. No significant inflammatory disease in the paranasal sinuses are mastoid air cells. Other: None. MRA HEAD FINDINGS The visualized distal vertebral arteries are patent with the right being strongly dominant and supplying the basilar. The left vertebral artery functionally terminates in PICA. Right PICA origin is patent as well. SCA origins are patent. AICAs are not well evaluated. Basilar artery is patent without significant stenosis. There is a fetal type origin of the left PCA. PCAs are patent with mild distal branch vessel irregularity but no significant proximal stenosis. Right-sided distal cervical ICA loop. Intracranial ICAs are patent without evidence of significant stenosis. There  is a 5 x 4 mm right paraophthalmic ICA aneurysm projecting inferomedially. Both cavernous ICAs are mildly ectatic. There may be a 3 mm broad-based ICA aneurysm near the petrous-cavernous junction. Mild narrowing of the left ICA terminus may be developmental given absent left A1 segment. The right A1 segment is widely patent and supplies the left ACA. MCAs are patent without evidence of major branch occlusion or significant proximal stenosis. Very mild distal MCA branch vessel irregularity bilaterally. IMPRESSION: 1. No acute intracranial abnormality. 2. Moderate chronic small vessel ischemic disease. 3. Chronic cerebellar infarcts. 4. No major intracranial branch vessel occlusion or significant proximal stenosis. 5. 5 mm right ICA paraophthalmic aneurysm. 6. Possible 3 mm left ICA aneurysm near the petrous-cavernous junction. Electronically Signed   By: Logan Bores M.D.   On: 01/21/2016 21:19   Ct Head Code Stroke W/o Cm  Result Date:  01/21/2016 CLINICAL DATA:  Code stroke.  Trouble finding words EXAM: CT HEAD WITHOUT CONTRAST TECHNIQUE: Contiguous axial images were obtained from the base of the skull through the vertex without intravenous contrast. COMPARISON:  None. FINDINGS: Brain: Negative for acute hemorrhage. No visible acute infarct. Remote small vessel infarcts in the bilateral cerebellum. Mild patchy low-density in the cerebral white matter attributed to chronic microvascular disease. No hydrocephalus or mass lesion. Normal cerebral volume for age. Vascular: Atherosclerotic calcification.  No hyperdense vessel. Skull: Negative Sinuses/Orbits: Bilateral cataract resection Other: ASPECTS ([place] Stroke Program Early CT Score) - Ganglionic level infarction (caudate, lentiform nuclei, internal capsule, insula, M1-M3 cortex): 7 - Supraganglionic infarction (M4-M6 cortex): 3 Total score (0-10 with 10 being normal): 10 Neurology contact number currently not working. These results were called by telephone at the time of interpretation on 01/21/2016 at 2:31 pm to Dr. Virgel Manifold , who verbally acknowledged these results. IMPRESSION: 1. No acute finding. No hemorrhage or visible acute infarct. ASPECTS is 10. 2. Chronic microvascular disease including small remote cerebellar infarcts. Electronically Signed   By: Monte Fantasia M.D.   On: 01/21/2016 14:34    Microbiology: No results found for this or any previous visit (from the past 240 hour(s)).   Labs: Basic Metabolic Panel:  Recent Labs Lab 01/21/16 1413 01/21/16 1420 01/21/16 1659 01/22/16 0258  NA 144 145  --  146*  K 3.0* 3.0*  --  3.1*  CL 109 105  --  110  CO2 28  --   --  28  GLUCOSE 115* 112*  --  91  BUN 12 14  --  9  CREATININE 0.85 0.90 0.80 0.71  CALCIUM 10.0  --   --  9.8  MG  --   --   --  1.8  PHOS  --   --   --  3.1   Liver Function Tests:  Recent Labs Lab 01/21/16 1413 01/22/16 0258  AST 18 15  ALT 14 11*  ALKPHOS 74 57  BILITOT 0.6 0.7   PROT 7.2 5.9*  ALBUMIN 4.4 3.5   No results for input(s): LIPASE, AMYLASE in the last 168 hours. No results for input(s): AMMONIA in the last 168 hours. CBC:  Recent Labs Lab 01/21/16 1413 01/21/16 1420 01/21/16 1659 01/22/16 0258  WBC 6.5  --  5.7 4.9  NEUTROABS 3.3  --   --  2.3  HGB 14.3 13.9 13.6 12.6  HCT 43.5 41.0 40.4 38.4  MCV 94.6  --  94.2 94.6  PLT 271  --  254 231   Cardiac Enzymes: No results for input(s): CKTOTAL,  CKMB, CKMBINDEX, TROPONINI in the last 168 hours. BNP: BNP (last 3 results) No results for input(s): BNP in the last 8760 hours.  ProBNP (last 3 results) No results for input(s): PROBNP in the last 8760 hours.  CBG:  Recent Labs Lab 01/21/16 1418 01/21/16 2200 01/22/16 0613 01/22/16 1116  GLUCAP 107* 99 91 107*

## 2016-01-22 NOTE — Progress Notes (Signed)
  Echocardiogram 2D Echocardiogram has been performed.  Jennette Dubin 01/22/2016, 2:26 PM

## 2016-01-22 NOTE — Evaluation (Signed)
Occupational Therapy Evaluation and Discharge Patient Details Name: [name] MRN: [id number] DOB: 1931/05/10 Today's Date: 01/22/2016    History of Present Illness  [name]  is a 82 y.o. Caucasian female with a PMH of HTN, HLD (statin Intolerant), CAD s/p CABG, Glaucoma, Arthritis and other comorbidities who presented to [facility] with a Chief Complaint of trouble expressing her words and mild weakness.  Pt with history of righ hip bursectomy and gluteal tendon repair.     Clinical Impression   Pt currently overall mod I with ADL and functional mobility which is pts baseline. Pt reports all symptoms have resolved with no residual weakness reported. Pt planning to d/c home with intermittent supervision from her granddaughter. No further acute OT needs identified; signing off at this time. Please re-consult if needs change. Thank you for this referral.    Follow Up Recommendations  No OT follow up;Supervision - Intermittent    Equipment Recommendations  None recommended by OT    Recommendations for Other Services       Precautions / Restrictions Precautions Precautions: Fall Restrictions Weight Bearing Restrictions: No      Mobility Bed Mobility Overal bed mobility: Modified Independent             General bed mobility comments: Increased time required  Transfers Overall transfer level: Modified independent Equipment used: None             General transfer comment: Increased time.    Balance Overall balance assessment: Needs assistance Sitting-balance support: Feet supported;No upper extremity supported Sitting balance-Leahy Scale: Normal     Standing balance support: No upper extremity supported;During functional activity Standing balance-Leahy Scale: Good Standing balance comment: mildly unsteady                            ADL Overall ADL's : Modified independent                                        General ADL Comments: Increased time but able to perfrom toilet transfer, UB/LB dressing without physical assist. Pt reports balance and gait at baseline currently.     Vision Additional Comments: Appears WFL.   Perception     Praxis      Pertinent Vitals/Pain Pain Assessment: No/denies pain     Hand Dominance Right   Extremity/Trunk Assessment Upper Extremity Assessment Upper Extremity Assessment: Overall WFL for tasks assessed   Lower Extremity Assessment Lower Extremity Assessment: Defer to PT evaluation   Cervical / Trunk Assessment Cervical / Trunk Assessment: Normal   Communication Communication Communication: No difficulties   Cognition Arousal/Alertness: Awake/alert Behavior During Therapy: WFL for tasks assessed/performed Overall Cognitive Status: Within Functional Limits for tasks assessed                     General Comments       Exercises       Shoulder Instructions      Home Living Family/patient expects to be discharged to:: Private residence Living Arrangements: Other relatives (granddaughter) Available Help at Discharge: Family;Available PRN/intermittently Type of Home: House Home Access: Stairs to enter [employer] of Steps: 3-4  Entrance Stairs-Rails: Can reach both Home Layout: Two level;Able to live on main level with bedroom/bathroom     Bathroom Shower/Tub: Walk-in shower;Door   [employer] Toilet: Handicapped  height     Home Equipment: Walker - 2 wheels;Grab bars - tub/shower          Prior Functioning/Environment Level of Independence: Independent        Comments: pt independent and drives.  was taking OPPT recently for hip and glut.  she says they worked on balance some        OT Problem List: Impaired balance (sitting and/or standing)   OT Treatment/Interventions:      OT Goals(Current goals can be found in the care plan section) Acute Rehab OT Goals Patient Stated Goal: to get home  safely OT Goal Formulation: All assessment and education complete, DC therapy  OT Frequency:     Barriers to D/C:            Co-evaluation              End of Session Nurse Communication: Mobility status (RN present to observe mobility)  Activity Tolerance: Patient tolerated treatment well Patient left: with nursing/sitter in room;with family/visitor present   Time: [id number] OT Time Calculation (min): 17 min Charges:  OT General Charges $OT Visit: 1 Procedure OT Evaluation $OT Eval Low Complexity: 1 Procedure G-Codes: OT G-codes **NOT FOR INPATIENT CLASS** Functional Assessment Tool Used: Clinical judgement Functional Limitation: Self care Self Care Current Status [id number]): At least 1 percent but less than 20 percent impaired, limited or restricted Self Care Goal Status [id number]): At least 1 percent but less than 20 percent impaired, limited or restricted Self Care Discharge Status [phone]): At least 1 percent but less than 20 percent impaired, limited or restricted   Binnie Kand M.S., OTR/L Pager: [phone]  01/22/2016, 4:30 PM

## 2016-01-22 NOTE — Evaluation (Signed)
Physical Therapy Evaluation Patient Details Name: [name] MRN: [id number] DOB: [birth year] Today's Date: 01/22/2016   History of Present Illness   [name]  is a 82 y.o. Caucasian female with a PMH of HTN, HLD (statin Intolerant), CAD s/p CABG, Glaucoma, Arthritis and other comorbidities who presented to [facility] with a Chief Complaint of trouble expressing her words and mild weakness.  Pt with history of righ hip bursectomy and gluteal tendon repair.    Clinical Impression  Pt finished orthopedic OPPT recently.  She said it helped a little bit.  Pt said she is walking at baseline (although s he tended to drift to the right) (pt with a lot of trunk side to side movement - both sides with narrow BOS).  Pt has RW and cane at home but doesn't like to use.  Pt scored 42/56 on BERG balance assessment.  Would recommend at this time that pt try OPPT -neuro off [place] to address more balance issues than orthopedic to see if this would help decrease fall potential.  Pt agreeable to plan.  Pt OK to DC home when medically stable.    Follow Up Recommendations Outpatient PT    Equipment Recommendations   (pt has RW and cane - she doesnt like to use either. she uses shopping cart in stores)    Recommendations for Other Services       Precautions / Restrictions Precautions Precautions: Fall Restrictions Weight Bearing Restrictions: No Other Position/Activity Restrictions: pt has bunions on feet and needs to wear her shoes for extra support -she has padding in her shoes to help with this      Mobility  Bed Mobility Overal bed mobility: Independent                Transfers Overall transfer level: Independent                  Ambulation/Gait Ambulation/Gait assistance: Min guard   Assistive device: None       General Gait Details: pt said this is her basline with walking - she denies falling. she has tried a cane before and said it doesnt help.   pt said she finished ehr OPPT a couple months ago from [facility] and has appt with dr [name] next week for orthopedic (she says back - stenosis)  Stairs            Wheelchair Mobility    Modified Rankin (Stroke Patients Only)       Balance                                 Standardized Balance Assessment Standardized Balance Assessment : Oceanographer Test Berg Balance Test Sit to Stand: Able to stand without using hands and stabilize independently Standing Unsupported: Able to stand safely 2 minutes Sitting with Back Unsupported but Feet Supported on Floor or Stool: Able to sit safely and securely 2 minutes Stand to Sit: Sits safely with minimal use of hands Transfers: Able to transfer safely, minor use of hands Standing Unsupported with Eyes Closed: Able to stand 10 seconds safely Standing Ubsupported with Feet Together: Able to place feet together independently and stand 1 minute safely From Standing, Reach Forward with Outstretched Arm: Can reach forward >12 cm safely (5") From Standing Position, Pick up Object from Floor: Able to pick up shoe safely and easily From Standing Position, Turn to Look Behind Over  each Shoulder: Turn sideways only but maintains balance Turn 360 Degrees: Able to turn 360 degrees safely but slowly Standing Unsupported, Alternately Place Feet on Step/Stool: Able to complete >2 steps/needs minimal assist Standing Unsupported, One Foot in Front: Needs help to step but can hold 15 seconds Standing on One Leg: Tries to lift leg/unable to hold 3 seconds but remains standing independently Total Score: 42         Pertinent Vitals/Pain Pain Assessment: 0-10 Pain Location: pt has bursitis in right hip    Home Living Family/patient expects to be discharged to:: Private residence Living Arrangements:  (pts granddaughter lives with her and is Pharmacist, hospital - gone during the days) Available Help at Discharge: Family;Available  PRN/intermittently Type of Home: House Home Access: Stairs to enter Entrance Stairs-Rails: Can reach both Entrance Stairs-Number of Steps: 3-4  Home Layout: Two level;Able to live on main level with bedroom/bathroom Home Equipment: Walker - 2 wheels      Prior Function Level of Independence: Independent         Comments: pt independent and drives.  was taking OPPT recently for hip and glut.  she says they worked on balance some     Hand Dominance   Dominant Hand: Right    Extremity/Trunk Assessment   Upper Extremity Assessment: Overall WFL for tasks assessed           Lower Extremity Assessment: Overall WFL for tasks assessed (pt weak and not coordinated in both ankles w tih basic ankle pumps)      Cervical / Trunk Assessment: Normal  Communication   Communication: No difficulties  Cognition Arousal/Alertness: Awake/alert Behavior During Therapy: WFL for tasks assessed/performed Overall Cognitive Status: Within Functional Limits for tasks assessed                      General Comments General comments (skin integrity, edema, etc.): pt did well with everything but unilateral stance activitiies - both legs. she has had therapy several times for her hips and balance.  she says she has exercises at home    Exercises     Assessment/Plan    PT Assessment Patient needs continued PT services  PT Problem List Decreased balance;Decreased coordination          PT Treatment Interventions      PT Goals (Current goals can be found in the Care Plan section)  Acute Rehab PT Goals Patient Stated Goal: to get home safely    Frequency     Barriers to discharge   pt will be home by herself during the day.  also pt manages the 2 dogs - concerned about this.  Pt VU    Co-evaluation               End of Session Equipment Utilized During Treatment: Gait belt Activity Tolerance: Patient tolerated treatment well Patient left: in chair;with call bell/phone  within reach;with chair alarm set Nurse Communication: Mobility status    Functional Assessment Tool Used: per clinical judgement Functional Limitation: Mobility: Walking and moving around Mobility: Walking and Moving Around Current Status [id number]): At least 1 percent but less than 20 percent impaired, limited or restricted Mobility: Walking and Moving Around Goal Status [phone]): At least 1 percent but less than 20 percent impaired, limited or restricted Mobility: Walking and Moving Around Discharge Status [phone]): At least 1 percent but less than 20 percent impaired, limited or restricted    Time: 0840-0920 PT Time Calculation (min) (ACUTE ONLY): 40  min   Charges:   PT Evaluation $PT Eval Moderate Complexity: 1 Procedure PT Treatments $Gait Training: 23-37 mins   PT G Codes:   PT G-Codes **NOT FOR INPATIENT CLASS** Functional Assessment Tool Used: per clinical judgement Functional Limitation: Mobility: Walking and moving around Mobility: Walking and Moving Around Current Status [id number]): At least 1 percent but less than 20 percent impaired, limited or restricted Mobility: Walking and Moving Around Goal Status [phone]): At least 1 percent but less than 20 percent impaired, limited or restricted Mobility: Walking and Moving Around Discharge Status [phone]): At least 1 percent but less than 20 percent impaired, limited or restricted    Loyal Buba 01/22/2016, 9:26 AM 01/22/2016   Rande Lawman, PT

## 2016-01-22 NOTE — Progress Notes (Signed)
D/c'd home via wc w/grand daughter

## 2016-01-22 NOTE — Progress Notes (Signed)
EEG Completed; Results Pending  

## 2016-01-23 LAB — HEMOGLOBIN A1C
Hgb A1c MFr Bld: 5.4 % (ref 4.8–5.6)
Mean Plasma Glucose: 108 mg/dL

## 2016-01-24 DIAGNOSIS — G459 Transient cerebral ischemic attack, unspecified: Secondary | ICD-10-CM | POA: Diagnosis not present

## 2016-01-27 ENCOUNTER — Encounter: Payer: Self-pay | Admitting: Neurology

## 2016-01-27 ENCOUNTER — Ambulatory Visit (INDEPENDENT_AMBULATORY_CARE_PROVIDER_SITE_OTHER): Payer: Medicare Other | Admitting: Neurology

## 2016-01-27 VITALS — BP 175/83 | HR 84 | Ht 66.0 in | Wt 130.2 lb

## 2016-01-27 DIAGNOSIS — R413 Other amnesia: Secondary | ICD-10-CM

## 2016-01-27 DIAGNOSIS — I63443 Cerebral infarction due to embolism of bilateral cerebellar arteries: Secondary | ICD-10-CM

## 2016-01-27 DIAGNOSIS — R251 Tremor, unspecified: Secondary | ICD-10-CM | POA: Diagnosis not present

## 2016-01-27 DIAGNOSIS — E538 Deficiency of other specified B group vitamins: Secondary | ICD-10-CM

## 2016-01-27 DIAGNOSIS — N390 Urinary tract infection, site not specified: Secondary | ICD-10-CM

## 2016-01-27 DIAGNOSIS — I25810 Atherosclerosis of coronary artery bypass graft(s) without angina pectoris: Secondary | ICD-10-CM

## 2016-01-27 DIAGNOSIS — F05 Delirium due to known physiological condition: Secondary | ICD-10-CM | POA: Diagnosis not present

## 2016-01-27 DIAGNOSIS — G458 Other transient cerebral ischemic attacks and related syndromes: Secondary | ICD-10-CM | POA: Diagnosis not present

## 2016-01-27 DIAGNOSIS — E519 Thiamine deficiency, unspecified: Secondary | ICD-10-CM | POA: Diagnosis not present

## 2016-01-27 DIAGNOSIS — R410 Disorientation, unspecified: Secondary | ICD-10-CM

## 2016-01-27 NOTE — Progress Notes (Signed)
[facility]    Provider:  [name] Referring Provider: Carol Ada, MD Primary Care Physician:  [name], MD  CC:  TIA  HPI:  [name] is a 82 y.o. female here as a referral from Dr. Tamala Julian for TIA.  She has a past medical history of vitamin D deficiency, high cholesterol, hypertension, atherosclerotic heart disease, osteoarthritis, glaucoma, history of coronary artery bypass, TIA. Patient was out shopping and she suddenly couldn't talk, words wouldn;t come. No inciting event or previous illnesses or head trauma. MRi of the brain was negative for acute event. Friend is here and provides information. Friend says patient was confused for a short period of time afterwards, Patient couldn't find her keys, she was confused, had a difficult time finding money right in her purse, she couldn;t make complete sentences. Patient says she remembers it all, no LOC or altered awareness. She was thinking but she couldn't make words come out. Symptoms resolved completely. However she is also having hallucination recently, she is also seeing people, it just started a day and a half ago, she is seeing people one had a cat with them and she saw someone in the yard. They were in her bedroom all night. Just started since yesterday, in the past week patient is more confused, she was seen Monday with Dr. Tamala Julian and they say a work up for metabolic/infectios causes ruled out however they do not think urine was taken. Patient is more confused in the last few weeks. She is having difficulty saying things, not completing sentences. More confusion. Also progressive memory changes the last year. Sister and mother with Alzheimer's dz  Cholesterol LDL 151. CMP 08/16/2015 with BUN 24 and creatinine 0.91.hgba1c 5.4.  Reviewed notes, labs and imaging from outside physicians, which showed:  Personally reviewed MRI images of the brain and garee with the following: IMPRESSION: 1. No acute  intracranial abnormality. 2. Moderate chronic small vessel ischemic disease. 3. Chronic cerebellar infarcts. 4. No major intracranial branch vessel occlusion or significant proximal stenosis. 5. 5 mm right ICA paraophthalmic aneurysm. 6. Possible 3 mm left ICA aneurysm near the petrous-cavernous Junction.  EEG normal. ECHO did not show any clots in the heart.   Review of Systems: Patient complains of symptoms per HPI as well as the following symptoms:weight loss, fatigue, blurred vision, easy bruising him a hearing loss, runny nose, memory loss, confusion, headache, numbness, weakness, dizziness, tremor, decreased energy, change in appetite, disinterest in activities ertinent negatives per HPI. All others negative.   Social History   Social History  . Marital status: Widowed    Spouse name: N/A  . Number of children: 2  . Years of education: 16+   Occupational History  . retired    Social History Main Topics  . Smoking status: Never Smoker  . Smokeless tobacco: Never Used  . Alcohol use 0.5 oz/week    1 Standard drinks or equivalent per week     Comment: 1 glass of liquor weekly   . Drug use: No  . Sexual activity: Not on file   Other Topics Concern  . Not on file   Social History Narrative   She is a widowed, mother of 2 grandmother of 3.    Lives with granddaughter.   Drinks coffee every morning. 1 cup.   She used to do exercising through the [employer] program as well as the [employer]. Unfortunately, this finding of the classes for seniors changed, and she was unable to make  the new classes. She did not like exercising with non-seniors. Besides that she does existing disease, walks up and down the stairs, walk her dog. She is not as active as he had been before.   She never smoked, never drank alcohol.          Family History  Problem Relation Age of Onset  . Alzheimer's disease Mother   . Alzheimer's disease Sister   . Hyperlipidemia Sister   . Hypertension  Sister   . Diabetes Sister   . Hyperlipidemia Sister   . Hypertension Sister   . Pancreatitis Child     Past Medical History:  Diagnosis Date  . Arthritis   . Bursitis    hips bilat   . CAD (coronary artery disease), autologous vein bypass graft March 2008   Follow-up cath: December 2015 Occluded SVG-D1 and occluded SVG-RI.  [name] CAD in native artery   . Dyslipidemia    Statin intolerant  . Glaucoma   . Hypertension   . Non-STEMI (non-ST elevated myocardial infarction) [facility]) October 2005   EF by 35-40%, echo 40-50%. Angiography: 99% mid LAD involving D1 followed by 70% mid LAD; 80% RI. --> CABG  . PONV (postoperative nausea and vomiting)   . S/P CABG x 01 February 2004   LIMA-LAD, SVG-D1, SVG-RI.  [name] Statin intolerance     Past Surgical History:  Procedure Laterality Date  . BUNIONECTOMY     2002  . CARDIAC CATHETERIZATION  02/19/2004   Significant 2 vessel CAD - LAD, D1 and RI  . CARDIAC CATHETERIZATION  07/25/2006   Totally occluded SVG to diag and ramus. Patent LIMA-LAD. Ramus 70-80% proximal stenosis and occluded vein graft.  [name] CARDIOVASCULAR STRESS TEST  05/23/2006   Mild lateral/inferolateral ischemia, likely due to occluded SVGs with exhisting disease.  [name] CAROTID DOPPLER  07/15/2009   Bilat ICAs 0-49% diameter reduction. Normal patency of Bilat subclavian arteries.  . CORONARY ARTERY BYPASS GRAFT  02/22/2004   x3. LIMA to distal LAD, SVG to first diag, SVG to ramus. SVG harvest from rt thigh.  [name] EXCISION/RELEASE BURSA HIP Right 06/15/2015   Procedure: RIGHT HIP BURSECTOMY WITH GLUTEAL TENDON REPAIR;  Surgeon: Gaynelle Arabian, MD;  Location: [facility];  Service: Orthopedics;  Laterality: Right;  . EYE SURGERY     cataract surgery bilat   . LEFT HEART CATHETERIZATION WITH CORONARY/GRAFT ANGIOGRAM N/A 04/28/2014   Procedure: LEFT HEART CATHETERIZATION WITH Beatrix Fetters;  Surgeon: Sinclair Grooms, MD;  Location: [facility] CATH LAB;  Service: Cardiovascular;  Laterality: N/A;    . left total knee replacement      2001  . NM MYOVIEW LTD  January 2008; December 2015   a. Referred for For mild inferolateral and anterolateral/apical lateral defect with mild reversibility.;; b. Large defect in the anterior and inferior wall. Suggestive of potential infarct plus ischemia. HIGH RISK.  . right total knee replacement      2003  . TRANSTHORACIC ECHOCARDIOGRAM  02/19/2004; December 2015   a. EF 45-50%, Normal LV function, moderate hypokinesis of anterior wall.;; b. EF 50-55%. No RWMA, GR 1 DD. Normal valves     Current Outpatient Prescriptions  Medication Sig Dispense Refill  . acetaminophen (TYLENOL) 500 MG tablet Take 500 mg by mouth every 4 (four) hours as needed for moderate pain.    [name] aspirin EC 81 MG tablet Take 81 mg by mouth at bedtime.     [name] atenolol (TENORMIN) 25 MG tablet Take 1 tablet (25 mg total)  by mouth daily. (Patient taking differently: Take 25 mg by mouth at bedtime. ) 30 tablet 11  . bimatoprost (LUMIGAN) 0.03 % ophthalmic solution Place 1 drop into both eyes at bedtime.    . brimonidine (ALPHAGAN) 0.2 % ophthalmic solution Place 1 drop into both eyes 2 (two) times daily.    . Cholecalciferol (VITAMIN D3) 2000 UNITS capsule Take 2,000 Units by mouth at bedtime.     . clopidogrel (PLAVIX) 75 MG tablet TAKE 1 TABLET BY MOUTH EVERY DAY 30 tablet 10  . lisinopril (PRINIVIL,ZESTRIL) 10 MG tablet TAKE 1 TABLET BY MOUTH EVERY DAY 30 tablet 10  . lisinopril (PRINIVIL,ZESTRIL) 20 MG tablet Take 20 mg by mouth daily.    . metroNIDAZOLE (METROCREAM) 0.75 % cream Apply 1 application topically 2 (two) times daily.    [name] triamcinolone cream (KENALOG) 0.1 % Apply 1 application topically daily as needed (rash.).     [name] methocarbamol (ROBAXIN) 500 MG tablet Take 1 tablet (500 mg total) by mouth every 6 (six) hours as needed for muscle spasms. (Patient not taking: Reported on 01/27/2016) 80 tablet 0  . oxyCODONE (OXY IR/ROXICODONE) 5 MG immediate release tablet Take 1-2  tablets (5-10 mg total) by mouth every 3 (three) hours as needed for moderate pain or severe pain. (Patient not taking: Reported on 01/27/2016) 80 tablet 0   No current facility-administered medications for this visit.     Allergies as of 01/27/2016 - Review Complete 01/27/2016  Allergen Reaction Noted  . Nsaids  05/08/2013  . Statins  05/08/2013  . Tricor [fenofibrate]  05/08/2013  . Zetia [ezetimibe]  05/08/2013    Vitals: BP (!) 175/83 (BP Location: Left Arm, Patient Position: Sitting, Cuff Size: Normal)   Pulse 84   Ht 5\' 6"  (1.676 m)   Wt 130 lb 3.2 oz (59.1 kg)   BMI 21.01 kg/m  Last Weight:  Wt Readings from Last 1 Encounters:  01/27/16 130 lb 3.2 oz (59.1 kg)   Last Height:   Ht Readings from Last 1 Encounters:  01/27/16 5\' 6"  (1.676 m)    Physical exam: Exam: Gen: NAD, conversant, well nourised, well groomed                     CV: RRR, no MRG. No Carotid Bruits. No peripheral edema, warm, nontender Eyes: Conjunctivae clear without exudates or hemorrhage  Neuro: Detailed Neurologic Exam  Speech:    Speech is normal; fluent and spontaneous with impaired comprehension when asked to perform complex tasks.  Cognition:  MMSE - Mini Mental State Exam 01/27/2016  Orientation to time 4  Orientation to Place 4  Registration 3  Attention/ Calculation 2  Recall 2  Language- name 2 objects 2  Language- repeat 1  Language- follow 3 step command 3  Language- read & follow direction 1  Write a sentence 1  Copy design 0  Total score 23      The patient is oriented to person, place, and time;     recent memory impaired and remote memory intact;     language fluent;     Impaired attention, concentration,     fund of knowledge impaired Cranial Nerves:    The pupils are equal, round, and reactive to light. Attempted fundoscopic exam could not visualiz. Visual fields are full to finger confrontation. Extraocular movements are intact. Trigeminal sensation is intact and  the muscles of mastication are normal. The face is symmetric. The palate elevates in the midline. Hearing intact.  Voice is normal. Shoulder shrug is normal. The tongue has normal motion without fasciculations.   Coordination:    Normal finger to nose and heel to shin.  Gait:    Antalgic due to right leg pain (says she had a surgery on the right hip tendon and since then difficulty walking)  Motor Observation: mild postural and head tremor. No resting tremor.  Tone: Mild cogwheeling right > left UE. [name]    Posture:    Posture is normal. normal erect    Strength: Mild prox leg weakness bilat otherwise strength is V/V in the upper and lower limbs.      Sensation: intact to LT     Reflex Exam:  DTR's:    Deep tendon reflexes in the upper and lower extremities are normal bilaterally.   Toes:    The toes are downgoing bilaterally.   Clonus:    Clonus is absent.      Assessment/Plan:  82 y.o. female here as a referral from Dr. Tamala Julian for TIA.  She has a past medical history of vitamin D deficiency, high cholesterol, hypertension, atherosclerotic heart disease, osteoarthritis, glaucoma,  coronary artery bypass. Here for evaluation of acute episode of confusion vs TIA. Also more confused in the last few weeks with hallucinations.  - Will check a UA today but given recent increased confusion would like her to see Dr. Tamala Julian again to ensure no other metabolic/tocix/infectious causes. - Patient with multiple bilateral cerebellar infarcts already on plavix and aspirin. Needs a loop recorder for possible embolic causes. Will contact Dr. Ellyn Hack her cardiologist and see if he would like to see her or if I should send her directly to Dr. Rayann Heman.  -23/30 on MMSE unclear if this is decreased due to recent confusion or if she has onset of dementia. Need to follow. - 39mm and 46mm aneurysms, discussed with patient and friend and risk of bleeding but at this point will hold off on any intervention until  confusion has been investigated, will see her back very quickly in 3 months and may order CTA or refer for cerebral angiography based on patient preference and risks and benefits of waiting vs intervention. They agreed. - Discussed dementia, gave resources for [employer] and organization. Also possible is lewy body dementia given the hallucinations. But she may also have a UTI or other cause for her recent confusion. - EEG for recent confusion. - Labs  Cc: [name], MD   Sarina Ill, MD  [facility] [address] [place], [place] [address]  Phone [phone] Fax [phone]

## 2016-01-27 NOTE — Patient Instructions (Signed)
Remember to drink plenty of fluid, eat healthy meals and do not skip any meals. Try to eat protein with a every meal and eat a healthy snack such as fruit or nuts in between meals. Try to keep a regular sleep-wake schedule and try to exercise daily, particularly in the form of walking, 20-30 minutes a day, if you can.   As far as your medications are concerned, I would like to suggest: Continue Aspirin and Plavix.  As far as diagnostic testing: urine test today, will refer to Dr. Rayann Heman for Loop Recorder for possible atrial fibrillation  I would like to see you back in 3 months, sooner if we need to. Please call us with any interim questions, concerns, problems, updates or refill requests.   Our phone number is [phone]. We also have an after hours call service for urgent matters and there is a physician on-call for urgent questions. For any emergencies you know to call 911 or go to the nearest emergency room

## 2016-01-28 ENCOUNTER — Encounter: Payer: Self-pay | Admitting: Neurology

## 2016-01-28 DIAGNOSIS — M4807 Spinal stenosis, lumbosacral region: Secondary | ICD-10-CM | POA: Diagnosis not present

## 2016-01-28 LAB — MICROSCOPIC EXAMINATION

## 2016-01-28 LAB — UA/M W/RFLX CULTURE, ROUTINE
Bilirubin, UA: NEGATIVE
Glucose, UA: NEGATIVE
Ketones, UA: NEGATIVE
LEUKOCYTES UA: NEGATIVE
NITRITE UA: NEGATIVE
PH UA: 5.5 (ref 5.0–7.5)
RBC UA: NEGATIVE
Urobilinogen, Ur: 0.2 mg/dL (ref 0.2–1.0)

## 2016-01-30 ENCOUNTER — Other Ambulatory Visit: Payer: Self-pay | Admitting: Neurology

## 2016-01-30 DIAGNOSIS — I63443 Cerebral infarction due to embolism of bilateral cerebellar arteries: Secondary | ICD-10-CM

## 2016-02-01 ENCOUNTER — Telehealth: Payer: Self-pay | Admitting: *Deleted

## 2016-02-01 LAB — SEDIMENTATION RATE: Sed Rate: 2 mm/hr (ref 0–40)

## 2016-02-01 LAB — VITAMIN B1: Thiamine: 110.5 nmol/L (ref 66.5–200.0)

## 2016-02-01 LAB — VITAMIN B12: VITAMIN B 12: 300 pg/mL (ref 211–946)

## 2016-02-01 LAB — AMMONIA: AMMONIA: 35 ug/dL (ref 19–87)

## 2016-02-01 LAB — METHYLMALONIC ACID, SERUM: METHYLMALONIC ACID: 239 nmol/L (ref 0–378)

## 2016-02-01 LAB — TSH: TSH: 1.81 u[IU]/mL (ref 0.450–4.500)

## 2016-02-01 NOTE — Telephone Encounter (Signed)
Called and spoke to pt about unremarkable labs per [name]. She verbalized understanding and has no further questions at this time.

## 2016-02-01 NOTE — Telephone Encounter (Signed)
-----   Message from Melvenia Beam, MD sent at 02/01/2016  8:48 AM EDT ----- Labs unremarkable thanks

## 2016-02-14 LAB — VAS US CAROTID
LCCADDIAS: 17 cm/s
LCCADSYS: 59 cm/s
LCCAPDIAS: -10 cm/s
LCCAPSYS: -76 cm/s
LEFT ECA DIAS: -7 cm/s
LEFT VERTEBRAL DIAS: -8 cm/s
LICADDIAS: -18 cm/s
LICADSYS: -60 cm/s
Left ICA prox dias: -21 cm/s
Left ICA prox sys: -64 cm/s
RCCADSYS: -81 cm/s
RCCAPSYS: 51 cm/s
RIGHT ECA DIAS: -5 cm/s
RIGHT VERTEBRAL DIAS: 10 cm/s
Right CCA prox dias: 16 cm/s

## 2016-02-16 ENCOUNTER — Encounter: Payer: Self-pay | Admitting: Cardiology

## 2016-02-16 ENCOUNTER — Ambulatory Visit (INDEPENDENT_AMBULATORY_CARE_PROVIDER_SITE_OTHER): Payer: Medicare Other | Admitting: Cardiology

## 2016-02-16 VITALS — BP 142/80 | HR 75 | Ht 66.0 in | Wt 134.0 lb

## 2016-02-16 DIAGNOSIS — I25810 Atherosclerosis of coronary artery bypass graft(s) without angina pectoris: Secondary | ICD-10-CM | POA: Diagnosis not present

## 2016-02-16 DIAGNOSIS — I25718 Atherosclerosis of autologous vein coronary artery bypass graft(s) with other forms of angina pectoris: Secondary | ICD-10-CM

## 2016-02-16 DIAGNOSIS — E785 Hyperlipidemia, unspecified: Secondary | ICD-10-CM

## 2016-02-16 DIAGNOSIS — I10 Essential (primary) hypertension: Secondary | ICD-10-CM

## 2016-02-16 DIAGNOSIS — Z789 Other specified health status: Secondary | ICD-10-CM

## 2016-02-16 DIAGNOSIS — Z951 Presence of aortocoronary bypass graft: Secondary | ICD-10-CM

## 2016-02-16 DIAGNOSIS — I214 Non-ST elevation (NSTEMI) myocardial infarction: Secondary | ICD-10-CM

## 2016-02-16 DIAGNOSIS — R29818 Other symptoms and signs involving the nervous system: Secondary | ICD-10-CM

## 2016-02-16 DIAGNOSIS — I639 Cerebral infarction, unspecified: Secondary | ICD-10-CM | POA: Insufficient documentation

## 2016-02-16 NOTE — Patient Instructions (Signed)
CONTINUE TAKING LISINOPRIL 20 MG DAILY   Your physician recommends that you schedule a follow-up appointment [id number] TO DISCUSS LOOP RECORDER- AT [place] [facility] .  Your physician recommends that you schedule a follow-up appointment [id number] CLINIC ([facility]) AT [place] [facility].   Your physician wants you to follow-up in: [place] will  receive a reminder letter in the mail two months in advance. If you don't receive a letter, please call our office to schedule the follow-up appointment.  If you need a refill on your cardiac medications before your next appointment, please call your pharmacy.

## 2016-02-16 NOTE — Progress Notes (Signed)
PCP: Erica Naas, MD  Clinic Note: Chief Complaint  Patient presents with  . Follow-up    Recent TIA  . Coronary Artery Disease    HPI: [name] is a 82 y.o. female with a PMH below who presents today for Post- Hosptial f/u. Early for 1 year follow-up of CAD-CABG, and for preoperative evaluation.[name] She was first diagnosed with coronary disease back in October 2005 when she had a non-STEMI. She was referred for CABG.  She also has hypertension and dyslipidemia.  [name] was last seen in Jan 2017 - she was doing relatively well at that time.  Recent Hospitalizations:   September 22: Ruled out for acute stroke.  ~ TIA.  Also noted HTN  Studies Reviewed:   Echo 01/22/2016: EF 55%. GR 2 DD. Mild MR.  She has an EEG ordered for October 20. Her neurologist has recommended a Loop Recorder to monitor for occult A. fib as a potential etiology for her cerebellar infarcts.  Interval History: [name] presents today doing relatively well. She really doesn't have much in with complaints. She has intermittent episodes of chest discomfort, but is not exertional, and not like her previous anginal symptoms. It sometimes with certain movements. She also notes occasional positional dizziness and was standing up really fast. She does not note any rapid or irregular heartbeat/palpitations. She is not aware of any PACs or irregular heartbeats. No active anginal chest pain or dyspnea with rest or exertion. No heart failure symptoms of PND, orthopnea or edema. No palpitations, lightheadedness, dizziness, weakness or syncope/near syncope. No further TIA/amaurosis fugax symptoms since d/c.  Speech is clear & fluent.   No claudication.  ROS: A comprehensive was performed. Review of Systems  HENT: Negative for nosebleeds.   Eyes: Positive for blurred vision.  Cardiovascular: Negative for claudication.       Per history of present illness  Gastrointestinal: Negative for blood in  stool and melena.  Genitourinary: Negative for hematuria.  Neurological: Positive for dizziness (mostly positional).  Endo/Heme/Allergies: Does not bruise/bleed easily.  Psychiatric/Behavioral: Negative.   All other systems reviewed and are negative.   Past Medical History:  Diagnosis Date  . Arthritis   . Bursitis    hips bilat   . CAD (coronary artery disease), autologous vein bypass graft March 2008   Follow-up cath: December 2015 Occluded SVG-D1 and occluded SVG-RI.  [name] CAD in native artery   . Dyslipidemia    Statin intolerant  . Glaucoma   . Hypertension   . Non-STEMI (non-ST elevated myocardial infarction) [facility]) October 2005   EF by 35-40%, echo 40-50%. Angiography: 99% mid LAD involving D1 followed by 70% mid LAD; 80% RI. --> CABG  . PONV (postoperative nausea and vomiting)   . S/P CABG x 01 February 2004   LIMA-LAD, SVG-D1, SVG-RI.  [name] Statin intolerance     Past Surgical History:  Procedure Laterality Date  . BUNIONECTOMY     2002  . CARDIOVASCULAR STRESS TEST  05/23/2006   Mild lateral/inferolateral ischemia, likely due to occluded SVGs with exhisting disease.  [name] CAROTID DOPPLER  07/15/2009   Bilat ICAs 0-49% diameter reduction. Normal patency of Bilat subclavian arteries.  . CORONARY ARTERY BYPASS GRAFT  02/22/2004   x3. LIMA to distal LAD, SVG to first diag, SVG to ramus. SVG harvest from rt thigh.  [name] EXCISION/RELEASE BURSA HIP Right 06/15/2015   Procedure: RIGHT HIP BURSECTOMY WITH GLUTEAL TENDON REPAIR;  Surgeon: Gaynelle Arabian, MD;  Location: [facility];  Service: Orthopedics;  Laterality: Right;  . EYE SURGERY     cataract surgery bilat   . LEFT HEART CATHETERIZATION WITH CORONARY ANGIOGRAM  02/19/2004   Significant 2 vessel CAD - LAD, D1 and RI  . LEFT HEART CATHETERIZATION WITH CORONARY/GRAFT ANGIOGRAM N/A 04/28/2014   Procedure: LEFT HEART CATHETERIZATION WITH [name];  Surgeon: Sinclair Grooms, MD;  Location: [facility] CATH LAB: CTO of SVG-Diag &  SVG-RI, patent LIMA-LAD. Patent native circumflex, LAD and RCA. 70% stenosis in a branch of RI. --> Does not explain "high risk perfusion study "  . LEFT HEART CATHETERIZATION WITH CORONARY/GRAFT ANGIOGRAM   07/25/2006   Totally occluded SVG to diag and ramus. Patent LIMA-LAD. Ramus 70-80% proximal stenosis and occluded vein graft.  [name] left total knee replacement      2001  . NM MYOVIEW LTD  January 2008; December 2015   a. Referred for For mild inferolateral and anterolateral/apical lateral defect with mild reversibility.;; b. Large defect in the anterior and inferior wall. Suggestive of potential infarct plus ischemia. HIGH RISK.  . right total knee replacement      2003  . TRANSTHORACIC ECHOCARDIOGRAM  02/19/2004; December 2015   a. EF 45-50%, Normal LV function, moderate hypokinesis of anterior wall.;; b. EF 50-55%. No RWMA, GR 1 DD. Normal valves      Prior to Admission medications   Medication Sig Start Date End Date Taking? Authorizing Provider  aspirin EC 81 MG tablet Take 81 mg by mouth daily.   Yes Historical Provider, MD  atenolol (TENORMIN) 25 MG tablet Take 1 tablet (25 mg total) by mouth daily. 05/11/15  Yes Leonie Man, MD  bimatoprost (LUMIGAN) 0.03 % ophthalmic solution Place 1 drop into both eyes at bedtime.   Yes Historical Provider, MD  brimonidine (ALPHAGAN) 0.2 % ophthalmic solution Place 1 drop into both eyes 2 (two) times daily.   Yes Historical Provider, MD  Cholecalciferol (VITAMIN D3) 2000 UNITS capsule Take 2,000 Units by mouth daily.   Yes Historical Provider, MD  clopidogrel (PLAVIX) 75 MG tablet TAKE 1 TABLET BY MOUTH EVERY DAY 05/18/14  Yes Leonie Man, MD  lisinopril (PRINIVIL,ZESTRIL) 10 MG tablet TAKE 1 TABLET BY MOUTH EVERY DAY 05/18/14  Yes Leonie Man, MD  metroNIDAZOLE (METROCREAM) 0.75 % cream Apply 1 application topically 2 (two) times daily. 04/17/13  Yes Historical Provider, MD  triamcinolone cream (KENALOG) 0.1 % Apply 1 application topically  as needed. 03/31/13  Yes Historical Provider, MD   Allergies  Allergen Reactions  . Nsaids     unknown  . Statins     Hurt all over  . Tricor [Fenofibrate]     Hurt all over.   [name] Zetia [Ezetimibe]     Hurt all over.     Social History   Social History  . Marital status: Widowed    Spouse name: N/A  . Number of children: 2  . Years of education: 16+   Occupational History  . retired    Social History Main Topics  . Smoking status: Never Smoker  . Smokeless tobacco: Never Used  . Alcohol use 0.5 oz/week    1 Standard drinks or equivalent per week     Comment: 1 glass of liquor weekly   . Drug use: No  . Sexual activity: Not Asked   Other Topics Concern  . None   Social History Narrative   She is a widowed, mother of 2 grandmother of 3.  Lives with granddaughter.   Drinks coffee every morning. 1 cup.   She used to do exercising through the [employer] program as well as the [employer]. Unfortunately, this finding of the classes for seniors changed, and she was unable to make the new classes. She did not like exercising with non-seniors. Besides that she does existing disease, walks up and down the stairs, walk her dog. She is not as active as he had been before.   She never smoked, never drank alcohol.          Family History  Problem Relation Age of Onset  . Alzheimer's disease Mother   . Alzheimer's disease Sister   . Hyperlipidemia Sister   . Hypertension Sister   . Diabetes Sister   . Hyperlipidemia Sister   . Hypertension Sister   . Pancreatitis Child    Wt Readings from Last 3 Encounters:  02/16/16 60.8 kg (134 lb)  01/27/16 59.1 kg (130 lb 3.2 oz)  01/21/16 64.4 kg (141 lb 15.6 oz)    PHYSICAL EXAM BP (!) 142/80   Pulse 75   Ht 5\' 6"  (1.676 m)   Wt 60.8 kg (134 lb)   BMI 21.63 kg/m  General appearance: alert, cooperative, appears stated age and no distress  Neck: no adenopathy, no carotid bruit, no JVD, supple, symmetrical, trachea  midline and thyroid not enlarged, symmetric, no tenderness/mass/nodules  Lungs: clear to auscultation bilaterally and normal percussion bilaterally  Heart: regular rate and rhythm, S1, S2 normal, no murmur, click, rub or gallop and normal apical impulse  Abdomen: soft, non-tender; bowel sounds normal; no masses, no organomegaly  Extremities: extremities normal, atraumatic, no cyanosis or edema  Pulses: 2+ and symmetric  Neurologic: Grossly normal   Adult ECG Report  Rate: 75;  Rhythm: sinus arrhythmia, premature atrial contractions (PAC) and Voltage for LVH. Poor R-wave progression.;   Narrative Interpretation: Essentially stable EKG; PACs, more frequent/new  Other studies Reviewed: Additional studies/ records that were reviewed today include:  Recent Labs:  - Followed by PCP Lab Results  Component Value Date   CHOL 234 (H) 01/22/2016   HDL 63 01/22/2016   LDLCALC 151 (H) 01/22/2016   TRIG 98 01/22/2016   CHOLHDL 3.7 01/22/2016    ASSESSMENT / PLAN: Problem List Items Addressed This Visit    Statin intolerance (Chronic)   Relevant Orders   EKG 12-Lead (Completed)   Ambulatory referral to Cardiology   Neurological deficit present   Hyperlipidemia LDL goal <70 (Chronic)    Lipids are well out of control. Has not done well on several statins in the past.  Now, especially in light of her recent TIA/stroke, we need to be more aggressive lipid management.  PLAN: Refer to [facility] ([facility]) -> consider PCSK-9 Inibitotr      Relevant Orders   EKG 12-Lead (Completed)   Ambulatory referral to Cardiology   Essential hypertension (Chronic)    Upper limit of normal blood pressure today. Her PCP increased lisinopril to 20 mg when last seen. The next recommendation for blood pressure would be to increase atenolol versus switch atenolol to carvedilol.      Relevant Orders   EKG 12-Lead (Completed)   Ambulatory referral to Cardiology   Cerebellar infarct [facility])    Noted on  MRI. She has had several episodes of thought to be maybe TIAs, and by MRI. To be probably subacute cerebellar infarcts. No clear etiology is found. Recommendation from neurology was to refer to electrophysiology for  loop recorder. In light of the fact that we don't really have a true etiology and she has frequent PACs that are asymptomatic, I think is not unreasonable to exclude atrial fibrillation. Plan: Refer to EP for Loop Recorder recommendation.      Relevant Orders   EKG 12-Lead (Completed)   Ambulatory referral to Cardiology   CAD (coronary artery disease), autologous vein bypass graft - Primary (Chronic)    No active anginal symptoms.  She had a false positive stress test in 2015, therefore be reluctant to pursue another Myoview in the future. Would consider stress echo although this less likely to be helpful post CABG patient. On aspirin, beta blocker and ACE inhibitor. Not on statin currently.  We'll need to consider lipid management as she is statin intolerant.      Relevant Orders   EKG 12-Lead (Completed)   Ambulatory referral to Cardiology    Other Visit Diagnoses   None.     Current medicines are reviewed at length with the patient today. (+/- concerns) preoperative evaluation for hip procedure. The following changes have been made: Continue with 20 mg lisinopril -- Hyperlipidemia, see [facility] to discuss PCSK9 -- Referred to EP for possible loop recorder  ROV 12 months  Studies Ordered:   Orders Placed This Encounter  Procedures  . Ambulatory referral to Cardiology  . EKG 12-Lead      Glenetta Hew, M.D., M.S. Interventional Cardiologist   Pager # [phone]

## 2016-02-17 ENCOUNTER — Encounter: Payer: Self-pay | Admitting: Cardiology

## 2016-02-17 NOTE — Assessment & Plan Note (Signed)
Upper limit of normal blood pressure today. Her PCP increased lisinopril to 20 mg when last seen. The next recommendation for blood pressure would be to increase atenolol versus switch atenolol to carvedilol.

## 2016-02-17 NOTE — Assessment & Plan Note (Signed)
No active anginal symptoms.  She had a false positive stress test in 2015, therefore be reluctant to pursue another Myoview in the future. Would consider stress echo although this less likely to be helpful post CABG patient. On aspirin, beta blocker and ACE inhibitor. Not on statin currently.  We'll need to consider lipid management as she is statin intolerant.

## 2016-02-17 NOTE — Assessment & Plan Note (Addendum)
Lipids are well out of control. Has not done well on several statins in the past.  Now, especially in light of her recent TIA/stroke, we need to be more aggressive lipid management.  PLAN: Refer to [facility] ([facility]) -> consider PCSK-9 Inibitotr

## 2016-02-17 NOTE — Assessment & Plan Note (Signed)
Noted on MRI. She has had several episodes of thought to be maybe TIAs, and by MRI. To be probably subacute cerebellar infarcts. No clear etiology is found. Recommendation from neurology was to refer to electrophysiology for loop recorder. In light of the fact that we don't really have a true etiology and she has frequent PACs that are asymptomatic, I think is not unreasonable to exclude atrial fibrillation. Plan: Refer to EP for Loop Recorder recommendation.

## 2016-02-18 ENCOUNTER — Ambulatory Visit (INDEPENDENT_AMBULATORY_CARE_PROVIDER_SITE_OTHER): Payer: Medicare Other | Admitting: Neurology

## 2016-02-18 DIAGNOSIS — R410 Disorientation, unspecified: Secondary | ICD-10-CM

## 2016-02-18 DIAGNOSIS — F05 Delirium due to known physiological condition: Principal | ICD-10-CM

## 2016-02-18 NOTE — Procedures (Signed)
     History: [name] is an 82 year old patient with a history of cerebrovascular disease and coronary artery disease. The patient has had an episode of speech arrest. MRI of the brain did not show acute changes. The patient is being evaluated for this event.  This is a routine EEG. No skull defects are noted. Medications include aspirin, Tenormin, Lumigan, Alphagan, Plavix, lisinopril, Robaxin, and oxycodone.  EEG classification: Dysrhythmia grade 1 generalized  Description of the recording: The background rhythms of this recording consists of a moderately well modulated medium amplitude 7 Hz background activity that is reactive to eye opening and closure. As record progresses, the background slowing seems to persist throughout the recording. Photic stimulation is performed and results in a minimal bilateral photic driving response. Hyperventilation was not performed. At no time during the recording does there appear to be evidence of spike or spike-wave discharges or evidence of focal slowing. EKG monitor shows no evidence of cardiac rhythm abnormalities with a heart rate of 72.  Impression: This is an abnormal EEG recording secondary to diffuse background theta frequency slowing. This is a nonspecific recording and can be seen with any process that results in an underlying toxic or metabolic encephalopathy or any dementing type illness. No definite epileptiform discharges were seen.

## 2016-02-21 ENCOUNTER — Telehealth: Payer: Self-pay | Admitting: *Deleted

## 2016-02-21 ENCOUNTER — Ambulatory Visit (INDEPENDENT_AMBULATORY_CARE_PROVIDER_SITE_OTHER): Payer: Medicare Other | Admitting: Internal Medicine

## 2016-02-21 VITALS — BP 150/80 | HR 67 | Ht 66.0 in | Wt 133.8 lb

## 2016-02-21 DIAGNOSIS — I639 Cerebral infarction, unspecified: Secondary | ICD-10-CM

## 2016-02-21 NOTE — Telephone Encounter (Signed)
Called and spoke to patient about EEG results per [name] note. Pt verbalized understanding.

## 2016-02-21 NOTE — Telephone Encounter (Signed)
-----   Message from Melvenia Beam, MD sent at 02/21/2016 11:30 AM EDT ----- No epileptiform activity was seen or predisposition for seizures thanks

## 2016-02-21 NOTE — Patient Instructions (Signed)
Medication Instructions:  Your physician recommends that you continue on your current medications as directed. Please refer to the Current Medication list given to you today.   Labwork: None ordered   Testing/Procedures: None ordered   Follow-Up: Your physician recommends that you schedule a follow-up appointment as needed   Any Other Special Instructions Will Be Listed Below (If Applicable).     Call back if you decided to proceed with monitor or LINQ implant

## 2016-02-21 NOTE — Progress Notes (Signed)
ELECTROPHYSIOLOGY CONSULT NOTE  Patient ID: [name] MRN: [id number], DOB/AGE: 1931-05-07   Primary Physician: [name], MD Primary Cardiologist: Dr Ellyn Hack Reason for Consultation: Cryptogenic stroke; recommendations regarding Implantable Loop Recorder  History of Present Illness [name] was admitted in September with TIA.  MRI has revealed prior stroke.  she has been monitored on telemetry while in the hospital which has demonstrated no arrhythmias. No cause has been identified. She is followed by [name] with neurology. EP has been asked to evaluate for placement of an implantable loop recorder to monitor for atrial fibrillation.  Past Medical History Past Medical History:  Diagnosis Date  . Arthritis   . Bursitis    hips bilat   . CAD (coronary artery disease), autologous vein bypass graft March 2008   Follow-up cath: December 2015 Occluded SVG-D1 and occluded SVG-RI.  [name] CAD in native artery   . Dyslipidemia    Statin intolerant  . Glaucoma   . Hypertension   . Non-STEMI (non-ST elevated myocardial infarction) [facility]) October 2005   EF by 35-40%, echo 40-50%. Angiography: 99% mid LAD involving D1 followed by 70% mid LAD; 80% RI. --> CABG  . PONV (postoperative nausea and vomiting)   . S/P CABG x 01 February 2004   LIMA-LAD, SVG-D1, SVG-RI.  [name] Statin intolerance     Past Surgical History Past Surgical History:  Procedure Laterality Date  . BUNIONECTOMY     2002  . CARDIOVASCULAR STRESS TEST  05/23/2006   Mild lateral/inferolateral ischemia, likely due to occluded SVGs with exhisting disease.  [name] CAROTID DOPPLER  07/15/2009   Bilat ICAs 0-49% diameter reduction. Normal patency of Bilat subclavian arteries.  . CORONARY ARTERY BYPASS GRAFT  02/22/2004   x3. LIMA to distal LAD, SVG to first diag, SVG to ramus. SVG harvest from rt thigh.  [name] EXCISION/RELEASE BURSA HIP Right 06/15/2015   Procedure: RIGHT HIP BURSECTOMY WITH GLUTEAL TENDON REPAIR;   Surgeon: Gaynelle Arabian, MD;  Location: [facility];  Service: Orthopedics;  Laterality: Right;  . EYE SURGERY     cataract surgery bilat   . LEFT HEART CATHETERIZATION WITH CORONARY ANGIOGRAM  02/19/2004   Significant 2 vessel CAD - LAD, D1 and RI  . LEFT HEART CATHETERIZATION WITH CORONARY/GRAFT ANGIOGRAM N/A 04/28/2014   Procedure: LEFT HEART CATHETERIZATION WITH Beatrix Fetters;  Surgeon: Sinclair Grooms, MD;  Location[facility] CATH LAB: CTO of SVG-Diag & SVG-RI, patent LIMA-LAD. Patent native circumflex, LAD and RCA. 70% stenosis in a branch of RI. --> Does not explain "high risk perfusion study "  . LEFT HEART CATHETERIZATION WITH CORONARY/GRAFT ANGIOGRAM   07/25/2006   Totally occluded SVG to diag and ramus. Patent LIMA-LAD. Ramus 70-80% proximal stenosis and occluded vein graft.  [name] left total knee replacement      2001  . NM MYOVIEW LTD  January 2008; December 2015   a. Referred for For mild inferolateral and anterolateral/apical lateral defect with mild reversibility.;; b. Large defect in the anterior and inferior wall. Suggestive of potential infarct plus ischemia. HIGH RISK.  . right total knee replacement      2003  . TRANSTHORACIC ECHOCARDIOGRAM  02/19/2004; December 2015   a. EF 45-50%, Normal LV function, moderate hypokinesis of anterior wall.;; b. EF 50-55%. No RWMA, GR 1 DD. Normal valves     Allergies/Intolerances Allergies  Allergen Reactions  . Nsaids     unknown  . Statins     Hurt all over  . Tricor [Fenofibrate]  Hurt all over.   [name] Zetia [Ezetimibe]     Hurt all over.    Inpatient Medications   Social History Social History   Social History  . Marital status: Widowed    Spouse name: N/A  . Number of children: 2  . Years of education: 16+   Occupational History  . retired    Social History Main Topics  . Smoking status: Never Smoker  . Smokeless tobacco: Never Used  . Alcohol use 0.5 oz/week    1 Standard drinks or equivalent per week      Comment: 1 glass of liquor weekly   . Drug use: No  . Sexual activity: Not on file   Other Topics Concern  . Not on file   Social History Narrative   She is a widowed, mother of 2 grandmother of 3.    Lives with granddaughter.   Drinks coffee every morning. 1 cup.   She used to do exercising through the [employer] program as well as the [employer]. Unfortunately, this finding of the classes for seniors changed, and she was unable to make the new classes. She did not like exercising with non-seniors. Besides that she does existing disease, walks up and down the stairs, walk her dog. She is not as active as he had been before.   She never smoked, never drank alcohol.          Review of Systems General: No chills, fever, night sweats or weight changes  Cardiovascular:  No chest pain, dyspnea on exertion, edema, orthopnea, palpitations, paroxysmal nocturnal dyspnea Dermatological: No rash, lesions or masses Respiratory: No cough, dyspnea Urologic: No hematuria, dysuria Abdominal: No nausea, vomiting, diarrhea, bright red blood per rectum, melena, or hematemesis Neurologic: No visual changes, weakness, changes in mental status All other systems reviewed and are otherwise negative except as noted above.  Physical Exam Blood pressure (!) 150/80, pulse 67, height 5\' 6"  (1.676 m), weight 133 lb 12.8 oz (60.7 kg).  General: Well developed, well appearing 82 y.o. female in no acute distress. HEENT: Normocephalic, atraumatic. EOMs intact. Sclera nonicteric. Oropharynx clear.  Neck: Supple without bruits. No JVD. Lungs: Respirations regular and unlabored, CTA bilaterally. No wheezes, rales or rhonchi. Heart: RRR. S1, S2 present. No murmurs, rub, S3 or S4. Abdomen: Soft, non-tender, non-distended. BS present x 4 quadrants. No hepatosplenomegaly.  Extremities: No clubbing, cyanosis or edema. DP/PT/Radials 2+ and equal bilaterally. Psych: Normal affect. Neuro: Alert and oriented X 3. Moves all  extremities spontaneously. Musculoskeletal: No kyphosis. Skin: Intact. Warm and dry. No rashes or petechiae in exposed areas.   Labs Lab Results  Component Value Date   WBC 4.9 01/22/2016   HGB 12.6 01/22/2016   HCT 38.4 01/22/2016   MCV 94.6 01/22/2016   PLT 231 01/22/2016   No results for input(s): NA, K, CL, CO2, BUN, CREATININE, CALCIUM, PROT, BILITOT, ALKPHOS, ALT, AST, GLUCOSE in the last 168 hours.  Invalid input(s): LABALBU No results for input(s): INR in the last 72 hours.  Echocardiogram  Reviewed,  EF is normal  12-lead ECG all ekgs back to 2015 are reviewed in epic and do not reveal afib Telemetry scanned strips in epic are reviewed   Assessment and Plan 1. Cryptogenic stroke  The indication for loop recorder insertion / monitoring for AF in setting of cryptogenic stroke was discussed with the patient. The loop recorder insertion procedure was reviewed in detail including risks and benefits. These risks include but are not limited to bleeding  and infection. The patient expressed verbal understanding and wishes to think about this further.  We discussed 30 day monitor as an alternative.  She will think about 30 day monitor vs LINQ and will contact my office if she decides to proceed.   Army Fossa 02/21/2016, 11:11 AM

## 2016-02-23 ENCOUNTER — Institutional Professional Consult (permissible substitution): Payer: Medicare Other | Admitting: Internal Medicine

## 2016-03-02 ENCOUNTER — Emergency Department (HOSPITAL_COMMUNITY): Payer: Medicare Other

## 2016-03-02 ENCOUNTER — Emergency Department (HOSPITAL_COMMUNITY)
Admission: EM | Admit: 2016-03-02 | Discharge: 2016-03-02 | Disposition: A | Discharge status: Home / Self Care | Payer: Medicare Other | Attending: Emergency Medicine | Admitting: Emergency Medicine

## 2016-03-02 ENCOUNTER — Telehealth: Payer: Self-pay | Admitting: Neurology

## 2016-03-02 ENCOUNTER — Encounter (HOSPITAL_COMMUNITY): Payer: Self-pay

## 2016-03-02 ENCOUNTER — Encounter (HOSPITAL_COMMUNITY)
Admission: EM | Disposition: A | Discharge status: Home / Self Care | Payer: Self-pay | Source: Home / Self Care | Attending: Emergency Medicine

## 2016-03-02 ENCOUNTER — Ambulatory Visit: Payer: Medicare Other

## 2016-03-02 DIAGNOSIS — H547 Unspecified visual loss: Secondary | ICD-10-CM | POA: Diagnosis not present

## 2016-03-02 DIAGNOSIS — Z96653 Presence of artificial knee joint, bilateral: Secondary | ICD-10-CM | POA: Diagnosis not present

## 2016-03-02 DIAGNOSIS — G458 Other transient cerebral ischemic attacks and related syndromes: Secondary | ICD-10-CM

## 2016-03-02 DIAGNOSIS — I10 Essential (primary) hypertension: Secondary | ICD-10-CM | POA: Diagnosis not present

## 2016-03-02 DIAGNOSIS — Z7902 Long term (current) use of antithrombotics/antiplatelets: Secondary | ICD-10-CM | POA: Diagnosis not present

## 2016-03-02 DIAGNOSIS — I252 Old myocardial infarction: Secondary | ICD-10-CM | POA: Insufficient documentation

## 2016-03-02 DIAGNOSIS — R4701 Aphasia: Secondary | ICD-10-CM | POA: Diagnosis not present

## 2016-03-02 DIAGNOSIS — H409 Unspecified glaucoma: Secondary | ICD-10-CM | POA: Diagnosis not present

## 2016-03-02 DIAGNOSIS — I2510 Atherosclerotic heart disease of native coronary artery without angina pectoris: Secondary | ICD-10-CM | POA: Diagnosis not present

## 2016-03-02 DIAGNOSIS — Z79899 Other long term (current) drug therapy: Secondary | ICD-10-CM | POA: Insufficient documentation

## 2016-03-02 DIAGNOSIS — I639 Cerebral infarction, unspecified: Secondary | ICD-10-CM

## 2016-03-02 DIAGNOSIS — R404 Transient alteration of awareness: Secondary | ICD-10-CM | POA: Diagnosis not present

## 2016-03-02 DIAGNOSIS — E785 Hyperlipidemia, unspecified: Secondary | ICD-10-CM | POA: Diagnosis not present

## 2016-03-02 DIAGNOSIS — Z7982 Long term (current) use of aspirin: Secondary | ICD-10-CM | POA: Diagnosis not present

## 2016-03-02 DIAGNOSIS — Z951 Presence of aortocoronary bypass graft: Secondary | ICD-10-CM | POA: Insufficient documentation

## 2016-03-02 DIAGNOSIS — R531 Weakness: Secondary | ICD-10-CM | POA: Diagnosis not present

## 2016-03-02 DIAGNOSIS — R42 Dizziness and giddiness: Secondary | ICD-10-CM | POA: Diagnosis not present

## 2016-03-02 DIAGNOSIS — Z8673 Personal history of transient ischemic attack (TIA), and cerebral infarction without residual deficits: Secondary | ICD-10-CM | POA: Diagnosis not present

## 2016-03-02 DIAGNOSIS — G459 Transient cerebral ischemic attack, unspecified: Secondary | ICD-10-CM | POA: Diagnosis present

## 2016-03-02 HISTORY — PX: EP IMPLANTABLE DEVICE: SHX172B

## 2016-03-02 LAB — I-STAT CHEM 8, ED
BUN: 15 mg/dL (ref 6–20)
CALCIUM ION: 1.15 mmol/L (ref 1.15–1.40)
CHLORIDE: 107 mmol/L (ref 101–111)
CREATININE: 0.9 mg/dL (ref 0.44–1.00)
GLUCOSE: 100 mg/dL — AB (ref 65–99)
HCT: 40 % (ref 36.0–46.0)
Hemoglobin: 13.6 g/dL (ref 12.0–15.0)
POTASSIUM: 3.3 mmol/L — AB (ref 3.5–5.1)
Sodium: 144 mmol/L (ref 135–145)
TCO2: 23 mmol/L (ref 0–100)

## 2016-03-02 LAB — COMPREHENSIVE METABOLIC PANEL
ALBUMIN: 3.9 g/dL (ref 3.5–5.0)
ALK PHOS: 66 U/L (ref 38–126)
ALT: 13 U/L — AB (ref 14–54)
AST: 21 U/L (ref 15–41)
Anion gap: 9 (ref 5–15)
BILIRUBIN TOTAL: 0.9 mg/dL (ref 0.3–1.2)
BUN: 13 mg/dL (ref 6–20)
CALCIUM: 9.7 mg/dL (ref 8.9–10.3)
CO2: 25 mmol/L (ref 22–32)
CREATININE: 0.88 mg/dL (ref 0.44–1.00)
Chloride: 109 mmol/L (ref 101–111)
GFR calc Af Amer: >60 mL/min (ref >60)
GFR calc non Af Amer: 60 mL/min — ABNORMAL LOW (ref >60)
GLUCOSE: 104 mg/dL — AB (ref 65–99)
Potassium: 3.2 mmol/L — ABNORMAL LOW (ref 3.5–5.1)
SODIUM: 143 mmol/L (ref 135–145)
TOTAL PROTEIN: 6.5 g/dL (ref 6.5–8.1)

## 2016-03-02 LAB — DIFFERENTIAL
Basophils Absolute: 0 10*3/uL (ref 0.0–0.1)
Basophils Relative: 0 %
EOS PCT: 1 %
Eosinophils Absolute: 0.1 10*3/uL (ref 0.0–0.7)
LYMPHS ABS: 1.7 10*3/uL (ref 0.7–4.0)
LYMPHS PCT: 34 %
Monocytes Absolute: 0.4 10*3/uL (ref 0.1–1.0)
Monocytes Relative: 8 %
NEUTROS ABS: 2.8 10*3/uL (ref 1.7–7.7)
NEUTROS PCT: 57 %

## 2016-03-02 LAB — URINALYSIS, ROUTINE W REFLEX MICROSCOPIC
BILIRUBIN URINE: NEGATIVE
Glucose, UA: NEGATIVE mg/dL
HGB URINE DIPSTICK: NEGATIVE
Ketones, ur: NEGATIVE mg/dL
Nitrite: NEGATIVE
PH: 5.5 (ref 5.0–8.0)
Protein, ur: NEGATIVE mg/dL
SPECIFIC GRAVITY, URINE: 1.007 (ref 1.005–1.030)

## 2016-03-02 LAB — CBC
HCT: 40.1 % (ref 36.0–46.0)
HEMOGLOBIN: 13.5 g/dL (ref 12.0–15.0)
MCH: 31 pg (ref 26.0–34.0)
MCHC: 33.7 g/dL (ref 30.0–36.0)
MCV: 92.2 fL (ref 78.0–100.0)
PLATELETS: 211 10*3/uL (ref 150–400)
RBC: 4.35 MIL/uL (ref 3.87–5.11)
RDW: 13.2 % (ref 11.5–15.5)
WBC: 4.9 10*3/uL (ref 4.0–10.5)

## 2016-03-02 LAB — CBG MONITORING, ED: Glucose-Capillary: 89 mg/dL (ref 65–99)

## 2016-03-02 LAB — I-STAT TROPONIN, ED: Troponin i, poc: 0.01 ng/mL (ref 0.00–0.08)

## 2016-03-02 LAB — PROTIME-INR
INR: 1.02
PROTHROMBIN TIME: 13.4 s (ref 11.4–15.2)

## 2016-03-02 LAB — URINE MICROSCOPIC-ADD ON
BACTERIA UA: NONE SEEN
RBC / HPF: NONE SEEN RBC/hpf (ref 0–5)

## 2016-03-02 LAB — APTT: aPTT: 26 seconds (ref 24–36)

## 2016-03-02 SURGERY — LOOP RECORDER INSERTION
Anesthesia: LOCAL

## 2016-03-02 MED ORDER — LIDOCAINE-EPINEPHRINE 1 %-1:100000 IJ SOLN
INTRAMUSCULAR | Status: AC
  Filled 2016-03-02: qty 1

## 2016-03-02 MED ORDER — LIDOCAINE-EPINEPHRINE 1 %-1:100000 IJ SOLN
INTRAMUSCULAR | Status: DC | PRN
  Administered 2016-03-02: 20 mL

## 2016-03-02 SURGICAL SUPPLY — 2 items
LOOP REVEAL LINQSYS (Prosthesis & Implant Heart) ×2 IMPLANT
PACK LOOP INSERTION (CUSTOM PROCEDURE TRAY) ×2 IMPLANT

## 2016-03-02 NOTE — ED Triage Notes (Signed)
Pt. Has a history of a TIA 4 weeks ago and she sts she was having trouble getting her words this morning and loss of vision and she called EMS. A/Ox4 upon arrival and symptoms resolved. Neuro exam normal. Pupils equal and reactive.

## 2016-03-02 NOTE — ED Notes (Signed)
Patient transported to CT 

## 2016-03-02 NOTE — H&P (View-Only) (Signed)
ELECTROPHYSIOLOGY CONSULT NOTE    Patient ID: [name] MRN: [id number], DOB/AGE: 1931-08-12 82 y.o.  Admit date: 03/02/2016 Date of Consult: 03/02/2016  Primary Physician: [name], MD Primary Cardiologist: Ellyn Hack Electrophysiologist: Rayann Heman Requesting MD: ER  Reason for Consultation: ?AF on EKG   HPI:  [name] is a 82 y.o. female with a past medical history significant for TIA, CAD s/p CABG, hypertension.  She was recently seen by Dr Rayann Heman in the office for evaluation of ILR for cryptogenic stroke. She wanted to think about her options at that time and was worried about getting to the hospital in the dark at [facility].  She presented to the ER today with expressive aphasia and loss of vision.  EKG done was read by computer as atrial fibrillation.  EP has been asked to evaluate for treatment options.  Review of EKG's and telemetry in the ER demonstrate all sinus rhythm with some baseline tremors in some leads. V1 is very clearly sinus rhythm in all tracings.  As the patient has had recurrent TIA without cause identified, I have again recommended ILR implant.  She would like to proceed while here.  Echo 12/2015 demonstrated EF [id number], grade 2 diastolic dysfunction, LA 41.    She currently states that her symptoms of TIA have resolved.  She has not had chest pain, shortness of breath, LE edema, recent fevers, chills, nausea or vomiting.   Past Medical History:  Diagnosis Date  . Arthritis   . Bursitis    hips bilat   . CAD (coronary artery disease), autologous vein bypass graft March 2008   Follow-up cath: December 2015 Occluded SVG-D1 and occluded SVG-RI.  [name] CAD in native artery   . Dyslipidemia    Statin intolerant  . Glaucoma   . Hypertension   . Non-STEMI (non-ST elevated myocardial infarction) [facility]) October 2005   EF by 35-40%, echo 40-50%. Angiography: 99% mid LAD involving D1 followed by 70% mid LAD; 80% RI. --> CABG  . PONV (postoperative nausea and  vomiting)   . S/P CABG x 01 February 2004   LIMA-LAD, SVG-D1, SVG-RI.  [name] Statin intolerance      Surgical History:  Past Surgical History:  Procedure Laterality Date  . BUNIONECTOMY     2002  . CARDIOVASCULAR STRESS TEST  05/23/2006   Mild lateral/inferolateral ischemia, likely due to occluded SVGs with exhisting disease.  [name] CAROTID DOPPLER  07/15/2009   Bilat ICAs 0-49% diameter reduction. Normal patency of Bilat subclavian arteries.  . CORONARY ARTERY BYPASS GRAFT  02/22/2004   x3. LIMA to distal LAD, SVG to first diag, SVG to ramus. SVG harvest from rt thigh.  [name] EXCISION/RELEASE BURSA HIP Right 06/15/2015   Procedure: RIGHT HIP BURSECTOMY WITH GLUTEAL TENDON REPAIR;  Surgeon: Gaynelle Arabian, MD;  Location: [facility];  Service: Orthopedics;  Laterality: Right;  . EYE SURGERY     cataract surgery bilat   . LEFT HEART CATHETERIZATION WITH CORONARY ANGIOGRAM  02/19/2004   Significant 2 vessel CAD - LAD, D1 and RI  . LEFT HEART CATHETERIZATION WITH CORONARY/GRAFT ANGIOGRAM N/A 04/28/2014   Procedure: LEFT HEART CATHETERIZATION WITH Beatrix Fetters;  Surgeon: Sinclair Grooms, MD;  Location: [facility] CATH LAB: CTO of SVG-Diag & SVG-RI, patent LIMA-LAD. Patent native circumflex, LAD and RCA. 70% stenosis in a branch of RI. --> Does not explain "high risk perfusion study "  . LEFT HEART CATHETERIZATION WITH CORONARY/GRAFT ANGIOGRAM   07/25/2006   Totally occluded SVG to diag  and ramus. Patent LIMA-LAD. Ramus 70-80% proximal stenosis and occluded vein graft.  [name] left total knee replacement      2001  . NM MYOVIEW LTD  January 2008; December 2015   a. Referred for For mild inferolateral and anterolateral/apical lateral defect with mild reversibility.;; b. Large defect in the anterior and inferior wall. Suggestive of potential infarct plus ischemia. HIGH RISK.  . right total knee replacement      2003  . TRANSTHORACIC ECHOCARDIOGRAM  02/19/2004; December 2015   a. EF 45-50%, Normal LV function,  moderate hypokinesis of anterior wall.;; b. EF 50-55%. No RWMA, GR 1 DD. Normal valves       (Not in a hospital admission)  Inpatient Medications:   Allergies:  Allergies  Allergen Reactions  . Nsaids Other (See Comments)    Make patient unable to urinate  . Statins Other (See Comments)    HURTS ALL OVER  . Tricor [Fenofibrate] Other (See Comments)    HURTS ALL OVER  . Zetia [Ezetimibe] Other (See Comments)    Hurt all over.     Social History   Social History  . Marital status: Widowed    Spouse name: N/A  . Number of children: 2  . Years of education: 16+   Occupational History  . retired    Social History Main Topics  . Smoking status: Never Smoker  . Smokeless tobacco: Never Used  . Alcohol use 0.5 oz/week    1 Standard drinks or equivalent per week     Comment: 1 glass of liquor weekly   . Drug use: No  . Sexual activity: Not on file   Other Topics Concern  . Not on file   Social History Narrative   She is a widowed, mother of 2 grandmother of 3.    Lives with granddaughter.   Drinks coffee every morning. 1 cup.   She used to do exercising through the [employer] program as well as the [employer]. Unfortunately, this finding of the classes for seniors changed, and she was unable to make the new classes. She did not like exercising with non-seniors. Besides that she does existing disease, walks up and down the stairs, walk her dog. She is not as active as he had been before.   She never smoked, never drank alcohol.           Family History  Problem Relation Age of Onset  . Alzheimer's disease Mother   . Alzheimer's disease Sister   . Hyperlipidemia Sister   . Hypertension Sister   . Diabetes Sister   . Hyperlipidemia Sister   . Hypertension Sister   . Pancreatitis Child      Review of Systems: All other systems reviewed and are otherwise negative except as noted above.  Physical Exam: Vitals:   03/02/16 1245 03/02/16 1300 03/02/16 1315  03/02/16 1330  BP: 169/80 155/64 169/74 167/70  Pulse: 75 73 79 78  Resp: 15 18 22 16   Temp:      TempSrc:      SpO2: 97% 96% 97% 98%    GEN- The patient is elderly and thin appearing, alert and oriented x 3 today.   HEENT: normocephalic, atraumatic; sclera clear, conjunctiva pink; hearing intact; oropharynx clear; neck supple  Lungs- Clear to ausculation bilaterally, normal work of breathing.  No wheezes, rales, rhonchi Heart- Regular rate and rhythm  GI- soft, non-tender, non-distended, bowel sounds present  Extremities- no clubbing, cyanosis, or edema; DP/PT/radial pulses 2+ bilaterally  MS- age appropriate atrophy,+bilateral hand contractures Skin- warm and dry, no rash or lesion Psych- euthymic mood, full affect Neuro- strength and sensation are intact, resting intermittent tremor  Labs:   Lab Results  Component Value Date   WBC 4.9 03/02/2016   HGB 13.6 03/02/2016   HCT 40.0 03/02/2016   MCV 92.2 03/02/2016   PLT 211 03/02/2016    Recent Labs Lab 03/02/16 1150 03/02/16 1207  NA 143 144  K 3.2* 3.3*  CL 109 107  CO2 25  --   BUN 13 15  CREATININE 0.88 0.90  CALCIUM 9.7  --   PROT 6.5  --   BILITOT 0.9  --   ALKPHOS 66  --   ALT 13*  --   AST 21  --   GLUCOSE 104* 100*      Radiology/Studies: Ct Head Wo Contrast Result Date: 03/02/2016 CLINICAL DATA:  TA 4 weeks ago, trouble getting words out this morning and loss of vision, symptoms resolved following presentation to the ED, history hypertension, coronary artery disease post nonSTEMI and CABG, dyslipidemia EXAM: CT HEAD WITHOUT CONTRAST TECHNIQUE: Contiguous axial images were obtained from the base of the skull through the vertex without intravenous contrast. COMPARISON:  01/21/2016 FINDINGS: Brain: Mild atrophy. Normal ventricular morphology. No midline shift or mass effect. Small vessel chronic ischemic changes of deep cerebral white matter. Small old BILATERAL cerebellar infarcts again identified. No  intracranial hemorrhage, mass lesion, evidence acute infarction, or extra-axial fluid collection. Vascular: Atherosclerotic calcifications at the carotid siphons and minimally in RIGHT vertebral artery Skull: Intact Sinuses/Orbits: Clear Other: N/A IMPRESSION: Atrophy with small vessel chronic ischemic changes of deep cerebral white matter. Small BILATERAL cerebellar infarcts. No acute intracranial abnormalities. Electronically Signed   By: Lavonia Dana M.D.   On: 03/02/2016 12:28   Mr Brain Wo Contrast Result Date: 03/02/2016 CLINICAL DATA:  82 year old hypertensive female. Trouble finding words/ loss of vision. Subsequent encounter. EXAM: MRI HEAD WITHOUT CONTRAST TECHNIQUE: Multiplanar, multiecho pulse sequences of the brain and surrounding structures were obtained without intravenous contrast. COMPARISON:  03/02/2016 CT.  01/21/2016 MR. FINDINGS: Brain: No acute infarct or intracranial hemorrhage. Remote bilateral cerebellar infarcts. Moderate chronic microvascular changes. Mild global atrophy without hydrocephalus. No intracranial mass lesion noted on this unenhanced exam. Vascular: Major intracranial vascular structures are patent. MR angiogram detected aneurysms not adequately assessed on present exam. Skull and upper cervical spine: Transverse ligament hypertrophy. No cord compression. Sinuses/Orbits: Post lens replacement. No acute orbital abnormality. Minimal ethmoid sinus mucosal thickening. Other: Negative IMPRESSION: No acute infarct or intracranial hemorrhage. Remote bilateral cerebellar infarcts. Moderate chronic microvascular changes. Electronically Signed   By: Genia Del M.D.   On: 03/02/2016 14:58    [id number] rhythm  TELEMETRY: sinus rhythm  Assessment/Plan: 1.  Cryptogenic TIA's The patient has had cryptogenic TIA's with no documented atrial fibrillation.  I have recommended ILR implant at this time. She would like to proceed. Risks, benefits reviewed with the patient and her friend  who wish to proceed.  Will plan for later today.   Dr Rayann Heman to see later today.    Signed, Chanetta Marshall, NP 03/02/2016 3:55 PM   I have seen, examined the patient, and reviewed the above assessment and plan.  On exam, RRR.  Though ED EKG is read as afib, it is clearly sinus rhythm.  I do not see any afib documented.  ED staff feels patient is safe for discharge to home with outpatient neurology follow-up. Changes to above are made  where necessary.   Risks of ILR discussed with the patient who wishes to proceed.  I will implant prior to discharge. I have spoken with [name] (pts primary neurologist).  As pt is currently on ASA and plavix, no indication for changes at this time.  [name] will follow up with her in the office as well.  Co Sign: Thompson Grayer, MD 03/02/2016 5:00 PM

## 2016-03-02 NOTE — Consult Note (Signed)
ELECTROPHYSIOLOGY CONSULT NOTE    Patient ID: [name] MRN: [id number], DOB/AGE: 80-18-1933 80 y.o.  Admit date: 03/02/2016 Date of Consult: 03/02/2016  Primary Physician: [name], MD Primary Cardiologist: Ellyn Hack Electrophysiologist: Rayann Heman Requesting MD: ER  Reason for Consultation: ?AF on EKG   HPI:  [name] is a 82 y.o. female with a past medical history significant for TIA, CAD s/p CABG, hypertension.  She was recently seen by Dr Rayann Heman in the office for evaluation of ILR for cryptogenic stroke. She wanted to think about her options at that time and was worried about getting to the hospital in the dark at [facility].  She presented to the ER today with expressive aphasia and loss of vision.  EKG done was read by computer as atrial fibrillation.  EP has been asked to evaluate for treatment options.  Review of EKG's and telemetry in the ER demonstrate all sinus rhythm with some baseline tremors in some leads. V1 is very clearly sinus rhythm in all tracings.  As the patient has had recurrent TIA without cause identified, I have again recommended ILR implant.  She would like to proceed while here.  Echo 12/2015 demonstrated EF [id number], grade 2 diastolic dysfunction, LA 41.    She currently states that her symptoms of TIA have resolved.  She has not had chest pain, shortness of breath, LE edema, recent fevers, chills, nausea or vomiting.   Past Medical History:  Diagnosis Date  . Arthritis   . Bursitis    hips bilat   . CAD (coronary artery disease), autologous vein bypass graft March 2008   Follow-up cath: December 2015 Occluded SVG-D1 and occluded SVG-RI.  [name] CAD in native artery   . Dyslipidemia    Statin intolerant  . Glaucoma   . Hypertension   . Non-STEMI (non-ST elevated myocardial infarction) [facility]) October 2005   EF by 35-40%, echo 40-50%. Angiography: 99% mid LAD involving D1 followed by 70% mid LAD; 80% RI. --> CABG  . PONV (postoperative nausea and  vomiting)   . S/P CABG x 01 February 2004   LIMA-LAD, SVG-D1, SVG-RI.  [name] Statin intolerance      Surgical History:  Past Surgical History:  Procedure Laterality Date  . BUNIONECTOMY     2002  . CARDIOVASCULAR STRESS TEST  05/23/2006   Mild lateral/inferolateral ischemia, likely due to occluded SVGs with exhisting disease.  [name] CAROTID DOPPLER  07/15/2009   Bilat ICAs 0-49% diameter reduction. Normal patency of Bilat subclavian arteries.  . CORONARY ARTERY BYPASS GRAFT  02/22/2004   x3. LIMA to distal LAD, SVG to first diag, SVG to ramus. SVG harvest from rt thigh.  [name] EXCISION/RELEASE BURSA HIP Right 06/15/2015   Procedure: RIGHT HIP BURSECTOMY WITH GLUTEAL TENDON REPAIR;  Surgeon: Gaynelle Arabian, MD;  Location: [facility];  Service: Orthopedics;  Laterality: Right;  . EYE SURGERY     cataract surgery bilat   . LEFT HEART CATHETERIZATION WITH CORONARY ANGIOGRAM  02/19/2004   Significant 2 vessel CAD - LAD, D1 and RI  . LEFT HEART CATHETERIZATION WITH CORONARY/GRAFT ANGIOGRAM N/A 04/28/2014   Procedure: LEFT HEART CATHETERIZATION WITH Beatrix Fetters;  Surgeon: Sinclair Grooms, MD;  Location: [facility] CATH LAB: CTO of SVG-Diag & SVG-RI, patent LIMA-LAD. Patent native circumflex, LAD and RCA. 70% stenosis in a branch of RI. --> Does not explain "high risk perfusion study "  . LEFT HEART CATHETERIZATION WITH CORONARY/GRAFT ANGIOGRAM   07/25/2006   Totally occluded SVG to diag  and ramus. Patent LIMA-LAD. Ramus 70-80% proximal stenosis and occluded vein graft.  [name] left total knee replacement      2001  . NM MYOVIEW LTD  January 2008; December 2015   a. Referred for For mild inferolateral and anterolateral/apical lateral defect with mild reversibility.;; b. Large defect in the anterior and inferior wall. Suggestive of potential infarct plus ischemia. HIGH RISK.  . right total knee replacement      2003  . TRANSTHORACIC ECHOCARDIOGRAM  02/19/2004; December 2015   a. EF 45-50%, Normal LV function,  moderate hypokinesis of anterior wall.;; b. EF 50-55%. No RWMA, GR 1 DD. Normal valves       (Not in a hospital admission)  Inpatient Medications:   Allergies:  Allergies  Allergen Reactions  . Nsaids Other (See Comments)    Make patient unable to urinate  . Statins Other (See Comments)    HURTS ALL OVER  . Tricor [Fenofibrate] Other (See Comments)    HURTS ALL OVER  . Zetia [Ezetimibe] Other (See Comments)    Hurt all over.     Social History   Social History  . Marital status: Widowed    Spouse name: N/A  . Number of children: 2  . Years of education: 16+   Occupational History  . retired    Social History Main Topics  . Smoking status: Never Smoker  . Smokeless tobacco: Never Used  . Alcohol use 0.5 oz/week    1 Standard drinks or equivalent per week     Comment: 1 glass of liquor weekly   . Drug use: No  . Sexual activity: Not on file   Other Topics Concern  . Not on file   Social History Narrative   She is a widowed, mother of 2 grandmother of 3.    Lives with granddaughter.   Drinks coffee every morning. 1 cup.   She used to do exercising through the [employer] program as well as the [employer]. Unfortunately, this finding of the classes for seniors changed, and she was unable to make the new classes. She did not like exercising with non-seniors. Besides that she does existing disease, walks up and down the stairs, walk her dog. She is not as active as he had been before.   She never smoked, never drank alcohol.           Family History  Problem Relation Age of Onset  . Alzheimer's disease Mother   . Alzheimer's disease Sister   . Hyperlipidemia Sister   . Hypertension Sister   . Diabetes Sister   . Hyperlipidemia Sister   . Hypertension Sister   . Pancreatitis Child      Review of Systems: All other systems reviewed and are otherwise negative except as noted above.  Physical Exam: Vitals:   03/02/16 1245 03/02/16 1300 03/02/16 1315  03/02/16 1330  BP: 169/80 155/64 169/74 167/70  Pulse: 75 73 79 78  Resp: 15 18 22 16   Temp:      TempSrc:      SpO2: 97% 96% 97% 98%    GEN- The patient is elderly and thin appearing, alert and oriented x 3 today.   HEENT: normocephalic, atraumatic; sclera clear, conjunctiva pink; hearing intact; oropharynx clear; neck supple  Lungs- Clear to ausculation bilaterally, normal work of breathing.  No wheezes, rales, rhonchi Heart- Regular rate and rhythm  GI- soft, non-tender, non-distended, bowel sounds present  Extremities- no clubbing, cyanosis, or edema; DP/PT/radial pulses 2+ bilaterally  MS- age appropriate atrophy,+bilateral hand contractures Skin- warm and dry, no rash or lesion Psych- euthymic mood, full affect Neuro- strength and sensation are intact, resting intermittent tremor  Labs:   Lab Results  Component Value Date   WBC 4.9 03/02/2016   HGB 13.6 03/02/2016   HCT 40.0 03/02/2016   MCV 92.2 03/02/2016   PLT 211 03/02/2016    Recent Labs Lab 03/02/16 1150 03/02/16 1207  NA 143 144  K 3.2* 3.3*  CL 109 107  CO2 25  --   BUN 13 15  CREATININE 0.88 0.90  CALCIUM 9.7  --   PROT 6.5  --   BILITOT 0.9  --   ALKPHOS 66  --   ALT 13*  --   AST 21  --   GLUCOSE 104* 100*      Radiology/Studies: Ct Head Wo Contrast Result Date: 03/02/2016 CLINICAL DATA:  TA 4 weeks ago, trouble getting words out this morning and loss of vision, symptoms resolved following presentation to the ED, history hypertension, coronary artery disease post nonSTEMI and CABG, dyslipidemia EXAM: CT HEAD WITHOUT CONTRAST TECHNIQUE: Contiguous axial images were obtained from the base of the skull through the vertex without intravenous contrast. COMPARISON:  01/21/2016 FINDINGS: Brain: Mild atrophy. Normal ventricular morphology. No midline shift or mass effect. Small vessel chronic ischemic changes of deep cerebral white matter. Small old BILATERAL cerebellar infarcts again identified. No  intracranial hemorrhage, mass lesion, evidence acute infarction, or extra-axial fluid collection. Vascular: Atherosclerotic calcifications at the carotid siphons and minimally in RIGHT vertebral artery Skull: Intact Sinuses/Orbits: Clear Other: N/A IMPRESSION: Atrophy with small vessel chronic ischemic changes of deep cerebral white matter. Small BILATERAL cerebellar infarcts. No acute intracranial abnormalities. Electronically Signed   By: Lavonia Dana M.D.   On: 03/02/2016 12:28   Mr Brain Wo Contrast Result Date: 03/02/2016 CLINICAL DATA:  82 year old hypertensive female. Trouble finding words/ loss of vision. Subsequent encounter. EXAM: MRI HEAD WITHOUT CONTRAST TECHNIQUE: Multiplanar, multiecho pulse sequences of the brain and surrounding structures were obtained without intravenous contrast. COMPARISON:  03/02/2016 CT.  01/21/2016 MR. FINDINGS: Brain: No acute infarct or intracranial hemorrhage. Remote bilateral cerebellar infarcts. Moderate chronic microvascular changes. Mild global atrophy without hydrocephalus. No intracranial mass lesion noted on this unenhanced exam. Vascular: Major intracranial vascular structures are patent. MR angiogram detected aneurysms not adequately assessed on present exam. Skull and upper cervical spine: Transverse ligament hypertrophy. No cord compression. Sinuses/Orbits: Post lens replacement. No acute orbital abnormality. Minimal ethmoid sinus mucosal thickening. Other: Negative IMPRESSION: No acute infarct or intracranial hemorrhage. Remote bilateral cerebellar infarcts. Moderate chronic microvascular changes. Electronically Signed   By: Genia Del M.D.   On: 03/02/2016 14:58    [id number] rhythm  TELEMETRY: sinus rhythm  Assessment/Plan: 1.  Cryptogenic TIA's The patient has had cryptogenic TIA's with no documented atrial fibrillation.  I have recommended ILR implant at this time. She would like to proceed. Risks, benefits reviewed with the patient and her friend  who wish to proceed.  Will plan for later today.   Dr Rayann Heman to see later today.    Signed, Chanetta Marshall, NP 03/02/2016 3:55 PM   I have seen, examined the patient, and reviewed the above assessment and plan.  On exam, RRR.  Though ED EKG is read as afib, it is clearly sinus rhythm.  I do not see any afib documented.  ED staff feels patient is safe for discharge to home with outpatient neurology follow-up. Changes to above are made  where necessary.   Risks of ILR discussed with the patient who wishes to proceed.  I will implant prior to discharge. I have spoken with [name] (pts primary neurologist).  As pt is currently on ASA and plavix, no indication for changes at this time.  [name] will follow up with her in the office as well.  Co Sign: Thompson Grayer, MD 03/02/2016 5:00 PM

## 2016-03-02 NOTE — ED Provider Notes (Signed)
[facility] DEPT Provider Note   CSN: [id number] Arrival date & time: 03/02/16  1139     History   Chief Complaint Chief Complaint  Patient presents with  . Transient Ischemic Attack    HPI [name] is a 82 y.o. female.  The history is provided by the patient. No language interpreter was used.  Cerebrovascular Accident  This is a new problem. The current episode started less than 1 hour ago. The problem occurs constantly. The problem has been gradually worsening. Nothing aggravates the symptoms. Nothing relieves the symptoms. She has tried nothing for the symptoms. The treatment provided no relief.  Pt reports she had trouble seeing and speaking.  Pt reports symptoms lasted for 30 minutes.  All symptoms have completely resolved now.  Pt reports she has had similar in the past. Pt was told by neurologist that she had a TIA.  Pt reports symptoms resolved.   Pt is being evaluated by cardiology for possible afib.  Pt refused to have loop recorder placed.   Past Medical History:  Diagnosis Date  . Arthritis   . Bursitis    hips bilat   . CAD (coronary artery disease), autologous vein bypass graft March 2008   Follow-up cath: December 2015 Occluded SVG-D1 and occluded SVG-RI.  [name] CAD in native artery   . Dyslipidemia    Statin intolerant  . Glaucoma   . Hypertension   . Non-STEMI (non-ST elevated myocardial infarction) [facility]) October 2005   EF by 35-40%, echo 40-50%. Angiography: 99% mid LAD involving D1 followed by 70% mid LAD; 80% RI. --> CABG  . PONV (postoperative nausea and vomiting)   . S/P CABG x 01 February 2004   LIMA-LAD, SVG-D1, SVG-RI.  [name] Statin intolerance     Patient Active Problem List   Diagnosis Date Noted  . Cerebellar infarct ([place]) 02/16/2016  . Stroke (cerebrum) ([place]) 01/21/2016  . Neurological deficit present 01/21/2016  . Greater trochanteric bursitis of right hip 06/15/2015  . Preoperative cardiovascular examination 05/13/2015  . Chest pain of  uncertain etiology   . Chest pain 04/26/2014  . Hyperlipidemia LDL goal <70 05/14/2013  . Statin intolerance   . Essential hypertension   . CAD (coronary artery disease), autologous vein bypass graft 06/30/2006  . S/P CABG x 3 01/30/2004  . Non-STEMI (non-ST elevated myocardial infarction) ([place]) 01/30/2004    Past Surgical History:  Procedure Laterality Date  . BUNIONECTOMY     2002  . CARDIOVASCULAR STRESS TEST  05/23/2006   Mild lateral/inferolateral ischemia, likely due to occluded SVGs with exhisting disease.  [name] CAROTID DOPPLER  07/15/2009   Bilat ICAs 0-49% diameter reduction. Normal patency of Bilat subclavian arteries.  . CORONARY ARTERY BYPASS GRAFT  02/22/2004   x3. LIMA to distal LAD, SVG to first diag, SVG to ramus. SVG harvest from rt thigh.  [name] EXCISION/RELEASE BURSA HIP Right 06/15/2015   Procedure: RIGHT HIP BURSECTOMY WITH GLUTEAL TENDON REPAIR;  Surgeon: Gaynelle Arabian, MD;  Location: [facility];  Service: Orthopedics;  Laterality: Right;  . EYE SURGERY     cataract surgery bilat   . LEFT HEART CATHETERIZATION WITH CORONARY ANGIOGRAM  02/19/2004   Significant 2 vessel CAD - LAD, D1 and RI  . LEFT HEART CATHETERIZATION WITH CORONARY/GRAFT ANGIOGRAM N/A 04/28/2014   Procedure: LEFT HEART CATHETERIZATION WITH Beatrix Fetters;  Surgeon: Sinclair Grooms, MD;  Location: [facility] CATH LAB: CTO of SVG-Diag & SVG-RI, patent LIMA-LAD. Patent native circumflex, LAD and RCA. 70% stenosis in  a branch of RI. --> Does not explain "high risk perfusion study "  . LEFT HEART CATHETERIZATION WITH CORONARY/GRAFT ANGIOGRAM   07/25/2006   Totally occluded SVG to diag and ramus. Patent LIMA-LAD. Ramus 70-80% proximal stenosis and occluded vein graft.  [name] left total knee replacement      2001  . NM MYOVIEW LTD  January 2008; December 2015   a. Referred for For mild inferolateral and anterolateral/apical lateral defect with mild reversibility.;; b. Large defect in the anterior and inferior wall.  Suggestive of potential infarct plus ischemia. HIGH RISK.  . right total knee replacement      2003  . TRANSTHORACIC ECHOCARDIOGRAM  02/19/2004; December 2015   a. EF 45-50%, Normal LV function, moderate hypokinesis of anterior wall.;; b. EF 50-55%. No RWMA, GR 1 DD. Normal valves     OB History    No data available       Home Medications    Prior to Admission medications   Medication Sig Start Date End Date Taking? Authorizing Provider  acetaminophen (TYLENOL) 500 MG tablet Take 500 mg by mouth every 4 (four) hours as needed for moderate pain.    Historical Provider, MD  aspirin EC 81 MG tablet Take 81 mg by mouth at bedtime.     Historical Provider, MD  atenolol (TENORMIN) 25 MG tablet Take 25 mg by mouth at bedtime.    Historical Provider, MD  bimatoprost (LUMIGAN) 0.03 % ophthalmic solution Place 1 drop into both eyes at bedtime.    Historical Provider, MD  brimonidine (ALPHAGAN) 0.2 % ophthalmic solution Place 1 drop into both eyes 2 (two) times daily.    Historical Provider, MD  Cholecalciferol (VITAMIN D3) 2000 UNITS capsule Take 2,000 Units by mouth at bedtime.     Historical Provider, MD  clopidogrel (PLAVIX) 75 MG tablet TAKE 1 TABLET BY MOUTH EVERY DAY 06/09/15   Leonie Man, MD  lisinopril (PRINIVIL,ZESTRIL) 20 MG tablet Take 20 mg by mouth daily.    Historical Provider, MD  metroNIDAZOLE (METROCREAM) 0.75 % cream Apply 1 application topically 2 (two) times daily. 04/17/13   Historical Provider, MD  triamcinolone cream (KENALOG) 0.1 % Apply 1 application topically daily as needed (rash.).  03/31/13   Historical Provider, MD    Family History Family History  Problem Relation Age of Onset  . Alzheimer's disease Mother   . Alzheimer's disease Sister   . Hyperlipidemia Sister   . Hypertension Sister   . Diabetes Sister   . Hyperlipidemia Sister   . Hypertension Sister   . Pancreatitis Child     Social History Social History  Substance Use Topics  . Smoking  status: Never Smoker  . Smokeless tobacco: Never Used  . Alcohol use 0.5 oz/week    1 Standard drinks or equivalent per week     Comment: 1 glass of liquor weekly      Allergies   Nsaids; Statins; Tricor [fenofibrate]; and Zetia [ezetimibe]   Review of Systems Review of Systems  All other systems reviewed and are negative.    Physical Exam Updated Vital Signs There were no vitals taken for this visit.  Physical Exam  Constitutional: She appears well-developed and well-nourished. No distress.  HENT:  Head: Normocephalic and atraumatic.  Eyes: Conjunctivae are normal.  Neck: Neck supple.  Cardiovascular: Normal rate and regular rhythm.   No murmur heard. Pulmonary/Chest: Effort normal and breath sounds normal. No respiratory distress.  Abdominal: Soft. There is no tenderness.  Musculoskeletal: She  exhibits no edema.  Neurological: She is alert. She has normal reflexes. She displays normal reflexes. No cranial nerve deficit. She exhibits normal muscle tone. Coordination normal.  Skin: Skin is warm and dry.  Psychiatric: She has a normal mood and affect.  Nursing note and vitals reviewed.    ED Treatments / Results  Labs (all labs ordered are listed, but only abnormal results are displayed) Labs Reviewed - No data to display  EKG  EKG Interpretation None       Radiology Ct Head Wo Contrast  Result Date: 03/02/2016 CLINICAL DATA:  TA 4 weeks ago, trouble getting words out this morning and loss of vision, symptoms resolved following presentation to the ED, history hypertension, coronary artery disease post nonSTEMI and CABG, dyslipidemia EXAM: CT HEAD WITHOUT CONTRAST TECHNIQUE: Contiguous axial images were obtained from the base of the skull through the vertex without intravenous contrast. COMPARISON:  01/21/2016 FINDINGS: Brain: Mild atrophy. Normal ventricular morphology. No midline shift or mass effect. Small vessel chronic ischemic changes of deep cerebral white  matter. Small old BILATERAL cerebellar infarcts again identified. No intracranial hemorrhage, mass lesion, evidence acute infarction, or extra-axial fluid collection. Vascular: Atherosclerotic calcifications at the carotid siphons and minimally in RIGHT vertebral artery Skull: Intact Sinuses/Orbits: Clear Other: N/A IMPRESSION: Atrophy with small vessel chronic ischemic changes of deep cerebral white matter. Small BILATERAL cerebellar infarcts. No acute intracranial abnormalities. Electronically Signed   By: Lavonia Dana M.D.   On: 03/02/2016 12:28    Procedures Procedures (including critical care time)  Medications Ordered in ED Medications - No data to display   Initial Impression / Assessment and Plan / ED Course  I have reviewed the triage vital signs and the nursing notes.  Pertinent labs & imaging results that were available during my care of the patient were reviewed by me and considered in my medical decision making (see chart for details).  Clinical Course    EKg shows afib.this seems to be new although cardiology seems to have suspected that pt had afib.  Final Clinical Impressions(s) / ED Diagnoses   Final diagnoses:  Other specified transient cerebral ischemias    New Prescriptions New Prescriptions   No medications on file  I spoke to Dr. Rayann Heman who will see pt here for possible Aflutter.  Dr. Rayann Heman advised they will place recorder.    Fransico Meadow, PA-C 03/02/16 Lamar Yao, MD 03/03/16 [phone]

## 2016-03-02 NOTE — Telephone Encounter (Signed)
[name],patient was seen in the ED with TIA again, can you call and get her a follow up appointment in the next week or two with me please? We can look at the schedule together thanks

## 2016-03-02 NOTE — Progress Notes (Signed)
Discharge instruction given to pt and family. Pt and family able to verbalize understanding.  Pt denies any discomfort at this time. Pt to car via wheelchair

## 2016-03-02 NOTE — ED Notes (Signed)
Patient transported to MRI 

## 2016-03-02 NOTE — ED Notes (Signed)
Cardiology at bedside.

## 2016-03-02 NOTE — Progress Notes (Signed)
Ambulated to BR. Getting self dressed. Neighbor is here with patient. Patient's grandaughter is here to take patient home(grandaughter lives with patient).

## 2016-03-02 NOTE — Interval H&P Note (Signed)
History and Physical Interval Note:  03/02/2016 5:18 PM  [name]  has presented today for surgery, with the diagnosis of stroke  The various methods of treatment have been discussed with the patient and family. After consideration of risks, benefits and other options for treatment, the patient has consented to  Procedure(s): Loop Recorder Insertion (N/A) as a surgical intervention .  The patient's history has been reviewed, patient examined, no change in status, stable for surgery.  I have reviewed the patient's chart and labs.  Questions were answered to the patient's satisfaction.     Thompson Grayer

## 2016-03-03 ENCOUNTER — Encounter (HOSPITAL_COMMUNITY): Payer: Self-pay | Admitting: Internal Medicine

## 2016-03-03 NOTE — Telephone Encounter (Signed)
Called and spoke to pt. Scheduled f/u for 11/15 at 4:00pm ,check in 3:45pm. She verbalized understanding.

## 2016-03-15 ENCOUNTER — Ambulatory Visit (INDEPENDENT_AMBULATORY_CARE_PROVIDER_SITE_OTHER): Payer: Medicare Other | Admitting: Neurology

## 2016-03-15 ENCOUNTER — Encounter: Payer: Self-pay | Admitting: Podiatry

## 2016-03-15 ENCOUNTER — Encounter: Payer: Self-pay | Admitting: Neurology

## 2016-03-15 ENCOUNTER — Ambulatory Visit (INDEPENDENT_AMBULATORY_CARE_PROVIDER_SITE_OTHER): Payer: Medicare Other | Admitting: Podiatry

## 2016-03-15 VITALS — BP 195/75 | HR 77 | Ht 66.0 in | Wt 135.0 lb

## 2016-03-15 VITALS — Ht 66.0 in | Wt 133.0 lb

## 2016-03-15 DIAGNOSIS — G458 Other transient cerebral ischemic attacks and related syndromes: Secondary | ICD-10-CM

## 2016-03-15 DIAGNOSIS — Z9181 History of falling: Secondary | ICD-10-CM | POA: Diagnosis not present

## 2016-03-15 DIAGNOSIS — I639 Cerebral infarction, unspecified: Secondary | ICD-10-CM | POA: Diagnosis not present

## 2016-03-15 DIAGNOSIS — I10 Essential (primary) hypertension: Secondary | ICD-10-CM | POA: Diagnosis not present

## 2016-03-15 DIAGNOSIS — I671 Cerebral aneurysm, nonruptured: Secondary | ICD-10-CM | POA: Diagnosis not present

## 2016-03-15 DIAGNOSIS — I63443 Cerebral infarction due to embolism of bilateral cerebellar arteries: Secondary | ICD-10-CM

## 2016-03-15 DIAGNOSIS — F0280 Dementia in other diseases classified elsewhere without behavioral disturbance: Secondary | ICD-10-CM | POA: Diagnosis not present

## 2016-03-15 DIAGNOSIS — G3183 Dementia with Lewy bodies: Secondary | ICD-10-CM | POA: Diagnosis not present

## 2016-03-15 DIAGNOSIS — Q828 Other specified congenital malformations of skin: Secondary | ICD-10-CM

## 2016-03-15 DIAGNOSIS — R4189 Other symptoms and signs involving cognitive functions and awareness: Secondary | ICD-10-CM

## 2016-03-15 DIAGNOSIS — L84 Corns and callosities: Secondary | ICD-10-CM

## 2016-03-15 NOTE — Progress Notes (Signed)
[facility]    Provider:  [name] Referring Provider: Carol Ada, MD Primary Care Physician:  [name], MD  CC:  TIA  Interval history: Her blood pressure is extremely elevated, she has an appt this month, not symptomatic. She had an episode of confusion, couldn;t dial anybody, couldn;t remember anyone's names, She was in the house and no one else was there and she felt like she was having another episode. She remembers the whole event, she called 911 and the ambulance came. Repeat MRi did not show any new strokes. She has not had any episodes since then. She was able to talk to EMS. She has been dizzy after taking propranolol, advised to discuss with primary including elevated blood prussure.  Will order CTA of the head to further examine aneurysms, warned she needs to manage her BP due to risk of rupture, follow up with pcp int he next few weeks. Friend with her, they both acknowledge understanding.  HPI:  [name] is a 82 y.o. female here as a referral from Dr. Tamala Julian for TIA.  She has a past medical history of vitamin D deficiency, high cholesterol, hypertension, atherosclerotic heart disease, osteoarthritis, glaucoma, history of coronary artery bypass, TIA. Patient was out shopping and she suddenly couldn't talk, words wouldn;t come. No inciting event or previous illnesses or head trauma. MRi of the brain was negative for acute event. Friend is here and provides information. Friend says patient was confused for a short period of time afterwards, Patient couldn't find her keys, she was confused, had a difficult time finding money right in her purse, she couldn;t make complete sentences. Patient says she remembers it all, no LOC or altered awareness. She was thinking but she couldn't make words come out. Symptoms resolved completely. However she is also having hallucination recently, she is also seeing people, it just started a day and a half ago, she is  seeing people one had a cat with them and she saw someone in the yard. They were in her bedroom all night. Just started since yesterday, in the past week patient is more confused, she was seen Monday with Dr. Tamala Julian and they say a work up for metabolic/infectios causes ruled out however they do not think urine was taken. Patient is more confused in the last few weeks. She is having difficulty saying things, not completing sentences. More confusion. Also progressive memory changes the last year. Sister and mother with Alzheimer's dz  Cholesterol LDL 151. CMP 08/16/2015 with BUN 24 and creatinine 0.91.hgba1c 5.4.  Reviewed notes, labs and imaging from outside physicians, which showed:  Personally reviewed MRI images of the brain and garee with the following: IMPRESSION: 1. No acute intracranial abnormality. 2. Moderate chronic small vessel ischemic disease. 3. Chronic cerebellar infarcts. 4. No major intracranial branch vessel occlusion or significant proximal stenosis. 5. 5 mm right ICA paraophthalmic aneurysm. 6. Possible 3 mm left ICA aneurysm near the petrous-cavernous Junction.  EEG normal. ECHO did not show any clots in the heart.   Review of Systems: Patient complains of symptoms per HPI as well as the following symptoms:weight loss, fatigue, blurred vision, easy bruising him a hearing loss, runny nose, memory loss, confusion, headache, numbness, weakness, dizziness, tremor, decreased energy, change in appetite, disinterest in activities ertinent negatives per HPI. All others negative.   Social History   Social History  . Marital status: Widowed    Spouse name: N/A  . Number of children: 2  . Years of education:  16+   Occupational History  . retired    Social History Main Topics  . Smoking status: Never Smoker  . Smokeless tobacco: Never Used  . Alcohol use 0.5 oz/week    1 Standard drinks or equivalent per week     Comment: 1 glass of liquor weekly   . Drug use: No  .  Sexual activity: Not on file   Other Topics Concern  . Not on file   Social History Narrative   She is a widowed, mother of 2 grandmother of 3.    Lives with granddaughter.   Drinks coffee every morning. 1 cup.   She used to do exercising through the [employer] program as well as the [employer]. Unfortunately, this finding of the classes for seniors changed, and she was unable to make the new classes. She did not like exercising with non-seniors. Besides that she does existing disease, walks up and down the stairs, walk her dog. She is not as active as he had been before.   She never smoked, never drank alcohol.          Family History  Problem Relation Age of Onset  . Alzheimer's disease Mother   . Alzheimer's disease Sister   . Hyperlipidemia Sister   . Hypertension Sister   . Diabetes Sister   . Hyperlipidemia Sister   . Hypertension Sister   . Pancreatitis Child     Past Medical History:  Diagnosis Date  . Arthritis   . Bursitis    hips bilat   . CAD (coronary artery disease), autologous vein bypass graft March 2008   Follow-up cath: December 2015 Occluded SVG-D1 and occluded SVG-RI.  [name] CAD in native artery   . Dyslipidemia    Statin intolerant  . Glaucoma   . Hypertension   . Non-STEMI (non-ST elevated myocardial infarction) [facility]) October 2005   EF by 35-40%, echo 40-50%. Angiography: 99% mid LAD involving D1 followed by 70% mid LAD; 80% RI. --> CABG  . PONV (postoperative nausea and vomiting)   . S/P CABG x 01 February 2004   LIMA-LAD, SVG-D1, SVG-RI.  [name] Statin intolerance     Past Surgical History:  Procedure Laterality Date  . BUNIONECTOMY     2002  . CARDIOVASCULAR STRESS TEST  05/23/2006   Mild lateral/inferolateral ischemia, likely due to occluded SVGs with exhisting disease.  [name] CAROTID DOPPLER  07/15/2009   Bilat ICAs 0-49% diameter reduction. Normal patency of Bilat subclavian arteries.  . CORONARY ARTERY BYPASS GRAFT  02/22/2004   x3. LIMA to  distal LAD, SVG to first diag, SVG to ramus. SVG harvest from rt thigh.  . EP IMPLANTABLE DEVICE N/A 03/02/2016   Procedure: Loop Recorder Insertion;  Surgeon: Thompson Grayer, MD;  Location: [facility];  Service: Cardiovascular;  Laterality: N/A;  . EXCISION/RELEASE BURSA HIP Right 06/15/2015   Procedure: RIGHT HIP BURSECTOMY WITH GLUTEAL TENDON REPAIR;  Surgeon: Gaynelle Arabian, MD;  Location: [facility];  Service: Orthopedics;  Laterality: Right;  . EYE SURGERY     cataract surgery bilat   . LEFT HEART CATHETERIZATION WITH CORONARY ANGIOGRAM  02/19/2004   Significant 2 vessel CAD - LAD, D1 and RI  . LEFT HEART CATHETERIZATION WITH CORONARY/GRAFT ANGIOGRAM N/A 04/28/2014   Procedure: LEFT HEART CATHETERIZATION WITH Beatrix Fetters;  Surgeon: Sinclair Grooms, MD;  Location: [facility] CATH LAB: CTO of SVG-Diag & SVG-RI, patent LIMA-LAD. Patent native circumflex, LAD and RCA. 70% stenosis in a branch of RI. -->  Does not explain "high risk perfusion study "  . LEFT HEART CATHETERIZATION WITH CORONARY/GRAFT ANGIOGRAM   07/25/2006   Totally occluded SVG to diag and ramus. Patent LIMA-LAD. Ramus 70-80% proximal stenosis and occluded vein graft.  [name] left total knee replacement      2001  . NM MYOVIEW LTD  January 2008; December 2015   a. Referred for For mild inferolateral and anterolateral/apical lateral defect with mild reversibility.;; b. Large defect in the anterior and inferior wall. Suggestive of potential infarct plus ischemia. HIGH RISK.  . right total knee replacement      2003  . TRANSTHORACIC ECHOCARDIOGRAM  02/19/2004; December 2015   a. EF 45-50%, Normal LV function, moderate hypokinesis of anterior wall.;; b. EF 50-55%. No RWMA, GR 1 DD. Normal valves     Current Outpatient Prescriptions  Medication Sig Dispense Refill  . acetaminophen (TYLENOL) 500 MG tablet Take 500 mg by mouth every 6 (six) hours as needed (for pain).     [name] aspirin EC 81 MG tablet Take 81 mg by mouth at bedtime.      [name] atenolol (TENORMIN) 25 MG tablet Take 25 mg by mouth at bedtime.    . bimatoprost (LUMIGAN) 0.03 % ophthalmic solution Place 1 drop into both eyes at bedtime.    . brimonidine (ALPHAGAN) 0.2 % ophthalmic solution Place 1 drop into both eyes 2 (two) times daily.    . Cholecalciferol (VITAMIN D3) 2000 UNITS capsule Take 2,000 Units by mouth at bedtime.     . clopidogrel (PLAVIX) 75 MG tablet TAKE 1 TABLET BY MOUTH EVERY DAY (Patient taking differently: Take 75 mg by mouth in the evening) 30 tablet 10  . lisinopril (PRINIVIL,ZESTRIL) 20 MG tablet Take 20 mg by mouth daily.    . metroNIDAZOLE (METROCREAM) 0.75 % cream Apply 1 application topically 2 (two) times daily.     No current facility-administered medications for this visit.     Allergies as of 03/15/2016 - Review Complete 03/15/2016  Allergen Reaction Noted  . Nsaids Other (See Comments) 05/08/2013  . Statins Other (See Comments) 05/08/2013  . Tricor [fenofibrate] Other (See Comments) 05/08/2013  . Zetia [ezetimibe] Other (See Comments) 05/08/2013    Vitals: BP (!) 194/83 (BP Location: Left Arm, Patient Position: Sitting, Cuff Size: Normal)   Pulse 79   Ht 5\' 6"  (1.676 m)   Wt 135 lb (61.2 kg)   BMI 21.79 kg/m  Last Weight:  Wt Readings from Last 1 Encounters:  03/15/16 135 lb (61.2 kg)   Last Height:   Ht Readings from Last 1 Encounters:  03/15/16 5\' 6"  (1.676 m)     Physical exam: Exam: Gen: NAD, conversant, well nourised, well groomed                     CV: RRR, no MRG. No Carotid Bruits. No peripheral edema, warm, nontender Eyes: Conjunctivae clear without exudates or hemorrhage  Neuro: Detailed Neurologic Exam  Speech:    Speech is normal; fluent and spontaneous with impaired comprehension when asked to perform complex tasks.  Cognition: .mmse MMSE - Mini Mental State Exam 01/27/2016  Orientation to time 4  Orientation to Place 4  Registration 3  Attention/ Calculation 2  Recall 2  Language-  name 2 objects 2  Language- repeat 1  Language- follow 3 step command 3  Language- read & follow direction 1  Write a sentence 1  Copy design 0  Total score 23  The patient is oriented to person, place, and time;     recent memory impaired and remote memory impaired ;     language fluent;     Impaired attention, concentration,    fund of knowledge impaired Cranial Nerves:    The pupils are equal, round, and reactive to light. Attempted fundoscopic exam could not visualiz. Visual fields are full to finger confrontation. Extraocular movements are intact. Trigeminal sensation is intact and the muscles of mastication are normal. The face is symmetric. The palate elevates in the midline. Hearing intact. Voice is normal. Shoulder shrug is normal. The tongue has normal motion without fasciculations.   Coordination:    Normal finger to nose and heel to shin.  Gait:    Antalgic due to right leg pain (says she had a surgery on the right hip tendon and since then difficulty walking)  Motor Observation: mild postural and head tremor. No resting tremor.  Tone: Mild cogwheeling right > left UE. [name]    Posture:    Posture is normal. normal erect    Strength: Mild prox leg weakness bilat otherwise strength is V/V in the upper and lower limbs.      Sensation: intact to LT     Reflex Exam:  DTR's:    Deep tendon reflexes in the upper and lower extremities are normal bilaterally.   Toes:    The toes are downgoing bilaterally.   Clonus:    Clonus is absent.      Assessment/Plan:  82 y.o. female here as a referral from Dr. Tamala Julian for TIA.  She has a past medical history of vitamin D deficiency, high cholesterol, hypertension, atherosclerotic heart disease, osteoarthritis, glaucoma,  coronary artery bypass. Here for evaluation of acute episode of confusion vs TIA. Also more confused in the last few weeks with hallucinations.   - Patient with multiple bilateral cerebellar infarcts  already on plavix and aspirin. Had loop recorder placed -23/30 on MMSE unclear if this is decreased due to recent confusion or if she has onset of dementia. Need to follow. - 44mm and 65mm aneurysms, discussed with patient and friend and risk of bleeding especially with her elevated BP. Order CTA and afterwards refer for cerebral angiography based on results - Discussed dementia, gave resources for [employer] and organization. Also possible is lewy body dementia given the hallucinations.  - EEG for recent confusion.Slowed. If she has another incident without cardiac correlate, needs a 3-day eeg. - CTA of the head to further evaluate the aneurysms seen on MRA. Advised need to watch blood pressure.  - Home heath care nurse for medication management, blood pressure monitoring (signifcantly elevated today with 2 unruptured aneurysms at risk with elevated BP) and a counselor to help her at home with how to use her safety device for falls and a home inspection for safety. Patient has mental confusion, multiple TIAs or possible seizureslikely dementia.   Cc: [name], MD  Sarina Ill, MD  [facility] [address] [place], [place] [address]  Phone [phone] Fax [phone]  A total of 40 minutes was spent face-to-face with this patient. Over half this time was spent on counseling patient on the TIA, aneurysms diagnosis and different diagnostic and therapeutic options available.

## 2016-03-15 NOTE — Progress Notes (Signed)
Subjective:     Patient ID: [name], female   DOB: July 09, 1931, 82 y.o.   MRN: [id number]  HPIThis patient presentsa to the office for painful calluses under the ball of both feet.  She says calluses are painful walking and wearing shoes,.She presents for preventive footcare services.   Review of Systems     Objective:   Physical Exam GENERAL APPEARANCE: Alert, conversant. Appropriately groomed. No acute distress.  VASCULAR: Pedal pulses palpable at  [facility] and PT bilateral.  Capillary refill time is immediate to all digits,  Normal temperature gradient.  Digital hair growth is present bilateral  NEUROLOGIC: sensation is normal to 5.07 monofilament at 5/5 sites bilateral.  Light touch is intact bilateral, Muscle strength normal.  MUSCULOSKELETAL: acceptable muscle strength, tone and stability bilateral.  Intrinsic muscluature intact bilateral.  HAV 1st MPJ B/L.  Hammer toes 2-5 B/L.  DERMATOLOGIC: skin color, texture, and turgor are within normal limits.  No preulcerative lesions or ulcers  are seen, no interdigital maceration noted.  No open lesions present.  Digital nails are asymptomatic. No drainage noted. Porokeratosis noted sub 1 B/L.      Assessment:     Porokeratosis B/L     Plan:     Debride porokeratosis RTC 3 months   Gardiner Barefoot DPM

## 2016-03-15 NOTE — Patient Instructions (Signed)
Remember to drink plenty of fluid, eat healthy meals and do not skip any meals. Try to eat protein with a every meal and eat a healthy snack such as fruit or nuts in between meals. Try to keep a regular sleep-wake schedule and try to exercise daily, particularly in the form of walking, 20-30 minutes a day, if you can.   As far as your medications are concerned, I would like to suggest: continue current medication  As far as diagnostic testing: CTA of the head, home nursing  I would like to see you back in 3 months, sooner if we need to. Please call us with any interim questions, concerns, problems, updates or refill requests.   Our phone number is [phone]. We also have an after hours call service for urgent matters and there is a physician on-call for urgent questions. For any emergencies you know to call 911 or go to the nearest emergency room

## 2016-03-20 ENCOUNTER — Ambulatory Visit (INDEPENDENT_AMBULATORY_CARE_PROVIDER_SITE_OTHER): Payer: Medicare Other | Admitting: *Deleted

## 2016-03-20 DIAGNOSIS — I639 Cerebral infarction, unspecified: Secondary | ICD-10-CM

## 2016-03-20 NOTE — Progress Notes (Signed)
Wound  Loop check in clinic . Steri-strips removed. Wound without redness or edema. Incision edges approximated, wound well healed.. Battery status: good. R-waves 1.09 mV. 0 symptom episodes, 0 tachy episodes, Pause and brady episodes off at implant. 0 AF episodes. Monthly summary reports and ROV with JA PRN. Pt educated about wound care and home monitoring.

## 2016-03-22 ENCOUNTER — Telehealth: Payer: Self-pay | Admitting: Neurology

## 2016-03-22 DIAGNOSIS — I671 Cerebral aneurysm, nonruptured: Secondary | ICD-10-CM | POA: Diagnosis not present

## 2016-03-22 DIAGNOSIS — I252 Old myocardial infarction: Secondary | ICD-10-CM | POA: Diagnosis not present

## 2016-03-22 DIAGNOSIS — G3183 Dementia with Lewy bodies: Secondary | ICD-10-CM | POA: Diagnosis not present

## 2016-03-22 DIAGNOSIS — Z951 Presence of aortocoronary bypass graft: Secondary | ICD-10-CM | POA: Diagnosis not present

## 2016-03-22 DIAGNOSIS — E785 Hyperlipidemia, unspecified: Secondary | ICD-10-CM | POA: Diagnosis not present

## 2016-03-22 DIAGNOSIS — I2510 Atherosclerotic heart disease of native coronary artery without angina pectoris: Secondary | ICD-10-CM | POA: Diagnosis not present

## 2016-03-22 DIAGNOSIS — R2689 Other abnormalities of gait and mobility: Secondary | ICD-10-CM | POA: Diagnosis not present

## 2016-03-22 DIAGNOSIS — Z8673 Personal history of transient ischemic attack (TIA), and cerebral infarction without residual deficits: Secondary | ICD-10-CM | POA: Diagnosis not present

## 2016-03-22 DIAGNOSIS — H409 Unspecified glaucoma: Secondary | ICD-10-CM | POA: Diagnosis not present

## 2016-03-22 DIAGNOSIS — I6782 Cerebral ischemia: Secondary | ICD-10-CM | POA: Diagnosis not present

## 2016-03-22 DIAGNOSIS — F0280 Dementia in other diseases classified elsewhere without behavioral disturbance: Secondary | ICD-10-CM | POA: Diagnosis not present

## 2016-03-22 DIAGNOSIS — M1990 Unspecified osteoarthritis, unspecified site: Secondary | ICD-10-CM | POA: Diagnosis not present

## 2016-03-22 DIAGNOSIS — M7061 Trochanteric bursitis, right hip: Secondary | ICD-10-CM | POA: Diagnosis not present

## 2016-03-22 DIAGNOSIS — Z7982 Long term (current) use of aspirin: Secondary | ICD-10-CM | POA: Diagnosis not present

## 2016-03-22 DIAGNOSIS — I10 Essential (primary) hypertension: Secondary | ICD-10-CM | POA: Diagnosis not present

## 2016-03-22 NOTE — Telephone Encounter (Signed)
Tammy/[facility] [phone] request VO for start of care eval of TIA  2 x 1 x 3 then 1 x 1.

## 2016-03-22 NOTE — Telephone Encounter (Signed)
Rn call Tammy at [facility] about starting nursing care. Rn stated verbal order per Dr. Jaynee Eagles notes. Tammy verbalized understanding.

## 2016-03-24 DIAGNOSIS — G3183 Dementia with Lewy bodies: Secondary | ICD-10-CM | POA: Diagnosis not present

## 2016-03-24 DIAGNOSIS — I671 Cerebral aneurysm, nonruptured: Secondary | ICD-10-CM | POA: Diagnosis not present

## 2016-03-24 DIAGNOSIS — I6782 Cerebral ischemia: Secondary | ICD-10-CM | POA: Diagnosis not present

## 2016-03-24 DIAGNOSIS — I10 Essential (primary) hypertension: Secondary | ICD-10-CM | POA: Diagnosis not present

## 2016-03-24 DIAGNOSIS — F0280 Dementia in other diseases classified elsewhere without behavioral disturbance: Secondary | ICD-10-CM | POA: Diagnosis not present

## 2016-03-24 DIAGNOSIS — M7061 Trochanteric bursitis, right hip: Secondary | ICD-10-CM | POA: Diagnosis not present

## 2016-03-28 NOTE — Telephone Encounter (Signed)
Liji/[facility] [phone] called request VO for homehealth PT 1x1, 2x2, 1x1 to work on balance, gait and transfer and home exercise program. She also wants to request speech eval.

## 2016-03-28 NOTE — Telephone Encounter (Signed)
Called Liji back from [facility] and gave VO for PT 1x1, 2x2, and 1x1 as well as speech therapy. Ok per AA,MD. She verbalized understanding.  Patient completed PT evaluation last week.

## 2016-03-29 ENCOUNTER — Ambulatory Visit
Admission: RE | Admit: 2016-03-29 | Discharge: 2016-03-29 | Disposition: A | Discharge status: Home / Self Care | Payer: Medicare Other | Source: Ambulatory Visit | Attending: Neurology | Admitting: Neurology

## 2016-03-29 DIAGNOSIS — I672 Cerebral atherosclerosis: Secondary | ICD-10-CM | POA: Diagnosis not present

## 2016-03-29 DIAGNOSIS — I10 Essential (primary) hypertension: Secondary | ICD-10-CM | POA: Diagnosis not present

## 2016-03-29 DIAGNOSIS — I671 Cerebral aneurysm, nonruptured: Secondary | ICD-10-CM

## 2016-03-29 DIAGNOSIS — M7061 Trochanteric bursitis, right hip: Secondary | ICD-10-CM | POA: Diagnosis not present

## 2016-03-29 DIAGNOSIS — G3183 Dementia with Lewy bodies: Secondary | ICD-10-CM | POA: Diagnosis not present

## 2016-03-29 DIAGNOSIS — I6782 Cerebral ischemia: Secondary | ICD-10-CM | POA: Diagnosis not present

## 2016-03-29 DIAGNOSIS — F0280 Dementia in other diseases classified elsewhere without behavioral disturbance: Secondary | ICD-10-CM | POA: Diagnosis not present

## 2016-03-29 MED ORDER — IOPAMIDOL (ISOVUE-370) INJECTION 76%
80.0000 mL | Freq: Once | INTRAVENOUS | Status: AC | PRN
  Administered 2016-03-29: 80 mL via INTRAVENOUS

## 2016-03-29 NOTE — Telephone Encounter (Signed)
Faxed signed order by AA,MD back to [facility] health for nursing care 2W3, [phone]. Fax: [phone]. Received confirmation.

## 2016-03-30 DIAGNOSIS — I10 Essential (primary) hypertension: Secondary | ICD-10-CM | POA: Diagnosis not present

## 2016-03-30 DIAGNOSIS — G3183 Dementia with Lewy bodies: Secondary | ICD-10-CM | POA: Diagnosis not present

## 2016-03-30 DIAGNOSIS — I671 Cerebral aneurysm, nonruptured: Secondary | ICD-10-CM | POA: Diagnosis not present

## 2016-03-30 DIAGNOSIS — M7061 Trochanteric bursitis, right hip: Secondary | ICD-10-CM | POA: Diagnosis not present

## 2016-03-30 DIAGNOSIS — I6782 Cerebral ischemia: Secondary | ICD-10-CM | POA: Diagnosis not present

## 2016-03-30 DIAGNOSIS — F0280 Dementia in other diseases classified elsewhere without behavioral disturbance: Secondary | ICD-10-CM | POA: Diagnosis not present

## 2016-03-30 NOTE — Telephone Encounter (Signed)
Sonal Mather/[facility] [phone] called to advise she will not be able to see the pt until 12/4 due to the patient having CT, PT and a luncheon today.  FYI

## 2016-03-30 NOTE — Telephone Encounter (Signed)
Dr Ahern- FYI 

## 2016-03-31 DIAGNOSIS — I6782 Cerebral ischemia: Secondary | ICD-10-CM | POA: Diagnosis not present

## 2016-03-31 DIAGNOSIS — I10 Essential (primary) hypertension: Secondary | ICD-10-CM | POA: Diagnosis not present

## 2016-03-31 DIAGNOSIS — F0280 Dementia in other diseases classified elsewhere without behavioral disturbance: Secondary | ICD-10-CM | POA: Diagnosis not present

## 2016-03-31 DIAGNOSIS — M7061 Trochanteric bursitis, right hip: Secondary | ICD-10-CM | POA: Diagnosis not present

## 2016-03-31 DIAGNOSIS — I671 Cerebral aneurysm, nonruptured: Secondary | ICD-10-CM | POA: Diagnosis not present

## 2016-03-31 DIAGNOSIS — G3183 Dementia with Lewy bodies: Secondary | ICD-10-CM | POA: Diagnosis not present

## 2016-04-03 ENCOUNTER — Telehealth: Payer: Self-pay | Admitting: *Deleted

## 2016-04-03 ENCOUNTER — Ambulatory Visit (INDEPENDENT_AMBULATORY_CARE_PROVIDER_SITE_OTHER): Payer: Medicare Other | Admitting: *Deleted

## 2016-04-03 DIAGNOSIS — F0280 Dementia in other diseases classified elsewhere without behavioral disturbance: Secondary | ICD-10-CM | POA: Diagnosis not present

## 2016-04-03 DIAGNOSIS — I671 Cerebral aneurysm, nonruptured: Secondary | ICD-10-CM | POA: Diagnosis not present

## 2016-04-03 DIAGNOSIS — I10 Essential (primary) hypertension: Secondary | ICD-10-CM | POA: Diagnosis not present

## 2016-04-03 DIAGNOSIS — I639 Cerebral infarction, unspecified: Secondary | ICD-10-CM | POA: Diagnosis not present

## 2016-04-03 DIAGNOSIS — G3183 Dementia with Lewy bodies: Secondary | ICD-10-CM | POA: Diagnosis not present

## 2016-04-03 DIAGNOSIS — M7061 Trochanteric bursitis, right hip: Secondary | ICD-10-CM | POA: Diagnosis not present

## 2016-04-03 DIAGNOSIS — I6782 Cerebral ischemia: Secondary | ICD-10-CM | POA: Diagnosis not present

## 2016-04-03 NOTE — Telephone Encounter (Signed)
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 This not very large but there is still a small risk of rupture. However there is also risk with procedures to fix this. We have a follow up appt next month I suggest we discuss at that time, thanks

## 2016-04-03 NOTE — Telephone Encounter (Signed)
Called and spoke to pt about results per AA,MD note. Pt verbalized understanding.

## 2016-04-03 NOTE — Progress Notes (Signed)
[facility] Summary Report / Loop Recorder 

## 2016-04-03 NOTE — Telephone Encounter (Signed)
Sonal Mather with [facility] is calling to advise the patient was seen today and would like a verbal order for speech therapy twice a week for 2 weeks for the patient.

## 2016-04-03 NOTE — Telephone Encounter (Signed)
Called and LVM for [name]. Gave VO for ST for 2x/week for 2 weeks. Ok per AA,MD.   Also advised I faxed signed order for POC and SN to teach disease management. Gave [facility] phone number if she has further questions/concerns.

## 2016-04-04 ENCOUNTER — Encounter: Payer: Self-pay | Admitting: *Deleted

## 2016-04-04 NOTE — Progress Notes (Signed)
Faxed signed F2F encounter form back to [facility] for encounter date 03/15/16. Fax: [phone]. Received confirmation.

## 2016-04-05 ENCOUNTER — Encounter (HOSPITAL_COMMUNITY): Payer: Self-pay | Admitting: *Deleted

## 2016-04-05 ENCOUNTER — Telehealth: Payer: Self-pay | Admitting: Neurology

## 2016-04-05 ENCOUNTER — Observation Stay (HOSPITAL_COMMUNITY): Payer: Medicare Other

## 2016-04-05 ENCOUNTER — Emergency Department (HOSPITAL_COMMUNITY): Payer: Medicare Other

## 2016-04-05 ENCOUNTER — Observation Stay (HOSPITAL_COMMUNITY)
Admission: EM | Admit: 2016-04-05 | Discharge: 2016-04-06 | Disposition: A | Discharge status: Home / Self Care | Payer: Medicare Other | Attending: Internal Medicine | Admitting: Internal Medicine

## 2016-04-05 DIAGNOSIS — R531 Weakness: Secondary | ICD-10-CM

## 2016-04-05 DIAGNOSIS — Z7982 Long term (current) use of aspirin: Secondary | ICD-10-CM | POA: Insufficient documentation

## 2016-04-05 DIAGNOSIS — Z8673 Personal history of transient ischemic attack (TIA), and cerebral infarction without residual deficits: Secondary | ICD-10-CM | POA: Insufficient documentation

## 2016-04-05 DIAGNOSIS — R410 Disorientation, unspecified: Secondary | ICD-10-CM

## 2016-04-05 DIAGNOSIS — M1990 Unspecified osteoarthritis, unspecified site: Secondary | ICD-10-CM | POA: Insufficient documentation

## 2016-04-05 DIAGNOSIS — Z888 Allergy status to other drugs, medicaments and biological substances status: Secondary | ICD-10-CM | POA: Diagnosis not present

## 2016-04-05 DIAGNOSIS — I639 Cerebral infarction, unspecified: Secondary | ICD-10-CM | POA: Diagnosis not present

## 2016-04-05 DIAGNOSIS — I2510 Atherosclerotic heart disease of native coronary artery without angina pectoris: Secondary | ICD-10-CM | POA: Insufficient documentation

## 2016-04-05 DIAGNOSIS — E785 Hyperlipidemia, unspecified: Secondary | ICD-10-CM | POA: Insufficient documentation

## 2016-04-05 DIAGNOSIS — H409 Unspecified glaucoma: Secondary | ICD-10-CM | POA: Insufficient documentation

## 2016-04-05 DIAGNOSIS — Z66 Do not resuscitate: Secondary | ICD-10-CM | POA: Diagnosis not present

## 2016-04-05 DIAGNOSIS — Z96651 Presence of right artificial knee joint: Secondary | ICD-10-CM | POA: Diagnosis not present

## 2016-04-05 DIAGNOSIS — G459 Transient cerebral ischemic attack, unspecified: Secondary | ICD-10-CM | POA: Diagnosis present

## 2016-04-05 DIAGNOSIS — G458 Other transient cerebral ischemic attacks and related syndromes: Secondary | ICD-10-CM | POA: Diagnosis not present

## 2016-04-05 DIAGNOSIS — E042 Nontoxic multinodular goiter: Secondary | ICD-10-CM | POA: Diagnosis not present

## 2016-04-05 DIAGNOSIS — R2990 Unspecified symptoms and signs involving the nervous system: Secondary | ICD-10-CM | POA: Diagnosis not present

## 2016-04-05 DIAGNOSIS — I10 Essential (primary) hypertension: Secondary | ICD-10-CM | POA: Diagnosis not present

## 2016-04-05 DIAGNOSIS — Z951 Presence of aortocoronary bypass graft: Secondary | ICD-10-CM | POA: Diagnosis not present

## 2016-04-05 DIAGNOSIS — I728 Aneurysm of other specified arteries: Secondary | ICD-10-CM | POA: Insufficient documentation

## 2016-04-05 DIAGNOSIS — Z7902 Long term (current) use of antithrombotics/antiplatelets: Secondary | ICD-10-CM | POA: Insufficient documentation

## 2016-04-05 DIAGNOSIS — Z9889 Other specified postprocedural states: Secondary | ICD-10-CM | POA: Insufficient documentation

## 2016-04-05 DIAGNOSIS — I671 Cerebral aneurysm, nonruptured: Secondary | ICD-10-CM

## 2016-04-05 DIAGNOSIS — G3189 Other specified degenerative diseases of nervous system: Secondary | ICD-10-CM | POA: Diagnosis not present

## 2016-04-05 DIAGNOSIS — R200 Anesthesia of skin: Principal | ICD-10-CM | POA: Insufficient documentation

## 2016-04-05 DIAGNOSIS — I672 Cerebral atherosclerosis: Secondary | ICD-10-CM | POA: Insufficient documentation

## 2016-04-05 DIAGNOSIS — M40202 Unspecified kyphosis, cervical region: Secondary | ICD-10-CM | POA: Insufficient documentation

## 2016-04-05 DIAGNOSIS — G43109 Migraine with aura, not intractable, without status migrainosus: Secondary | ICD-10-CM

## 2016-04-05 DIAGNOSIS — I252 Old myocardial infarction: Secondary | ICD-10-CM | POA: Insufficient documentation

## 2016-04-05 HISTORY — DX: Cerebral infarction, unspecified: I63.9

## 2016-04-05 LAB — COMPREHENSIVE METABOLIC PANEL
ALBUMIN: 4.2 g/dL (ref 3.5–5.0)
ALK PHOS: 68 U/L (ref 38–126)
ALT: 13 U/L — ABNORMAL LOW (ref 14–54)
ANION GAP: 10 (ref 5–15)
AST: 19 U/L (ref 15–41)
BUN: 11 mg/dL (ref 6–20)
CHLORIDE: 107 mmol/L (ref 101–111)
CO2: 25 mmol/L (ref 22–32)
Calcium: 9.9 mg/dL (ref 8.9–10.3)
Creatinine, Ser: 0.84 mg/dL (ref 0.44–1.00)
GFR calc non Af Amer: >60 mL/min (ref >60)
GLUCOSE: 109 mg/dL — AB (ref 65–99)
POTASSIUM: 3.5 mmol/L (ref 3.5–5.1)
SODIUM: 142 mmol/L (ref 135–145)
Total Bilirubin: 0.6 mg/dL (ref 0.3–1.2)
Total Protein: 6.5 g/dL (ref 6.5–8.1)

## 2016-04-05 LAB — URINALYSIS, ROUTINE W REFLEX MICROSCOPIC
BILIRUBIN URINE: NEGATIVE
GLUCOSE, UA: NEGATIVE mg/dL
HGB URINE DIPSTICK: NEGATIVE
KETONES UR: NEGATIVE mg/dL
Leukocytes, UA: NEGATIVE
NITRITE: NEGATIVE
PH: 6 (ref 5.0–8.0)
Protein, ur: NEGATIVE mg/dL
SPECIFIC GRAVITY, URINE: 1.006 (ref 1.005–1.030)

## 2016-04-05 LAB — DIFFERENTIAL
BASOS PCT: 0 %
Basophils Absolute: 0 10*3/uL (ref 0.0–0.1)
EOS PCT: 1 %
Eosinophils Absolute: 0 10*3/uL (ref 0.0–0.7)
LYMPHS PCT: 34 %
Lymphs Abs: 1.8 10*3/uL (ref 0.7–4.0)
MONO ABS: 0.3 10*3/uL (ref 0.1–1.0)
Monocytes Relative: 6 %
NEUTROS ABS: 3.1 10*3/uL (ref 1.7–7.7)
NEUTROS PCT: 59 %

## 2016-04-05 LAB — I-STAT CHEM 8, ED
BUN: 14 mg/dL (ref 6–20)
CHLORIDE: 106 mmol/L (ref 101–111)
Calcium, Ion: 1.19 mmol/L (ref 1.15–1.40)
Creatinine, Ser: 0.8 mg/dL (ref 0.44–1.00)
Glucose, Bld: 103 mg/dL — ABNORMAL HIGH (ref 65–99)
HEMATOCRIT: 41 % (ref 36.0–46.0)
Hemoglobin: 13.9 g/dL (ref 12.0–15.0)
Potassium: 3.4 mmol/L — ABNORMAL LOW (ref 3.5–5.1)
SODIUM: 144 mmol/L (ref 135–145)
TCO2: 25 mmol/L (ref 0–100)

## 2016-04-05 LAB — CBC
HCT: 41 % (ref 36.0–46.0)
Hemoglobin: 14 g/dL (ref 12.0–15.0)
MCH: 31.4 pg (ref 26.0–34.0)
MCHC: 34.1 g/dL (ref 30.0–36.0)
MCV: 91.9 fL (ref 78.0–100.0)
PLATELETS: 264 10*3/uL (ref 150–400)
RBC: 4.46 MIL/uL (ref 3.87–5.11)
RDW: 13.3 % (ref 11.5–15.5)
WBC: 5.4 10*3/uL (ref 4.0–10.5)

## 2016-04-05 LAB — PROTIME-INR
INR: 0.95
PROTHROMBIN TIME: 12.7 s (ref 11.4–15.2)

## 2016-04-05 LAB — RAPID URINE DRUG SCREEN, HOSP PERFORMED
AMPHETAMINES: NOT DETECTED
BENZODIAZEPINES: NOT DETECTED
Barbiturates: NOT DETECTED
COCAINE: NOT DETECTED
OPIATES: NOT DETECTED
TETRAHYDROCANNABINOL: NOT DETECTED

## 2016-04-05 LAB — I-STAT TROPONIN, ED: Troponin i, poc: 0.01 ng/mL (ref 0.00–0.08)

## 2016-04-05 LAB — ETHANOL

## 2016-04-05 LAB — APTT: aPTT: 27 seconds (ref 24–36)

## 2016-04-05 LAB — CBG MONITORING, ED: GLUCOSE-CAPILLARY: 95 mg/dL (ref 65–99)

## 2016-04-05 MED ORDER — ENOXAPARIN SODIUM 40 MG/0.4ML ~~LOC~~ SOLN
40.0000 mg | SUBCUTANEOUS | Status: DC
  Administered 2016-04-05: 40 mg via SUBCUTANEOUS
  Filled 2016-04-05: qty 0.4

## 2016-04-05 MED ORDER — OXYCODONE HCL 5 MG PO TABS: 5.0000 mg | ORAL_TABLET | ORAL | Status: DC | PRN

## 2016-04-05 MED ORDER — SODIUM CHLORIDE 0.9% FLUSH: 3.0000 mL | Freq: Two times a day (BID) | INTRAVENOUS | Status: DC

## 2016-04-05 MED ORDER — ACETAMINOPHEN 325 MG PO TABS: 650.0000 mg | ORAL_TABLET | Freq: Four times a day (QID) | ORAL | Status: DC | PRN

## 2016-04-05 MED ORDER — STROKE: EARLY STAGES OF RECOVERY BOOK
Freq: Once | Status: AC
  Administered 2016-04-05: 1
  Filled 2016-04-05: qty 1

## 2016-04-05 MED ORDER — BRIMONIDINE TARTRATE 0.2 % OP SOLN
1.0000 [drp] | Freq: Two times a day (BID) | OPHTHALMIC | Status: DC
  Administered 2016-04-05 – 2016-04-06 (×2): 1 [drp] via OPHTHALMIC
  Filled 2016-04-05: qty 5

## 2016-04-05 MED ORDER — SODIUM CHLORIDE 0.45 % IV SOLN
INTRAVENOUS | Status: DC
  Administered 2016-04-05: 14:00:00 via INTRAVENOUS

## 2016-04-05 MED ORDER — ASPIRIN 300 MG RE SUPP: 300.0000 mg | Freq: Every day | RECTAL | Status: DC

## 2016-04-05 MED ORDER — ONDANSETRON HCL 4 MG PO TABS: 4.0000 mg | ORAL_TABLET | Freq: Four times a day (QID) | ORAL | Status: DC | PRN

## 2016-04-05 MED ORDER — POTASSIUM CHLORIDE CRYS ER 20 MEQ PO TBCR
40.0000 meq | EXTENDED_RELEASE_TABLET | Freq: Once | ORAL | Status: AC
  Administered 2016-04-05: 40 meq via ORAL
  Filled 2016-04-05: qty 2

## 2016-04-05 MED ORDER — METRONIDAZOLE 0.75 % EX CREA
1.0000 "application " | TOPICAL_CREAM | Freq: Two times a day (BID) | CUTANEOUS | Status: DC | PRN
  Filled 2016-04-05: qty 45

## 2016-04-05 MED ORDER — CLOPIDOGREL BISULFATE 75 MG PO TABS
75.0000 mg | ORAL_TABLET | Freq: Every day | ORAL | Status: DC
  Administered 2016-04-06: 75 mg via ORAL
  Filled 2016-04-05: qty 1

## 2016-04-05 MED ORDER — ONDANSETRON HCL 4 MG/2ML IJ SOLN: 4.0000 mg | Freq: Four times a day (QID) | INTRAMUSCULAR | Status: DC | PRN

## 2016-04-05 MED ORDER — ASPIRIN 325 MG PO TABS
325.0000 mg | ORAL_TABLET | Freq: Every day | ORAL | Status: DC
  Administered 2016-04-06: 325 mg via ORAL
  Filled 2016-04-05: qty 1

## 2016-04-05 MED ORDER — ACETAMINOPHEN 650 MG RE SUPP: 650.0000 mg | Freq: Four times a day (QID) | RECTAL | Status: DC | PRN

## 2016-04-05 MED ORDER — HYDRALAZINE HCL 20 MG/ML IJ SOLN: 5.0000 mg | INTRAMUSCULAR | Status: DC | PRN

## 2016-04-05 NOTE — Telephone Encounter (Signed)
[name]- would you like to do anything different? Pt going to ED.

## 2016-04-05 NOTE — Code Documentation (Signed)
82 y.o. female arrived to [facility] via private vehicle. Pt presented with c/o left sided numbness and blurred vision that acutely started at 0945 this morning. Pt has a hx of TIA's. Pt taken to CT. No LVO present. tPA not given d/t symptoms mild/improving. See EMR for NIHSS and code stroke times. Upon assessment, pt seems to be neuro intact, however some word finding/hesitation was noted in her speech. Pt taken to room in ED. Pt is TIA Alert and will remain in the tPA window until 1415. Bedside handoff with ED RN Raquel Sarna.

## 2016-04-05 NOTE — Telephone Encounter (Signed)
error 

## 2016-04-05 NOTE — ED Notes (Signed)
[facility] contacted to activate Code Stroke 

## 2016-04-05 NOTE — Telephone Encounter (Signed)
[facility] [phone] called to advise pt called her said she was having lt sided facial weakness, tongue and hand numbness which last a few minutes and has resolved now. She wanted to know what Dr A suggested. Operator advised her pt should go to ED as clinic does not triage. She agreed, said the pt was reluctant to go but she was going to send her Rn to her home and call pt's daughter.

## 2016-04-05 NOTE — ED Provider Notes (Signed)
[facility] DEPT Provider Note   CSN: [id number] Arrival date & time: 04/05/16  1106     History   Chief Complaint Chief Complaint  Patient presents with  . Code Stroke    HPI [name] is a 82 y.o. female who presents emergency with chief complaint of sudden onset left sided numbness and weakness. At 9:45 AM this morning. Patient had sudden onset numbness and weakness with a little bit of left eye blurring. She came into the emergency department, and the code stroke was called. She is a past history of previous TIA with the workup and the upper quarter placement about 2 months ago. At the time of her previous admission on 03/02/2016. The parent. Patient had sudden onset difficulty with speech. The patient is on daily Plavix. She denies any numbness in her symptoms have resolved predominantly at this time, however, she continues to have some left leg weakness  HPI  Past Medical History:  Diagnosis Date  . Arthritis   . Bursitis    hips bilat   . CAD (coronary artery disease), autologous vein bypass graft March 2008   Follow-up cath: December 2015 Occluded SVG-D1 and occluded SVG-RI.  [name] CAD in native artery   . Dyslipidemia    Statin intolerant  . Glaucoma   . Hypertension   . Non-STEMI (non-ST elevated myocardial infarction) [facility]) October 2005   EF by 35-40%, echo 40-50%. Angiography: 99% mid LAD involving D1 followed by 70% mid LAD; 80% RI. --> CABG  . PONV (postoperative nausea and vomiting)   . S/P CABG x 01 February 2004   LIMA-LAD, SVG-D1, SVG-RI.  [name] Statin intolerance   . Stroke [facility])     Patient Active Problem List   Diagnosis Date Noted  . Cerebellar infarct ([place]) 02/16/2016  . Stroke (cerebrum) ([place]) 01/21/2016  . Neurological deficit present 01/21/2016  . Greater trochanteric bursitis of right hip 06/15/2015  . Preoperative cardiovascular examination 05/13/2015  . Chest pain of uncertain etiology   . Chest pain 04/26/2014  . Hyperlipidemia LDL goal  <70 05/14/2013  . Statin intolerance   . Essential hypertension   . CAD (coronary artery disease), autologous vein bypass graft 06/30/2006  . S/P CABG x 3 01/30/2004  . Non-STEMI (non-ST elevated myocardial infarction) ([place]) 01/30/2004    Past Surgical History:  Procedure Laterality Date  . BUNIONECTOMY     2002  . CARDIOVASCULAR STRESS TEST  05/23/2006   Mild lateral/inferolateral ischemia, likely due to occluded SVGs with exhisting disease.  [name] CAROTID DOPPLER  07/15/2009   Bilat ICAs 0-49% diameter reduction. Normal patency of Bilat subclavian arteries.  . CORONARY ARTERY BYPASS GRAFT  02/22/2004   x3. LIMA to distal LAD, SVG to first diag, SVG to ramus. SVG harvest from rt thigh.  . EP IMPLANTABLE DEVICE N/A 03/02/2016   Procedure: Loop Recorder Insertion;  Surgeon: Thompson Grayer, MD;  Location: [facility] CV LAB;  Service: Cardiovascular;  Laterality: N/A;  . EXCISION/RELEASE BURSA HIP Right 06/15/2015   Procedure: RIGHT HIP BURSECTOMY WITH GLUTEAL TENDON REPAIR;  Surgeon: Gaynelle Arabian, MD;  Location: [facility] ORS;  Service: Orthopedics;  Laterality: Right;  . EYE SURGERY     cataract surgery bilat   . LEFT HEART CATHETERIZATION WITH CORONARY ANGIOGRAM  02/19/2004   Significant 2 vessel CAD - LAD, D1 and RI  . LEFT HEART CATHETERIZATION WITH CORONARY/GRAFT ANGIOGRAM N/A 04/28/2014   Procedure: LEFT HEART CATHETERIZATION WITH Beatrix Fetters;  Surgeon: Sinclair Grooms, MD;  Location: [facility]  CATH LAB: CTO of SVG-Diag & SVG-RI, patent LIMA-LAD. Patent native circumflex, LAD and RCA. 70% stenosis in a branch of RI. --> Does not explain "high risk perfusion study "  . LEFT HEART CATHETERIZATION WITH CORONARY/GRAFT ANGIOGRAM   07/25/2006   Totally occluded SVG to diag and ramus. Patent LIMA-LAD. Ramus 70-80% proximal stenosis and occluded vein graft.  [name] left total knee replacement      2001  . NM MYOVIEW LTD  January 2008; December 2015   a. Referred for For mild inferolateral and  anterolateral/apical lateral defect with mild reversibility.;; b. Large defect in the anterior and inferior wall. Suggestive of potential infarct plus ischemia. HIGH RISK.  . right total knee replacement      2003  . TRANSTHORACIC ECHOCARDIOGRAM  02/19/2004; December 2015   a. EF 45-50%, Normal LV function, moderate hypokinesis of anterior wall.;; b. EF 50-55%. No RWMA, GR 1 DD. Normal valves     OB History    No data available       Home Medications    Prior to Admission medications   Medication Sig Start Date End Date Taking? Authorizing Provider  acetaminophen (TYLENOL) 500 MG tablet Take 500 mg by mouth every 6 (six) hours as needed (for pain).     Historical Provider, MD  aspirin EC 81 MG tablet Take 81 mg by mouth at bedtime.     Historical Provider, MD  atenolol (TENORMIN) 25 MG tablet Take 25 mg by mouth at bedtime.    Historical Provider, MD  bimatoprost (LUMIGAN) 0.03 % ophthalmic solution Place 1 drop into both eyes at bedtime.    Historical Provider, MD  brimonidine (ALPHAGAN) 0.2 % ophthalmic solution Place 1 drop into both eyes 2 (two) times daily.    Historical Provider, MD  Cholecalciferol (VITAMIN D3) 2000 UNITS capsule Take 2,000 Units by mouth at bedtime.     Historical Provider, MD  clopidogrel (PLAVIX) 75 MG tablet TAKE 1 TABLET BY MOUTH EVERY DAY Patient taking differently: Take 75 mg by mouth in the evening 06/09/15   Leonie Man, MD  lisinopril (PRINIVIL,ZESTRIL) 20 MG tablet Take 20 mg by mouth daily.    Historical Provider, MD  metroNIDAZOLE (METROCREAM) 0.75 % cream Apply 1 application topically 2 (two) times daily. 04/17/13   Historical Provider, MD    Family History Family History  Problem Relation Age of Onset  . Alzheimer's disease Mother   . Alzheimer's disease Sister   . Hyperlipidemia Sister   . Hypertension Sister   . Diabetes Sister   . Hyperlipidemia Sister   . Hypertension Sister   . Pancreatitis Child     Social History Social  History  Substance Use Topics  . Smoking status: Never Smoker  . Smokeless tobacco: Never Used  . Alcohol use 0.5 oz/week    1 Standard drinks or equivalent per week     Comment: 1 glass of liquor weekly      Allergies   Nsaids; Statins; Tricor [fenofibrate]; and Zetia [ezetimibe]   Review of Systems Review of Systems Ten systems reviewed and are negative for acute change, except as noted in the HPI.    Physical Exam Updated Vital Signs BP (!) 202/80 (BP Location: Right Arm)   Pulse 80   Temp 98.6 F (37 C) (Oral)   Resp 16   Wt 60.7 kg   SpO2 99%   BMI 21.60 kg/m   Physical Exam  Constitutional: She is oriented to person, place, and time. She appears  well-developed and well-nourished. No distress.  HENT:  Head: Normocephalic and atraumatic.  Eyes: Conjunctivae are normal. No scleral icterus.  Neck: Normal range of motion.  Cardiovascular: Normal rate, regular rhythm and normal heart sounds.  Exam reveals no gallop and no friction rub.   No murmur heard. Pulmonary/Chest: Effort normal and breath sounds normal. No respiratory distress.  Abdominal: Soft. Bowel sounds are normal. She exhibits no distension and no mass. There is no tenderness. There is no guarding.  Neurological: She is alert and oriented to person, place, and time.  See notes by Dr. Leonel Ramsay. No drift  Left leg weakness noted CN II-XII intact Normal F-N   Skin: Skin is warm and dry. She is not diaphoretic.     ED Treatments / Results  Labs (all labs ordered are listed, but only abnormal results are displayed) Labs Reviewed  I-STAT CHEM 8, ED - Abnormal; Notable for the following:       Result Value   Potassium 3.4 (*)    Glucose, Bld 103 (*)    All other components within normal limits  ETHANOL  PROTIME-INR  APTT  CBC  DIFFERENTIAL  COMPREHENSIVE METABOLIC PANEL  RAPID URINE DRUG SCREEN, HOSP PERFORMED  URINALYSIS, ROUTINE W REFLEX MICROSCOPIC  I-STAT TROPOININ, ED    EKG  EKG  Interpretation None       Radiology No results found.  Procedures Procedures (including critical care time)  Medications Ordered in ED Medications - No data to display   Initial Impression / Assessment and Plan / ED Course  I have reviewed the triage vital signs and the nursing notes.  Pertinent labs & imaging results that were available during my care of the patient were reviewed by me and considered in my medical decision making (see chart for details).  Clinical Course as of Apr 05 1814  Wed Apr 05, 2016  1200 Mild hypokalemia, will give PO 40 Meq if she passes the swallow screen Potassium: (!) 3.4 [AH]  1201 NO Afib or other dysrhythmia ED EKG [AH]    Clinical Course User Index [AH] Margarita Mail, PA-C   Patient with new TIA vs. Stroke symptoms. She has marked hypertension here in the emergency department. She has been seen by Dr. Leonel Ramsay for a code stroke. Initial CT appears negative. However, she has some residual left leg weakness without drift and we have concern for possibility of a stroke. Also will need another full workup. Given that her symptoms were present a different region of the brain. Concern for potential cardiac etiology given the different vessel territories.   Final Clinical Impressions(s) / ED Diagnoses   Final diagnoses:  Stroke [facility])  Stroke [facility])    New Prescriptions New Prescriptions   No medications on file     Margarita Mail, PA-C 04/05/16 [place], MD 04/05/16 [id number]

## 2016-04-05 NOTE — H&P (Signed)
History and Physical    TASHVI FRANCIES [id number] DOB: [birth year] DOA: 04/05/2016  PCP: [name], MD Patient coming from: home  Chief Complaint: L sided numbness and weakness.   HPI: ASUZENA VOLCY is a 82 y.o. female with medical history significant of CVA, CAD/NSTEMI s/p CABG, HTN, HLD *(statin intolerance), presenting w/ a foggy headed feeling, confusion, and left-sided numbness and weakness. Patient reports awaking at 6 AM in her normal state of health. Patient's daughter noted that patient was acting somewhat confused during breakfast. Patient states she Really the same thing over and over the newspaper without any understanding as to what was being read. Approximately 09:00 patient developed left upper extremity and left lower extremity numbness and weakness. This persisted for approximately 10 minutes before beginning to resolve. EMS was called to evaluate patient who brought patient to the emergency room. Patient states that her left upper extremity left lower extremity numbness weakness has completely resolved but maintains some element of feeling "foggy headed."  Denies any recent chest pain, palpitations, shortness of breath, nausea, vomiting, headache, neck stiffness, fevers, diarrhea, back pain, rash.  ED Course: Objective data outlined below. Evaluated by neurology.  Review of Systems: As per HPI otherwise 10 point review of systems negative.   Ambulatory Status: Slow but purposeful movements. No restrictions.  Past Medical History:  Diagnosis Date  . Arthritis   . Bursitis    hips bilat   . CAD (coronary artery disease), autologous vein bypass graft March 2008   Follow-up cath: December 2015 Occluded SVG-D1 and occluded SVG-RI.  [name] CAD in native artery   . Dyslipidemia    Statin intolerant  . Glaucoma   . Hypertension   . Non-STEMI (non-ST elevated myocardial infarction) [facility]) October 2005   EF by 35-40%, echo 40-50%. Angiography: 99% mid LAD involving  D1 followed by 70% mid LAD; 80% RI. --> CABG  . PONV (postoperative nausea and vomiting)   . S/P CABG x 01 February 2004   LIMA-LAD, SVG-D1, SVG-RI.  [name] Statin intolerance   . Stroke [facility])     Past Surgical History:  Procedure Laterality Date  . BUNIONECTOMY     2002  . CARDIOVASCULAR STRESS TEST  05/23/2006   Mild lateral/inferolateral ischemia, likely due to occluded SVGs with exhisting disease.  [name] CAROTID DOPPLER  07/15/2009   Bilat ICAs 0-49% diameter reduction. Normal patency of Bilat subclavian arteries.  . CORONARY ARTERY BYPASS GRAFT  02/22/2004   x3. LIMA to distal LAD, SVG to first diag, SVG to ramus. SVG harvest from rt thigh.  . EP IMPLANTABLE DEVICE N/A 03/02/2016   Procedure: Loop Recorder Insertion;  Surgeon: Thompson Grayer, MD;  Location: [place] CV [facility];  Service: Cardiovascular;  Laterality: N/A;  . EXCISION/RELEASE BURSA HIP Right 06/15/2015   Procedure: RIGHT HIP BURSECTOMY WITH GLUTEAL TENDON REPAIR;  Surgeon: Gaynelle Arabian, MD;  Location: [facility];  Service: Orthopedics;  Laterality: Right;  . EYE SURGERY     cataract surgery bilat   . LEFT HEART CATHETERIZATION WITH CORONARY ANGIOGRAM  02/19/2004   Significant 2 vessel CAD - LAD, D1 and RI  . LEFT HEART CATHETERIZATION WITH CORONARY/GRAFT ANGIOGRAM N/A 04/28/2014   Procedure: LEFT HEART CATHETERIZATION WITH Beatrix Fetters;  Surgeon: Sinclair Grooms, MD;  Location: [facility] CATH LAB: CTO of SVG-Diag & SVG-RI, patent LIMA-LAD. Patent native circumflex, LAD and RCA. 70% stenosis in a branch of RI. --> Does not explain "high risk perfusion study "  . LEFT HEART CATHETERIZATION WITH  CORONARY/GRAFT ANGIOGRAM   07/25/2006   Totally occluded SVG to diag and ramus. Patent LIMA-LAD. Ramus 70-80% proximal stenosis and occluded vein graft.  [name] left total knee replacement      2001  . NM MYOVIEW LTD  January 2008; December 2015   a. Referred for For mild inferolateral and anterolateral/apical lateral defect with mild  reversibility.;; b. Large defect in the anterior and inferior wall. Suggestive of potential infarct plus ischemia. HIGH RISK.  . right total knee replacement      2003  . TRANSTHORACIC ECHOCARDIOGRAM  02/19/2004; December 2015   a. EF 45-50%, Normal LV function, moderate hypokinesis of anterior wall.;; b. EF 50-55%. No RWMA, GR 1 DD. Normal valves     Social History   Social History  . Marital status: Widowed    Spouse name: N/A  . Number of children: 2  . Years of education: 16+   Occupational History  . retired    Social History Main Topics  . Smoking status: Never Smoker  . Smokeless tobacco: Never Used  . Alcohol use 0.5 oz/week    1 Standard drinks or equivalent per week     Comment: 1 glass of liquor weekly   . Drug use: No  . Sexual activity: Not on file   Other Topics Concern  . Not on file   Social History Narrative   She is a widowed, mother of 2 grandmother of 3.    Lives with granddaughter.   Drinks coffee every morning. 1 cup.   She used to do exercising through the [employer] program as well as the [employer]. Unfortunately, this finding of the classes for seniors changed, and she was unable to make the new classes. She did not like exercising with non-seniors. Besides that she does existing disease, walks up and down the stairs, walk her dog. She is not as active as he had been before.   She never smoked, never drank alcohol.          Allergies  Allergen Reactions  . Nsaids Other (See Comments)    Make patient unable to urinate  . Statins Other (See Comments)    HURTS ALL OVER  . Tricor [Fenofibrate] Other (See Comments)    HURTS ALL OVER  . Zetia [Ezetimibe] Other (See Comments)    Hurt all over.     Family History  Problem Relation Age of Onset  . Alzheimer's disease Mother   . Alzheimer's disease Sister   . Hyperlipidemia Sister   . Hypertension Sister   . Diabetes Sister   . Hyperlipidemia Sister   . Hypertension Sister   .  Pancreatitis Child     Prior to Admission medications   Medication Sig Start Date End Date Taking? Authorizing Provider  acetaminophen (TYLENOL) 500 MG tablet Take 500 mg by mouth every 6 (six) hours as needed (for pain).     Historical Provider, MD  aspirin EC 81 MG tablet Take 81 mg by mouth at bedtime.     Historical Provider, MD  atenolol (TENORMIN) 25 MG tablet Take 25 mg by mouth at bedtime.    Historical Provider, MD  bimatoprost (LUMIGAN) 0.03 % ophthalmic solution Place 1 drop into both eyes at bedtime.    Historical Provider, MD  brimonidine (ALPHAGAN) 0.2 % ophthalmic solution Place 1 drop into both eyes 2 (two) times daily.    Historical Provider, MD  Cholecalciferol (VITAMIN D3) 2000 UNITS capsule Take 2,000 Units by mouth at bedtime.  Historical Provider, MD  clopidogrel (PLAVIX) 75 MG tablet TAKE 1 TABLET BY MOUTH EVERY DAY Patient taking differently: Take 75 mg by mouth in the evening 06/09/15   Leonie Man, MD  lisinopril (PRINIVIL,ZESTRIL) 20 MG tablet Take 20 mg by mouth daily.    Historical Provider, MD  metroNIDAZOLE (METROCREAM) 0.75 % cream Apply 1 application topically 2 (two) times daily. 04/17/13   Historical Provider, MD    Physical Exam: Vitals:   04/05/16 1215 04/05/16 1221 04/05/16 1245 04/05/16 1300  BP: 166/73  189/72 170/64  Pulse: 76  80 76  Resp: 14  18 14   Temp:  98.7 F (37.1 C)    TempSrc:      SpO2: 98%  99% 98%  Weight:         General:  Appears calm and comfortable Eyes:  PERRL, EOMI, normal lids, iris ENT:  grossly normal hearing, lips & tongue, mmm Neck:  no LAD, masses or thyromegaly Cardiovascular:  RRR, no m/r/g. No LE edema.  Respiratory:  CTA bilaterally, no w/r/r. Normal respiratory effort. Abdomen:  soft, ntnd, NABS Skin:  no rash or induration seen on limited exam Musculoskeletal:  grossly normal tone BUE/BLE, good ROM, no bony abnormality Psychiatric:  grossly normal mood and affect, speech fluent and appropriate,  AOx3 Neurologic: Cranial nerves II through XII grossly intact. Intermittent mild confusion with basic questions, 4 out of 5 bilateral grip strength, 4 out of 5 bilateral lower extremity strength with flexion.  Labs on Admission: I have personally reviewed following labs and imaging studies  CBC:  Recent Labs Lab 04/05/16 1129 04/05/16 1135  WBC 5.4  --   NEUTROABS 3.1  --   HGB 14.0 13.9  HCT 41.0 41.0  MCV 91.9  --   PLT 264  --    Basic Metabolic Panel:  Recent Labs Lab 04/05/16 1129 04/05/16 1135  NA 142 144  K 3.5 3.4*  CL 107 106  CO2 25  --   GLUCOSE 109* 103*  BUN 11 14  CREATININE 0.84 0.80  CALCIUM 9.9  --    GFR: Estimated Creatinine Clearance: 50.8 mL/min (by C-G formula based on SCr of 0.8 mg/dL). Liver Function Tests:  Recent Labs Lab 04/05/16 1129  AST 19  ALT 13*  ALKPHOS 68  BILITOT 0.6  PROT 6.5  ALBUMIN 4.2   No results for input(s): LIPASE, AMYLASE in the last 168 hours. No results for input(s): AMMONIA in the last 168 hours. Coagulation Profile:  Recent Labs Lab 04/05/16 1129  INR 0.95   Cardiac Enzymes: No results for input(s): CKTOTAL, CKMB, CKMBINDEX, TROPONINI in the last 168 hours. BNP (last 3 results) No results for input(s): PROBNP in the last 8760 hours. HbA1C: No results for input(s): HGBA1C in the last 72 hours. CBG:  Recent Labs Lab 04/05/16 1140  GLUCAP 95   Lipid Profile: No results for input(s): CHOL, HDL, LDLCALC, TRIG, CHOLHDL, LDLDIRECT in the last 72 hours. Thyroid Function Tests: No results for input(s): TSH, T4TOTAL, FREET4, T3FREE, THYROIDAB in the last 72 hours. Anemia Panel: No results for input(s): VITAMINB12, FOLATE, FERRITIN, TIBC, IRON, RETICCTPCT in the last 72 hours. Urine analysis:    Component Value Date/Time   COLORURINE STRAW (A) 04/05/2016 [place] 04/05/2016 1234   APPEARANCEUR Clear 01/27/2016 1115   LABSPEC 1.006 04/05/2016 1234   PHURINE 6.0 04/05/2016 1234    GLUCOSEU NEGATIVE 04/05/2016 1234   HGBUR NEGATIVE 04/05/2016 [place] 04/05/2016 1234  BILIRUBINUR Negative 01/27/2016 1115   KETONESUR NEGATIVE 04/05/2016 1234   PROTEINUR NEGATIVE 04/05/2016 1234   NITRITE NEGATIVE 04/05/2016 1234   LEUKOCYTESUR NEGATIVE 04/05/2016 1234   LEUKOCYTESUR Negative 01/27/2016 1115    Creatinine Clearance: Estimated Creatinine Clearance: 50.8 mL/min (by C-G formula based on SCr of 0.8 mg/dL).  Sepsis Labs: @LABRCNTIP (procalcitonin:4,lacticidven:4) )No results found for this or any previous visit (from the past 240 hour(s)).   Radiological Exams on Admission: Ct Head Code Stroke W/o Cm  Result Date: 04/05/2016 CLINICAL DATA:  Code stroke. LEFT-sided numbness and weakness which began earlier today. Symptoms have now begun to resolve. EXAM: CT HEAD WITHOUT CONTRAST TECHNIQUE: Contiguous axial images were obtained from the base of the skull through the vertex without intravenous contrast. COMPARISON:  03/29/2016 CTA study. MR brain 03/02/2016. Noncontrast CT head 03/02/2016. FINDINGS: Brain: No evidence for acute infarction, hemorrhage, mass lesion, hydrocephalus, or extra-axial fluid. Chronic BILATERAL cerebellar infarcts. Cerebral atrophy. Hypoattenuation of white matter representing small vessel disease. Vascular: No hyperdense vessel. Mural calcification of the cavernous carotid arteries and RIGHT greater than LEFT vertebral arteries is stable. Skull: Normal. Negative for fracture or focal lesion. Sinuses/Orbits: No acute finding. Other: None. ASPECTS [facility] Stroke Program Early CT Score) - Ganglionic level infarction (caudate, lentiform nuclei, internal capsule, insula, M1-M3 cortex): 7 - Supraganglionic infarction (M4-M6 cortex): 3 Total score (0-10 with 10 being normal): 10 Compared with priors, similar appearance. IMPRESSION: 1. Atrophy and small vessel disease. No acute intracranial findings. Stable appearance from prior imaging in  November. 2. ASPECTS is 10. These results were sent by secure [facility] text message at the time of interpretation on 04/05/2016 at 11:43 am to Dr. Roland Rack. Electronically Signed   By: Staci Righter M.D.   On: 04/05/2016 11:47    EKG: Independently reviewed. Sinus , PAC, no ACS  Assessment/Plan Active Problems:   S/P CABG x 3   TIA (transient ischemic attack)   Stroke-like symptoms   Left-sided weakness   Confusion   L sided numbness: Concern for stroke vs TIA. Symptoms improving. Per neurology evaluation this appears to be a different location from previous insult. Warrants full neurological workup. Carotid duplex from September 2017 showing 1-39% stenosis. No need to repeat carotid duplex. - Tele - MRI/MRA head - Echo as possible embolic source - stroke orderset. - Neuro recs -Neuro checks - Continue ASA/Plavix  HTN: permissive HTN - Hold atenolol, lisinopril - Hydralazine prn  CAD/MI: s/p CABG - continue ASA/Plavix     DVT prophylaxis: Lovenix.   Code Status: dnr - confirmed w/ pt. Present since previous admissions Family Communication: daughter  Disposition Plan: pending stroke workup and resolution of symptoms  Consults called: neuro  Admission status: observation    Maddux First J MD [facility]  If 7PM-7AM, please contact night-coverage [email or web address] Password [email or web address]  04/05/2016, 1:49 PM

## 2016-04-05 NOTE — ED Provider Notes (Signed)
Developed sudden onset numbness of tongue and a left hand 9:30 AM today symptoms have since resolved. She is presently asymptomatic. Code stroke called in the field. Patient alert Glasgow Coma Score 15. Moves all extremities speech clear. Nose 2 through 12 grossly intact. NIH stroke scale stroke scale calculated at [place], MD 04/05/16 1158

## 2016-04-05 NOTE — Progress Notes (Signed)
Pt received at 17:08pm from ED with no noted distress. Pt stable, neuro intact. Pt denies pain or discomfort during assessment. She states that her left sided numbness has resolved. Telemetry monitoring.  Pt oriented to room. Safety measures in place. Granddaughter at bedside. Call bell within reach. Will continue to monitor.

## 2016-04-05 NOTE — Consult Note (Signed)
Neurology Consultation Reason for Consult: Left-sided weakness and numbness Referring Physician: Lyn Hollingshead  CC: Left-sided weakness and numbness  History is obtained from: Patient  HPI: [name] is a 82 y.o. female with a history of several recent episodes concerning for TIA. With the first one, she had difficulty speaking for about 10-15 minutes. With the second one, she had difficulty dialing the phone, transient confusion. With third, today, she describes that she had sudden onset of numbness and mild weakness of her left side. By the time of arrival, this was rapidly improving and by the time of my exam she was back to baseline.   LKW: 9:45 am tpa given?: no, resolved symptoms    ROS: A 14 point ROS was performed and is negative except as noted in the HPI.  Past Medical History:  Diagnosis Date  . Arthritis   . Bursitis    hips bilat   . CAD (coronary artery disease), autologous vein bypass graft March 2008   Follow-up cath: December 2015 Occluded SVG-D1 and occluded SVG-RI.  [name] CAD in native artery   . Dyslipidemia    Statin intolerant  . Glaucoma   . Hypertension   . Non-STEMI (non-ST elevated myocardial infarction) [facility]) October 2005   EF by 35-40%, echo 40-50%. Angiography: 99% mid LAD involving D1 followed by 70% mid LAD; 80% RI. --> CABG  . PONV (postoperative nausea and vomiting)   . S/P CABG x 01 February 2004   LIMA-LAD, SVG-D1, SVG-RI.  [name] Statin intolerance   . Stroke [facility])      Family History  Problem Relation Age of Onset  . Alzheimer's disease Mother   . Alzheimer's disease Sister   . Hyperlipidemia Sister   . Hypertension Sister   . Diabetes Sister   . Hyperlipidemia Sister   . Hypertension Sister   . Pancreatitis Child      Social History:  reports that she has never smoked. She has never used smokeless tobacco. She reports that she drinks about 0.5 oz of alcohol per week . She reports that she does not use drugs.   Exam: Current  vital signs: BP 189/88   Pulse 79   Temp 98.7 F (37.1 C)   Resp 13   Wt 60.7 kg (133 lb 13.1 oz)   SpO2 97%   BMI 21.60 kg/m  Vital signs in last 24 hours: Temp:  [98.6 F (37 C)-98.7 F (37.1 C)] 98.7 F (37.1 C) (12/06 1221) Pulse Rate:  [72-83] 79 (12/06 1400) Resp:  [13-19] 13 (12/06 1400) BP: (160-202)/(56-88) 189/88 (12/06 1400) SpO2:  [97 %-100 %] 97 % (12/06 1400) Weight:  [60.7 kg (133 lb 13.1 oz)] 60.7 kg (133 lb 13.1 oz) (12/06 1124)   Physical Exam  Constitutional: Appears well-developed and well-nourished.  Psych: Affect appropriate to situation Eyes: No scleral injection HENT: No OP obstrucion Head: Normocephalic.  Cardiovascular: Normal rate and regular rhythm.  Respiratory: Effort normal and breath sounds normal to anterior ascultation GI: Soft.  No distension. There is no tenderness.  Skin: WDI  Neuro: Mental Status: Patient is awake, alert, oriented to person,  month, age Patient is able to give a clear and coherent history. No signs of aphasia or neglect Cranial Nerves: II: Visual Fields are full. Pupils are equal, round, and reactive to light.   III,IV, VI: EOMI without ptosis or diploplia.  V: Facial sensation is symmetric to temperature VII: Facial movement is symmetric.  VIII: hearing is intact to voice X: Uvula  elevates symmetrically XI: Shoulder shrug is symmetric. XII: tongue is midline without atrophy or fasciculations.  Motor: Tone is normal. Bulk is normal. 5/5 strength was present in all four extremities.  Sensory: Sensation is symmetric to light touch and temperature in the arms and legs. Deep Tendon Reflexes: 2+ and symmetric in the biceps and patellae.  Cerebellar: FNF and HKS are intact bilaterally    I have reviewed labs in epic and the results pertinent to this consultation are: Chem 8 unremarkable  I have reviewed the images obtained:CT head - no acute findings  Impression: 82 year old female with transient  left-sided numbness and weakness. Though her previous episodes or a little bit less clear, this one seems very much to be consistent with TIA. I would favor admission with repeat echocardiogram as given that speech delay as well as left-sided numbness have been symptoms and this would be consistent with an cardiac source.  Recommendations: 1. HgbA1c, fasting lipid panel 2. MRI, MRA  of the brain without contrast 3. Frequent neuro checks 4. Echocardiogram 5. Carotid dopplers 6. Prophylactic therapy-Antiplatelet med: Aspirin - dose 325mg  PO or 300mg  PR 7. Risk factor modification 8. Telemetry monitoring 9. PT consult, OT consult, Speech consult 10. please page stroke NP  Or  PA  Or MD  M-F from 8am -4 pm starting 12/7 as this patient will be followed by the stroke team at this point.   You can look them up on [email or web address]      Roland Rack, MD [facility] [phone]  If 7pm- 7am, please page neurology on call as listed in [place].

## 2016-04-05 NOTE — ED Triage Notes (Signed)
Pt reports onset 0945 of left side numbness. Hx of TIAs. No decicits noted at this time.

## 2016-04-06 ENCOUNTER — Observation Stay (HOSPITAL_BASED_OUTPATIENT_CLINIC_OR_DEPARTMENT_OTHER): Payer: Medicare Other

## 2016-04-06 ENCOUNTER — Observation Stay (HOSPITAL_COMMUNITY): Payer: Medicare Other

## 2016-04-06 DIAGNOSIS — G459 Transient cerebral ischemic attack, unspecified: Secondary | ICD-10-CM | POA: Diagnosis not present

## 2016-04-06 DIAGNOSIS — R410 Disorientation, unspecified: Secondary | ICD-10-CM | POA: Diagnosis not present

## 2016-04-06 DIAGNOSIS — G43109 Migraine with aura, not intractable, without status migrainosus: Secondary | ICD-10-CM

## 2016-04-06 DIAGNOSIS — R531 Weakness: Secondary | ICD-10-CM | POA: Diagnosis not present

## 2016-04-06 DIAGNOSIS — Z951 Presence of aortocoronary bypass graft: Secondary | ICD-10-CM | POA: Diagnosis not present

## 2016-04-06 DIAGNOSIS — I10 Essential (primary) hypertension: Secondary | ICD-10-CM | POA: Diagnosis not present

## 2016-04-06 DIAGNOSIS — I671 Cerebral aneurysm, nonruptured: Secondary | ICD-10-CM

## 2016-04-06 DIAGNOSIS — R2990 Unspecified symptoms and signs involving the nervous system: Secondary | ICD-10-CM | POA: Diagnosis not present

## 2016-04-06 LAB — HEMOGLOBIN A1C
Hgb A1c MFr Bld: 5.3 % (ref 4.8–5.6)
MEAN PLASMA GLUCOSE: 105 mg/dL

## 2016-04-06 LAB — CBC
HCT: 37.5 % (ref 36.0–46.0)
HEMOGLOBIN: 12.4 g/dL (ref 12.0–15.0)
MCH: 30.5 pg (ref 26.0–34.0)
MCHC: 33.1 g/dL (ref 30.0–36.0)
MCV: 92.4 fL (ref 78.0–100.0)
Platelets: 229 10*3/uL (ref 150–400)
RBC: 4.06 MIL/uL (ref 3.87–5.11)
RDW: 13.3 % (ref 11.5–15.5)
WBC: 4.8 10*3/uL (ref 4.0–10.5)

## 2016-04-06 LAB — LIPID PANEL
CHOLESTEROL: 229 mg/dL — AB (ref 0–200)
HDL: 58 mg/dL (ref >40)
LDL CALC: 150 mg/dL — AB (ref 0–99)
Total CHOL/HDL Ratio: 3.9 RATIO
Triglycerides: 103 mg/dL (ref <150)
VLDL: 21 mg/dL (ref 0–40)

## 2016-04-06 LAB — VITAMIN B12: Vitamin B-12: 180 pg/mL (ref 180–914)

## 2016-04-06 LAB — BASIC METABOLIC PANEL
Anion gap: 8 (ref 5–15)
BUN: 10 mg/dL (ref 6–20)
CALCIUM: 9.5 mg/dL (ref 8.9–10.3)
CHLORIDE: 108 mmol/L (ref 101–111)
CO2: 26 mmol/L (ref 22–32)
CREATININE: 0.75 mg/dL (ref 0.44–1.00)
GFR calc Af Amer: >60 mL/min (ref >60)
GFR calc non Af Amer: >60 mL/min (ref >60)
Glucose, Bld: 85 mg/dL (ref 65–99)
Potassium: 3.3 mmol/L — ABNORMAL LOW (ref 3.5–5.1)
SODIUM: 142 mmol/L (ref 135–145)

## 2016-04-06 LAB — TSH: TSH: 2.155 u[IU]/mL (ref 0.350–4.500)

## 2016-04-06 MED ORDER — POTASSIUM CHLORIDE CRYS ER 20 MEQ PO TBCR
40.0000 meq | EXTENDED_RELEASE_TABLET | Freq: Once | ORAL | Status: AC
  Administered 2016-04-06: 40 meq via ORAL
  Filled 2016-04-06: qty 2

## 2016-04-06 MED ORDER — IOPAMIDOL (ISOVUE-370) INJECTION 76%
INTRAVENOUS | Status: AC
  Administered 2016-04-06: 50 mL
  Filled 2016-04-06: qty 50

## 2016-04-06 MED ORDER — NIACIN 100 MG PO TABS: 100.0000 mg | ORAL_TABLET | Freq: Two times a day (BID) | ORAL | 0 refills | Status: DC

## 2016-04-06 NOTE — Progress Notes (Signed)
Nutrition Brief Note  Patient identified on the Malnutrition Screening Tool (MST) Report  Wt Readings from Last 15 Encounters:  04/05/16 133 lb 9.6 oz (60.6 kg)  03/15/16 135 lb (61.2 kg)  03/15/16 133 lb (60.3 kg)  02/21/16 133 lb 12.8 oz (60.7 kg)  02/16/16 134 lb (60.8 kg)  01/27/16 130 lb 3.2 oz (59.1 kg)  01/21/16 141 lb 15.6 oz (64.4 kg)  06/15/15 142 lb (64.4 kg)  06/08/15 142 lb (64.4 kg)  05/11/15 144 lb (65.3 kg)  05/04/14 142 lb 12.8 oz (64.8 kg)  04/29/14 138 lb 0.1 oz (62.6 kg)  05/16/13 144 lb (65.3 kg)  05/12/13 144 lb 3.2 oz (65.4 kg)   [name] is a 82 y.o. female with medical history significant of CVA, CAD/NSTEMI s/p CABG, HTN, HLD *(statin intolerance), presenting w/ a foggy headed feeling, confusion, and left-sided numbness and weakness.   Pt admitted with lt sided numbness and probable TIA.   Spoke with pt at bedside, who reports fair appetite at baseline. SHe estimates she consumed about 75% of her breakfast meal this morning. She denies any changes in her food intake or diet quality. SHe consumes 3 meals per day (Breakfast: muffin and yogurt, lunch: peanut butter crackers, dinner: soup and salad). Pt shares that she has lost the taste for meat, but often substitutes protein with other meat sources.   Pt shares that she has lost 45# over the past several years, however, wt hx does not confirm this statement. UBW is around 135-140#. Pt reports that she has not had any recently weight loss, but has lost some muscle tone related to inactivity and old age. Per pt, she used to exercise daily, however, discontinued due to bursitis.   Discussed importance of good PO intake to support healing. Pt denies any further nutrition-related concerns at this time.   Body mass index is 21.56 kg/m. Patient meets criteria for normal weight range based on current BMI.   Current diet order is Heart Healthy/ Carb Modified, patient is consuming approximately 75% of meals at  this time. Labs and medications reviewed.   No nutrition interventions warranted at this time. If nutrition issues arise, please consult RD.   Aylen Stradford A. Jimmye Norman, RD, LDN, CDE Pager: [phone] After hours Pager: [phone]

## 2016-04-06 NOTE — Care Management Note (Signed)
Case Management Note  Patient Details  Name: [name] MRN: [id number] Date of Birth: 1931-07-06  Subjective/Objective:            Patient presented with Left-sided numbness and weakness. Lives at home with family. CM will follow for discharge needs pending PT/OT evals and physician orders.  Action/Plan:   Expected Discharge Date:                  Expected Discharge Plan:     In-House Referral:     Discharge planning Services     Post Acute Care Choice:    Choice offered to:     DME Arranged:    DME Agency:     HH Arranged:    HH Agency:     Status of Service:     If discussed at [employer] of Stay Meetings, dates discussed:    Additional Comments:  Rolm Baptise, RN 04/06/2016, 12:05 PM

## 2016-04-06 NOTE — Discharge Instructions (Signed)
Follow with Primary MD [name], MD in 7 days   Get CBC, CMP, 2 view Chest X ray checked  by Primary MD or SNF MD in 5-7 days ( we routinely change or add medications that can affect your baseline labs and fluid status, therefore we recommend that you get the mentioned basic workup next visit with your PCP, your PCP may decide not to get them or add new tests based on their clinical decision)   Activity: As tolerated with Full fall precautions use walker/cane & assistance as needed   Disposition Home     Diet:   Diet heart healthy/carb    For Heart failure patients - Check your Weight same time everyday, if you gain over 2 pounds, or you develop in leg swelling, experience more shortness of breath or chest pain, call your Primary MD immediately. Follow Cardiac Low Salt Diet and 1.5 lit/day fluid restriction.   On your next visit with your primary care physician please Get Medicines reviewed and adjusted.   Please request your Prim.MD to go over all Hospital Tests and Procedure/Radiological results at the follow up, please get all Hospital records sent to your Prim MD by signing hospital release before you go home.   If you experience worsening of your admission symptoms, develop shortness of breath, life threatening emergency, suicidal or homicidal thoughts you must seek medical attention immediately by calling 911 or calling your MD immediately  if symptoms less severe.  You Must read complete instructions/literature along with all the possible adverse reactions/side effects for all the Medicines you take and that have been prescribed to you. Take any new Medicines after you have completely understood and accpet all the possible adverse reactions/side effects.   Do not drive, operate heavy machinery, perform activities at heights, swimming or participation in water activities or provide baby sitting services if your were admitted for syncope or siezures until you have seen by  Primary MD or a Neurologist and advised to do so again.  Do not drive when taking Pain medications.    Do not take more than prescribed Pain, Sleep and Anxiety Medications  Special Instructions: If you have smoked or chewed Tobacco  in the last 2 yrs please stop smoking, stop any regular Alcohol  and or any Recreational drug use.  Wear Seat belts while driving.   Please note  You were cared for by a hospitalist during your hospital stay. If you have any questions about your discharge medications or the care you received while you were in the hospital after you are discharged, you can call the unit and asked to speak with the hospitalist on call if the hospitalist that took care of you is not available. Once you are discharged, your primary care physician will handle any further medical issues. Please note that NO REFILLS for any discharge medications will be authorized once you are discharged, as it is imperative that you return to your primary care physician (or establish a relationship with a primary care physician if you do not have one) for your aftercare needs so that they can reassess your need for medications and monitor your lab values.

## 2016-04-06 NOTE — Discharge Summary (Signed)
[name] [id number] DOB: 01-07-1932 DOA: 04/05/2016  PCP: [name], MD  Admit date: 04/05/2016  Discharge date: 04/06/2016  Admitted From: Home  Disposition:  Home   Recommendations for Outpatient Follow-up:   Follow up with PCP in 1-2 weeks  PCP Please obtain BMP/CBC, 2 view CXR in 1week,  (see Discharge instructions)   PCP Please follow up on the following pending results: None   Home Health: None   Equipment/Devices: None  Consultations: Neuro Discharge Condition: Stable   CODE STATUS: Full   Diet Recommendation: Diet heart healthy/carb modified    Chief Complaint  Patient presents with  . Code Stroke     Brief history of present illness from the day of admission and additional interim summary    [name] is a 82 y.o. female with medical history significant of CVA, CAD/NSTEMI s/p CABG, HTN, HLD *(statin intolerance), presenting w/ a foggy headed feeling, confusion, and left-sided numbness and weakness. Patient reports awaking at 6 AM in her normal state of health. Patient's daughter noted that patient was acting somewhat confused during breakfast. Patient states she Really the same thing over and over the newspaper without any understanding as to what was being read. Approximately 09:00 patient developed left upper extremity and left lower extremity numbness and weakness. This persisted for approximately 10 minutes before beginning to resolve. EMS was called to evaluate patient who brought patient to the emergency room. Patient states that her left upper extremity left lower extremity numbness weakness has completely resolved but maintains some element of feeling "foggy headed."  Denies any recent chest pain, palpitations, shortness of breath, nausea, vomiting, headache, neck  stiffness, fevers, diarrhea, back pain, rash.  Hospital issues addressed     1. L sided numbness: Third similar episode, ruled out for stroke, MRA and CT angiogram showed only a small right ICA aneurysm which is stable, she is already on aspirin and Plavix for secondary prevention, she was seen by Dr. Erlinda Hong neurology who had discussed the case with.   Patient likely is having aura with atypical migraine explaining her symptoms, her LDL was 150 however she has multiple allergies hence I have started her on niacin. She will follow with her neurologist outpatient for secondary prevention and further management of this problem. We checked lower extremity venous duplex to rule out an embolic source and it was negative, she will be discharged home.   2. HTN: Continue home regimen.  3. CAD/MI: s/p CABG - continue ASA/Plavix      Discharge diagnosis     Active Problems:   S/P CABG x 3   TIA (transient ischemic attack)   Stroke-like symptoms   Left-sided weakness   Confusion    Discharge instructions    Discharge Instructions    Diet - low sodium heart healthy    Complete by:  As directed    Discharge instructions    Complete by:  As directed    Follow with Primary MD [name], MD in 7 days   Get  CBC, CMP, 2 view Chest X ray checked  by Primary MD or SNF MD in 5-7 days ( we routinely change or add medications that can affect your baseline labs and fluid status, therefore we recommend that you get the mentioned basic workup next visit with your PCP, your PCP may decide not to get them or add new tests based on their clinical decision)   Activity: As tolerated with Full fall precautions use walker/cane & assistance as needed   Disposition Home     Diet:   Diet heart healthy/carb    For Heart failure patients - Check your Weight same time everyday, if you gain over 2 pounds, or you develop in leg swelling, experience more shortness of breath or chest pain, call your  Primary MD immediately. Follow Cardiac Low Salt Diet and 1.5 lit/day fluid restriction.   On your next visit with your primary care physician please Get Medicines reviewed and adjusted.   Please request your Prim.MD to go over all Hospital Tests and Procedure/Radiological results at the follow up, please get all Hospital records sent to your Prim MD by signing hospital release before you go home.   If you experience worsening of your admission symptoms, develop shortness of breath, life threatening emergency, suicidal or homicidal thoughts you must seek medical attention immediately by calling 911 or calling your MD immediately  if symptoms less severe.  You Must read complete instructions/literature along with all the possible adverse reactions/side effects for all the Medicines you take and that have been prescribed to you. Take any new Medicines after you have completely understood and accpet all the possible adverse reactions/side effects.   Do not drive, operate heavy machinery, perform activities at heights, swimming or participation in water activities or provide baby sitting services if your were admitted for syncope or siezures until you have seen by Primary MD or a Neurologist and advised to do so again.  Do not drive when taking Pain medications.    Do not take more than prescribed Pain, Sleep and Anxiety Medications  Special Instructions: If you have smoked or chewed Tobacco  in the last 2 yrs please stop smoking, stop any regular Alcohol  and or any Recreational drug use.  Wear Seat belts while driving.   Please note  You were cared for by a hospitalist during your hospital stay. If you have any questions about your discharge medications or the care you received while you were in the hospital after you are discharged, you can call the unit and asked to speak with the hospitalist on call if the hospitalist that took care of you is not available. Once you are discharged, your  primary care physician will handle any further medical issues. Please note that NO REFILLS for any discharge medications will be authorized once you are discharged, as it is imperative that you return to your primary care physician (or establish a relationship with a primary care physician if you do not have one) for your aftercare needs so that they can reassess your need for medications and monitor your lab values.   Increase activity slowly    Complete by:  As directed       Discharge Medications     Medication List    TAKE these medications   acetaminophen 500 MG tablet Commonly known as:  TYLENOL Take 500 mg by mouth every 6 (six) hours as needed (for pain).   aspirin EC 81 MG tablet Take 81 mg by mouth at bedtime.  atenolol 25 MG tablet Commonly known as:  TENORMIN Take 25 mg by mouth at bedtime.   bimatoprost 0.03 % ophthalmic solution Commonly known as:  LUMIGAN Place 1 drop into both eyes at bedtime.   brimonidine 0.2 % ophthalmic solution Commonly known as:  ALPHAGAN Place 1 drop into both eyes 2 (two) times daily.   clopidogrel 75 MG tablet Commonly known as:  PLAVIX TAKE 1 TABLET BY MOUTH EVERY DAY What changed:  See the new instructions.   lisinopril 20 MG tablet Commonly known as:  PRINIVIL,ZESTRIL Take 20 mg by mouth at bedtime.   metroNIDAZOLE 0.75 % cream Commonly known as:  METROCREAM Apply 1 application topically 2 (two) times daily as needed (symptoms).   niacin 100 MG tablet Take 1 tablet (100 mg total) by mouth 2 (two) times daily with a meal.   Vitamin D3 2000 units capsule Take 2,000 Units by mouth at bedtime.       Follow-up Information    [name], MD. Schedule an appointment as soon as possible for a visit in 1 week(s).   Specialty:  Family Medicine Contact information: [place] [address] [phone]        Rob Hickman, MD. Schedule an appointment as soon as possible for a  visit in 1 week(s).   Specialty:  Interventional Radiology Why:  ICA aneurysm Contact information: [address] [place] [address] [phone]        Melvenia Beam, MD. Schedule an appointment as soon as possible for a visit in 1 week(s).   Specialty:  Neurology Contact information: [place] [address] [phone]           Major procedures and Radiology Reports - PLEASE review detailed and final reports thoroughly  -     Vascular Ultrasound Bilateral lower extremitiy venous duplex has been completed.  Preliminary findings: No evidence of deep vein thrombosis or baker's cysts bilaterally   Ct Angio Head W Or Wo Contrast  Result Date: 03/29/2016 CLINICAL DATA:  Transient ischemic attack. Follow-up RIGHT para ophthalmic aneurysm seen on prior imaging. History of hypertension. EXAM: CT ANGIOGRAPHY HEAD TECHNIQUE: Multidetector CT imaging of the head was performed using the standard protocol during bolus administration of intravenous contrast. Multiplanar CT image reconstructions and MIPs were obtained to evaluate the vascular anatomy. CONTRAST:  80 cc Isovue 370 COMPARISON:  MRI head March 02, 2016 and MRA head January 21, 2016 FINDINGS: CT HEAD BRAIN: The ventricles and sulci are normal for age. No intraparenchymal hemorrhage, mass effect nor midline shift. Patchy supratentorial white matter hypodensities within normal range for patient's age, though non-specific are most compatible with chronic small vessel ischemic disease. Old bilateral small cerebellar infarcts again noted. No acute large vascular territory infarcts. No abnormal extra-axial fluid collections. Basal cisterns are patent. VASCULAR: Moderate calcific atherosclerosis of the carotid siphons. SKULL: No skull fracture. 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 No significant scalp soft tissue swelling. SINUSES/ORBITS: The mastoid air-cells and included  paranasal sinuses are well-aerated.The included ocular globes and orbital contents are non-suspicious. OTHER: None. CTA HEAD ANTERIOR CIRCULATION: Normal appearance of the cervical internal carotid arteries, petrous, cavernous and supra clinoid internal carotid arteries. 4 mm medially directed RIGHT para ophthalmic aneurysm better seen on prior examination due to adjacent bony structures. Widely patent anterior communicating artery. Normal appearance of the anterior and middle cerebral arteries. Developmentally absent LEFT A1 segment, normal variant. Mild luminal regularity.  No large vessel occlusion, hemodynamically significant stenosis, dissection, contrast extravasation. POSTERIOR CIRCULATION: RIGHT vertebral artery is dominant with normal appearance of the vertebral arteries, vertebrobasilar junction and basilar artery, as well as main branch vessels. Mild luminal irregularity of the vertebral artery's mid basilar artery. Normal appearance of the posterior cerebral arteries. Robust LEFT posterior communicating artery present. No large vessel occlusion, hemodynamically significant stenosis, dissection, luminal irregularity, contrast extravasation or aneurysm. VENOUS SINUSES: Major dural venous sinuses are patent though not tailored for evaluation on this angiographic examination. ANATOMIC VARIANTS: None. DELAYED PHASE: No abnormal intracranial enhancement. IMPRESSION: CT HEAD:  No acute intracranial process. Stable chronic changes including multiple old cerebellar infarcts and moderate chronic small vessel ischemic disease. CTA HEAD: 4 mm intact RIGHT para ophthalmic aneurysm better characterized on prior MRA. Mild intracranial atherosclerosis without emergent large vessel occlusion or severe stenosis. Electronically Signed   By: Elon Alas M.D.   On: 03/29/2016 14:39   Ct Angio Neck W Or Wo Contrast  Result Date: 04/06/2016 CLINICAL DATA:  Sudden onset left-sided weakness with fusion and trouble  speaking. EXAM: CT ANGIOGRAPHY NECK TECHNIQUE: Multidetector CT imaging of the neck was performed using the standard protocol during bolus administration of intravenous contrast. Multiplanar CT image reconstructions and MIPs were obtained to evaluate the vascular anatomy. Carotid stenosis measurements (when applicable) are obtained utilizing NASCET criteria, using the distal internal carotid diameter as the denominator. CONTRAST:  80 cc Isovue 370 intravenous COMPARISON:  None. FINDINGS: Aortic arch: Diffuse atheromatous wall thickening and calcification. Three vessel branching. Partly seen changes of CABG. No flow limiting stenosis at the great vessel ostia. Right carotid system: Motion artifact at the proximal common carotid artery. Atherosclerotic plaque primarily at the bifurcation without flow limiting stenosis, dissection, or ulceration. Left carotid system: Atheromatous plaque that is mild for age and mainly at the carotid bifurcation and common carotid ostium. No flow limiting stenosis, dissection, or ulceration. Vertebral arteries: No flow limiting stenosis in the proximal subclavian arteries which do show atheromatous changes. Dominant right vertebral artery. Both vertebral arteries are smooth and widely patent to the dura. Skeleton: Advanced disc and facet degeneration. No acute or aggressive finding. Other neck: Nodular thyroid including solid and cystic lesion in the left lobe measuring 13 mm, stable from 2015 chest CT. This nodule is below size threshold for strict imaging follow-up per consensus criteria. Upper chest: CABG changes.  No acute finding. IMPRESSION: No acute finding. Atherosclerosis without flow limiting stenosis or ulceration seen in the cervical carotid or vertebral circulation. Electronically Signed   By: Monte Fantasia M.D.   On: 04/06/2016 10:04   Mr Brain Wo Contrast  Result Date: 04/05/2016 CLINICAL DATA:  Foggy headed feeling with confusion and LEFT-sided numbness and  weakness. Symptom onset earlier today. EXAM: MRI HEAD WITHOUT CONTRAST MRA HEAD WITHOUT CONTRAST TECHNIQUE: Multiplanar, multiecho pulse sequences of the brain and surrounding structures were obtained without intravenous contrast. Angiographic images of the head were obtained using MRA technique without contrast. COMPARISON:  Code stroke CT performed at 1130 hours. Aspects is 10. MRI brain 01/21/2016. FINDINGS: MRI HEAD FINDINGS Brain: No evidence for acute infarction, hemorrhage, mass lesion, hydrocephalus, or extra-axial fluid. Normal for age cerebral volume. Moderately advanced T2 and FLAIR hyperintensities throughout the white matter consistent with chronic microvascular ischemic change. Prominent perivascular spaces suggest hypertension as an etiology. BILATERAL chronic cerebellar infarcts. Vascular: Flow voids are maintained throughout the carotid, basilar, and vertebral arteries. There are no areas of chronic hemorrhage. Skull and upper cervical spine: Unremarkable  visualized calvarium, skullbase, and cervical vertebrae. Pituitary, pineal, cerebellar tonsils unremarkable. No upper cervical cord lesions. Exaggerated cervical kyphosis Sinuses/Orbits: No orbital masses or proptosis. Globes appear symmetric. Sinuses appear well aerated, without evidence for air-fluid level. Other: No nasopharyngeal pathology or mastoid fluid. Scalp and other visualized extracranial soft tissues grossly unremarkable. MRA HEAD FINDINGS Internal carotid arteries demonstrate mild skull base irregularity, without flow reducing stenosis. Dominant/sole A1 ACA on the RIGHT supplies both anterior cerebrals distally. Atretic LEFT A1 ACA. No M1 disease of significance. Basilar artery widely patent. RIGHT vertebral sole contributor. LEFT vertebral terminates in PICA. Fetal LEFT PCA. No posterior circulation stenosis or branch occlusion. 5 mm paraophthalmic aneurysm RIGHT ICA, moderate wide neck, projecting medially, stable. 3 mm outpouching  from the cavernous ICA on the LEFT, sessile outpouching, favored to represent atheromatous change. IMPRESSION: No acute stroke or hemorrhage. Atrophy, advanced small vessel disease, prominent perivascular spaces, and old cerebellar infarcts similar to priors. Hypertensive cerebrovascular disease is favored. No intracranial flow reducing stenosis. 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 Stable atheromatous change LEFT cavernous ICA . Electronically Signed   By: Staci Righter M.D.   On: 04/05/2016 15:27   Mr Jodene Nam Headm  Result Date: 04/05/2016 CLINICAL DATA:  Foggy headed feeling with confusion and LEFT-sided numbness and weakness. Symptom onset earlier today. EXAM: MRI HEAD WITHOUT CONTRAST MRA HEAD WITHOUT CONTRAST TECHNIQUE: Multiplanar, multiecho pulse sequences of the brain and surrounding structures were obtained without intravenous contrast. Angiographic images of the head were obtained using MRA technique without contrast. COMPARISON:  Code stroke CT performed at 1130 hours. Aspects is 10. MRI brain 01/21/2016. FINDINGS: MRI HEAD FINDINGS Brain: No evidence for acute infarction, hemorrhage, mass lesion, hydrocephalus, or extra-axial fluid. Normal for age cerebral volume. Moderately advanced T2 and FLAIR hyperintensities throughout the white matter consistent with chronic microvascular ischemic change. Prominent perivascular spaces suggest hypertension as an etiology. BILATERAL chronic cerebellar infarcts. Vascular: Flow voids are maintained throughout the carotid, basilar, and vertebral arteries. There are no areas of chronic hemorrhage. Skull and upper cervical spine: Unremarkable visualized calvarium, skullbase, and cervical vertebrae. Pituitary, pineal, cerebellar tonsils unremarkable. No upper cervical cord lesions. Exaggerated cervical kyphosis Sinuses/Orbits: No orbital masses or proptosis. Globes appear symmetric. Sinuses appear well aerated, without evidence for air-fluid level. Other: No  nasopharyngeal pathology or mastoid fluid. Scalp and other visualized extracranial soft tissues grossly unremarkable. MRA HEAD FINDINGS Internal carotid arteries demonstrate mild skull base irregularity, without flow reducing stenosis. Dominant/sole A1 ACA on the RIGHT supplies both anterior cerebrals distally. Atretic LEFT A1 ACA. No M1 disease of significance. Basilar artery widely patent. RIGHT vertebral sole contributor. LEFT vertebral terminates in PICA. Fetal LEFT PCA. No posterior circulation stenosis or branch occlusion. 5 mm paraophthalmic aneurysm RIGHT ICA, moderate wide neck, projecting medially, stable. 3 mm outpouching from the cavernous ICA on the LEFT, sessile outpouching, favored to represent atheromatous change. IMPRESSION: No acute stroke or hemorrhage. Atrophy, advanced small vessel disease, prominent perivascular spaces, and old cerebellar infarcts similar to priors. Hypertensive cerebrovascular disease is favored. No intracranial flow reducing stenosis. 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 Stable atheromatous change LEFT cavernous ICA . Electronically Signed   By: Staci Righter M.D.   On: 04/05/2016 15:27   Ct Head Code Stroke W/o Cm  Result Date: 04/05/2016 CLINICAL DATA:  Code stroke. LEFT-sided numbness and weakness which began earlier today. Symptoms have now begun to resolve. EXAM: CT HEAD WITHOUT CONTRAST TECHNIQUE: Contiguous axial images were obtained from the base of the  skull through the vertex without intravenous contrast. COMPARISON:  03/29/2016 CTA study. MR brain 03/02/2016. Noncontrast CT head 03/02/2016. FINDINGS: Brain: No evidence for acute infarction, hemorrhage, mass lesion, hydrocephalus, or extra-axial fluid. Chronic BILATERAL cerebellar infarcts. Cerebral atrophy. Hypoattenuation of white matter representing small vessel disease. Vascular: No hyperdense vessel. Mural calcification of the cavernous carotid arteries and RIGHT greater than LEFT vertebral  arteries is stable. Skull: Normal. Negative for fracture or focal lesion. Sinuses/Orbits: No acute finding. Other: None. ASPECTS [facility] Stroke Program Early CT Score) - Ganglionic level infarction (caudate, lentiform nuclei, internal capsule, insula, M1-M3 cortex): 7 - Supraganglionic infarction (M4-M6 cortex): 3 Total score (0-10 with 10 being normal): 10 Compared with priors, similar appearance. IMPRESSION: 1. Atrophy and small vessel disease. No acute intracranial findings. Stable appearance from prior imaging in November. 2. ASPECTS is 10. These results were sent by secure [facility] text message at the time of interpretation on 04/05/2016 at 11:43 am to Dr. Roland Rack. Electronically Signed   By: Staci Righter M.D.   On: 04/05/2016 11:47    Micro Results    No results found for this or any previous visit (from the past 240 hour(s)).  Today   Subjective    [name] today has no headache,no chest abdominal pain,no new weakness tingling or numbness, feels much better wants to go home today.    Objective   Blood pressure (!) 146/54, pulse 69, temperature 97.8 F (36.6 C), temperature source Oral, resp. rate 16, height 5\' 6"  (1.676 m), weight 60.6 kg (133 lb 9.6 oz), SpO2 97 %.   Intake/Output Summary (Last 24 hours) at 04/06/16 1439 Last data filed at 04/06/16 1438  Gross per 24 hour  Intake              240 ml  Output             1500 ml  Net            -1260 ml    Exam Awake Alert, Oriented x 3, No new F.N deficits, Normal affect [place].AT,PERRAL Supple Neck,No JVD, No cervical lymphadenopathy appriciated.  Symmetrical Chest wall movement, Good air movement bilaterally, CTAB RRR,No Gallops,Rubs or new Murmurs, No Parasternal Heave +ve B.Sounds, Abd Soft, Non tender, No organomegaly appriciated, No rebound -guarding or rigidity. No Cyanosis, Clubbing or edema, No new Rash or bruise   Data Review   CBC w Diff:  Lab Results  Component Value Date   WBC 4.8 04/06/2016     HGB 12.4 04/06/2016   HCT 37.5 04/06/2016   PLT 229 04/06/2016   LYMPHOPCT 34 04/05/2016   MONOPCT 6 04/05/2016   EOSPCT 1 04/05/2016   BASOPCT 0 04/05/2016    CMP:  Lab Results  Component Value Date   NA 142 04/06/2016   K 3.3 (L) 04/06/2016   CL 108 04/06/2016   CO2 26 04/06/2016   BUN 10 04/06/2016   CREATININE 0.75 04/06/2016   PROT 6.5 04/05/2016   ALBUMIN 4.2 04/05/2016   BILITOT 0.6 04/05/2016   ALKPHOS 68 04/05/2016   AST 19 04/05/2016   ALT 13 (L) 04/05/2016   Lipid Panel     Component Value Date/Time   CHOL 229 (H) 04/06/2016 1040   TRIG 103 04/06/2016 1040   HDL 58 04/06/2016 1040   CHOLHDL 3.9 04/06/2016 1040   VLDL 21 04/06/2016 1040   LDLCALC 150 (H) 04/06/2016 1040    Lab Results  Component Value Date   HGBA1C 5.4 01/22/2016  Total Time in preparing paper work, data evaluation and todays exam - 35 minutes  Thurnell Lose M.D on 04/06/2016 at 2:39 PM  [facility]   Office  [phone]

## 2016-04-06 NOTE — Care Management Obs Status (Signed)
[place] NOTIFICATION   Patient Details  Name: [name] MRN: [id number] Date of Birth: 1932/04/24   Medicare Observation Status Notification Given:  Yes    Pollie Friar, RN 04/06/2016, 2:33 PM

## 2016-04-06 NOTE — Progress Notes (Signed)
STROKE TEAM PROGRESS NOTE   HISTORY OF PRESENT ILLNESS (per record) [name] is a 82 y.o. female with a history of several recent episodes concerning for TIA. With the first one, she had difficulty speaking for about 10-15 minutes. With the second one, she had difficulty dialing the phone, transient confusion. With third, today, she describes that she had sudden onset of numbness and mild weakness of her left side. By the time of arrival, this was rapidly improving and by the time of my exam she was back to baseline. She was last known well 04/05/2016 at 9:45 AM. Patient was not administered IV t-PA secondary to resolved symptoms. She was admitted for further evaluation and treatment.   SUBJECTIVE (INTERVAL HISTORY) No family is at bedside. She recounted HPI with me. So far she had 3 episode of event. First one was in 12/2015 with aphasia, MRI EEG CUS and TTE all negative. Second one was in 03/2016 with confusion, loop recorder put in and so far no afib. EEG slowing. Third one was yesterday with left lip and cheek as well as left hand numbness. Again MRI and MRA negative for stroke. Aneurysm stable. Her symptoms more consistent with migraine equivalent.    OBJECTIVE Temp:  [97.8 F (36.6 C)-98.7 F (37.1 C)] 97.8 F (36.6 C) (12/07 1417) Pulse Rate:  [69-80] 69 (12/07 1417) Cardiac Rhythm: Heart block (12/07 0849) Resp:  [16-20] 16 (12/07 1417) BP: (146-190)/(54-88) 146/54 (12/07 1417) SpO2:  [97 %-100 %] 97 % (12/07 1417) Weight:  [60.6 kg (133 lb 9.6 oz)] 60.6 kg (133 lb 9.6 oz) (12/06 1708)  CBC:  Recent Labs Lab 04/05/16 1129 04/05/16 1135 04/06/16 0322  WBC 5.4  --  4.8  NEUTROABS 3.1  --   --   HGB 14.0 13.9 12.4  HCT 41.0 41.0 37.5  MCV 91.9  --  92.4  PLT 264  --  [id number]    Basic Metabolic Panel:  Recent Labs Lab 04/05/16 1129 04/05/16 1135 04/06/16 0322  NA 142 144 142  K 3.5 3.4* 3.3*  CL 107 106 108  CO2 25  --  26  GLUCOSE 109* 103* 85  BUN 11 14 10    CREATININE 0.84 0.80 0.75  CALCIUM 9.9  --  9.5    Lipid Panel:    Component Value Date/Time   CHOL 229 (H) 04/06/2016 1040   TRIG 103 04/06/2016 1040   HDL 58 04/06/2016 1040   CHOLHDL 3.9 04/06/2016 1040   VLDL 21 04/06/2016 1040   LDLCALC 150 (H) 04/06/2016 1040   HgbA1c:  Lab Results  Component Value Date   HGBA1C 5.4 01/22/2016   Urine Drug Screen:    Component Value Date/Time   LABOPIA NONE DETECTED 04/05/2016 1234   COCAINSCRNUR NONE DETECTED 04/05/2016 1234   LABBENZ NONE DETECTED 04/05/2016 1234   AMPHETMU NONE DETECTED 04/05/2016 1234   THCU NONE DETECTED 04/05/2016 1234   LABBARB NONE DETECTED 04/05/2016 1234      IMAGING I have personally reviewed the radiological images below and agree with the radiology interpretations.  Ct Head Code Stroke W/o Cm 04/05/2016 1. Atrophy and small vessel disease. No acute intracranial findings. Stable appearance from prior imaging in November. 2. ASPECTS is 10.   Ct Angio Neck W Or Wo Contrast 04/06/2016 No acute finding. Atherosclerosis without flow limiting stenosis or ulceration seen in the cervical carotid or vertebral circulation.   Mr Brain 54 Contrast Mr [name] 04/05/2016 No acute stroke or hemorrhage. Atrophy, advanced small  vessel disease, prominent perivascular spaces, and old cerebellar infarcts similar to priors. Hypertensive cerebrovascular disease is favored. No intracranial flow reducing stenosis. 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 Stable atheromatous change LEFT cavernous ICA .   LE venous doppler - No evidence of deep vein thrombosis or baker's cysts bilaterally   PHYSICAL EXAM  Temp:  [97.8 F (36.6 C)-98.6 F (37 C)] 97.8 F (36.6 C) (12/07 1417) Pulse Rate:  [69-78] 69 (12/07 1417) Resp:  [16-20] 16 (12/07 1417) BP: (146-178)/(54-78) 146/54 (12/07 1417) SpO2:  [97 %-99 %] 97 % (12/07 1417)  General - Well nourished, well developed, in no apparent distress.  Ophthalmologic -  Sharp disc margins OU.   Cardiovascular - Regular rate and rhythm.  Mental Status -  Level of arousal and orientation to time, place, and person were intact. Language including expression, naming, repetition, comprehension was assessed and found intact. Fund of Knowledge was assessed and was intact.  Cranial Nerves II - XII - II - Visual field intact OU. III, IV, VI - Extraocular movements intact. V - Facial sensation intact bilaterally. VII - Facial movement intact bilaterally. VIII - Hearing & vestibular intact bilaterally. X - Palate elevates symmetrically. XI - Chin turning & shoulder shrug intact bilaterally. XII - Tongue protrusion intact.  Motor Strength - The patient's strength was normal in all extremities and pronator drift was absent.  Bulk was normal and fasciculations were absent.   Motor Tone - Muscle tone was assessed at the neck and appendages and was normal.  Reflexes - The patient's reflexes were 1+ in all extremities and she had no pathological reflexes.  Sensory - Light touch, temperature/pinprick, vibration and proprioception, and Romberg testing were assessed and were symmetrical.    Coordination - The patient had normal movements in the hands and feet with no ataxia or dysmetria.  Tremor was absent.  Gait and Station - The patient's transfers, posture, gait, station, and turns were observed as normal.   ASSESSMENT/PLAN Ms. [name] is a 82 y.o. female with history of CVA, CAD/NSTEMI s/p CABG, HTN, HLD presenting with left-sided numbness and weakness. She did not receive IV t-PA due to resolved symptoms.   Migraine equivalent  Resultant  neurologic symptoms resolved  MRI  no acute stroke  MRA  no intracranial flow reducing stenosis. Stable R ICA paraophthalmic aneurysm.  CTA neck  no acute finding, no significant flow limiting stenosis   2D Echo  12/2015 EF 55%  Loop recorder interrogated - no atrial fibrillation  LDL 140  HgbA1c 5.4 in  September  Lovenox 40 mg sq daily for VTE prophylaxis  Diet heart healthy/carb modified Room service appropriate? Yes; Fluid consistency: Thin  Diet - low sodium heart healthy  aspirin 81 mg daily and clopidogrel 75 mg daily prior to admission, now on aspirin 325 mg daily and clopidogrel 75 mg daily. Continue on discharge.   Patient counseled to be compliant with her antithrombotic medications  Ongoing aggressive stroke risk factor management  Therapy recommendations:  No therapy needs  Disposition:  Return home (lives with granddaughter)  [name] for discharge from stroke standpoint  Hx of transient events - no retrograde consideration for migraine equivalent  12/2015 aphasia - MRI EEG CUS and TTE all negative  03/2016 confusion - MRI negative - CTA head no vessel occlusion - EEG slowing, put on loop recorder which showed no afib so far - pt was put on DAPT  Aneurysm, cerebral  Stable right 47mm paraophthalmic aneurysm  Stable  left 36mm cavernous ICA  Continue monitoring  Hypertension  STABLE  BP goal normotensive  Hyperlipidemia  Home meds:  No statin  Intolerant to statins  LDL 140, goal < 100  Follow up with PCP  Other Stroke Risk Factors  Advanced age  ETOH use, advised to drink no more than 1 drink(s) a day  CORONARY artery disease - mi, s/p CABG  Other Active Problems  Glaucoma  Mini mental status exam 23/30 in office in September. Question due to confusion or new onset dementia. Dr. Jaynee Eagles is following as an OP. EEG negative at that time  Hospital day # 0  Neurology will sign off. Please call with questions. Pt will follow up with Dr. Jaynee Eagles at [facility] on 05/09/16. Thanks for the consult.  Rosalin Hawking, MD PhD Stroke Neurology 04/06/2016 10:39 PM  To contact Stroke Continuity provider, please refer to [email or web address] After hours, contact General Neurology

## 2016-04-06 NOTE — Progress Notes (Signed)
*  PRELIMINARY RESULTS* Vascular Ultrasound Bilateral lower extremitiy venous duplex has been completed.  Preliminary findings: No evidence of deep vein thrombosis or baker's cysts bilaterally   Myrtie Cruise Alanis Clift 04/06/2016, 4:20 PM

## 2016-04-07 NOTE — Telephone Encounter (Signed)
[name]/[facility] [phone] said pt is in the hospital and will not be having PT or speech therapy this week. Granddaughter has been asked to call them to give an update when possible.

## 2016-04-10 DIAGNOSIS — Z1389 Encounter for screening for other disorder: Secondary | ICD-10-CM | POA: Diagnosis not present

## 2016-04-10 DIAGNOSIS — E559 Vitamin D deficiency, unspecified: Secondary | ICD-10-CM | POA: Diagnosis not present

## 2016-04-10 DIAGNOSIS — I10 Essential (primary) hypertension: Secondary | ICD-10-CM | POA: Diagnosis not present

## 2016-04-10 DIAGNOSIS — H539 Unspecified visual disturbance: Secondary | ICD-10-CM | POA: Diagnosis not present

## 2016-04-10 DIAGNOSIS — I2510 Atherosclerotic heart disease of native coronary artery without angina pectoris: Secondary | ICD-10-CM | POA: Diagnosis not present

## 2016-04-10 DIAGNOSIS — Z0001 Encounter for general adult medical examination with abnormal findings: Secondary | ICD-10-CM | POA: Diagnosis not present

## 2016-04-10 DIAGNOSIS — G459 Transient cerebral ischemic attack, unspecified: Secondary | ICD-10-CM | POA: Diagnosis not present

## 2016-04-10 DIAGNOSIS — E7800 Pure hypercholesterolemia, unspecified: Secondary | ICD-10-CM | POA: Diagnosis not present

## 2016-04-10 NOTE — Telephone Encounter (Signed)
[name]- please advise. This was sent to me on friday

## 2016-04-10 NOTE — Telephone Encounter (Signed)
Tried calling granddaughter, [name] back. LVM relaying AA,MD message below. Gave [facility] phone number if she has further questions.

## 2016-04-10 NOTE — Telephone Encounter (Signed)
Patient has a follow up early next month, I can talk to them then about this thanks. She went to the ED and was admitted, workup completed.

## 2016-04-11 ENCOUNTER — Encounter: Payer: Self-pay | Admitting: *Deleted

## 2016-04-11 DIAGNOSIS — H401112 Primary open-angle glaucoma, right eye, moderate stage: Secondary | ICD-10-CM | POA: Diagnosis not present

## 2016-04-11 DIAGNOSIS — I6782 Cerebral ischemia: Secondary | ICD-10-CM | POA: Diagnosis not present

## 2016-04-11 DIAGNOSIS — F0280 Dementia in other diseases classified elsewhere without behavioral disturbance: Secondary | ICD-10-CM | POA: Diagnosis not present

## 2016-04-11 DIAGNOSIS — I10 Essential (primary) hypertension: Secondary | ICD-10-CM | POA: Diagnosis not present

## 2016-04-11 DIAGNOSIS — H401122 Primary open-angle glaucoma, left eye, moderate stage: Secondary | ICD-10-CM | POA: Diagnosis not present

## 2016-04-11 DIAGNOSIS — G3183 Dementia with Lewy bodies: Secondary | ICD-10-CM | POA: Diagnosis not present

## 2016-04-11 DIAGNOSIS — I671 Cerebral aneurysm, nonruptured: Secondary | ICD-10-CM | POA: Diagnosis not present

## 2016-04-11 DIAGNOSIS — M7061 Trochanteric bursitis, right hip: Secondary | ICD-10-CM | POA: Diagnosis not present

## 2016-04-11 NOTE — Progress Notes (Signed)
Faxed signed orders back to [facility] for ST 2WK2. Fax: [phone]. Received confirmation.

## 2016-04-12 DIAGNOSIS — G3183 Dementia with Lewy bodies: Secondary | ICD-10-CM | POA: Diagnosis not present

## 2016-04-12 DIAGNOSIS — I10 Essential (primary) hypertension: Secondary | ICD-10-CM | POA: Diagnosis not present

## 2016-04-12 DIAGNOSIS — F0280 Dementia in other diseases classified elsewhere without behavioral disturbance: Secondary | ICD-10-CM | POA: Diagnosis not present

## 2016-04-12 DIAGNOSIS — I671 Cerebral aneurysm, nonruptured: Secondary | ICD-10-CM | POA: Diagnosis not present

## 2016-04-12 DIAGNOSIS — M7061 Trochanteric bursitis, right hip: Secondary | ICD-10-CM | POA: Diagnosis not present

## 2016-04-12 DIAGNOSIS — I6782 Cerebral ischemia: Secondary | ICD-10-CM | POA: Diagnosis not present

## 2016-04-13 ENCOUNTER — Telehealth: Payer: Self-pay | Admitting: *Deleted

## 2016-04-13 ENCOUNTER — Telehealth: Payer: Self-pay | Admitting: Neurology

## 2016-04-13 DIAGNOSIS — G3183 Dementia with Lewy bodies: Secondary | ICD-10-CM | POA: Diagnosis not present

## 2016-04-13 DIAGNOSIS — I10 Essential (primary) hypertension: Secondary | ICD-10-CM | POA: Diagnosis not present

## 2016-04-13 DIAGNOSIS — I671 Cerebral aneurysm, nonruptured: Secondary | ICD-10-CM | POA: Diagnosis not present

## 2016-04-13 DIAGNOSIS — M7061 Trochanteric bursitis, right hip: Secondary | ICD-10-CM | POA: Diagnosis not present

## 2016-04-13 DIAGNOSIS — F0280 Dementia in other diseases classified elsewhere without behavioral disturbance: Secondary | ICD-10-CM | POA: Diagnosis not present

## 2016-04-13 DIAGNOSIS — I6782 Cerebral ischemia: Secondary | ICD-10-CM | POA: Diagnosis not present

## 2016-04-13 NOTE — Telephone Encounter (Signed)
Sonal/[facility] [phone] calling for speech therapy, she has seen the pt twice. She has 1 visit left for tomorrow. Pt was at the hospital last week. She is alot more confused and having hallucinations. She would like to see her 2 x 3 starting next week.

## 2016-04-13 NOTE — Telephone Encounter (Signed)
Called [facility] back and gave VO per AA,MD to extend PT once a weekx1wk, 2x/week for 1 wk and then once a week for 1 week. She verbalized understanding and has no further questions.

## 2016-04-13 NOTE — Telephone Encounter (Signed)
Received call from [facility] requesting verbal order to extend physical therapy. The new frequency is  once a wk x 1 week, 2 x a week for 1 wk then once a week for 1 week. She is requesting a verbal order as soon as possible.  Routed to [name] RN.

## 2016-04-13 NOTE — Telephone Encounter (Signed)
Called and LVM for [facility] advising ok per AA,MD to extend ST as requested. Gave pt name/DOB and [facility] phone number if she has further questions/concerns.

## 2016-04-14 DIAGNOSIS — I10 Essential (primary) hypertension: Secondary | ICD-10-CM | POA: Diagnosis not present

## 2016-04-14 DIAGNOSIS — I671 Cerebral aneurysm, nonruptured: Secondary | ICD-10-CM | POA: Diagnosis not present

## 2016-04-14 DIAGNOSIS — I6782 Cerebral ischemia: Secondary | ICD-10-CM | POA: Diagnosis not present

## 2016-04-14 DIAGNOSIS — M7061 Trochanteric bursitis, right hip: Secondary | ICD-10-CM | POA: Diagnosis not present

## 2016-04-14 DIAGNOSIS — G3183 Dementia with Lewy bodies: Secondary | ICD-10-CM | POA: Diagnosis not present

## 2016-04-14 DIAGNOSIS — F0280 Dementia in other diseases classified elsewhere without behavioral disturbance: Secondary | ICD-10-CM | POA: Diagnosis not present

## 2016-04-17 DIAGNOSIS — I671 Cerebral aneurysm, nonruptured: Secondary | ICD-10-CM | POA: Diagnosis not present

## 2016-04-17 DIAGNOSIS — M7061 Trochanteric bursitis, right hip: Secondary | ICD-10-CM | POA: Diagnosis not present

## 2016-04-17 DIAGNOSIS — G3183 Dementia with Lewy bodies: Secondary | ICD-10-CM | POA: Diagnosis not present

## 2016-04-17 DIAGNOSIS — I10 Essential (primary) hypertension: Secondary | ICD-10-CM | POA: Diagnosis not present

## 2016-04-17 DIAGNOSIS — F0280 Dementia in other diseases classified elsewhere without behavioral disturbance: Secondary | ICD-10-CM | POA: Diagnosis not present

## 2016-04-17 DIAGNOSIS — I6782 Cerebral ischemia: Secondary | ICD-10-CM | POA: Diagnosis not present

## 2016-04-18 ENCOUNTER — Encounter: Payer: Self-pay | Admitting: *Deleted

## 2016-04-18 NOTE — Progress Notes (Signed)
Faxed signed orders back to [facility] for:PT 1W1. 2W1, 1W1 and ST 2W3. Fax: [phone]. Received confirmation.

## 2016-04-20 DIAGNOSIS — I10 Essential (primary) hypertension: Secondary | ICD-10-CM | POA: Diagnosis not present

## 2016-04-20 DIAGNOSIS — M7061 Trochanteric bursitis, right hip: Secondary | ICD-10-CM | POA: Diagnosis not present

## 2016-04-20 DIAGNOSIS — F0280 Dementia in other diseases classified elsewhere without behavioral disturbance: Secondary | ICD-10-CM | POA: Diagnosis not present

## 2016-04-20 DIAGNOSIS — I6782 Cerebral ischemia: Secondary | ICD-10-CM | POA: Diagnosis not present

## 2016-04-20 DIAGNOSIS — G3183 Dementia with Lewy bodies: Secondary | ICD-10-CM | POA: Diagnosis not present

## 2016-04-20 DIAGNOSIS — I671 Cerebral aneurysm, nonruptured: Secondary | ICD-10-CM | POA: Diagnosis not present

## 2016-04-26 DIAGNOSIS — I671 Cerebral aneurysm, nonruptured: Secondary | ICD-10-CM | POA: Diagnosis not present

## 2016-04-26 DIAGNOSIS — M7061 Trochanteric bursitis, right hip: Secondary | ICD-10-CM | POA: Diagnosis not present

## 2016-04-26 DIAGNOSIS — Z86018 Personal history of other benign neoplasm: Secondary | ICD-10-CM | POA: Diagnosis not present

## 2016-04-26 DIAGNOSIS — D225 Melanocytic nevi of trunk: Secondary | ICD-10-CM | POA: Diagnosis not present

## 2016-04-26 DIAGNOSIS — L814 Other melanin hyperpigmentation: Secondary | ICD-10-CM | POA: Diagnosis not present

## 2016-04-26 DIAGNOSIS — D1801 Hemangioma of skin and subcutaneous tissue: Secondary | ICD-10-CM | POA: Diagnosis not present

## 2016-04-26 DIAGNOSIS — L821 Other seborrheic keratosis: Secondary | ICD-10-CM | POA: Diagnosis not present

## 2016-04-26 DIAGNOSIS — I6782 Cerebral ischemia: Secondary | ICD-10-CM | POA: Diagnosis not present

## 2016-04-26 DIAGNOSIS — G3183 Dementia with Lewy bodies: Secondary | ICD-10-CM | POA: Diagnosis not present

## 2016-04-26 DIAGNOSIS — Z23 Encounter for immunization: Secondary | ICD-10-CM | POA: Diagnosis not present

## 2016-04-26 DIAGNOSIS — F0280 Dementia in other diseases classified elsewhere without behavioral disturbance: Secondary | ICD-10-CM | POA: Diagnosis not present

## 2016-04-26 DIAGNOSIS — L820 Inflamed seborrheic keratosis: Secondary | ICD-10-CM | POA: Diagnosis not present

## 2016-04-26 DIAGNOSIS — I10 Essential (primary) hypertension: Secondary | ICD-10-CM | POA: Diagnosis not present

## 2016-04-26 DIAGNOSIS — Z85828 Personal history of other malignant neoplasm of skin: Secondary | ICD-10-CM | POA: Diagnosis not present

## 2016-04-27 DIAGNOSIS — F0280 Dementia in other diseases classified elsewhere without behavioral disturbance: Secondary | ICD-10-CM | POA: Diagnosis not present

## 2016-04-27 DIAGNOSIS — I10 Essential (primary) hypertension: Secondary | ICD-10-CM | POA: Diagnosis not present

## 2016-04-27 DIAGNOSIS — I671 Cerebral aneurysm, nonruptured: Secondary | ICD-10-CM | POA: Diagnosis not present

## 2016-04-27 DIAGNOSIS — I6782 Cerebral ischemia: Secondary | ICD-10-CM | POA: Diagnosis not present

## 2016-04-27 DIAGNOSIS — G3183 Dementia with Lewy bodies: Secondary | ICD-10-CM | POA: Diagnosis not present

## 2016-04-27 DIAGNOSIS — M7061 Trochanteric bursitis, right hip: Secondary | ICD-10-CM | POA: Diagnosis not present

## 2016-04-28 DIAGNOSIS — F0280 Dementia in other diseases classified elsewhere without behavioral disturbance: Secondary | ICD-10-CM | POA: Diagnosis not present

## 2016-04-28 DIAGNOSIS — I671 Cerebral aneurysm, nonruptured: Secondary | ICD-10-CM | POA: Diagnosis not present

## 2016-04-28 DIAGNOSIS — I6782 Cerebral ischemia: Secondary | ICD-10-CM | POA: Diagnosis not present

## 2016-04-28 DIAGNOSIS — G3183 Dementia with Lewy bodies: Secondary | ICD-10-CM | POA: Diagnosis not present

## 2016-04-28 DIAGNOSIS — I10 Essential (primary) hypertension: Secondary | ICD-10-CM | POA: Diagnosis not present

## 2016-04-28 DIAGNOSIS — M7061 Trochanteric bursitis, right hip: Secondary | ICD-10-CM | POA: Diagnosis not present

## 2016-05-02 ENCOUNTER — Telehealth: Payer: Self-pay | Admitting: Diagnostic Neuroimaging

## 2016-05-02 ENCOUNTER — Ambulatory Visit (INDEPENDENT_AMBULATORY_CARE_PROVIDER_SITE_OTHER): Payer: Medicare Other | Admitting: *Deleted

## 2016-05-02 DIAGNOSIS — I671 Cerebral aneurysm, nonruptured: Secondary | ICD-10-CM | POA: Diagnosis not present

## 2016-05-02 DIAGNOSIS — I6782 Cerebral ischemia: Secondary | ICD-10-CM | POA: Diagnosis not present

## 2016-05-02 DIAGNOSIS — I10 Essential (primary) hypertension: Secondary | ICD-10-CM | POA: Diagnosis not present

## 2016-05-02 DIAGNOSIS — I639 Cerebral infarction, unspecified: Secondary | ICD-10-CM

## 2016-05-02 DIAGNOSIS — F0280 Dementia in other diseases classified elsewhere without behavioral disturbance: Secondary | ICD-10-CM | POA: Diagnosis not present

## 2016-05-02 DIAGNOSIS — G3183 Dementia with Lewy bodies: Secondary | ICD-10-CM | POA: Diagnosis not present

## 2016-05-02 DIAGNOSIS — M7061 Trochanteric bursitis, right hip: Secondary | ICD-10-CM | POA: Diagnosis not present

## 2016-05-02 NOTE — Telephone Encounter (Signed)
Pt called for advice on BP mgmt. 160/76. Denies chest pain, SOB, vision changes, or headaches. Advised for her to follow up with PCP on BP mgmt. -VRP

## 2016-05-03 NOTE — Progress Notes (Signed)
Carelink Summary Report 

## 2016-05-03 NOTE — Telephone Encounter (Signed)
Noted, thank you

## 2016-05-04 DIAGNOSIS — M7061 Trochanteric bursitis, right hip: Secondary | ICD-10-CM | POA: Diagnosis not present

## 2016-05-04 DIAGNOSIS — I10 Essential (primary) hypertension: Secondary | ICD-10-CM | POA: Diagnosis not present

## 2016-05-04 DIAGNOSIS — G3183 Dementia with Lewy bodies: Secondary | ICD-10-CM | POA: Diagnosis not present

## 2016-05-04 DIAGNOSIS — I6782 Cerebral ischemia: Secondary | ICD-10-CM | POA: Diagnosis not present

## 2016-05-04 DIAGNOSIS — I671 Cerebral aneurysm, nonruptured: Secondary | ICD-10-CM | POA: Diagnosis not present

## 2016-05-04 DIAGNOSIS — F0280 Dementia in other diseases classified elsewhere without behavioral disturbance: Secondary | ICD-10-CM | POA: Diagnosis not present

## 2016-05-08 ENCOUNTER — Other Ambulatory Visit: Payer: Self-pay | Admitting: Cardiology

## 2016-05-09 ENCOUNTER — Ambulatory Visit (INDEPENDENT_AMBULATORY_CARE_PROVIDER_SITE_OTHER): Payer: Medicare Other | Admitting: Neurology

## 2016-05-09 ENCOUNTER — Encounter: Payer: Self-pay | Admitting: Neurology

## 2016-05-09 VITALS — BP 182/70 | HR 74 | Ht 66.0 in | Wt 131.6 lb

## 2016-05-09 DIAGNOSIS — H539 Unspecified visual disturbance: Secondary | ICD-10-CM | POA: Diagnosis not present

## 2016-05-09 DIAGNOSIS — R404 Transient alteration of awareness: Secondary | ICD-10-CM

## 2016-05-09 DIAGNOSIS — H547 Unspecified visual loss: Secondary | ICD-10-CM | POA: Diagnosis not present

## 2016-05-09 DIAGNOSIS — I671 Cerebral aneurysm, nonruptured: Secondary | ICD-10-CM

## 2016-05-09 DIAGNOSIS — I639 Cerebral infarction, unspecified: Secondary | ICD-10-CM

## 2016-05-09 NOTE — Patient Instructions (Signed)
Remember to drink plenty of fluid, eat healthy meals and do not skip any meals. Try to eat protein with a every meal and eat a healthy snack such as fruit or nuts in between meals. Try to keep a regular sleep-wake schedule and try to exercise daily, particularly in the form of walking, 20-30 minutes a day, if you can.   As far as diagnostic testing: Referral to ophthalmology and surgeon for the aneurysms  I would like to see you back in 4-6 months, sooner if we need to. Please call us with any interim questions, concerns, problems, updates or refill requests.    Our phone number is [phone]. We also have an after hours call service for urgent matters and there is a physician on-call for urgent questions. For any emergencies you know to call 911 or go to the nearest emergency room

## 2016-05-09 NOTE — Progress Notes (Signed)
[facility]    Provider:  [name] Referring Provider: Carol Ada, MD Primary Care Physician:  [name], MD  CC: TIA  Interval history 05/09/2016: Her blood pressure varies from 123XX123 systolic up to 99991111 per patient and today it is 99991111 systolic. But often times it is low at home per patient. I have asked her document blood pressures daily and take them to her pcp for management of her BP medications . I discussed her memory today. Patient was having hallucinations, men and women in [place] garb, cats and dogs and little kids. They had decorated the credenza and sometimes they brush against her and she can feel it. She knows they are not real. It bothers her a little bit. 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 She is having significant vision changes, will refer to ophthalmology [name] for evaluation.  Sometimes the hallucinations would be on the wall other times int he middle of the room. She lives with her granddaughter. She pays her own bills but having more difficulty with paying bills, she is having a lot of vision changes which is impacting her. She can't read anymore. She is having difficulty with comprehension. She opens her purse and can't find the zipper because of sight. No more episodes of confusion per patient. Discussed an ambulatory 72-hour eeg but she declines. She denies any memory changes.   Interval history: Her blood pressure is extremely elevated, she has an appt this month, not symptomatic. She had an episode of confusion, couldn;t dial anybody, couldn;t remember anyone's names, She was in the house and no one else was there and she felt like she was having another episode. She remembers the whole event, she called 911 and the ambulance came. Repeat MRi did not show any new strokes. She has not had any episodes since then. She was able to talk to EMS. She has been dizzy after taking propranolol,  advised to discuss with primary including elevated blood prussure.  Will order CTA of the head to further examine aneurysms, warned she needs to manage her BP due to risk of rupture, follow up with pcp int he next few weeks. Friend with her, they both acknowledge understanding.  [id number] A Laytonis a 83 y.o.femalehere as a referral from Dr. Denzil Magnuson TIA. She has a past medical history of vitamin D deficiency, high cholesterol, hypertension, atherosclerotic heart disease, osteoarthritis, glaucoma, history of coronary artery bypass, TIA. Patient was out shopping and she suddenly couldn't talk, words wouldn;t come. No inciting event or previous illnesses or head trauma. MRi of the brain was negative for acute event. Friend is here and provides information. Friend says patient was confused for a short period of time afterwards, Patient couldn't find her keys, she was confused, had a difficult time finding money right in her purse, she couldn;t make complete sentences. Patient says she remembers it all, no LOC or altered awareness. She was thinking but she couldn't make words come out. Symptoms resolved completely. However she is also having hallucination recently, she is also seeing people, it just started a day and a half ago, she is seeing people one had a cat with them and she sawsomeone in the yard. They were in her bedroom all night. Just started since yesterday, in the past week patient is more confused, she was seen Monday with Dr. Tamala Julian and they say a work up for metabolic/infectios causes ruled out however they do not think urine  was taken. Patient is more confused in the last few weeks. She is having difficulty saying things, not completingsentences. More confusion. Also progressive memory changes the last year. Sister and mother with Alzheimer's dz  Cholesterol LDL 151. CMP 08/16/2015 with BUN 24 and creatinine 0.91.hgba1c 5.4.  Reviewed notes, labs and imaging from outside physicians,  which showed:  Personally reviewed MRI images of the brain and garee with the following: IMPRESSION: 1. No acute intracranial abnormality. 2. Moderate chronic small vessel ischemic disease. 3. Chronic cerebellar infarcts. 4. No major intracranial branch vessel occlusion or significant proximal stenosis. 5. 5 mm right ICA paraophthalmic aneurysm. 6. Possible 3 mm left ICA aneurysm near the petrous-cavernous Junction.  EEG normal. ECHO did not show any clots in the heart.   Review of Systems: Patient complains of symptoms per HPI as well as the following symptoms:weight loss, fatigue, blurred vision, easy bruising him a hearing loss, runny nose, memory loss, confusion, headache, numbness, weakness, dizziness, tremor, decreased energy, change in appetite, disinterest in activities ertinent negatives per HPI. All others negative.  Social History   Social History  . Marital status: Widowed    Spouse name: N/A  . Number of children: 2  . Years of education: 16+   Occupational History  . retired    Social History Main Topics  . Smoking status: Never Smoker  . Smokeless tobacco: Never Used  . Alcohol use 0.5 oz/week    1 Standard drinks or equivalent per week     Comment: 1 glass of liquor weekly   . Drug use: No  . Sexual activity: Not on file   Other Topics Concern  . Not on file   Social History Narrative   She is a widowed, mother of 2 grandmother of 3.    Lives with granddaughter.   Drinks coffee every morning. 1 cup.   She used to do exercising through the [employer] program as well as the [employer]. Unfortunately, this finding of the classes for seniors changed, and she was unable to make the new classes. She did not like exercising with non-seniors. Besides that she does existing disease, walks up and down the stairs, walk her dog. She is not as active as he had been before.   She never smoked, never drank alcohol.          Family History  Problem Relation Age  of Onset  . Alzheimer's disease Mother   . Alzheimer's disease Sister   . Hyperlipidemia Sister   . Hypertension Sister   . Diabetes Sister   . Hyperlipidemia Sister   . Hypertension Sister   . Pancreatitis Child     Past Medical History:  Diagnosis Date  . Arthritis   . Bursitis    hips bilat   . CAD (coronary artery disease), autologous vein bypass graft March 2008   Follow-up cath: December 2015 Occluded SVG-D1 and occluded SVG-RI.  [name] CAD in native artery   . Dyslipidemia    Statin intolerant  . Glaucoma   . Hypertension   . Non-STEMI (non-ST elevated myocardial infarction) [facility]) October 2005   EF by 35-40%, echo 40-50%. Angiography: 99% mid LAD involving D1 followed by 70% mid LAD; 80% RI. --> CABG  . PONV (postoperative nausea and vomiting)   . S/P CABG x 01 February 2004   LIMA-LAD, SVG-D1, SVG-RI.  [name] Statin intolerance   . Stroke [facility])     Past Surgical History:  Procedure Laterality Date  . BUNIONECTOMY  2002  . CARDIOVASCULAR STRESS TEST  05/23/2006   Mild lateral/inferolateral ischemia, likely due to occluded SVGs with exhisting disease.  [name] CAROTID DOPPLER  07/15/2009   Bilat ICAs 0-49% diameter reduction. Normal patency of Bilat subclavian arteries.  . CORONARY ARTERY BYPASS GRAFT  02/22/2004   x3. LIMA to distal LAD, SVG to first diag, SVG to ramus. SVG harvest from rt thigh.  . EP IMPLANTABLE DEVICE N/A 03/02/2016   Procedure: Loop Recorder Insertion;  Surgeon: Thompson Grayer, MD;  Location: [place] [facility];  Service: Cardiovascular;  Laterality: N/A;  . EXCISION/RELEASE BURSA HIP Right 06/15/2015   Procedure: RIGHT HIP BURSECTOMY WITH GLUTEAL TENDON REPAIR;  Surgeon: Gaynelle Arabian, MD;  Location: [facility];  Service: Orthopedics;  Laterality: Right;  . EYE SURGERY     cataract surgery bilat   . LEFT HEART CATHETERIZATION WITH CORONARY ANGIOGRAM  02/19/2004   Significant 2 vessel CAD - LAD, D1 and RI  . LEFT HEART CATHETERIZATION WITH CORONARY/GRAFT ANGIOGRAM  N/A 04/28/2014   Procedure: LEFT HEART CATHETERIZATION WITH Beatrix Fetters;  Surgeon: Sinclair Grooms, MD;  Location: [facility] CATH LAB: CTO of SVG-Diag & SVG-RI, patent LIMA-LAD. Patent native circumflex, LAD and RCA. 70% stenosis in a branch of RI. --> Does not explain "high risk perfusion study "  . LEFT HEART CATHETERIZATION WITH CORONARY/GRAFT ANGIOGRAM   07/25/2006   Totally occluded SVG to diag and ramus. Patent LIMA-LAD. Ramus 70-80% proximal stenosis and occluded vein graft.  [name] left total knee replacement      2001  . NM MYOVIEW LTD  January 2008; December 2015   a. Referred for For mild inferolateral and anterolateral/apical lateral defect with mild reversibility.;; b. Large defect in the anterior and inferior wall. Suggestive of potential infarct plus ischemia. HIGH RISK.  . right total knee replacement      2003  . TRANSTHORACIC ECHOCARDIOGRAM  02/19/2004; December 2015   a. EF 45-50%, Normal LV function, moderate hypokinesis of anterior wall.;; b. EF 50-55%. No RWMA, GR 1 DD. Normal valves     Current Outpatient Prescriptions  Medication Sig Dispense Refill  . acetaminophen (TYLENOL) 500 MG tablet Take 500 mg by mouth every 6 (six) hours as needed (for pain).     [name] aspirin EC 81 MG tablet Take 81 mg by mouth at bedtime.     [name] atenolol (TENORMIN) 25 MG tablet TAKE 1 TABLET (25 MG TOTAL) BY MOUTH DAILY. 30 tablet 11  . bimatoprost (LUMIGAN) 0.03 % ophthalmic solution Place 1 drop into both eyes at bedtime.    . brimonidine (ALPHAGAN) 0.2 % ophthalmic solution Place 1 drop into both eyes 2 (two) times daily.    . Cholecalciferol (VITAMIN D3) 2000 UNITS capsule Take 2,000 Units by mouth at bedtime.     . clopidogrel (PLAVIX) 75 MG tablet TAKE 1 TABLET BY MOUTH EVERY DAY 30 tablet 10  . lisinopril (PRINIVIL,ZESTRIL) 20 MG tablet Take 20 mg by mouth at bedtime.     . metroNIDAZOLE (METROCREAM) 0.75 % cream Apply 1 application topically 2 (two) times daily as needed (symptoms).        No current facility-administered medications for this visit.     Allergies as of 05/09/2016 - Review Complete 05/09/2016  Allergen Reaction Noted  . Nsaids Other (See Comments) 05/08/2013  . Statins Other (See Comments) 05/08/2013  . Tricor [fenofibrate] Other (See Comments) 05/08/2013  . Zetia [ezetimibe] Other (See Comments) 05/08/2013    Vitals: BP (!) 182/70 (BP Location: Right Arm,  Patient Position: Sitting, Cuff Size: Normal)   Pulse 74   Ht 5\' 6"  (1.676 m)   Wt 131 lb 9.6 oz (59.7 kg)   BMI 21.24 kg/m  Last Weight:  Wt Readings from Last 1 Encounters:  05/09/16 131 lb 9.6 oz (59.7 kg)   Last Height:   Ht Readings from Last 1 Encounters:  05/09/16 5\' 6"  (1.676 m)     Cranial Nerves: The pupils are equal, round, and reactive to light. Attempted fundoscopic exam could not visualiz. Visual fields are full to finger confrontation. Extraocular movements are intact. Trigeminal sensation is intact and the muscles of mastication are normal. The face is symmetric. The palate elevates in the midline. Hearing intact. Voice is normal. Shoulder shrug is normal. The tongue has normal motion without fasciculations.   Coordination: Normal finger to nose and heel to shin.  Gait: Antalgic due to right leg pain (says she had a surgery on the right hip tendon and since then difficulty walking)  Motor Observation: mild postural and head tremor. No resting tremor.  Tone: Mild cogwheeling right >left UE. [name]   Posture: Posture is normal. normal erect  Strength: Mild prox leg weakness bilat otherwise strength is V/V in the upper and lower limbs.   Sensation: intact to LT  Reflex Exam:  DTR's: Deep tendon reflexes in the upper and lower extremities are normal bilaterally.  Toes: The toes are downgoing bilaterally.  Clonus: Clonus is absent.     Assessment/Plan:83 y.o. femalehere as a referral from Dr. Denzil Magnuson TIA vs seizures. She  has a past medical history of vitamin D deficiency, high cholesterol, hypertension, atherosclerotic heart disease, osteoarthritis, glaucoma, coronary artery bypass. Here for evaluation of acute episodes of confusion which may be partial seizures vs TIA. Also more confused in the last few months with hallucinations. She is having significant vision changes as well, her pcp has suggested she may have Sherran Needs syndrome to explain the hallucinations which is a possibility but I am concerned she may have the beginning of a Lewy body dementia which can present with hallucinations, her MMSE was 23/30 and she appears to be having cognitive difficulties which she denies.    - Patient with multiple bilateral cerebellar infarcts already on plavix and aspirin. Cryptogenic may be embolic. Had loop recorder placed - 23/30 on MMSE with hallucinations, concerning for Lewy Body Dementia vs charles bonnet syndrome (will refer to ophtho) - 80mm and 94mm aneurysms, discussed with patient and friend and risk of bleeding especially with her elevated BP. Needs to follow with pcp and document her BPs at home. Will send for evaluation to [name].  - Discussed dementia, especially Lew body dementia given the hallucinations. Patient denies any significant cognitive changes.  - Had a routine EEG for recent confusion which was slowed, no epileptiform activity. If she has another incident of alteration of awareness without cardiac correlate, needs a 3-day eeg. She declines getting a prolonged eeg now, I have encouraged her but she declines. - Significant vision changes, Ophthalmology referral - if she has repeat strokes and loop does not record afib, then due to cryptogenic embolic strokes there is an option to start a blood thinner such as coumadin (could not use DOAcs not indicated for this). Discussed with her and she declines due to increased risks. -  I had a long d/w patient about her recent stroke, risk for  recurrent stroke/TIAs, personally independently reviewed imaging studies and stroke evaluation results and answered questions.Continue Plavix and asa for  secondary stroke prevention and maintain strict control of hypertension with blood pressure goal below 130/90, diabetes with hemoglobin A1c goal below 6.5% and lipids with LDL cholesterol goal below 70 mg/dL. I also advised the patient to eat a healthy diet with plenty of whole grains, cereals, fruits and vegetables, exercise regularly and maintain ideal body weight . Followup in the future with me in 4-6 months or call earlier if necessary.   Cc: [name], MD  Sarina Ill, MD  [facility] [address] [place], [place] [address]  Phone [phone] Fax [phone]  A total of 40 minutes was spent face-to-face with this patient. Over half this time was spent on counseling patient on the TIA, aneurysms diagnosis and different diagnostic and therapeutic options available.

## 2016-05-15 ENCOUNTER — Telehealth (HOSPITAL_COMMUNITY): Payer: Self-pay | Admitting: Radiology

## 2016-05-15 ENCOUNTER — Other Ambulatory Visit (HOSPITAL_COMMUNITY): Payer: Self-pay | Admitting: Interventional Radiology

## 2016-05-15 DIAGNOSIS — I671 Cerebral aneurysm, nonruptured: Secondary | ICD-10-CM

## 2016-05-15 NOTE — Telephone Encounter (Signed)
Called pt, left VM for her to call us back to schedule consult with [name] concerning her aneurysm. JM

## 2016-05-19 LAB — CUP PACEART REMOTE DEVICE CHECK
Date Time Interrogation Session: 20171202203555
MDC IDC PG IMPLANT DT: 20171102

## 2016-05-19 NOTE — Progress Notes (Signed)
Carelink summary report received. Battery status OK. Normal device function. No new symptom episodes, tachy episodes, brady, or pause episodes. No new AF episodes. Monthly summary reports and ROV/PRN 

## 2016-05-22 ENCOUNTER — Telehealth: Payer: Self-pay | Admitting: Neurology

## 2016-05-22 NOTE — Telephone Encounter (Signed)
Anderson Malta has called patient to schedule with Dr. Kathi Ludwig . I called and left Anderson Malta and left her a message relaying. Patient ready to schedule.

## 2016-05-24 ENCOUNTER — Ambulatory Visit (INDEPENDENT_AMBULATORY_CARE_PROVIDER_SITE_OTHER): Payer: Medicare Other | Admitting: Podiatry

## 2016-05-24 ENCOUNTER — Encounter: Payer: Self-pay | Admitting: Podiatry

## 2016-05-24 VITALS — Ht 66.0 in | Wt 131.0 lb

## 2016-05-24 DIAGNOSIS — L84 Corns and callosities: Secondary | ICD-10-CM

## 2016-05-24 DIAGNOSIS — Q828 Other specified congenital malformations of skin: Secondary | ICD-10-CM

## 2016-05-24 NOTE — Progress Notes (Signed)
Subjective:     Patient ID: [name], female   DOB: 05/02/1931, 83 y.o.   MRN: [id number]  HPIThis patient presentsa to the office for painful calluses under the ball of both feet.  She says calluses are painful walking and wearing shoes,.She presents for preventive footcare services.   Review of Systems     Objective:   Physical Exam GENERAL APPEARANCE: Alert, conversant. Appropriately groomed. No acute distress.  VASCULAR: Pedal pulses palpable at  DP and PT bilateral.  Capillary refill time is immediate to all digits,  Normal temperature gradient.  Digital hair growth is present bilateral  NEUROLOGIC: sensation is normal to 5.07 monofilament at 5/5 sites bilateral.  Light touch is intact bilateral, Muscle strength normal.  MUSCULOSKELETAL: acceptable muscle strength, tone and stability bilateral.  Intrinsic muscluature intact bilateral.  HAV 1st MPJ B/L.  Hammer toes 2-5 B/L.  DERMATOLOGIC: skin color, texture, and turgor are within normal limits.  No preulcerative lesions or ulcers  are seen, no interdigital maceration noted.  No open lesions present.  Digital nails are asymptomatic. No drainage noted. Porokeratosis noted sub 1 B/L.      Assessment:     Porokeratosis B/L     Plan:     Debride porokeratosis RTC 3 months   Shavy Beachem DPM       

## 2016-05-31 ENCOUNTER — Ambulatory Visit (INDEPENDENT_AMBULATORY_CARE_PROVIDER_SITE_OTHER): Payer: Medicare Other | Admitting: *Deleted

## 2016-05-31 DIAGNOSIS — I639 Cerebral infarction, unspecified: Secondary | ICD-10-CM

## 2016-06-01 ENCOUNTER — Encounter (HOSPITAL_COMMUNITY): Payer: Self-pay | Admitting: Radiology

## 2016-06-01 ENCOUNTER — Ambulatory Visit (HOSPITAL_COMMUNITY)
Admission: RE | Admit: 2016-06-01 | Discharge: 2016-06-01 | Disposition: A | Discharge status: Home / Self Care | Payer: Medicare Other | Source: Ambulatory Visit | Attending: Interventional Radiology | Admitting: Interventional Radiology

## 2016-06-01 DIAGNOSIS — I671 Cerebral aneurysm, nonruptured: Secondary | ICD-10-CM

## 2016-06-01 HISTORY — PX: IR GENERIC HISTORICAL: IMG1180011

## 2016-06-01 NOTE — Progress Notes (Signed)
[facility] Summary Report / Loop Recorder 

## 2016-06-02 ENCOUNTER — Encounter (HOSPITAL_COMMUNITY): Payer: Self-pay | Admitting: Interventional Radiology

## 2016-06-02 DIAGNOSIS — Z961 Presence of intraocular lens: Secondary | ICD-10-CM | POA: Diagnosis not present

## 2016-06-02 DIAGNOSIS — H43813 Vitreous degeneration, bilateral: Secondary | ICD-10-CM | POA: Diagnosis not present

## 2016-06-02 DIAGNOSIS — H401133 Primary open-angle glaucoma, bilateral, severe stage: Secondary | ICD-10-CM | POA: Diagnosis not present

## 2016-06-14 LAB — CUP PACEART REMOTE DEVICE CHECK
Implantable Pulse Generator Implant Date: 20171102
MDC IDC SESS DTM: 20180101210623

## 2016-06-14 NOTE — Progress Notes (Signed)
Carelink summary report received. Battery status OK. Normal device function. No new symptom episodes, tachy episodes, brady, or pause episodes. No new AF episodes. Monthly summary reports and ROV/PRN 

## 2016-06-28 ENCOUNTER — Other Ambulatory Visit (HOSPITAL_COMMUNITY): Payer: Self-pay | Admitting: Interventional Radiology

## 2016-06-28 ENCOUNTER — Other Ambulatory Visit: Payer: Self-pay | Admitting: Radiology

## 2016-06-28 DIAGNOSIS — I671 Cerebral aneurysm, nonruptured: Secondary | ICD-10-CM

## 2016-06-29 LAB — CUP PACEART REMOTE DEVICE CHECK
Date Time Interrogation Session: 20180131213635
MDC IDC PG IMPLANT DT: 20171102

## 2016-06-30 ENCOUNTER — Ambulatory Visit (INDEPENDENT_AMBULATORY_CARE_PROVIDER_SITE_OTHER): Payer: Medicare Other | Admitting: *Deleted

## 2016-06-30 DIAGNOSIS — I639 Cerebral infarction, unspecified: Secondary | ICD-10-CM | POA: Diagnosis not present

## 2016-07-03 NOTE — Progress Notes (Signed)
[facility] Summary Report / Loop Recorder 

## 2016-07-05 ENCOUNTER — Other Ambulatory Visit (HOSPITAL_COMMUNITY): Payer: Self-pay | Admitting: *Deleted

## 2016-07-05 NOTE — Pre-Procedure Instructions (Addendum)
[name]  07/05/2016    Your procedure is scheduled on Monday July 10, 2016 at 8:30 AM.   Report to [facility] Entrance "A" Admitting Office at 6:30 AM.   Call this number if you have problems the morning of surgery: [phone]   Questions prior to day of surgery, please call [phone] between 8 & 4 PM.   Remember:  Do not eat food or drink liquids after midnight Sunday, 07/09/16.  Take these medicines the morning of surgery with A SIP OF WATER: Clopidogrel (Plavix), may use eye drops, Tylenol - if needed   Do not wear jewelry, make-up or nail polish.  Do not wear lotions, powders or perfumes.  Do not shave 48 hours prior to surgery.   Do not bring valuables to the hospital.  [facility] is not responsible for any belongings or valuables.  Contacts, dentures or bridgework may not be worn into surgery.  Leave your suitcase in the car.  After surgery it may be brought to your room.  For patients admitted to the hospital, discharge time will be determined by your treatment team.  Patients discharged the day of surgery will not be allowed to drive home.   Special instructions:  [facility] - Preparing for Surgery  Before surgery, you can play an important role.  Because skin is not sterile, your skin needs to be as free of germs as possible.  You can reduce the number of germs on you skin by washing with CHG (chlorahexidine gluconate) soap before surgery.  CHG is an antiseptic cleaner which kills germs and bonds with the skin to continue killing germs even after washing.  Please DO NOT use if you have an allergy to CHG or antibacterial soaps.  If your skin becomes reddened/irritated stop using the CHG and inform your nurse when you arrive at Short Stay.  Do not shave (including legs and underarms) for at least 48 hours prior to the first CHG shower.  You may shave your face.  Please follow these instructions carefully:   1.  Shower with CHG Soap the night  before surgery and the                    morning of Surgery.  2.  If you choose to wash your hair, wash your hair first as usual with your       normal shampoo.  3.  After you shampoo, rinse your hair and body thoroughly to remove the shampoo.  4.  Use CHG as you would any other liquid soap.  You can apply chg directly       to the skin and wash gently with scrungie or a clean washcloth.  5.  Apply the CHG Soap to your body ONLY FROM THE NECK DOWN.        Do not use on open wounds or open sores.  Avoid contact with your eyes, ears, mouth and genitals (private parts).  Wash genitals (private parts) with your normal soap.  6.  Wash thoroughly, paying special attention to the area where your surgery        will be performed.  7.  Thoroughly rinse your body with warm water from the neck down.  8.  DO NOT shower/wash with your normal soap after using and rinsing off       the CHG Soap.  9.  Pat yourself dry with a clean towel.  10.  Wear clean pajamas.            11.  Place clean sheets on your bed the night of your first shower and do not        sleep with pets.  Day of Surgery  Do not apply any lotions the morning of surgery.  Please wear clean clothes to the hospital.   Please read over the fact sheets that you were given.

## 2016-07-06 ENCOUNTER — Encounter (HOSPITAL_COMMUNITY)
Admission: RE | Admit: 2016-07-06 | Discharge: 2016-07-06 | Disposition: A | Discharge status: Home / Self Care | Payer: Medicare Other | Source: Ambulatory Visit | Attending: Interventional Radiology | Admitting: Interventional Radiology

## 2016-07-06 ENCOUNTER — Encounter (HOSPITAL_COMMUNITY): Payer: Self-pay

## 2016-07-06 ENCOUNTER — Encounter (HOSPITAL_COMMUNITY): Payer: Self-pay | Admitting: Vascular Surgery

## 2016-07-06 DIAGNOSIS — Z951 Presence of aortocoronary bypass graft: Secondary | ICD-10-CM | POA: Diagnosis not present

## 2016-07-06 DIAGNOSIS — Z01812 Encounter for preprocedural laboratory examination: Secondary | ICD-10-CM | POA: Diagnosis not present

## 2016-07-06 DIAGNOSIS — I252 Old myocardial infarction: Secondary | ICD-10-CM | POA: Insufficient documentation

## 2016-07-06 DIAGNOSIS — Z7982 Long term (current) use of aspirin: Secondary | ICD-10-CM | POA: Insufficient documentation

## 2016-07-06 DIAGNOSIS — E785 Hyperlipidemia, unspecified: Secondary | ICD-10-CM | POA: Insufficient documentation

## 2016-07-06 DIAGNOSIS — I639 Cerebral infarction, unspecified: Secondary | ICD-10-CM | POA: Insufficient documentation

## 2016-07-06 DIAGNOSIS — I10 Essential (primary) hypertension: Secondary | ICD-10-CM | POA: Insufficient documentation

## 2016-07-06 DIAGNOSIS — I2510 Atherosclerotic heart disease of native coronary artery without angina pectoris: Secondary | ICD-10-CM | POA: Diagnosis not present

## 2016-07-06 HISTORY — DX: Malignant (primary) neoplasm, unspecified: C80.1

## 2016-07-06 HISTORY — DX: Personal history of urinary calculi: Z87.442

## 2016-07-06 LAB — CBC WITH DIFFERENTIAL/PLATELET
BASOS PCT: 0 %
Basophils Absolute: 0 10*3/uL (ref 0.0–0.1)
Eosinophils Absolute: 0.1 10*3/uL (ref 0.0–0.7)
Eosinophils Relative: 1 %
HEMATOCRIT: 41.8 % (ref 36.0–46.0)
HEMOGLOBIN: 14 g/dL (ref 12.0–15.0)
LYMPHS ABS: 1.6 10*3/uL (ref 0.7–4.0)
Lymphocytes Relative: 33 %
MCH: 31.3 pg (ref 26.0–34.0)
MCHC: 33.5 g/dL (ref 30.0–36.0)
MCV: 93.3 fL (ref 78.0–100.0)
MONO ABS: 0.3 10*3/uL (ref 0.1–1.0)
MONOS PCT: 6 %
NEUTROS PCT: 60 %
Neutro Abs: 2.9 10*3/uL (ref 1.7–7.7)
Platelets: 262 10*3/uL (ref 150–400)
RBC: 4.48 MIL/uL (ref 3.87–5.11)
RDW: 13.2 % (ref 11.5–15.5)
WBC: 4.8 10*3/uL (ref 4.0–10.5)

## 2016-07-06 LAB — COMPREHENSIVE METABOLIC PANEL
ALBUMIN: 4.2 g/dL (ref 3.5–5.0)
ALK PHOS: 75 U/L (ref 38–126)
ALT: 11 U/L — ABNORMAL LOW (ref 14–54)
ANION GAP: 8 (ref 5–15)
AST: 16 U/L (ref 15–41)
BILIRUBIN TOTAL: 0.7 mg/dL (ref 0.3–1.2)
BUN: 14 mg/dL (ref 6–20)
CALCIUM: 9.7 mg/dL (ref 8.9–10.3)
CO2: 25 mmol/L (ref 22–32)
Chloride: 108 mmol/L (ref 101–111)
Creatinine, Ser: 0.81 mg/dL (ref 0.44–1.00)
GLUCOSE: 105 mg/dL — AB (ref 65–99)
POTASSIUM: 4.1 mmol/L (ref 3.5–5.1)
Sodium: 141 mmol/L (ref 135–145)
TOTAL PROTEIN: 6.9 g/dL (ref 6.5–8.1)

## 2016-07-06 LAB — PROTIME-INR
INR: 1
PROTHROMBIN TIME: 13.2 s (ref 11.4–15.2)

## 2016-07-06 LAB — APTT: APTT: 27 s (ref 24–36)

## 2016-07-06 NOTE — Progress Notes (Signed)
Pt. Questioning the consent for the procedure; she will wait & speak with PA &/or MD the day of procedure for signing.  Pt. Has cardiac history & currently with a LOOP recorder due to recent stroke  symptoms.  Pt. Currently taking Plavix & aspirin.

## 2016-07-07 ENCOUNTER — Other Ambulatory Visit: Payer: Self-pay | Admitting: Physician Assistant

## 2016-07-07 ENCOUNTER — Telehealth (HOSPITAL_COMMUNITY): Payer: Self-pay | Admitting: Radiology

## 2016-07-07 NOTE — Telephone Encounter (Signed)
Called pt, left VM that we are cancelling her procedure that is scheduled for Monday, July 11, 2016. I asked her to call me back to reschedule this at her earliest convenience. JM

## 2016-07-07 NOTE — Progress Notes (Signed)
Anesthesia Chart Review:  Pt is an 83 year old female scheduled for embolization of aneurysm on 07/10/2016 with Luanne Bras, MD.   Cardiologist is Glenetta Hew, MD, last office visit 02/17/16; f/u in 1 year recommended  PMH includes:  CAD (S/p NSTEMI, CABG x3 2005), stroke, TIA, loop recorder (inserted 03/02/16), HTN, hyperlipidemia, glaucoma, post-op N/V. Never smoker. BMI 21. S/p R hip bursectomy 06/15/15.   Medications include: ASA 81 mg, atenolol, Plavix, lisinopril  Preoperative labs reviewed.  p2y12 will be obtained DOS.   EKG 04/05/16: Sinus rhythm with PACs  Echo 01/22/16:  - Left ventricle: The cavity size was normal. There was mild concentric hypertrophy. Systolic function was normal. The estimated ejection fraction was 55%. Wall motion was normal; there were no regional wall motion abnormalities. Features are consistent with a pseudonormal left ventricular filling pattern, with concomitant abnormal relaxation and increased filling pressure (grade 2 diastolic dysfunction). - Mitral valve: There was mild regurgitation. - Tricuspid valve: There was trivial regurgitation.  Carotid duplex 01/22/16: Bilateral: intimal wall thickening CCA. Mild to moderate calcific plaque origin ICA. 1-39% ICA plaquing. Vertebral artery flow is antegrade.  Cardiac cath 04/28/14 (for abnormal stress test): 1. Chronic occlusion of SVG  to the diagonal and SVG to the ramus intermedius . 2. Widely patent  Native circumflex, LAD, and RCA. 3. Widely patent ramus intermedius with perhaps a 70% stenosis in a branch of the main vessel. 4. Normal left ventricular systolic function 5. When compared to the angiography performed in 2008, no changes occurred.  If labs acceptable DOS, I anticipate pt can proceed as scheduled.   Erica Cass, FNP-BC [facility]/Anesthesiology Phone: [phone] 07/07/2016 9:11 AM

## 2016-07-10 ENCOUNTER — Ambulatory Visit (HOSPITAL_COMMUNITY)
Admission: RE | Admit: 2016-07-10 | Payer: Medicare Other | Source: Ambulatory Visit | Admitting: Interventional Radiology

## 2016-07-10 ENCOUNTER — Ambulatory Visit (HOSPITAL_COMMUNITY): Admission: RE | Admit: 2016-07-10 | Payer: Medicare Other | Source: Ambulatory Visit

## 2016-07-10 ENCOUNTER — Encounter (HOSPITAL_COMMUNITY): Admission: RE | Payer: Self-pay | Source: Ambulatory Visit

## 2016-07-10 ENCOUNTER — Encounter (HOSPITAL_COMMUNITY): Payer: Self-pay

## 2016-07-10 SURGERY — RADIOLOGY WITH ANESTHESIA
Anesthesia: General

## 2016-07-14 DIAGNOSIS — H43813 Vitreous degeneration, bilateral: Secondary | ICD-10-CM | POA: Diagnosis not present

## 2016-07-14 DIAGNOSIS — Z961 Presence of intraocular lens: Secondary | ICD-10-CM | POA: Diagnosis not present

## 2016-07-14 DIAGNOSIS — H401133 Primary open-angle glaucoma, bilateral, severe stage: Secondary | ICD-10-CM | POA: Diagnosis not present

## 2016-07-18 LAB — CUP PACEART REMOTE DEVICE CHECK
Date Time Interrogation Session: 20180302213757
Implantable Pulse Generator Implant Date: 20171102

## 2016-07-26 ENCOUNTER — Other Ambulatory Visit: Payer: Self-pay | Admitting: Radiology

## 2016-07-26 ENCOUNTER — Ambulatory Visit (HOSPITAL_COMMUNITY): Admission: RE | Admit: 2016-07-26 | Payer: Medicare Other | Source: Ambulatory Visit

## 2016-07-31 ENCOUNTER — Ambulatory Visit (INDEPENDENT_AMBULATORY_CARE_PROVIDER_SITE_OTHER): Payer: Medicare Other | Admitting: *Deleted

## 2016-07-31 DIAGNOSIS — I639 Cerebral infarction, unspecified: Secondary | ICD-10-CM | POA: Diagnosis not present

## 2016-07-31 NOTE — Progress Notes (Signed)
[facility] Summary Report / Loop Recorder 

## 2016-08-03 ENCOUNTER — Encounter (HOSPITAL_COMMUNITY): Payer: Self-pay | Admitting: *Deleted

## 2016-08-03 LAB — CUP PACEART REMOTE DEVICE CHECK
Date Time Interrogation Session: 20180401220630
MDC IDC PG IMPLANT DT: 20171102

## 2016-08-03 NOTE — Progress Notes (Signed)
Pt denies SOB and chest pain. Pt under the care of Dr. Ellyn Hack, Cardiology. Pt denies having a chest x ray within the last year. Pt made aware to stop taking vitamins, fish oil, and herbal medications. Do not take any NSAIDs ie: Ibuprofen, Advil, Naproxen, BC and Goody Powder. Pt verbalized understanding of all pre-op instructions.

## 2016-08-04 ENCOUNTER — Encounter: Payer: Self-pay | Admitting: Podiatry

## 2016-08-04 ENCOUNTER — Ambulatory Visit (INDEPENDENT_AMBULATORY_CARE_PROVIDER_SITE_OTHER): Payer: Medicare Other | Admitting: Podiatry

## 2016-08-04 DIAGNOSIS — Q828 Other specified congenital malformations of skin: Secondary | ICD-10-CM | POA: Diagnosis not present

## 2016-08-04 NOTE — Progress Notes (Signed)
Subjective:     Patient ID: [name], female   DOB: February 11, 1932, 83 y.o.   MRN: [id number]  HPIThis patient presentsa to the office for painful calluses under the ball of both feet.  She says calluses are painful walking and wearing shoes,.She presents for preventive footcare services.   Review of Systems     Objective:   Physical Exam GENERAL APPEARANCE: Alert, conversant. Appropriately groomed. No acute distress.  VASCULAR: Pedal pulses palpable at  [facility] and PT bilateral.  Capillary refill time is immediate to all digits,  Normal temperature gradient.  Digital hair growth is present bilateral  NEUROLOGIC: sensation is normal to 5.07 monofilament at 5/5 sites bilateral.  Light touch is intact bilateral, Muscle strength normal.  MUSCULOSKELETAL: acceptable muscle strength, tone and stability bilateral.  Intrinsic muscluature intact bilateral.  HAV 1st MPJ B/L.  Hammer toes 2-5 B/L.  DERMATOLOGIC: skin color, texture, and turgor are within normal limits.  No preulcerative lesions or ulcers  are seen, no interdigital maceration noted.  No open lesions present.  Digital nails are asymptomatic. No drainage noted. Porokeratosis noted sub 1 B/L.      Assessment:     Porokeratosis B/L     Plan:     Debride porokeratosis RTC 3 months   Gardiner Barefoot DPM

## 2016-08-07 ENCOUNTER — Ambulatory Visit (HOSPITAL_COMMUNITY): Payer: Medicare Other | Admitting: Anesthesiology

## 2016-08-07 ENCOUNTER — Other Ambulatory Visit: Payer: Self-pay | Admitting: Radiology

## 2016-08-07 ENCOUNTER — Encounter (HOSPITAL_COMMUNITY): Payer: Self-pay | Admitting: *Deleted

## 2016-08-07 ENCOUNTER — Ambulatory Visit (HOSPITAL_COMMUNITY)
Admission: RE | Admit: 2016-08-07 | Discharge: 2016-08-07 | Disposition: A | Discharge status: Home / Self Care | Payer: Medicare Other | Source: Ambulatory Visit | Attending: Interventional Radiology | Admitting: Interventional Radiology

## 2016-08-07 ENCOUNTER — Encounter (HOSPITAL_COMMUNITY)
Admission: RE | Disposition: A | Discharge status: Home / Self Care | Payer: Self-pay | Source: Ambulatory Visit | Attending: Interventional Radiology

## 2016-08-07 ENCOUNTER — Other Ambulatory Visit (HOSPITAL_COMMUNITY): Payer: Medicare Other

## 2016-08-07 DIAGNOSIS — Z96653 Presence of artificial knee joint, bilateral: Secondary | ICD-10-CM

## 2016-08-07 DIAGNOSIS — Z8673 Personal history of transient ischemic attack (TIA), and cerebral infarction without residual deficits: Secondary | ICD-10-CM | POA: Insufficient documentation

## 2016-08-07 DIAGNOSIS — Z7902 Long term (current) use of antithrombotics/antiplatelets: Secondary | ICD-10-CM

## 2016-08-07 DIAGNOSIS — Z79899 Other long term (current) drug therapy: Secondary | ICD-10-CM

## 2016-08-07 DIAGNOSIS — I252 Old myocardial infarction: Secondary | ICD-10-CM | POA: Insufficient documentation

## 2016-08-07 DIAGNOSIS — I6523 Occlusion and stenosis of bilateral carotid arteries: Secondary | ICD-10-CM | POA: Diagnosis not present

## 2016-08-07 DIAGNOSIS — I671 Cerebral aneurysm, nonruptured: Secondary | ICD-10-CM | POA: Insufficient documentation

## 2016-08-07 DIAGNOSIS — H409 Unspecified glaucoma: Secondary | ICD-10-CM | POA: Insufficient documentation

## 2016-08-07 DIAGNOSIS — Z7982 Long term (current) use of aspirin: Secondary | ICD-10-CM

## 2016-08-07 DIAGNOSIS — Z951 Presence of aortocoronary bypass graft: Secondary | ICD-10-CM

## 2016-08-07 DIAGNOSIS — I10 Essential (primary) hypertension: Secondary | ICD-10-CM

## 2016-08-07 DIAGNOSIS — I2510 Atherosclerotic heart disease of native coronary artery without angina pectoris: Secondary | ICD-10-CM | POA: Diagnosis not present

## 2016-08-07 DIAGNOSIS — E785 Hyperlipidemia, unspecified: Secondary | ICD-10-CM | POA: Diagnosis not present

## 2016-08-07 DIAGNOSIS — I25810 Atherosclerosis of coronary artery bypass graft(s) without angina pectoris: Secondary | ICD-10-CM | POA: Diagnosis not present

## 2016-08-07 HISTORY — PX: IR ANGIO VERTEBRAL SEL SUBCLAVIAN INNOMINATE BILAT MOD SED: IMG5366

## 2016-08-07 HISTORY — PX: IR ANGIO INTRA EXTRACRAN SEL COM CAROTID INNOMINATE BILAT MOD SED: IMG5360

## 2016-08-07 HISTORY — DX: Aneurysm of unspecified site: I72.9

## 2016-08-07 HISTORY — PX: RADIOLOGY WITH ANESTHESIA: SHX6223

## 2016-08-07 LAB — COMPREHENSIVE METABOLIC PANEL WITH GFR
ALT: 10 U/L — ABNORMAL LOW (ref 14–54)
AST: 17 U/L (ref 15–41)
Albumin: 3.8 g/dL (ref 3.5–5.0)
Alkaline Phosphatase: 81 U/L (ref 38–126)
Anion gap: 12 (ref 5–15)
BUN: 12 mg/dL (ref 6–20)
CO2: 25 mmol/L (ref 22–32)
Calcium: 9.7 mg/dL (ref 8.9–10.3)
Chloride: 106 mmol/L (ref 101–111)
Creatinine, Ser: 0.78 mg/dL (ref 0.44–1.00)
GFR calc Af Amer: >60 mL/min
GFR calc non Af Amer: >60 mL/min
Glucose, Bld: 102 mg/dL — ABNORMAL HIGH (ref 65–99)
Potassium: 3.2 mmol/L — ABNORMAL LOW (ref 3.5–5.1)
Sodium: 143 mmol/L (ref 135–145)
Total Bilirubin: 0.6 mg/dL (ref 0.3–1.2)
Total Protein: 6.2 g/dL — ABNORMAL LOW (ref 6.5–8.1)

## 2016-08-07 LAB — APTT: aPTT: 27 s (ref 24–36)

## 2016-08-07 LAB — CBC WITH DIFFERENTIAL/PLATELET
Basophils Absolute: 0 10*3/uL (ref 0.0–0.1)
Basophils Relative: 0 %
Eosinophils Absolute: 0.1 10*3/uL (ref 0.0–0.7)
Eosinophils Relative: 2 %
HCT: 39.8 % (ref 36.0–46.0)
Hemoglobin: 13.3 g/dL (ref 12.0–15.0)
Lymphocytes Relative: 29 %
Lymphs Abs: 1.4 10*3/uL (ref 0.7–4.0)
MCH: 30.9 pg (ref 26.0–34.0)
MCHC: 33.4 g/dL (ref 30.0–36.0)
MCV: 92.3 fL (ref 78.0–100.0)
Monocytes Absolute: 0.5 10*3/uL (ref 0.1–1.0)
Monocytes Relative: 10 %
Neutro Abs: 2.9 10*3/uL (ref 1.7–7.7)
Neutrophils Relative %: 59 %
Platelets: 220 10*3/uL (ref 150–400)
RBC: 4.31 MIL/uL (ref 3.87–5.11)
RDW: 12.8 % (ref 11.5–15.5)
WBC: 5 10*3/uL (ref 4.0–10.5)

## 2016-08-07 LAB — PROTIME-INR
INR: 0.99
Prothrombin Time: 13.1 s (ref 11.4–15.2)

## 2016-08-07 LAB — PLATELET INHIBITION P2Y12: Platelet Function  P2Y12: 178 [PRU] — ABNORMAL LOW (ref 194–418)

## 2016-08-07 SURGERY — RADIOLOGY WITH ANESTHESIA
Anesthesia: Monitor Anesthesia Care

## 2016-08-07 MED ORDER — MIDAZOLAM HCL 5 MG/5ML IJ SOLN
INTRAMUSCULAR | Status: DC | PRN
  Administered 2016-08-07: 1 mg via INTRAVENOUS

## 2016-08-07 MED ORDER — CEFAZOLIN SODIUM-DEXTROSE 2-4 GM/100ML-% IV SOLN
2.0000 g | INTRAVENOUS | Status: DC
  Filled 2016-08-07: qty 100

## 2016-08-07 MED ORDER — CLOPIDOGREL BISULFATE 75 MG PO TABS: 75.0000 mg | ORAL_TABLET | ORAL | Status: DC

## 2016-08-07 MED ORDER — NITROGLYCERIN 1 MG/10 ML FOR IR/CATH LAB
INTRA_ARTERIAL | Status: AC
  Filled 2016-08-07: qty 10

## 2016-08-07 MED ORDER — SODIUM CHLORIDE 0.9 % IV SOLN: INTRAVENOUS | Status: AC

## 2016-08-07 MED ORDER — LIDOCAINE HCL 1 % IJ SOLN
INTRAMUSCULAR | Status: AC | PRN
  Administered 2016-08-07: 10 mL

## 2016-08-07 MED ORDER — SODIUM CHLORIDE 0.9 % IV SOLN
INTRAVENOUS | Status: DC
  Administered 2016-08-07: 09:00:00 via INTRAVENOUS

## 2016-08-07 MED ORDER — FENTANYL CITRATE (PF) 100 MCG/2ML IJ SOLN: 25.0000 ug | INTRAMUSCULAR | Status: DC | PRN

## 2016-08-07 MED ORDER — SODIUM CHLORIDE 0.9 % IV SOLN
INTRAVENOUS | Status: DC
  Administered 2016-08-07: 08:00:00 via INTRAVENOUS

## 2016-08-07 MED ORDER — CLOPIDOGREL BISULFATE 75 MG PO TABS
75.0000 mg | ORAL_TABLET | Freq: Every day | ORAL | Status: DC
  Filled 2016-08-07 (×2): qty 1

## 2016-08-07 MED ORDER — CLOPIDOGREL BISULFATE 75 MG PO TABS
75.0000 mg | ORAL_TABLET | ORAL | Status: AC
  Administered 2016-08-07: 75 mg via ORAL
  Filled 2016-08-07: qty 1

## 2016-08-07 MED ORDER — ONDANSETRON HCL 4 MG/2ML IJ SOLN
INTRAMUSCULAR | Status: DC | PRN
  Administered 2016-08-07: 4 mg via INTRAVENOUS

## 2016-08-07 MED ORDER — ASPIRIN 325 MG PO TABS
325.0000 mg | ORAL_TABLET | Freq: Every day | ORAL | Status: DC
  Filled 2016-08-07: qty 1

## 2016-08-07 MED ORDER — FENTANYL CITRATE (PF) 100 MCG/2ML IJ SOLN
INTRAMUSCULAR | Status: DC | PRN
Start: 2016-08-07 — End: 2016-08-07
  Administered 2016-08-07: 25 ug via INTRAVENOUS

## 2016-08-07 MED ORDER — NIMODIPINE 30 MG PO CAPS: 0.0000 mg | ORAL_CAPSULE | ORAL | Status: DC

## 2016-08-07 MED ORDER — NITROGLYCERIN 0.2 MG/ML ON CALL CATH LAB
INTRAVENOUS | Status: DC | PRN
  Administered 2016-08-07 (×2): 20 ug via INTRAVENOUS

## 2016-08-07 MED ORDER — ASPIRIN EC 325 MG PO TBEC
325.0000 mg | DELAYED_RELEASE_TABLET | ORAL | Status: AC
  Administered 2016-08-07: 325 mg via ORAL
  Filled 2016-08-07: qty 1

## 2016-08-07 MED ORDER — LIDOCAINE HCL 1 % IJ SOLN
INTRAMUSCULAR | Status: AC
  Filled 2016-08-07: qty 20

## 2016-08-07 MED ORDER — IOPAMIDOL (ISOVUE-300) INJECTION 61%
INTRAVENOUS | Status: AC
  Administered 2016-08-07: 85 mL
  Filled 2016-08-07: qty 150

## 2016-08-07 MED ORDER — NIMODIPINE 30 MG PO CAPS
0.0000 mg | ORAL_CAPSULE | ORAL | Status: AC
  Administered 2016-08-07: 30 mg via ORAL
  Filled 2016-08-07 (×2): qty 2

## 2016-08-07 MED ORDER — HEPARIN SODIUM (PORCINE) 1000 UNIT/ML IJ SOLN
INTRAMUSCULAR | Status: DC | PRN
  Administered 2016-08-07: 1000 [IU] via INTRAVENOUS

## 2016-08-07 NOTE — Anesthesia Procedure Notes (Signed)
Procedure Name: MAC Date/Time: 08/07/2016 9:35 AM Performed by: Jenne Campus Pre-anesthesia Checklist: Patient identified, Emergency Drugs available, Suction available, Patient being monitored and Timeout performed Oxygen Delivery Method: Nasal cannula

## 2016-08-07 NOTE — H&P (Signed)
Chief Complaint: Patient was seen in consultation today for cerebral arteriogram with possible right internal carotid artery aneurysm embolization at the request of Dr Lavera Guise  Referring Physician(s): Dr Lavera Guise  Supervising Physician: Luanne Bras  Patient Status: [facility] - Out-pt  History of Present Illness: [name] is a 83 y.o. 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 Atrophy, advanced small vessel disease, prominent perivascular spaces, and old cerebellar infarcts similar to priors. Hypertensive cerebrovascular disease is favored. No intracranial flow reducing stenosis. 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 Stable atheromatous change LEFT cavernous ICA .  Followed by Dr Erlinda Hong and referred for evaluation and managemant of same  Scheduled now for cerebral arteriogram with possible R ICA aneurysm embolization  Pt states all symptoms have resolved No vision or speech changes Noted "forgetfulness" -- no confusion   Past Medical History:  Diagnosis Date  . Aneurysm ([place])   . Arthritis    osteoarthritis, back, hands, wrists  . Bursitis    hips bilat   . CAD (coronary artery disease), autologous vein bypass graft March 2008   Follow-up cath: December 2015 Occluded SVG-D1 and occluded SVG-RI.  [name] CAD in native artery   . Cancer ([name])    + basal cell- on leg  . Dyslipidemia    Statin intolerant  . Glaucoma   . History of kidney stones   . Hypertension   . Non-STEMI (non-ST elevated myocardial infarction) [facility]) October 2005   EF by 35-40%, echo 40-50%. Angiography: 99% mid LAD involving D1 followed by 70% mid LAD; 80% RI. --> CABG  . PONV (postoperative nausea and vomiting)   . S/P CABG x 01 February 2004   LIMA-LAD, SVG-D1, SVG-RI.  [name] Statin intolerance    . Stroke [facility])     Past Surgical History:  Procedure Laterality Date  . BUNIONECTOMY     2002  . CARDIOVASCULAR STRESS TEST  05/23/2006   Mild lateral/inferolateral ischemia, likely due to occluded SVGs with exhisting disease.  [name] CAROTID DOPPLER  07/15/2009   Bilat ICAs 0-49% diameter reduction. Normal patency of Bilat subclavian arteries.  . CORONARY ARTERY BYPASS GRAFT  02/22/2004   x3. LIMA to distal LAD, SVG to first diag, SVG to ramus. SVG harvest from rt thigh.  . EP IMPLANTABLE DEVICE N/A 03/02/2016   Procedure: Loop Recorder Insertion;  Surgeon: Thompson Grayer, MD;  Location: [facility];  Service: Cardiovascular;  Laterality: N/A;  . EXCISION/RELEASE BURSA HIP Right 06/15/2015   Procedure: RIGHT HIP BURSECTOMY WITH GLUTEAL TENDON REPAIR;  Surgeon: Gaynelle Arabian, MD;  Location: [facility] ORS;  Service: Orthopedics;  Laterality: Right;  . EYE SURGERY     cataract surgery bilat   . IR GENERIC HISTORICAL  06/01/2016   IR RADIOLOGIST EVAL & MGMT 06/01/2016 [facility]-INTERV RAD  . LEFT HEART CATHETERIZATION WITH CORONARY ANGIOGRAM  02/19/2004   Significant 2 vessel CAD - LAD, D1 and RI  . LEFT HEART CATHETERIZATION WITH CORONARY/GRAFT ANGIOGRAM N/A 04/28/2014   Procedure: LEFT HEART CATHETERIZATION WITH Beatrix Fetters;  Surgeon: Sinclair Grooms, MD;  Location: [facility] CATH LAB: CTO of SVG-Diag & SVG-RI, patent LIMA-LAD. Patent native circumflex, LAD and RCA. 70% stenosis in a branch of RI. --> Does not explain "high risk perfusion study "  . LEFT HEART CATHETERIZATION WITH CORONARY/GRAFT  ANGIOGRAM   07/25/2006   Totally occluded SVG to diag and ramus. Patent LIMA-LAD. Ramus 70-80% proximal stenosis and occluded vein graft.  [name] left total knee replacement      2001  . NM MYOVIEW LTD  January 2008; December 2015   a. Referred for For mild inferolateral and anterolateral/apical lateral defect with mild reversibility.;; b. Large defect in the anterior and inferior wall. Suggestive of potential  infarct plus ischemia. HIGH RISK.  . right total knee replacement      2003  . TRANSTHORACIC ECHOCARDIOGRAM  02/19/2004; December 2015   a. EF 45-50%, Normal LV function, moderate hypokinesis of anterior wall.;; b. EF 50-55%. No RWMA, GR 1 DD. Normal valves     Allergies: Nsaids; Statins; Tricor [fenofibrate]; and Zetia [ezetimibe]  Medications: Prior to Admission medications   Medication Sig Start Date End Date Taking? Authorizing Provider  acetaminophen (TYLENOL) 500 MG tablet Take 500 mg by mouth every 6 (six) hours as needed (for pain).    Yes Historical Provider, MD  aspirin EC 81 MG tablet Take 81 mg by mouth at bedtime.    Yes Historical Provider, MD  atenolol (TENORMIN) 25 MG tablet TAKE 1 TABLET (25 MG TOTAL) BY MOUTH DAILY. Patient taking differently: TAKE 1 TABLET (25 MG TOTAL) BY MOUTH DAILY AT BEDTIME 05/08/16  Yes Thompson Grayer, MD  brimonidine (ALPHAGAN) 0.2 % ophthalmic solution Place 1 drop into both eyes 2 (two) times daily.   Yes Historical Provider, MD  brinzolamide (AZOPT) 1 % ophthalmic suspension Place 1 drop into both eyes 2 (two) times daily.   Yes Historical Provider, MD  Cholecalciferol (VITAMIN D3) 2000 UNITS capsule Take 2,000 Units by mouth at bedtime.    Yes Historical Provider, MD  clopidogrel (PLAVIX) 75 MG tablet TAKE 1 TABLET BY MOUTH EVERY DAY Patient taking differently: TAKE 1 TABLET BY MOUTH EVERY DAY AT BEDTIME 05/08/16  Yes Thompson Grayer, MD  latanoprost (XALATAN) 0.005 % ophthalmic solution Place 1 drop into both eyes at bedtime. 06/26/16  Yes Historical Provider, MD  lisinopril (PRINIVIL,ZESTRIL) 20 MG tablet Take 20 mg by mouth at bedtime.    Yes Historical Provider, MD  metroNIDAZOLE (METROCREAM) 0.75 % cream Apply 1 application topically 2 (two) times daily.  04/17/13  Yes Historical Provider, MD  timolol (BETIMOL) 0.5 % ophthalmic solution Place 1 drop into both eyes 2 (two) times daily.   Yes Historical Provider, MD     Family History  Problem  Relation Age of Onset  . Alzheimer's disease Mother   . Alzheimer's disease Sister   . Hyperlipidemia Sister   . Hypertension Sister   . Diabetes Sister   . Hyperlipidemia Sister   . Hypertension Sister   . Pancreatitis Child     Social History   Social History  . Marital status: Widowed    Spouse name: N/A  . Number of children: 2  . Years of education: 16+   Occupational History  . retired    Social History Main Topics  . Smoking status: Never Smoker  . Smokeless tobacco: Never Used  . Alcohol use 0.5 oz/week    1 Standard drinks or equivalent per week     Comment: 1 glass of liquor weekly   . Drug use: No  . Sexual activity: Not Asked   Other Topics Concern  . None   Social History Narrative   She is a widowed, mother of 2 grandmother of 3.    Lives with granddaughter.   Drinks coffee  every morning. 1 cup.   She used to do exercising through the [employer] program as well as the [employer]. Unfortunately, this finding of the classes for seniors changed, and she was unable to make the new classes. She did not like exercising with non-seniors. Besides that she does existing disease, walks up and down the stairs, walk her dog. She is not as active as he had been before.   She never smoked, never drank alcohol.          Review of Systems: A 12 point ROS discussed and pertinent positives are indicated in the HPI above.  All other systems are negative.  Review of Systems  Constitutional: Negative for activity change, appetite change, fatigue and fever.  HENT: Negative for tinnitus, trouble swallowing and voice change.   Eyes: Negative for visual disturbance.  Respiratory: Negative for cough and shortness of breath.   Cardiovascular: Negative for chest pain.  Gastrointestinal: Negative for abdominal pain.  Musculoskeletal: Negative for gait problem.  Neurological: Negative for dizziness, tremors, seizures, syncope, facial asymmetry, speech difficulty, weakness,  light-headedness, numbness and headaches.  Psychiatric/Behavioral: Negative for behavioral problems and confusion.    Vital Signs: BP (!) 194/78 Comment: rechecked manually; will notify Cathy, RN  Pulse 71   Temp 97.4 F (36.3 C) (Oral)   Resp 18   Ht 5\' 6"  (1.676 m)   Wt 127 lb (57.6 kg)   SpO2 100%   BMI 20.50 kg/m   Physical Exam  Constitutional: She is oriented to person, place, and time. She appears well-nourished.  HENT:  Head: Atraumatic.  Eyes: EOM are normal.  Neck: Neck supple.  Cardiovascular: Normal rate, regular rhythm and normal heart sounds.   Pulmonary/Chest: Effort normal and breath sounds normal.  Abdominal: Soft. Bowel sounds are normal.  Musculoskeletal: Normal range of motion.  Neurological: She is alert and oriented to person, place, and time.  Skin: Skin is warm and dry.  Psychiatric: She has a normal mood and affect. Her behavior is normal. Judgment and thought content normal.  Pt does have a minimal noted forgetfulness Needed a reminder as to how this procedure was to be performed Answers all questions appropriately Follows all commands  Nursing note and vitals reviewed.   Mallampati Score:  MD Evaluation Airway: WNL Heart: WNL Abdomen: WNL Chest/ Lungs: WNL ASA  Classification: 3 Mallampati/Airway Score: One  Imaging: No results found.  Labs:  CBC:  Recent Labs  04/05/16 1129 04/05/16 1135 04/06/16 0322 07/06/16 1018 08/07/16 0707  WBC 5.4  --  4.8 4.8 5.0  HGB 14.0 13.9 12.4 14.0 13.3  HCT 41.0 41.0 37.5 41.8 39.8  PLT 264  --  229 262 220    COAGS:  Recent Labs  03/02/16 1150 04/05/16 1129 07/06/16 1018 08/07/16 0707  INR 1.02 0.95 1.00 0.99  APTT 26 27 27 27     BMP:  Recent Labs  04/05/16 1129 04/05/16 1135 04/06/16 0322 07/06/16 1018 08/07/16 0707  NA 142 144 142 141 143  K 3.5 3.4* 3.3* 4.1 3.2*  CL 107 106 108 108 106  CO2 25  --  26 25 25   GLUCOSE 109* 103* 85 105* 102*  BUN 11 14 10 14 12     CALCIUM 9.9  --  9.5 9.7 9.7  CREATININE 0.84 0.80 0.75 0.81 0.78  GFRNONAA >60  --  >60 >60 >60  GFRAA >60  --  >60 >60 >60    LIVER FUNCTION TESTS:  Recent Labs  03/02/16 1150  04/05/16 1129 07/06/16 1018 08/07/16 0707  BILITOT 0.9 0.6 0.7 0.6  AST 21 19 16 17   ALT 13* 13* 11* 10*  ALKPHOS 66 68 75 81  PROT 6.5 6.5 6.9 6.2*  ALBUMIN 3.9 4.2 4.2 3.8    TUMOR MARKERS: No results for input(s): AFPTM, CEA, CA199, CHROMGRNA in the last 8760 hours.  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 All of the patient's questions were answered, patient is agreeable to proceed. Consent signed and in chart.  Pt and granddaughter are aware pt will be admitted into Neuro ICU if procedure is performed today. Plan for discharge to home tomorrow  Thank you for this interesting consult.  I greatly enjoyed meeting [name] and look forward to participating in their care.  A copy of this report was sent to the requesting provider on this date.  Electronically Signed: Ronalee Scheunemann A 08/07/2016, 8:17 AM   I spent a total of  30 Minutes   in face to face in clinical consultation, greater than 50% of which was counseling/coordinating care for R ICA aneurysm embolization

## 2016-08-07 NOTE — Transfer of Care (Signed)
Immediate Anesthesia Transfer of Care Note  Patient: [name]  Procedure(s) Performed: Procedure(s): EMBOLIZATION (N/A)  Patient Location: PACU  Anesthesia Type:MAC  Level of Consciousness: awake, alert , oriented and patient cooperative  Airway & Oxygen Therapy: Patient Spontanous Breathing  Post-op Assessment: Report given to RN and Post -op Vital signs reviewed and stable  Post vital signs: Reviewed  Last Vitals:  Vitals:   08/07/16 0702 08/07/16 0830  BP: (!) 194/78 (!) 214/63  Pulse:    Resp:    Temp:      Last Pain:  Vitals:   08/07/16 0648  TempSrc: Oral      Patients Stated Pain Goal: 6 (29/52/84 1324)  Complications: No apparent anesthesia complications

## 2016-08-07 NOTE — Sedation Documentation (Signed)
Gauze/tegaderm with V Pad bandage applied to R fem artery puncture site. R Groin level 0, 4+RDP.

## 2016-08-07 NOTE — Discharge Instructions (Signed)
Remove dressing in 24 hours from placement ( when you wake up or before 1045 Tuesday morning).  Resume all home medications tonight  Rest in bed tonight and resume normal activity tomorrow  Remove dressing and may shower tomorrow  Watch groin site tonight for swelling bruising bleeding  No stooping, or bending

## 2016-08-07 NOTE — Procedures (Signed)
S/P 4 vessel cerebral arteriogram. RT CFA approach. Findings. 1.approx 6.6 mm RT ICA caval cavernous fusiform dilatation associated with a bilobed outpouching measuring 3.82mm ,and 2.72mm x 2.4 mm ,prox to the ophthalmic artery.

## 2016-08-07 NOTE — Sedation Documentation (Signed)
Anesthesia case 

## 2016-08-07 NOTE — Sedation Documentation (Signed)
R Groin level 0, 4+ RDP.

## 2016-08-07 NOTE — Sedation Documentation (Signed)
5 Fr sheath removed from R femoral artery by Emeline General, RTR. Hemostasis achieved using manual pressure. Groin level 0, 4+RDP.

## 2016-08-07 NOTE — Anesthesia Postprocedure Evaluation (Signed)
Anesthesia Post Note  Patient: [name]  Procedure(s) Performed: Procedure(s) (LRB): EMBOLIZATION (N/A)  Patient location during evaluation: PACU Anesthesia Type: MAC Level of consciousness: awake Pain management: pain level controlled Vital Signs Assessment: post-procedure vital signs reviewed and stable Respiratory status: spontaneous breathing Cardiovascular status: stable Anesthetic complications: no       Last Vitals:  Vitals:   08/07/16 1116 08/07/16 1117  BP:  (!) 141/64  Pulse: 64 62  Resp: 19 15  Temp:  36.2 C    Last Pain:  Vitals:   08/07/16 1117  TempSrc:   PainSc: 0-No pain                 Jamieson Lisa

## 2016-08-07 NOTE — Anesthesia Preprocedure Evaluation (Addendum)
Anesthesia Evaluation  Patient identified by MRN, date of birth, ID band Patient awake    Reviewed: Allergy & Precautions, NPO status , Patient's Chart, lab work & pertinent test results, reviewed documented beta blocker date and time   History of Anesthesia Complications (+) PONV and history of anesthetic complications  Airway Mallampati: II  TM Distance: >3 FB Neck ROM: Full    Dental  (+) Teeth Intact, Dental Advisory Given, Caps   Pulmonary neg pulmonary ROS,    breath sounds clear to auscultation       Cardiovascular hypertension, Pt. on medications and Pt. on home beta blockers + CAD, + Past MI, + CABG and + Peripheral Vascular Disease   Rhythm:Regular Rate:Normal     Neuro/Psych  Headaches, TIACVA, No Residual Symptoms    GI/Hepatic negative GI ROS, Neg liver ROS,   Endo/Other  negative endocrine ROS  Renal/GU negative Renal ROS     Musculoskeletal  (+) Arthritis ,   Abdominal   Peds  Hematology   Anesthesia Other Findings   Reproductive/Obstetrics                           Anesthesia Physical Anesthesia Plan  ASA: III  Anesthesia Plan: MAC   Post-op Pain Management:    Induction: Intravenous  Airway Management Planned: Simple Face Mask  Additional Equipment:   Intra-op Plan:   Post-operative Plan:   Informed Consent: I have reviewed the patients History and Physical, chart, labs and discussed the procedure including the risks, benefits and alternatives for the proposed anesthesia with the patient or authorized representative who has indicated his/her understanding and acceptance.   Dental advisory given  Plan Discussed with: Anesthesiologist and CRNA  Anesthesia Plan Comments:         Anesthesia Quick Evaluation

## 2016-08-07 NOTE — Progress Notes (Signed)
Dr. Dora Sims stated to give nimodipine 30 mg po . 198/73 blood pressure.

## 2016-08-07 NOTE — Discharge Instructions (Signed)

## 2016-08-08 ENCOUNTER — Encounter (HOSPITAL_COMMUNITY): Payer: Self-pay | Admitting: Interventional Radiology

## 2016-08-09 ENCOUNTER — Ambulatory Visit (HOSPITAL_COMMUNITY): Admission: RE | Admit: 2016-08-09 | Payer: Medicare Other | Source: Ambulatory Visit

## 2016-08-09 ENCOUNTER — Ambulatory Visit: Payer: Medicare Other | Admitting: Podiatry

## 2016-08-29 ENCOUNTER — Ambulatory Visit (INDEPENDENT_AMBULATORY_CARE_PROVIDER_SITE_OTHER): Payer: Medicare Other | Admitting: *Deleted

## 2016-08-29 DIAGNOSIS — I639 Cerebral infarction, unspecified: Secondary | ICD-10-CM

## 2016-08-30 NOTE — Progress Notes (Signed)
[facility] Summary Report / Loop Recorder 

## 2016-09-06 ENCOUNTER — Encounter: Payer: Self-pay | Admitting: Neurology

## 2016-09-06 ENCOUNTER — Ambulatory Visit (INDEPENDENT_AMBULATORY_CARE_PROVIDER_SITE_OTHER): Payer: Medicare Other | Admitting: Neurology

## 2016-09-06 VITALS — BP 164/84 | HR 67 | Ht 66.0 in | Wt 129.4 lb

## 2016-09-06 DIAGNOSIS — I671 Cerebral aneurysm, nonruptured: Secondary | ICD-10-CM

## 2016-09-06 DIAGNOSIS — I639 Cerebral infarction, unspecified: Secondary | ICD-10-CM

## 2016-09-06 DIAGNOSIS — F0390 Unspecified dementia without behavioral disturbance: Secondary | ICD-10-CM

## 2016-09-06 DIAGNOSIS — G458 Other transient cerebral ischemic attacks and related syndromes: Secondary | ICD-10-CM

## 2016-09-06 NOTE — Progress Notes (Signed)
[facility]    Provider:  [name] Referring Provider: Carol Ada, MD Primary Care Physician:  Carol Ada, MD  CC: TIA  Interval history 09/06/2016: She was seen by [name] and at this point he is watching the aneurysms. She is going to followup with him in 6 months. She lives with her granddaughter [name]. Patient denies any significant memory issues. Declines eval or treatment. Patient doesn't want follow up/ I recommended a year follow up at least, would prefer every 6 months. She follows with [name] and he is managing her glaucoma and other disorders.   Interval history 05/09/2016: Her blood pressure varies from 062 systolic up to 376 per patient and today it is 283 systolic. But often times it is low at home per patient. I have asked her document blood pressures daily and take them to her pcp for management of her BP medications . I discussed her memory today. Patient was having hallucinations, men and women in [place] garb, cats and dogs and little kids. They had decorated the credenza and sometimes they brush against her and she can feel it. She knows they are not real. It bothers her a little bit. 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 She is having significant vision changes, will refer to ophthalmology [name] for evaluation.  Sometimes the hallucinations would be on the wall other times int he middle of the room. She lives with her granddaughter. She pays her own bills but having more difficulty with paying bills, she is having a lot of vision changes which is impacting her. She can't read anymore. She is having difficulty with comprehension. She opens her purse and can't find the zipper because of sight. No more episodes of confusion per patient. Discussed an ambulatory 72-hour eeg but she declines. She denies any memory changes.   Interval history: Her blood pressure is extremely elevated, she has  an appt this month, not symptomatic. She had an episode of confusion, couldn;t dial anybody, couldn;t remember anyone's names, She was in the house and no one else was there and she felt like she was having another episode. She remembers the whole event, she called 911 and the ambulance came. Repeat MRi did not show any new strokes. She has not had any episodes since then. She was able to talk to EMS. She has been dizzy after taking propranolol, advised to discuss with primary including elevated blood prussure. Will order CTA of the head to further examine aneurysms, warned she needs to manage her BP due to risk of rupture, follow up with pcp int he next few weeks. Friend with her, they both acknowledge understanding.  TDV:VOHYWVP A Laytonis a 83 y.o.femalehere as a referral from Dr. Denzil Magnuson TIA. She has a past medical history of vitamin D deficiency, high cholesterol, hypertension, atherosclerotic heart disease, osteoarthritis, glaucoma, history of coronary artery bypass, TIA. Patient was out shopping and she suddenly couldn't talk, words wouldn;t come. No inciting event or previous illnesses or head trauma. MRi of the brain was negative for acute event. Friend is here and provides information. Friend says patient was confused for a short period of time afterwards, Patient couldn't find her keys, she was confused, had a difficult time finding money right in her purse, she couldn;t make complete sentences. Patient says she remembers it all, no LOC or altered awareness. She was thinking but she couldn't make words come out. Symptoms resolved completely. However she is  also having hallucination recently, she is also seeing people, it just started a day and a half ago, she is seeing people one had a cat with them and she sawsomeone in the yard. They were in her bedroom all night. Just started since yesterday, in the past week patient is more confused, she was seen Monday with Dr. Tamala Julian and they say a work  up for metabolic/infectios causes ruled out however they do not think urine was taken. Patient is more confused in the last few weeks. She is having difficulty saying things, not completingsentences. More confusion. Also progressive memory changes the last year. Sister and mother with Alzheimer's dz  Cholesterol LDL 151. CMP 08/16/2015 with BUN 24 and creatinine 0.91.hgba1c 5.4.  Reviewed notes, labs and imaging from outside physicians, which showed:  Personally reviewed MRI images of the brain and garee with the following: IMPRESSION: 1. No acute intracranial abnormality. 2. Moderate chronic small vessel ischemic disease. 3. Chronic cerebellar infarcts. 4. No major intracranial branch vessel occlusion or significant proximal stenosis. 5. 5 mm right ICA paraophthalmic aneurysm. 6. Possible 3 mm left ICA aneurysm near the petrous-cavernous Junction.  EEG normal. ECHO did not show any clots in the heart.   Review of Systems: Patient complains of symptoms per HPI as well as the following symptoms:weight loss, fatigue, blurred vision, easy bruising him a hearing loss, runny nose, memory loss, confusion, headache, numbness, weakness, dizziness, tremor, decreased energy, change in appetite, disinterest in activities ertinent negatives per HPI. All others negative.  Social History   Social History  . Marital status: Widowed    Spouse name: N/A  . Number of children: 2  . Years of education: 16+   Occupational History  . retired    Social History Main Topics  . Smoking status: Never Smoker  . Smokeless tobacco: Never Used  . Alcohol use 0.5 oz/week    1 Standard drinks or equivalent per week     Comment: 1 glass of liquor weekly   . Drug use: No  . Sexual activity: Not on file   Other Topics Concern  . Not on file   Social History Narrative   She is a widowed, mother of 2 grandmother of 3.    Lives with granddaughter.   Drinks coffee every morning. 1 cup.   She used to do  exercising through the [employer] program as well as the [employer]. Unfortunately, this finding of the classes for seniors changed, and she was unable to make the new classes. She did not like exercising with non-seniors. Besides that she does existing disease, walks up and down the stairs, walk her dog. She is not as active as he had been before.   She never smoked, never drank alcohol.          Family History  Problem Relation Age of Onset  . Alzheimer's disease Mother   . Alzheimer's disease Sister   . Hyperlipidemia Sister   . Hypertension Sister   . Diabetes Sister   . Hyperlipidemia Sister   . Hypertension Sister   . Pancreatitis Child     Past Medical History:  Diagnosis Date  . Aneurysm ([place])   . Arthritis    osteoarthritis, back, hands, wrists  . Bursitis    hips bilat   . CAD (coronary artery disease), autologous vein bypass graft March 2008   Follow-up cath: December 2015 Occluded SVG-D1 and occluded SVG-RI.  [name] CAD in native artery   . Cancer ([place])    +  basal cell- on leg  . Dyslipidemia    Statin intolerant  . Glaucoma   . History of kidney stones   . Hypertension   . Non-STEMI (non-ST elevated myocardial infarction) [facility]) October 2005   EF by 35-40%, echo 40-50%. Angiography: 99% mid LAD involving D1 followed by 70% mid LAD; 80% RI. --> CABG  . PONV (postoperative nausea and vomiting)   . S/P CABG x 01 February 2004   LIMA-LAD, SVG-D1, SVG-RI.  [name] Statin intolerance   . Stroke [facility])     Past Surgical History:  Procedure Laterality Date  . BUNIONECTOMY     2002  . CARDIOVASCULAR STRESS TEST  05/23/2006   Mild lateral/inferolateral ischemia, likely due to occluded SVGs with exhisting disease.  [name] CAROTID DOPPLER  07/15/2009   Bilat ICAs 0-49% diameter reduction. Normal patency of Bilat subclavian arteries.  . CORONARY ARTERY BYPASS GRAFT  02/22/2004   x3. LIMA to distal LAD, SVG to first diag, SVG to ramus. SVG harvest from rt thigh.  . EP IMPLANTABLE  DEVICE N/A 03/02/2016   Procedure: Loop Recorder Insertion;  Surgeon: Thompson Grayer, MD;  Location: [facility];  Service: Cardiovascular;  Laterality: N/A;  . EXCISION/RELEASE BURSA HIP Right 06/15/2015   Procedure: RIGHT HIP BURSECTOMY WITH GLUTEAL TENDON REPAIR;  Surgeon: Gaynelle Arabian, MD;  Location: [facility];  Service: Orthopedics;  Laterality: Right;  . EYE SURGERY     cataract surgery bilat   . IR ANGIO INTRA EXTRACRAN SEL COM CAROTID INNOMINATE BILAT MOD SED  08/07/2016  . IR ANGIO VERTEBRAL SEL SUBCLAVIAN INNOMINATE BILAT MOD SED  08/07/2016  . IR GENERIC HISTORICAL  06/01/2016   IR RADIOLOGIST EVAL & MGMT 06/01/2016 [facility]INTER[facility] RAD  . LEFT HEART CATHETERIZATION WITH CORONARY ANGIOGRAM  02/19/2004   Significant 2 vessel CAD - LAD, D1 and RI  . LEFT HEART CATHETERIZATION WITH CORONARY/GRAFT ANGIOGRAM N/A 04/28/2014   Procedure: LEFT HEART CATHETERIZATION WITH Beatrix Fetters;  Surgeon: Sinclair Grooms, MD;  Location: [facility] CATH LAB: CTO of SVG-Diag & SVG-RI, patent LIMA-LAD. Patent native circumflex, LAD and RCA. 70% stenosis in a branch of RI. --> Does not explain "high risk perfusion study "  . LEFT HEART CATHETERIZATION WITH CORONARY/GRAFT ANGIOGRAM   07/25/2006   Totally occluded SVG to diag and ramus. Patent LIMA-LAD. Ramus 70-80% proximal stenosis and occluded vein graft.  [name] left total knee replacement      2001  . NM MYOVIEW LTD  January 2008; December 2015   a. Referred for For mild inferolateral and anterolateral/apical lateral defect with mild reversibility.;; b. Large defect in the anterior and inferior wall. Suggestive of potential infarct plus ischemia. HIGH RISK.  [name] RADIOLOGY WITH ANESTHESIA N/A 08/07/2016   Procedure: EMBOLIZATION;  Surgeon: Luanne Bras, MD;  Location: [place];  Service: Radiology;  Laterality: N/A;  . right total knee replacement      2003  . TRANSTHORACIC ECHOCARDIOGRAM  02/19/2004; December 2015   a. EF 45-50%, Normal LV function, moderate  hypokinesis of anterior wall.;; b. EF 50-55%. No RWMA, GR 1 DD. Normal valves     Current Outpatient Prescriptions  Medication Sig Dispense Refill  . acetaminophen (TYLENOL) 500 MG tablet Take 500 mg by mouth every 6 (six) hours as needed (for pain).     [name] aspirin EC 81 MG tablet Take 81 mg by mouth at bedtime.     [name] atenolol (TENORMIN) 25 MG tablet TAKE 1 TABLET (25 MG TOTAL) BY MOUTH DAILY. (Patient taking differently:  TAKE 1 TABLET (25 MG TOTAL) BY MOUTH DAILY AT BEDTIME) 30 tablet 11  . brimonidine (ALPHAGAN) 0.2 % ophthalmic solution Place 1 drop into both eyes 2 (two) times daily.    . brinzolamide (AZOPT) 1 % ophthalmic suspension Place 1 drop into both eyes 2 (two) times daily.    . Cholecalciferol (VITAMIN D3) 2000 UNITS capsule Take 2,000 Units by mouth at bedtime.     . clopidogrel (PLAVIX) 75 MG tablet TAKE 1 TABLET BY MOUTH EVERY DAY (Patient taking differently: TAKE 1 TABLET BY MOUTH EVERY DAY AT BEDTIME) 30 tablet 10  . latanoprost (XALATAN) 0.005 % ophthalmic solution Place 1 drop into both eyes at bedtime.  3  . lisinopril (PRINIVIL,ZESTRIL) 20 MG tablet Take 20 mg by mouth at bedtime.     . metroNIDAZOLE (METROCREAM) 0.75 % cream Apply 1 application topically 2 (two) times daily.     . timolol (BETIMOL) 0.5 % ophthalmic solution Place 1 drop into both eyes 2 (two) times daily.     No current facility-administered medications for this visit.     Allergies as of 09/06/2016 - Review Complete 09/06/2016  Allergen Reaction Noted  . Nsaids Other (See Comments) 05/08/2013  . Statins Other (See Comments) 05/08/2013  . Tricor [fenofibrate] Other (See Comments) 05/08/2013  . Zetia [ezetimibe] Other (See Comments) 05/08/2013    Vitals: BP (!) 164/84   Pulse 67   Ht 5\' 6"  (1.676 m)   Wt 129 lb 6.4 oz (58.7 kg)   BMI 20.89 kg/m  Last Weight:  Wt Readings from Last 1 Encounters:  09/06/16 129 lb 6.4 oz (58.7 kg)   Last Height:   Ht Readings from Last 1 Encounters:    09/06/16 5\' 6"  (1.676 m)     Cranial Nerves: The pupils are equal, round, and reactive to light. Attempted fundoscopic exam could not visualiz. Visual fields are full to finger confrontation. Extraocular movements are intact. Trigeminal sensation is intact and the muscles of mastication are normal. The face is symmetric. The palate elevates in the midline. Hearing intact. Voice is normal. Shoulder shrug is normal. The tongue has normal motion without fasciculations.   Coordination: Normal finger to nose and heel to shin.  Gait: Antalgic due to right leg pain (says she had a surgery on the right hip tendon and since then difficulty walking)  Motor Observation: mild postural and head tremor. No resting tremor.  Tone: Mild cogwheeling right >left UE. [name]   Posture: Posture is normal. normal erect  Strength: Mild prox leg weakness bilat otherwise strength is V/V in the upper and lower limbs.   Sensation: intact to LT  Reflex Exam:  DTR's: Deep tendon reflexes in the upper and lower extremities are normal bilaterally.  Toes: The toes are downgoing bilaterally.  Clonus: Clonus is absent.     Assessment/Plan:83 y.o. femalehere as a referral from Dr. Denzil Magnuson TIA vs seizures. She has a past medical history of vitamin D deficiency, high cholesterol, hypertension, atherosclerotic heart disease, osteoarthritis, glaucoma, coronary artery bypass. Here for evaluation of acute episodes of confusion which may be partial seizures vs TIA. Also more confused in the last few months with hallucinations. She is having significant vision changes as well, her pcp has suggested she may have Sherran Needs syndrome to explain the hallucinations which is a possibility but I am concerned she may have the beginning of a Lewy body dementia which can present with hallucinations, her MMSE was 23/30 and she appears to be having cognitive difficulties  which she denies.     - Patient with multiple bilateral cerebellar infarcts already on plavix and aspirin. Cryptogenic may be embolic. Had loop recorder placed - 23/30 on MMSE with hallucinations, concerning for Lewy Body Dementia vs charles bonnet syndrome (referred to to ophtho). Will have to follow clinically. - 63mm and 44mm aneurysms, discussed with patient and friend and risk of bleeding especially with her elevated BP. Was referred to [name]. - Discussed dementia, especially Lew body dementia given the hallucinations. Patient denies any significant cognitive changes or treatment. - Had a routine EEG for recent confusion which was slowed, no epileptiform activity. Recommended an ambulatoryt 3-day eeg. She declines getting a prolonged eeg now, I have encouraged her but she declines. - Significant vision changes, Ophthalmology follows  - if she has repeat strokes and loop does not record afib, then due to cryptogenic embolic strokes there is an option to start a blood thinner such as coumadin (could not use DOAcs not indicated for this). Discussed with her and she declines due to increased risks.  -  I had a long d/w patient about her strokes/TIA, risk for recurrent stroke/TIAs, personally independently reviewed imaging studies and stroke evaluation results and answered questions.Continue Plavix and asa for secondary stroke prevention and maintain strict control of hypertension with blood pressure goal below 130/90, diabetes with hemoglobin A1c goal below 6.5% and lipids with LDL cholesterol goal below 70 mg/dL. I also advised the patient to eat a healthy diet with plenty of whole grains, cereals, fruits and vegetables, exercise regularly and maintain ideal body weight .  Followup in the future with me in 6-9 months or call earlier if necessary.   Sarina Ill, MD  [facility] [address] [place], [place] [address]  Phone [phone] Fax [phone]  A total  of 15 minutes was spent face-to-face with this patient. Over half this time was spent on counseling patient on the TIA and dementia,aneurysms diagnosis and different diagnostic and therapeutic options available.

## 2016-09-10 LAB — CUP PACEART REMOTE DEVICE CHECK
Date Time Interrogation Session: 20180501223622
MDC IDC PG IMPLANT DT: 20171102

## 2016-09-10 NOTE — Progress Notes (Signed)
Carelink summary report received. Battery status OK. Normal device function. No new symptom episodes, tachy episodes, brady, or pause episodes. No new AF episodes. Monthly summary reports and ROV/PRN 

## 2016-09-28 ENCOUNTER — Ambulatory Visit (INDEPENDENT_AMBULATORY_CARE_PROVIDER_SITE_OTHER): Payer: Medicare Other | Admitting: *Deleted

## 2016-09-28 DIAGNOSIS — I639 Cerebral infarction, unspecified: Secondary | ICD-10-CM

## 2016-09-28 NOTE — Progress Notes (Signed)
Carelink Summary Report 

## 2016-09-30 LAB — CUP PACEART REMOTE DEVICE CHECK
Implantable Pulse Generator Implant Date: 20171102
MDC IDC SESS DTM: 20180531223642

## 2016-09-30 NOTE — Progress Notes (Signed)
Carelink summary report received. Battery status OK. Normal device function. No new symptom episodes, tachy episodes, brady, or pause episodes. No new AF episodes. Monthly summary reports and ROV/PRN 

## 2016-10-09 ENCOUNTER — Emergency Department (HOSPITAL_COMMUNITY): Payer: Medicare Other

## 2016-10-09 ENCOUNTER — Encounter (HOSPITAL_COMMUNITY): Payer: Self-pay | Admitting: *Deleted

## 2016-10-09 DIAGNOSIS — I252 Old myocardial infarction: Secondary | ICD-10-CM | POA: Diagnosis not present

## 2016-10-09 DIAGNOSIS — I10 Essential (primary) hypertension: Secondary | ICD-10-CM | POA: Diagnosis not present

## 2016-10-09 DIAGNOSIS — R0789 Other chest pain: Principal | ICD-10-CM | POA: Insufficient documentation

## 2016-10-09 DIAGNOSIS — Z8673 Personal history of transient ischemic attack (TIA), and cerebral infarction without residual deficits: Secondary | ICD-10-CM | POA: Diagnosis not present

## 2016-10-09 DIAGNOSIS — I160 Hypertensive urgency: Secondary | ICD-10-CM | POA: Insufficient documentation

## 2016-10-09 DIAGNOSIS — E876 Hypokalemia: Secondary | ICD-10-CM | POA: Insufficient documentation

## 2016-10-09 DIAGNOSIS — R079 Chest pain, unspecified: Secondary | ICD-10-CM | POA: Diagnosis not present

## 2016-10-09 DIAGNOSIS — I2510 Atherosclerotic heart disease of native coronary artery without angina pectoris: Secondary | ICD-10-CM | POA: Diagnosis not present

## 2016-10-09 DIAGNOSIS — Z7982 Long term (current) use of aspirin: Secondary | ICD-10-CM | POA: Insufficient documentation

## 2016-10-09 DIAGNOSIS — Z7902 Long term (current) use of antithrombotics/antiplatelets: Secondary | ICD-10-CM | POA: Diagnosis not present

## 2016-10-09 DIAGNOSIS — Z96653 Presence of artificial knee joint, bilateral: Secondary | ICD-10-CM | POA: Diagnosis not present

## 2016-10-09 DIAGNOSIS — Z951 Presence of aortocoronary bypass graft: Secondary | ICD-10-CM | POA: Insufficient documentation

## 2016-10-09 LAB — CBC
HCT: 39.6 % (ref 36.0–46.0)
Hemoglobin: 13.2 g/dL (ref 12.0–15.0)
MCH: 30.6 pg (ref 26.0–34.0)
MCHC: 33.3 g/dL (ref 30.0–36.0)
MCV: 91.9 fL (ref 78.0–100.0)
PLATELETS: 241 10*3/uL (ref 150–400)
RBC: 4.31 MIL/uL (ref 3.87–5.11)
RDW: 12.9 % (ref 11.5–15.5)
WBC: 5.1 10*3/uL (ref 4.0–10.5)

## 2016-10-09 LAB — BASIC METABOLIC PANEL
Anion gap: 11 (ref 5–15)
BUN: 16 mg/dL (ref 6–20)
CO2: 22 mmol/L (ref 22–32)
Calcium: 9.6 mg/dL (ref 8.9–10.3)
Chloride: 109 mmol/L (ref 101–111)
Creatinine, Ser: 0.87 mg/dL (ref 0.44–1.00)
GFR, EST NON AFRICAN AMERICAN: 60 mL/min — AB (ref >60)
Glucose, Bld: 105 mg/dL — ABNORMAL HIGH (ref 65–99)
POTASSIUM: 3.4 mmol/L — AB (ref 3.5–5.1)
SODIUM: 142 mmol/L (ref 135–145)

## 2016-10-09 LAB — I-STAT TROPONIN, ED: Troponin i, poc: 0 ng/mL (ref 0.00–0.08)

## 2016-10-09 NOTE — ED Triage Notes (Signed)
Pt is here with central CP that is radiating to right shoulder and right ear.  Pt states that "it hurts and feels like my chest did when I had my heart attack and needed the bypass".  No sob with this.

## 2016-10-10 ENCOUNTER — Observation Stay (HOSPITAL_COMMUNITY)
Admission: EM | Admit: 2016-10-10 | Discharge: 2016-10-10 | Disposition: A | Discharge status: Home / Self Care | Payer: Medicare Other | Attending: Internal Medicine | Admitting: Internal Medicine

## 2016-10-10 DIAGNOSIS — E785 Hyperlipidemia, unspecified: Secondary | ICD-10-CM | POA: Diagnosis present

## 2016-10-10 DIAGNOSIS — R079 Chest pain, unspecified: Secondary | ICD-10-CM | POA: Diagnosis present

## 2016-10-10 DIAGNOSIS — I2510 Atherosclerotic heart disease of native coronary artery without angina pectoris: Secondary | ICD-10-CM | POA: Diagnosis not present

## 2016-10-10 DIAGNOSIS — Z7982 Long term (current) use of aspirin: Secondary | ICD-10-CM | POA: Diagnosis not present

## 2016-10-10 DIAGNOSIS — Z7902 Long term (current) use of antithrombotics/antiplatelets: Secondary | ICD-10-CM | POA: Diagnosis not present

## 2016-10-10 DIAGNOSIS — Z951 Presence of aortocoronary bypass graft: Secondary | ICD-10-CM

## 2016-10-10 DIAGNOSIS — R0789 Other chest pain: Secondary | ICD-10-CM | POA: Diagnosis not present

## 2016-10-10 DIAGNOSIS — E876 Hypokalemia: Secondary | ICD-10-CM | POA: Diagnosis not present

## 2016-10-10 DIAGNOSIS — I252 Old myocardial infarction: Secondary | ICD-10-CM | POA: Diagnosis not present

## 2016-10-10 DIAGNOSIS — I639 Cerebral infarction, unspecified: Secondary | ICD-10-CM

## 2016-10-10 DIAGNOSIS — I25719 Atherosclerosis of autologous vein coronary artery bypass graft(s) with unspecified angina pectoris: Secondary | ICD-10-CM

## 2016-10-10 DIAGNOSIS — Z8673 Personal history of transient ischemic attack (TIA), and cerebral infarction without residual deficits: Secondary | ICD-10-CM | POA: Diagnosis not present

## 2016-10-10 DIAGNOSIS — I25810 Atherosclerosis of coronary artery bypass graft(s) without angina pectoris: Secondary | ICD-10-CM | POA: Diagnosis present

## 2016-10-10 DIAGNOSIS — I160 Hypertensive urgency: Secondary | ICD-10-CM | POA: Diagnosis present

## 2016-10-10 DIAGNOSIS — I10 Essential (primary) hypertension: Secondary | ICD-10-CM

## 2016-10-10 DIAGNOSIS — Z789 Other specified health status: Secondary | ICD-10-CM | POA: Diagnosis present

## 2016-10-10 DIAGNOSIS — Z96653 Presence of artificial knee joint, bilateral: Secondary | ICD-10-CM | POA: Diagnosis not present

## 2016-10-10 LAB — TROPONIN I: Troponin I: <0.03 ng/mL (ref <0.03)

## 2016-10-10 LAB — LIPID PANEL
Cholesterol: 237 mg/dL — ABNORMAL HIGH (ref 0–200)
HDL: 62 mg/dL (ref >40)
LDL CALC: 158 mg/dL — AB (ref 0–99)
TRIGLYCERIDES: 85 mg/dL (ref <150)
Total CHOL/HDL Ratio: 3.8 RATIO
VLDL: 17 mg/dL (ref 0–40)

## 2016-10-10 LAB — BRAIN NATRIURETIC PEPTIDE: B Natriuretic Peptide: 309.7 pg/mL — ABNORMAL HIGH (ref 0.0–100.0)

## 2016-10-10 MED ORDER — BRIMONIDINE TARTRATE 0.15 % OP SOLN
1.0000 [drp] | Freq: Two times a day (BID) | OPHTHALMIC | Status: DC
  Administered 2016-10-10: 1 [drp] via OPHTHALMIC
  Filled 2016-10-10: qty 5

## 2016-10-10 MED ORDER — CLOPIDOGREL BISULFATE 75 MG PO TABS
75.0000 mg | ORAL_TABLET | Freq: Every day | ORAL | Status: DC
  Administered 2016-10-10: 75 mg via ORAL
  Filled 2016-10-10: qty 1

## 2016-10-10 MED ORDER — CARVEDILOL 12.5 MG PO TABS
12.5000 mg | ORAL_TABLET | Freq: Two times a day (BID) | ORAL | Status: DC
  Administered 2016-10-10: 12.5 mg via ORAL
  Filled 2016-10-10: qty 1

## 2016-10-10 MED ORDER — TIMOLOL MALEATE 0.5 % OP SOLN
1.0000 [drp] | Freq: Two times a day (BID) | OPHTHALMIC | Status: DC
  Administered 2016-10-10: 1 [drp] via OPHTHALMIC
  Filled 2016-10-10: qty 5

## 2016-10-10 MED ORDER — ATENOLOL 25 MG PO TABS: 50.0000 mg | ORAL_TABLET | Freq: Every day | ORAL | Status: DC

## 2016-10-10 MED ORDER — METRONIDAZOLE 0.75 % EX GEL
Freq: Every day | CUTANEOUS | Status: DC
  Filled 2016-10-10: qty 45

## 2016-10-10 MED ORDER — LISINOPRIL 20 MG PO TABS: 20.0000 mg | ORAL_TABLET | Freq: Every day | ORAL | Status: DC

## 2016-10-10 MED ORDER — ACETAMINOPHEN 500 MG PO TABS: 500.0000 mg | ORAL_TABLET | Freq: Four times a day (QID) | ORAL | Status: DC | PRN

## 2016-10-10 MED ORDER — ENOXAPARIN SODIUM 40 MG/0.4ML ~~LOC~~ SOLN
40.0000 mg | Freq: Every day | SUBCUTANEOUS | Status: DC
Start: 2016-10-10 — End: 2016-10-10
  Administered 2016-10-10: 40 mg via SUBCUTANEOUS
  Filled 2016-10-10: qty 0.4

## 2016-10-10 MED ORDER — MORPHINE SULFATE (PF) 4 MG/ML IV SOLN: 1.0000 mg | INTRAVENOUS | Status: DC | PRN

## 2016-10-10 MED ORDER — ONDANSETRON HCL 4 MG/2ML IJ SOLN: 4.0000 mg | Freq: Four times a day (QID) | INTRAMUSCULAR | Status: DC | PRN

## 2016-10-10 MED ORDER — CARVEDILOL 12.5 MG PO TABS: 12.5000 mg | ORAL_TABLET | Freq: Two times a day (BID) | ORAL | 0 refills | Status: DC

## 2016-10-10 MED ORDER — METRONIDAZOLE 0.75 % EX CREA
1.0000 "application " | TOPICAL_CREAM | Freq: Every day | CUTANEOUS | Status: DC
  Filled 2016-10-10: qty 45

## 2016-10-10 MED ORDER — VITAMIN D 1000 UNITS PO TABS: 2000.0000 [IU] | ORAL_TABLET | Freq: Every day | ORAL | Status: DC

## 2016-10-10 MED ORDER — HYDRALAZINE HCL 20 MG/ML IJ SOLN
5.0000 mg | Freq: Once | INTRAMUSCULAR | Status: AC
  Administered 2016-10-10: 5 mg via INTRAVENOUS
  Filled 2016-10-10: qty 1

## 2016-10-10 MED ORDER — BRINZOLAMIDE 1 % OP SUSP
1.0000 [drp] | Freq: Two times a day (BID) | OPHTHALMIC | Status: DC
  Administered 2016-10-10: 1 [drp] via OPHTHALMIC
  Filled 2016-10-10: qty 10

## 2016-10-10 MED ORDER — NITROGLYCERIN 0.4 MG SL SUBL: 0.4000 mg | SUBLINGUAL_TABLET | SUBLINGUAL | Status: DC | PRN

## 2016-10-10 MED ORDER — POTASSIUM CHLORIDE 20 MEQ/15ML (10%) PO SOLN
20.0000 meq | Freq: Once | ORAL | Status: AC
  Administered 2016-10-10: 20 meq via ORAL
  Filled 2016-10-10: qty 15

## 2016-10-10 MED ORDER — LATANOPROST 0.005 % OP SOLN
1.0000 [drp] | Freq: Every day | OPHTHALMIC | Status: DC
  Filled 2016-10-10: qty 2.5

## 2016-10-10 MED ORDER — HYDRALAZINE HCL 20 MG/ML IJ SOLN: 5.0000 mg | INTRAMUSCULAR | Status: DC | PRN

## 2016-10-10 MED ORDER — ASPIRIN EC 81 MG PO TBEC: 81.0000 mg | DELAYED_RELEASE_TABLET | Freq: Every day | ORAL | Status: DC

## 2016-10-10 MED ORDER — ZOLPIDEM TARTRATE 5 MG PO TABS: 5.0000 mg | ORAL_TABLET | Freq: Every evening | ORAL | Status: DC | PRN

## 2016-10-10 NOTE — ED Provider Notes (Signed)
[facility] DEPT Provider Note   CSN: [id number] Arrival date & time: 10/09/16  2031  By signing my name below, I, Jeanell Sparrow, attest that this documentation has been prepared under the direction and in the presence of Loys Shugars, Barbette Hair, MD. Electronically Signed: Jeanell Sparrow, Scribe. 10/10/2016. 1:44 AM.  History   Chief Complaint Chief Complaint  Patient presents with  . Chest Pain   The history is provided by the patient. No language interpreter was used.   HPI Comments: [name] is a 83 y.o. female with a PMHx of HTN who presents to the Emergency Department complaining of intermittent moderate chest pain that started today. She states her pain started while she was in the kitchen. No current pain. Reports pain on and off that is not exertional since onset. She took aspirin PTA. She describes the pain as radiating to her back and neck. She had a hx of cardiac bypass surgery in 2005 and She had pain with similar radiation but different character. Today this more sharp.. Denies any recurrent use of blood thinners, diaphoresis, SOB, or other complaints at this time. Reports that she took her blood pressure medications this evening; however, initially she stated that she did not.   PCP: Tamala Julian Cardiologist: Ellyn Hack   Past Medical History:  Diagnosis Date  . Aneurysm ([place])   . Arthritis    osteoarthritis, back, hands, wrists  . Bursitis    hips bilat   . CAD (coronary artery disease), autologous vein bypass graft March 2008   Follow-up cath: December 2015 Occluded SVG-D1 and occluded SVG-RI.  [name] CAD in native artery   . Cancer ([name])    + basal cell- on leg  . Dyslipidemia    Statin intolerant  . Glaucoma   . History of kidney stones   . Hypertension   . Non-STEMI (non-ST elevated myocardial infarction) [facility]) October 2005   EF by 35-40%, echo 40-50%. Angiography: 99% mid LAD involving D1 followed by 70% mid LAD; 80% RI. --> CABG  . PONV (postoperative nausea and  vomiting)   . S/P CABG x 01 February 2004   LIMA-LAD, SVG-D1, SVG-RI.  [name] Statin intolerance   . Stroke [facility])     Patient Active Problem List   Diagnosis Date Noted  . Migraine equivalent   . Aneurysm, cerebral, nonruptured   . TIA (transient ischemic attack) 04/05/2016  . Stroke-like symptoms 04/05/2016  . Left-sided weakness 04/05/2016  . Confusion 04/05/2016  . Cerebellar infarct ([place]) 02/16/2016  . Stroke (cerebrum) ([place]) 01/21/2016  . Neurological deficit present 01/21/2016  . Greater trochanteric bursitis of right hip 06/15/2015  . Preoperative cardiovascular examination 05/13/2015  . Chest pain of uncertain etiology   . Chest pain 04/26/2014  . Hyperlipidemia LDL goal <70 05/14/2013  . Statin intolerance   . Essential hypertension   . CAD (coronary artery disease), autologous vein bypass graft 06/30/2006  . S/P CABG x 3 01/30/2004  . Non-STEMI (non-ST elevated myocardial infarction) ([place]) 01/30/2004    Past Surgical History:  Procedure Laterality Date  . BUNIONECTOMY     2002  . CARDIOVASCULAR STRESS TEST  05/23/2006   Mild lateral/inferolateral ischemia, likely due to occluded SVGs with exhisting disease.  [name] CAROTID DOPPLER  07/15/2009   Bilat ICAs 0-49% diameter reduction. Normal patency of Bilat subclavian arteries.  . CORONARY ARTERY BYPASS GRAFT  02/22/2004   x3. LIMA to distal LAD, SVG to first diag, SVG to ramus. SVG harvest from rt thigh.  . EP IMPLANTABLE DEVICE  N/A 03/02/2016   Procedure: Loop Recorder Insertion;  Surgeon: Thompson Grayer, MD;  Location: [place] CV LAB;  Service: Cardiovascular;  Laterality: N/A;  . EXCISION/RELEASE BURSA HIP Right 06/15/2015   Procedure: RIGHT HIP BURSECTOMY WITH GLUTEAL TENDON REPAIR;  Surgeon: Gaynelle Arabian, MD;  Location: [facility] ORS;  Service: Orthopedics;  Laterality: Right;  . EYE SURGERY     cataract surgery bilat   . IR ANGIO INTRA EXTRACRAN SEL COM CAROTID INNOMINATE BILAT MOD SED  08/07/2016  . IR ANGIO VERTEBRAL SEL  SUBCLAVIAN INNOMINATE BILAT MOD SED  08/07/2016  . IR GENERIC HISTORICAL  06/01/2016   IR RADIOLOGIST EVAL & MGMT 06/01/2016 [facility]-INTERV RAD  . LEFT HEART CATHETERIZATION WITH CORONARY ANGIOGRAM  02/19/2004   Significant 2 vessel CAD - LAD, D1 and RI  . LEFT HEART CATHETERIZATION WITH CORONARY/GRAFT ANGIOGRAM N/A 04/28/2014   Procedure: LEFT HEART CATHETERIZATION WITH Beatrix Fetters;  Surgeon: Sinclair Grooms, MD;  Location: [facility] CATH LAB: CTO of SVG-Diag & SVG-RI, patent LIMA-LAD. Patent native circumflex, LAD and RCA. 70% stenosis in a branch of RI. --> Does not explain "high risk perfusion study "  . LEFT HEART CATHETERIZATION WITH CORONARY/GRAFT ANGIOGRAM   07/25/2006   Totally occluded SVG to diag and ramus. Patent LIMA-LAD. Ramus 70-80% proximal stenosis and occluded vein graft.  [name] left total knee replacement      2001  . NM MYOVIEW LTD  January 2008; December 2015   a. Referred for For mild inferolateral and anterolateral/apical lateral defect with mild reversibility.;; b. Large defect in the anterior and inferior wall. Suggestive of potential infarct plus ischemia. HIGH RISK.  [name] RADIOLOGY WITH ANESTHESIA N/A 08/07/2016   Procedure: EMBOLIZATION;  Surgeon: Luanne Bras, MD;  Location: [place];  Service: Radiology;  Laterality: N/A;  . right total knee replacement      2003  . TRANSTHORACIC ECHOCARDIOGRAM  02/19/2004; December 2015   a. EF 45-50%, Normal LV function, moderate hypokinesis of anterior wall.;; b. EF 50-55%. No RWMA, GR 1 DD. Normal valves     OB History    No data available       Home Medications    Prior to Admission medications   Medication Sig Start Date End Date Taking? Authorizing Provider  acetaminophen (TYLENOL) 500 MG tablet Take 500 mg by mouth every 6 (six) hours as needed (for pain).     [provider]  aspirin EC 81 MG tablet Take 81 mg by mouth at bedtime.     [provider]  atenolol (TENORMIN) 25 MG tablet TAKE 1 TABLET (25 MG  TOTAL) BY MOUTH DAILY. Patient taking differently: TAKE 1 TABLET (25 MG TOTAL) BY MOUTH DAILY AT BEDTIME 05/08/16   Allred, Jeneen Rinks, MD  brimonidine (ALPHAGAN) 0.2 % ophthalmic solution Place 1 drop into both eyes 2 (two) times daily.    [provider]  brinzolamide (AZOPT) 1 % ophthalmic suspension Place 1 drop into both eyes 2 (two) times daily.    [provider]  Cholecalciferol (VITAMIN D3) 2000 UNITS capsule Take 2,000 Units by mouth at bedtime.     [provider]  clopidogrel (PLAVIX) 75 MG tablet TAKE 1 TABLET BY MOUTH EVERY DAY Patient taking differently: TAKE 1 TABLET BY MOUTH EVERY DAY AT BEDTIME 05/08/16   Allred, Jeneen Rinks, MD  latanoprost (XALATAN) 0.005 % ophthalmic solution Place 1 drop into both eyes at bedtime. 06/26/16   [provider]  lisinopril (PRINIVIL,ZESTRIL) 20 MG tablet Take 20 mg by mouth at  bedtime.     [provider]  metroNIDAZOLE (METROCREAM) 0.75 % cream Apply 1 application topically 2 (two) times daily.  04/17/13   [provider]  timolol (BETIMOL) 0.5 % ophthalmic solution Place 1 drop into both eyes 2 (two) times daily.    [provider]    Family History Family History  Problem Relation Age of Onset  . Alzheimer's disease Mother   . Alzheimer's disease Sister   . Hyperlipidemia Sister   . Hypertension Sister   . Diabetes Sister   . Hyperlipidemia Sister   . Hypertension Sister   . Pancreatitis Child     Social History Social History  Substance Use Topics  . Smoking status: Never Smoker  . Smokeless tobacco: Never Used  . Alcohol use 0.5 oz/week    1 Standard drinks or equivalent per week     Comment: 1 glass of liquor weekly      Allergies   Nsaids; Statins; Tricor [fenofibrate]; and Zetia [ezetimibe]   Review of Systems Review of Systems  Constitutional: Negative for diaphoresis.  Respiratory: Negative for shortness of breath.   Cardiovascular: Positive for chest pain.  Negative for leg swelling.  Gastrointestinal: Negative for abdominal pain, nausea and vomiting.  Neurological: Negative for dizziness and light-headedness.  Hematological: Does not bruise/bleed easily.  All other systems reviewed and are negative.    Physical Exam Updated Vital Signs BP (!) 204/81   Pulse 73   Temp 98.1 F (36.7 C) (Oral)   Resp 15   SpO2 98%   Physical Exam  Constitutional: She is oriented to person, place, and time. She appears well-developed and well-nourished. No distress.  HENT:  Head: Normocephalic and atraumatic.  Cardiovascular: Normal rate, regular rhythm and normal heart sounds.   No murmur heard. Pulmonary/Chest: Effort normal and breath sounds normal. No respiratory distress. She has no wheezes.  Anterior chest wall sternotomy scar clean dry and intact  Abdominal: Soft. Bowel sounds are normal. There is no tenderness.  Musculoskeletal:  Trace bilateral lower extremity edema  Neurological: She is alert and oriented to person, place, and time.  Skin: Skin is warm and dry.  Psychiatric: She has a normal mood and affect.  Nursing note and vitals reviewed.    ED Treatments / Results  DIAGNOSTIC STUDIES: Oxygen Saturation is 99% on RA, normal by my interpretation.    COORDINATION OF CARE: 1:48 AM- Pt advised of plan for treatment and pt agrees.  Labs (all labs ordered are listed, but only abnormal results are displayed) Labs Reviewed  BASIC METABOLIC PANEL - Abnormal; Notable for the following:       Result Value   Potassium 3.4 (*)    Glucose, Bld 105 (*)    GFR calc non Af Amer 60 (*)    All other components within normal limits  CBC  I-STAT TROPOININ, ED    EKG  EKG Interpretation  Date/Time:  Monday October 09 2016 20:39:23 EDT Ventricular Rate:  73 PR Interval:  218 QRS Duration: 80 QT Interval:  396 QTC Calculation: 436 R Axis:   49 Text Interpretation:  Sinus rhythm with sinus arrhythmia with 1st degree A-V block Otherwise  normal ECG Confirmed by Thayer Jew [phone]) on 10/10/2016 1:04:55 AM       Radiology Dg Chest 2 View  Result Date: 10/09/2016 CLINICAL DATA:  Mid chest pain EXAM: CHEST  2 VIEW COMPARISON:  04/26/2014 FINDINGS: Post sternotomy changes. Recording device over the left lower chest. No focal infiltrate or  effusion. Stable cardiomediastinal silhouette with atherosclerosis. No pneumothorax. Degenerative changes of the spine. IMPRESSION: No active cardiopulmonary disease. Electronically Signed   By: Donavan Foil M.D.   On: 10/09/2016 21:28    Procedures Procedures (including critical care time)  Medications Ordered in ED Medications  hydrALAZINE (APRESOLINE) injection 5 mg (not administered)     Initial Impression / Assessment and Plan / ED Course  I have reviewed the triage vital signs and the nursing notes.  Pertinent labs & imaging results that were available during my care of the patient were reviewed by me and considered in my medical decision making (see chart for details).    Patient presents with chest pain. Currently chest pain free. Intermittent and started while she was working in [employer]. She is high risk for ACS. Initial workup is reassuring including EKG and initial troponin. Chest x-ray is negative. She took aspirin prior to arrival. She is otherwise nontoxic-appearing. Feel she needs a formal chest pain rule out. Of note, she was notably hypertensive in triage. She states that she took her medications tonight. Blood pressure when I was in the room 003 systolic. Patient given one dose of IV hydralazine. Will admit for blood pressure control and chest pain rule out.   Final Clinical Impressions(s) / ED Diagnoses   Final diagnoses:  Other chest pain  Essential hypertension    New Prescriptions New Prescriptions   No medications on file   I personally performed the services described in this documentation, which was scribed in my presence. The recorded  information has been reviewed and is accurate.     Merryl Hacker, MD 10/10/16 [phone]

## 2016-10-10 NOTE — Progress Notes (Signed)
Patient admitted after midnight, please see H&P.  Labile BP.  Will allow 715N-539Y systolically.  No further cardiac work up in-house-- if BP stable this PM will d/c patient.  May need further titration of BP. Eulogio Bear

## 2016-10-10 NOTE — H&P (Signed)
History and Physical    [name] BJS:[id number] DOB: [birth year] DOA: 10/10/2016  Referring MD/NP/PA:   PCP: Carol Ada, MD   Patient coming from:  The patient is coming from home.  At baseline, pt is independent for most of ADL.  Chief Complaint: Chest pain  HPI: [name] is a 83 y.o. female with medical history significant of hypertension, hyperlipidemia, stroke, CAD, CABG, brain aneurysm, who presents with chest pain.  Patient states that her chest pain started at about 6:30 PM. It is located in the substernal area, 7 out of 10 in severity, dull, radiating to the right shoulder and the right ear. It is not associated with shortness of breath or cough. No fever or chills. Denies tenderness in the calf areas. Patient denies nausea, vomiting, diarrhea, abdominal pain. No symptoms of UTI or unilateral weakness. Initial blood pressure was elevated at 204/81, which improved to 165/89 after 1 dose of hydralazine 5 mg in ED.  ED Course: pt was found to have WBC 5.1, negative troponin, potassium is 3.4, creatinine normal, temperature normal, oxygen saturation 98% on room air, negative chest x-ray for acute abnormalities.  Review of Systems:   General: no fevers, chills, no changes in body weight, has fatigue HEENT: no blurry vision, hearing changes or sore throat Respiratory: no dyspnea, coughing, wheezing CV: has chest pain, no palpitations GI: no nausea, vomiting, abdominal pain, diarrhea, constipation GU: no dysuria, burning on urination, increased urinary frequency, hematuria  Ext: no leg edema Neuro: no unilateral weakness, numbness, or tingling, no vision change or hearing loss Skin: no rash, no skin tear. MSK: No muscle spasm, no deformity, no limitation of range of movement in spin Heme: No easy bruising.  Travel history: No recent long distant travel.  Allergy:  Allergies  Allergen Reactions  . Nsaids Other (See Comments)    URINARY RETENTION  . Statins  Other (See Comments)    MYALGIAS HURTS ALL OVER  . Tricor [Fenofibrate] Other (See Comments)    MYALGIAS HURTS ALL OVER  . Zetia [Ezetimibe] Other (See Comments)    MYALGIAS HURTS ALL OVER    Past Medical History:  Diagnosis Date  . Aneurysm ([place])   . Arthritis    osteoarthritis, back, hands, wrists  . Bursitis    hips bilat   . CAD (coronary artery disease), autologous vein bypass graft March 2008   Follow-up cath: December 2015 Occluded SVG-D1 and occluded SVG-RI.  [name] CAD in native artery   . Cancer ([name])    + basal cell- on leg  . Dyslipidemia    Statin intolerant  . Glaucoma   . History of kidney stones   . Hypertension   . Non-STEMI (non-ST elevated myocardial infarction) [facility]) October 2005   EF by 35-40%, echo 40-50%. Angiography: 99% mid LAD involving D1 followed by 70% mid LAD; 80% RI. --> CABG  . PONV (postoperative nausea and vomiting)   . S/P CABG x 01 February 2004   LIMA-LAD, SVG-D1, SVG-RI.  [name] Statin intolerance   . Stroke [facility])     Past Surgical History:  Procedure Laterality Date  . BUNIONECTOMY     2002  . CARDIOVASCULAR STRESS TEST  05/23/2006   Mild lateral/inferolateral ischemia, likely due to occluded SVGs with exhisting disease.  [name] CAROTID DOPPLER  07/15/2009   Bilat ICAs 0-49% diameter reduction. Normal patency of Bilat subclavian arteries.  . CORONARY ARTERY BYPASS GRAFT  02/22/2004   x3. LIMA to distal LAD, SVG to first diag, SVG  to ramus. SVG harvest from rt thigh.  . EP IMPLANTABLE DEVICE N/A 03/02/2016   Procedure: Loop Recorder Insertion;  Surgeon: Thompson Grayer, MD;  Location: [facility];  Service: Cardiovascular;  Laterality: N/A;  . EXCISION/RELEASE BURSA HIP Right 06/15/2015   Procedure: RIGHT HIP BURSECTOMY WITH GLUTEAL TENDON REPAIR;  Surgeon: Gaynelle Arabian, MD;  Location: [facility];  Service: Orthopedics;  Laterality: Right;  . EYE SURGERY     cataract surgery bilat   . IR ANGIO INTRA EXTRACRAN SEL COM CAROTID INNOMINATE BILAT MOD  SED  08/07/2016  . IR ANGIO VERTEBRAL SEL SUBCLAVIAN INNOMINATE BILAT MOD SED  08/07/2016  . IR GENERIC HISTORICAL  06/01/2016   IR RADIOLOGIST EVAL & MGMT 06/01/2016 [facility]INTER[facility]  . LEFT HEART CATHETERIZATION WITH CORONARY ANGIOGRAM  02/19/2004   Significant 2 vessel CAD - LAD, D1 and RI  . LEFT HEART CATHETERIZATION WITH CORONARY/GRAFT ANGIOGRAM N/A 04/28/2014   Procedure: LEFT HEART CATHETERIZATION WITH Beatrix Fetters;  Surgeon: Sinclair Grooms, MD;  Location: [facility] CATH LAB: CTO of SVG-Diag & SVG-RI, patent LIMA-LAD. Patent native circumflex, LAD and RCA. 70% stenosis in a branch of RI. --> Does not explain "high risk perfusion study "  . LEFT HEART CATHETERIZATION WITH CORONARY/GRAFT ANGIOGRAM   07/25/2006   Totally occluded SVG to diag and ramus. Patent LIMA-LAD. Ramus 70-80% proximal stenosis and occluded vein graft.  [name] left total knee replacement      2001  . NM MYOVIEW LTD  January 2008; December 2015   a. Referred for For mild inferolateral and anterolateral/apical lateral defect with mild reversibility.;; b. Large defect in the anterior and inferior wall. Suggestive of potential infarct plus ischemia. HIGH RISK.  [name] RADIOLOGY WITH ANESTHESIA N/A 08/07/2016   Procedure: EMBOLIZATION;  Surgeon: Luanne Bras, MD;  Location: [place];  Service: Radiology;  Laterality: N/A;  . right total knee replacement      2003  . TRANSTHORACIC ECHOCARDIOGRAM  02/19/2004; December 2015   a. EF 45-50%, Normal LV function, moderate hypokinesis of anterior wall.;; b. EF 50-55%. No RWMA, GR 1 DD. Normal valves     Social History:  reports that she has never smoked. She has never used smokeless tobacco. She reports that she drinks about 0.5 oz of alcohol per week . She reports that she does not use drugs.  Family History:  Family History  Problem Relation Age of Onset  . Alzheimer's disease Mother   . Alzheimer's disease Sister   . Hyperlipidemia Sister   . Hypertension Sister   . Diabetes Sister    . Hyperlipidemia Sister   . Hypertension Sister   . Pancreatitis Child      Prior to Admission medications   Medication Sig Start Date End Date Taking? Authorizing Provider  acetaminophen (TYLENOL) 500 MG tablet Take 500 mg by mouth every 6 (six) hours as needed (for pain).    Yes [provider]  aspirin EC 81 MG tablet Take 81 mg by mouth at bedtime.    Yes [provider]  atenolol (TENORMIN) 25 MG tablet TAKE 1 TABLET (25 MG TOTAL) BY MOUTH DAILY. Patient taking differently: TAKE 2 TABLET (50 MG TOTAL) BY MOUTH DAILY. 05/08/16  Yes Allred, Jeneen Rinks, MD  brimonidine (ALPHAGAN P) 0.1 % SOLN Place 1 drop into both eyes 2 (two) times daily.    Yes [provider]  brinzolamide (AZOPT) 1 % ophthalmic suspension Place 1 drop into both eyes 2 (two) times daily.   Yes [provider]  Cholecalciferol (VITAMIN D3) 2000 UNITS capsule Take 2,000 Units by mouth at bedtime.    Yes [provider]  clopidogrel (PLAVIX) 75 MG tablet TAKE 1 TABLET BY MOUTH EVERY DAY 05/08/16  Yes Allred, Jeneen Rinks, MD  latanoprost (XALATAN) 0.005 % ophthalmic solution Place 1 drop into both eyes at bedtime. 06/26/16  Yes [provider]  lisinopril (PRINIVIL,ZESTRIL) 20 MG tablet Take 20 mg by mouth at bedtime.    Yes [provider]  metroNIDAZOLE (METROCREAM) 0.75 % cream Apply 1 application topically daily.  04/17/13  Yes [provider]  timolol (BETIMOL) 0.5 % ophthalmic solution Place 1 drop into both eyes 2 (two) times daily.   Yes [provider]    Physical Exam: Vitals:   10/10/16 0200 10/10/16 0244 10/10/16 0313 10/10/16 0314  BP: (!) 165/89 (!) 177/85 (!) 146/78   Pulse: 65 73  72  Resp: 19 18  18   Temp:      TempSrc:      SpO2: 98% 98%  98%   General: Not in acute distress HEENT:       Eyes: PERRL, EOMI, no scleral icterus.       ENT: No discharge from the ears and nose, no pharynx injection, no tonsillar enlargement.         Neck: No JVD, no bruit, no mass felt. Heme: No neck lymph node enlargement. Cardiac: S1/S2, RRR, No murmurs, No gallops or rubs. Respiratory:  No rales, wheezing, rhonchi or rubs. GI: Soft, nondistended, nontender, no rebound pain, no organomegaly, BS present. GU: No hematuria Ext: No pitting leg edema bilaterally. 2+DP/PT pulse bilaterally. Musculoskeletal: No joint deformities, No joint redness or warmth, no limitation of ROM in spin. Skin: No rashes.  Neuro: Alert, oriented X3, cranial nerves II-XII grossly intact, moves all extremities normally. Psych: Patient is not psychotic, no suicidal or hemocidal ideation.  Labs on Admission: I have personally reviewed following labs and imaging studies  CBC:  Recent Labs Lab 10/09/16 2051  WBC 5.1  HGB 13.2  HCT 39.6  MCV 91.9  PLT 333   Basic Metabolic Panel:  Recent Labs Lab 10/09/16 2051  NA 142  K 3.4*  CL 109  CO2 22  GLUCOSE 105*  BUN 16  CREATININE 0.87  CALCIUM 9.6   GFR: CrCl cannot be calculated (Unknown ideal weight.). Liver Function Tests: No results for input(s): AST, ALT, ALKPHOS, BILITOT, PROT, ALBUMIN in the last 168 hours. No results for input(s): LIPASE, AMYLASE in the last 168 hours. No results for input(s): AMMONIA in the last 168 hours. Coagulation Profile: No results for input(s): INR, PROTIME in the last 168 hours. Cardiac Enzymes: No results for input(s): CKTOTAL, CKMB, CKMBINDEX, TROPONINI in the last 168 hours. BNP (last 3 results) No results for input(s): PROBNP in the last 8760 hours. HbA1C: No results for input(s): HGBA1C in the last 72 hours. CBG: No results for input(s): GLUCAP in the last 168 hours. Lipid Profile: No results for input(s): CHOL, HDL, LDLCALC, TRIG, CHOLHDL, LDLDIRECT in the last 72 hours. Thyroid Function Tests: No results for input(s): TSH, T4TOTAL, FREET4, T3FREE, THYROIDAB in the last 72 hours. Anemia Panel: No results for input(s): VITAMINB12, FOLATE, FERRITIN,  TIBC, IRON, RETICCTPCT in the last 72 hours. Urine analysis:    Component Value Date/Time   COLORURINE STRAW (A) 04/05/2016 1234   APPEARANCEUR CLEAR 04/05/2016 1234   APPEARANCEUR Clear 01/27/2016 1115   LABSPEC 1.006 04/05/2016 1234   PHURINE 6.0 04/05/2016 1234   GLUCOSEU  NEGATIVE 04/05/2016 1234   HGBUR NEGATIVE 04/05/2016 [place] 04/05/2016 1234   BILIRUBINUR Negative 01/27/2016 [place] 04/05/2016 1234   PROTEINUR NEGATIVE 04/05/2016 1234   NITRITE NEGATIVE 04/05/2016 1234   LEUKOCYTESUR NEGATIVE 04/05/2016 1234   LEUKOCYTESUR Negative 01/27/2016 1115   Sepsis Labs: @LABRCNTIP (procalcitonin:4,lacticidven:4) )No results found for this or any previous visit (from the past 240 hour(s)).   Radiological Exams on Admission: Dg Chest 2 View  Result Date: 10/09/2016 CLINICAL DATA:  Mid chest pain EXAM: CHEST  2 VIEW COMPARISON:  04/26/2014 FINDINGS: Post sternotomy changes. Recording device over the left lower chest. No focal infiltrate or effusion. Stable cardiomediastinal silhouette with atherosclerosis. No pneumothorax. Degenerative changes of the spine. IMPRESSION: No active cardiopulmonary disease. Electronically Signed   By: Donavan Foil M.D.   On: 10/09/2016 21:28     EKG: Independently reviewed.  Sinus rhythm, QTC 436, early R-wave progression, nonspecific T-wave change.   Assessment/Plan Principal Problem:   Chest pain Active Problems:   S/P CABG x 3   Statin intolerance   CAD (coronary artery disease), autologous vein bypass graft   Essential hypertension   Hyperlipidemia LDL goal <70   Stroke (cerebrum) ([name])   Hypertensive urgency   Hypokalemia   Chest pain and hx of CABG: s/p of CABG. Likely due to demand ischemia secondary to hypertensive urgency. Initial troponin negative. Chest x-ray negative for pneumonia. No shortness of breath or signs of DVT, less likely to have PE.  -will place on Tele bed for obs - cycle CE q6  x3 and repeat EKG in the am  - Nitroglycerin, Morphine, and aspirin and Atenolol - pt is allergic to statin - Risk factor stratification: will check FLP and A1C  - 2d echo  Hypertensive urgency: Initial blood pressure was elevated at 204/81, which improved to 165/89 after 1 dose of hydralazine 5 mg in ED. -continue home atenolol, lisinopril -IV hydralazine when necessary  Hx of Stroke (cerebrum) ([place]): -continue ASA, plavix  Hypokalemia: K=3.4 on admission. - Repleted  DVT ppx:  SQ Lovenox Code Status: DNR (I discussed with patient, and explained the meaning of CODE STATUS. Patient wants to be DNR) Family Communication: None at bed side.   Disposition Plan:  Anticipate discharge back to previous home environment Consults called:  none Admission status: Obs / tele    Date of Service 10/10/2016    Ivor Costa [facility] Pager [phone]  If 7PM-7AM, please contact night-coverage [email or web address] Password [facility] 10/10/2016, 3:38 AM

## 2016-10-10 NOTE — Care Management Obs Status (Signed)
[place] NOTIFICATION   Patient Details  Name: LAURY HUIZAR MRN: [id number] Date of Birth: 1931/07/07   Medicare Observation Status Notification Given:  Yes    Bethena Roys, RN 10/10/2016, 10:38 AM

## 2016-10-10 NOTE — Care Management Note (Signed)
Case Management Note  Patient Details  Name: [name] MRN: [id number] Date of Birth: [birth year]  Subjective/Objective: Pt presented for Chest Pain. Pt is from home with granddaughter. PTA-pt was independent. Pt has a RW, however does not use. Plan will be to return home once stable.                    Action/Plan: No further needs from CM at this time.   Expected Discharge Date:                  Expected Discharge Plan:  Home/Self Care  In-House Referral:  NA  Discharge planning Services  CM Consult  Post Acute Care Choice:  NA Choice offered to:  NA  DME Arranged:  N/A DME Agency:  NA  HH Arranged:  NA HH Agency:  NA  Status of Service:  Completed, signed off  If discussed at [place] of Stay Meetings, dates discussed:    Additional Comments:  Bethena Roys, RN 10/10/2016, 10:39 AM

## 2016-10-10 NOTE — Consult Note (Signed)
Cardiology Consultation:   Patient ID: [name]; [id number]; 02-07-1932   Admit date: 10/10/2016 Date of Consult: 10/10/2016  Primary Care Provider: Carol Ada, MD Primary Cardiologist: Dr. Ellyn Hack  Primary Electrophysiologist:  Dr. Rayann Heman    Patient Profile:   [name] is a 83 y.o. female with a hx of CAD s/p CABG, HTN, HLD, chronic diastolic CHF, TIA s/p ILR (follow by Dr. Rayann Heman) who is being seen today for the evaluation of chest pain at the request of Dr. Eliseo Squires.   She had a false positive stress test in 2015. Follow up cath 04/28/14 as below  IMPRESSION:  1. 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  2. Widely patent  Native circumflex, LAD, and RCA.  3. Widely patent ramus intermedius with perhaps a 70% stenosis in a branch of the main vessel.  4. Normal left ventricular systolic function  5. When compared to the angiography performed in 2008, no changes occurred.  RECOMMENDATION:  Continue risk factor modification. No further evaluation or treatment is necessary. It is doubtful that the chest discomfort was ischemic in origin. The "high risk perfusion study" is not explained by the current angiography.  Per Dr. Allison Quarry note she is reluctant to pursue another Myoview in the future due to false positive stress test.  Would consider stress echo although this less likely to be helpful post CABG patient.  Last echo 12/2015 showed normal LV function with grade 2 DD, mild MR.   History of Present Illness:   [name] has an episode of epigastric pain yesterday after one hour of diner. She described the pain as "pressure and pain". Intermittently lasted for few hours. No other associated symptoms. BP elevated to 195 at home. Did not used any nitro. Says this pain similar but less intensity to prior MI. Up arrival to Er, Her BP was 204/81 that improved with antihypertensive.  Usually her BP stays normal at home. Compliant with medications. No palpitations, dyspnea, orthopnea, PND, dizziness, nausea, vomiting or blood in her stool or urine.   EKG showed normal sinus rhythm with TWI in lead V1 and V2 which is chronic. Repeat ekg this morning without acute changes - personally reviewed. Troponin <0.03 x2. BNP 309. LDL 158. K 3.4. CXR clear.   No recurrent chest pain since treated BP in ER.   Past Medical History:  Diagnosis Date  . Aneurysm ([place])   . Arthritis    osteoarthritis, back, hands, wrists  . Bursitis    hips bilat   . CAD (coronary artery disease), autologous vein bypass graft March 2008   Follow-up cath: December 2015 Occluded SVG-D1 and occluded SVG-RI.  [name] CAD in native artery   . Cancer ([name])    + basal cell- on leg  . Dyslipidemia    Statin intolerant  . Glaucoma   . History of kidney stones   . Hypertension   . Non-STEMI (non-ST elevated myocardial infarction) [facility]) October 2005   EF by 35-40%, echo 40-50%. Angiography: 99% mid LAD involving D1 followed by 70% mid LAD; 80% RI. --> CABG  . PONV (postoperative nausea and vomiting)   . S/P CABG x 01 February 2004   LIMA-LAD, SVG-D1, SVG-RI.  [name] Statin intolerance   . Stroke [facility])     Past Surgical History:  Procedure Laterality Date  . BUNIONECTOMY     2002  . CARDIOVASCULAR STRESS TEST  05/23/2006  Mild lateral/inferolateral ischemia, likely due to occluded SVGs with exhisting disease.  [name] CAROTID DOPPLER  07/15/2009   Bilat ICAs 0-49% diameter reduction. Normal patency of Bilat subclavian arteries.  . CORONARY ARTERY BYPASS GRAFT  02/22/2004   x3. LIMA to distal LAD, SVG to first diag, SVG to ramus. SVG harvest from rt thigh.  . EP IMPLANTABLE DEVICE N/A 03/02/2016   Procedure: Loop Recorder Insertion;  Surgeon: Thompson Grayer, MD;  Location: [place] [facility];  Service: Cardiovascular;  Laterality: N/A;  . EXCISION/RELEASE BURSA HIP Right 06/15/2015   Procedure: RIGHT HIP BURSECTOMY WITH  GLUTEAL TENDON REPAIR;  Surgeon: Gaynelle Arabian, MD;  Location: [facility];  Service: Orthopedics;  Laterality: Right;  . EYE SURGERY     cataract surgery bilat   . IR ANGIO INTRA EXTRACRAN SEL COM CAROTID INNOMINATE BILAT MOD SED  08/07/2016  . IR ANGIO VERTEBRAL SEL SUBCLAVIAN INNOMINATE BILAT MOD SED  08/07/2016  . IR GENERIC HISTORICAL  06/01/2016   IR RADIOLOGIST EVAL & MGMT 06/01/2016 [facility]INTER[facility] RAD  . LEFT HEART CATHETERIZATION WITH CORONARY ANGIOGRAM  02/19/2004   Significant 2 vessel CAD - LAD, D1 and RI  . LEFT HEART CATHETERIZATION WITH CORONARY/GRAFT ANGIOGRAM N/A 04/28/2014   Procedure: LEFT HEART CATHETERIZATION WITH Beatrix Fetters;  Surgeon: Sinclair Grooms, MD;  Location: [facility] CATH LAB: CTO of SVG-Diag & SVG-RI, patent LIMA-LAD. Patent native circumflex, LAD and RCA. 70% stenosis in a branch of RI. --> Does not explain "high risk perfusion study "  . LEFT HEART CATHETERIZATION WITH CORONARY/GRAFT ANGIOGRAM   07/25/2006   Totally occluded SVG to diag and ramus. Patent LIMA-LAD. Ramus 70-80% proximal stenosis and occluded vein graft.  [name] left total knee replacement      2001  . NM MYOVIEW LTD  January 2008; December 2015   a. Referred for For mild inferolateral and anterolateral/apical lateral defect with mild reversibility.;; b. Large defect in the anterior and inferior wall. Suggestive of potential infarct plus ischemia. HIGH RISK.  [name] RADIOLOGY WITH ANESTHESIA N/A 08/07/2016   Procedure: EMBOLIZATION;  Surgeon: Luanne Bras, MD;  Location: [place];  Service: Radiology;  Laterality: N/A;  . right total knee replacement      2003  . TRANSTHORACIC ECHOCARDIOGRAM  02/19/2004; December 2015   a. EF 45-50%, Normal LV function, moderate hypokinesis of anterior wall.;; b. EF 50-55%. No RWMA, GR 1 DD. Normal valves      Inpatient Medications: Scheduled Meds: . aspirin EC  81 mg Oral QHS  . atenolol  50 mg Oral Daily  . brimonidine  1 drop Both Eyes BID  . brinzolamide  1 drop Both  Eyes BID  . cholecalciferol  2,000 Units Oral QHS  . clopidogrel  75 mg Oral Daily  . enoxaparin (LOVENOX) injection  40 mg Subcutaneous Daily  . latanoprost  1 drop Both Eyes QHS  . lisinopril  20 mg Oral QHS  . metroNIDAZOLE   Topical Daily  . timolol  1 drop Both Eyes BID   Continuous Infusions:  PRN Meds: acetaminophen, hydrALAZINE, morphine injection, nitroGLYCERIN, ondansetron (ZOFRAN) IV, zolpidem  Allergies:    Allergies  Allergen Reactions  . Nsaids Other (See Comments)    URINARY RETENTION  . Statins Other (See Comments)    MYALGIAS HURTS ALL OVER  . Tricor [Fenofibrate] Other (See Comments)    MYALGIAS HURTS ALL OVER  . Zetia [Ezetimibe] Other (See Comments)    MYALGIAS HURTS ALL OVER    Social History:   Social History  Social History  . Marital status: Widowed    Spouse name: N/A  . Number of children: 2  . Years of education: 16+   Occupational History  . retired    Social History Main Topics  . Smoking status: Never Smoker  . Smokeless tobacco: Never Used  . Alcohol use 0.5 oz/week    1 Standard drinks or equivalent per week     Comment: 1 glass of liquor weekly   . Drug use: No  . Sexual activity: Not on file   Other Topics Concern  . Not on file   Social History Narrative   She is a widowed, mother of 2 grandmother of 3.    Lives with granddaughter.   Drinks coffee every morning. 1 cup.   She used to do exercising through the [employer] program as well as the [employer]. Unfortunately, this finding of the classes for seniors changed, and she was unable to make the new classes. She did not like exercising with non-seniors. Besides that she does existing disease, walks up and down the stairs, walk her dog. She is not as active as he had been before.   She never smoked, never drank alcohol.          Family History:   The patient's family history includes Alzheimer's disease in her mother and sister; Diabetes in her sister;  Hyperlipidemia in her sister and sister; Hypertension in her sister and sister; Pancreatitis in her child.  ROS:  Please see the history of present illness.  ROS All other ROS reviewed and negative.     Physical Exam/Data:   Vitals:   10/10/16 0244 10/10/16 0313 10/10/16 0314 10/10/16 0400  BP: (!) 177/85 (!) 146/78  (!) 168/81  Pulse: 73  72 73  Resp: 18  18 20   Temp:    97.8 F (36.6 C)  TempSrc:    Oral  SpO2: 98%  98% 100%  Weight:    129 lb 12.8 oz (58.9 kg)  Height:    5\' 6"  (1.676 m)    Intake/Output Summary (Last 24 hours) at 10/10/16 0803 Last data filed at 10/10/16 0630  Gross per 24 hour  Intake                0 ml  Output              500 ml  Net             -500 ml   Filed Weights   10/10/16 0400  Weight: 129 lb 12.8 oz (58.9 kg)   Body mass index is 20.95 kg/m.  General:  Well nourished, well developed, in no acute distress HEENT: normal Lymph: no adenopathy Neck: no JVD Endocrine:  No thryomegaly Vascular: No carotid bruits; FA pulses 2+ bilaterally without bruits  Cardiac:  normal S1, S2; RRR; no murmur  Lungs:  clear to auscultation bilaterally, no wheezing, rhonchi or rales  Abd: soft, nontender, no hepatomegaly  Ext: no edema Musculoskeletal:  No deformities, BUE and BLE strength normal and equal Skin: warm and dry  Neuro:  CNs 2-12 intact, no focal abnormalities noted Psych:  Normal affect   Laboratory Data:  Chemistry Recent Labs Lab 10/09/16 2051  NA 142  K 3.4*  CL 109  CO2 22  GLUCOSE 105*  BUN 16  CREATININE 0.87  CALCIUM 9.6  GFRNONAA 60*  GFRAA >60  ANIONGAP 11    No results for input(s): PROT, ALBUMIN, AST, ALT, ALKPHOS, BILITOT  in the last 168 hours. Hematology Recent Labs Lab 10/09/16 2051  WBC 5.1  RBC 4.31  HGB 13.2  HCT 39.6  MCV 91.9  MCH 30.6  MCHC 33.3  RDW 12.9  PLT 241   Cardiac Enzymes Recent Labs Lab 10/10/16 0424 10/10/16 0614  TROPONINI <0.03 <0.03    Recent Labs Lab 10/09/16 2106    TROPIPOC 0.00    BNP Recent Labs Lab 10/10/16 0424  BNP 309.7*    DDimer No results for input(s): DDIMER in the last 168 hours.  Radiology/Studies:  Dg Chest 2 View  Result Date: 10/09/2016 CLINICAL DATA:  Mid chest pain EXAM: CHEST  2 VIEW COMPARISON:  04/26/2014 FINDINGS: Post sternotomy changes. Recording device over the left lower chest. No focal infiltrate or effusion. Stable cardiomediastinal silhouette with atherosclerosis. No pneumothorax. Degenerative changes of the spine. IMPRESSION: No active cardiopulmonary disease. Electronically Signed   By: Donavan Foil M.D.   On: 10/09/2016 21:28    Assessment and Plan:   1. Chest pain moderate risk - Likely due to hypertensive urgency. Troponin negative x 2. EKG without acute changes. No recurrent chest pain. She has ruled out. Will defer further evaluation as outpatient by Dr. Ellyn Hack.   2. CAD s/p CABG - hx of false positive stress test in 2015. Follow up cath 03/2014 showed stable anatomy (as above) compared to prior cath. Per Dr. Allison Quarry note would consider stress echo although this less likely to be helpful post CABG patient. - Continue ASA and plavix.   3. Hypertensive Urgency - BP still elevated. Will change atenelol 50mg  qd--> coreg 12.5mg  BID. Continue lisinopril to 20mg  qd.    Jarrett Soho, PA  10/10/2016 8:03 AM

## 2016-10-10 NOTE — ED Notes (Signed)
Call to 3W to give report.  RN to call back

## 2016-10-10 NOTE — Discharge Summary (Addendum)
Physician Discharge Summary  [name] LOV:[id number] DOB: 1931/07/13 DOA: 10/10/2016  PCP: Carol Ada, MD  Admit date: 10/10/2016 Discharge date: 10/10/2016   Recommendations for Outpatient Follow-Up:   Monitor BP-- BB changed to coreg  Discharge Diagnosis:   Principal Problem:   Chest pain Active Problems:   S/P CABG x 3   Statin intolerance   CAD (coronary artery disease), autologous vein bypass graft   Essential hypertension   Hyperlipidemia LDL goal <70   Stroke (cerebrum) ([name])   Hypertensive urgency   Hypokalemia   Discharge disposition:  Home.  Discharge Condition: Improved.  Diet recommendation: Low sodium, heart healthy..  Wound care: None.   History of Present Illness:   [name] is a 83 y.o. female with medical history significant of hypertension, hyperlipidemia, stroke, CAD, CABG, brain aneurysm, who presents with chest pain.  Patient states that her chest pain started at about 6:30 PM. It is located in the substernal area, 7 out of 10 in severity, dull, radiating to the right shoulder and the right ear. It is not associated with shortness of breath or cough. No fever or chills. Denies tenderness in the calf areas. Patient denies nausea, vomiting, diarrhea, abdominal pain. No symptoms of UTI or unilateral weakness. Initial blood pressure was elevated at 204/81, which improved to 165/89 after 1 dose of hydralazine 5 mg in ED.   Hospital Course by Problem:   Chest pain moderate risk - Likely due to hypertensive urgency -Troponin negative  -EKG without acute changes  CAD s/p CABG - hx of false positive stress test in 2015. Follow up cath 03/2014 showed stable anatomy (as above) compared to prior cath. Per Dr. Allison Quarry note would consider stress echo -- defer to Dr. Ellyn Hack - Continue ASA and plavix.   Hypertensive Urgency - change atenelol 50mg  qd--> coreg 12.5mg  BID. Continue lisinopril to 20mg  qd.   Hx of Stroke (cerebrum)  ([place]): -continue ASA, plavix  Hypokalemia: K=3.4 on admission. - Repleted   Medical Consultants:    cards   Discharge Exam:   Vitals:   10/10/16 1118 10/10/16 1309  BP: (!) 112/53 (!) 116/56  Pulse:  62  Resp:  18  Temp:  98.3 F (36.8 C)   Vitals:   10/10/16 0400 10/10/16 0837 10/10/16 1118 10/10/16 1309  BP: (!) 168/81 (!) 171/70 (!) 112/53 (!) 116/56  Pulse: 73 70  62  Resp: 20 20  18   Temp: 97.8 F (36.6 C) 98.2 F (36.8 C)  98.3 F (36.8 C)  TempSrc: Oral Axillary  Axillary  SpO2: 100% 99%  99%  Weight: 58.9 kg (129 lb 12.8 oz)     Height: 5\' 6"  (1.676 m)       Gen:  NAD   The results of significant diagnostics from this hospitalization (including imaging, microbiology, ancillary and laboratory) are listed below for reference.     Procedures and Diagnostic Studies:   No results found.   Labs:   Basic Metabolic Panel:  Recent Labs Lab 10/09/16 2051  NA 142  K 3.4*  CL 109  CO2 22  GLUCOSE 105*  BUN 16  CREATININE 0.87  CALCIUM 9.6   GFR Estimated Creatinine Clearance: 45.6 mL/min (by C-G formula based on SCr of 0.87 mg/dL). Liver Function Tests: No results for input(s): AST, ALT, ALKPHOS, BILITOT, PROT, ALBUMIN in the last 168 hours. No results for input(s): LIPASE, AMYLASE in the last 168 hours. No results for input(s): AMMONIA in the last 168 hours. Coagulation  profile No results for input(s): INR, PROTIME in the last 168 hours.  CBC:  Recent Labs Lab 10/09/16 2051  WBC 5.1  HGB 13.2  HCT 39.6  MCV 91.9  PLT 241   Cardiac Enzymes:  Recent Labs Lab 10/10/16 0424 10/10/16 0614  TROPONINI <0.03 <0.03   BNP: Invalid input(s): POCBNP CBG: No results for input(s): GLUCAP in the last 168 hours. D-Dimer No results for input(s): DDIMER in the last 72 hours. Hgb A1c No results for input(s): HGBA1C in the last 72 hours. Lipid Profile  Recent Labs  10/10/16 0424  CHOL 237*  HDL 62  LDLCALC 158*  TRIG 85  CHOLHDL  3.8   Thyroid function studies No results for input(s): TSH, T4TOTAL, T3FREE, THYROIDAB in the last 72 hours.  Invalid input(s): FREET3 Anemia work up No results for input(s): VITAMINB12, FOLATE, FERRITIN, TIBC, IRON, RETICCTPCT in the last 72 hours. Microbiology No results found for this or any previous visit (from the past 240 hour(s)).   Discharge Instructions:   Discharge Instructions    Diet - low sodium heart healthy    Complete by:  As directed    Increase activity slowly    Complete by:  As directed      Allergies as of 10/10/2016      Reactions   Nsaids Other (See Comments)   URINARY RETENTION   Statins Other (See Comments)   MYALGIAS HURTS ALL OVER   Tricor [fenofibrate] Other (See Comments)   MYALGIAS HURTS ALL OVER   Zetia [ezetimibe] Other (See Comments)   MYALGIAS HURTS ALL OVER      Medication List    STOP taking these medications   atenolol 25 MG tablet Commonly known as:  TENORMIN     TAKE these medications   acetaminophen 500 MG tablet Commonly known as:  TYLENOL Take 500 mg by mouth every 6 (six) hours as needed (for pain).   aspirin EC 81 MG tablet Take 81 mg by mouth at bedtime.   brimonidine 0.1 % Soln Commonly known as:  ALPHAGAN P Place 1 drop into both eyes 2 (two) times daily.   brinzolamide 1 % ophthalmic suspension Commonly known as:  AZOPT Place 1 drop into both eyes 2 (two) times daily.   carvedilol 12.5 MG tablet Commonly known as:  COREG Take 1 tablet (12.5 mg total) by mouth 2 (two) times daily with a meal.   clopidogrel 75 MG tablet Commonly known as:  PLAVIX TAKE 1 TABLET BY MOUTH EVERY DAY   latanoprost 0.005 % ophthalmic solution Commonly known as:  XALATAN Place 1 drop into both eyes at bedtime.   lisinopril 20 MG tablet Commonly known as:  PRINIVIL,ZESTRIL Take 20 mg by mouth at bedtime.   metroNIDAZOLE 0.75 % cream Commonly known as:  METROCREAM Apply 1 application topically daily.   timolol 0.5 %  ophthalmic solution Commonly known as:  BETIMOL Place 1 drop into both eyes 2 (two) times daily.   Vitamin D3 2000 units capsule Take 2,000 Units by mouth at bedtime.      Follow-up Information    Carol Ada, MD Follow up in 1 week(s).   Specialty:  Family Medicine Why:  BP check  Contact information: [place] [address] [phone]        Leonie Man, MD Follow up in 4 week(s).   Specialty:  Cardiology Contact information: [address] [place] [address] [phone]  Time coordinating discharge: 25 min  Signed:  Evo Aderman U Leanora Murin   [facility] 10/10/2016, 2:36 PM

## 2016-10-11 LAB — HEMOGLOBIN A1C
Hgb A1c MFr Bld: 5.7 % — ABNORMAL HIGH (ref 4.8–5.6)
Mean Plasma Glucose: 117 mg/dL

## 2016-10-13 ENCOUNTER — Encounter: Payer: Self-pay | Admitting: Podiatry

## 2016-10-13 ENCOUNTER — Ambulatory Visit (INDEPENDENT_AMBULATORY_CARE_PROVIDER_SITE_OTHER): Payer: Medicare Other | Admitting: Podiatry

## 2016-10-13 DIAGNOSIS — L84 Corns and callosities: Secondary | ICD-10-CM

## 2016-10-13 DIAGNOSIS — Q828 Other specified congenital malformations of skin: Secondary | ICD-10-CM

## 2016-10-13 NOTE — Progress Notes (Signed)
Subjective:     Patient ID: [name], female   DOB: 26-Oct-1931, 83 y.o.   MRN: [id number]  HPIThis patient presentsa to the office for painful calluses under the ball of both feet.  She says calluses are painful walking and wearing shoes,.She presents for preventive footcare services.   Review of Systems     Objective:   Physical Exam GENERAL APPEARANCE: Alert, conversant. Appropriately groomed. No acute distress.  VASCULAR: Pedal pulses palpable at  [facility] and PT bilateral.  Capillary refill time is immediate to all digits,  Normal temperature gradient.  Digital hair growth is present bilateral  NEUROLOGIC: sensation is normal to 5.07 monofilament at 5/5 sites bilateral.  Light touch is intact bilateral, Muscle strength normal.  MUSCULOSKELETAL: acceptable muscle strength, tone and stability bilateral.  Intrinsic muscluature intact bilateral.  HAV 1st MPJ B/L.  Hammer toes 2-5 B/L.  DERMATOLOGIC: skin color, texture, and turgor are within normal limits.  No preulcerative lesions or ulcers  are seen, no interdigital maceration noted.  No open lesions present.  Digital nails are asymptomatic. No drainage noted. Porokeratosis noted sub 1 B/L.      Assessment:     Porokeratosis B/L     Plan:     Debride porokeratosis RTC 10 weeks.   Gardiner Barefoot DPM

## 2016-10-20 DIAGNOSIS — H401133 Primary open-angle glaucoma, bilateral, severe stage: Secondary | ICD-10-CM | POA: Diagnosis not present

## 2016-10-20 DIAGNOSIS — Z961 Presence of intraocular lens: Secondary | ICD-10-CM | POA: Diagnosis not present

## 2016-10-20 DIAGNOSIS — H43813 Vitreous degeneration, bilateral: Secondary | ICD-10-CM | POA: Diagnosis not present

## 2016-10-26 DIAGNOSIS — I2510 Atherosclerotic heart disease of native coronary artery without angina pectoris: Secondary | ICD-10-CM | POA: Diagnosis not present

## 2016-10-26 DIAGNOSIS — I10 Essential (primary) hypertension: Secondary | ICD-10-CM | POA: Diagnosis not present

## 2016-10-30 ENCOUNTER — Ambulatory Visit (INDEPENDENT_AMBULATORY_CARE_PROVIDER_SITE_OTHER): Payer: Medicare Other | Admitting: *Deleted

## 2016-10-30 DIAGNOSIS — I639 Cerebral infarction, unspecified: Secondary | ICD-10-CM | POA: Diagnosis not present

## 2016-10-30 NOTE — Progress Notes (Signed)
[facility] Summary Report / Loop Recorder 

## 2016-11-15 LAB — CUP PACEART REMOTE DEVICE CHECK
Implantable Pulse Generator Implant Date: 20171102
MDC IDC SESS DTM: 20180630233724

## 2016-11-23 DIAGNOSIS — I10 Essential (primary) hypertension: Secondary | ICD-10-CM | POA: Diagnosis not present

## 2016-11-23 DIAGNOSIS — R51 Headache: Secondary | ICD-10-CM | POA: Diagnosis not present

## 2016-11-27 ENCOUNTER — Ambulatory Visit (INDEPENDENT_AMBULATORY_CARE_PROVIDER_SITE_OTHER): Payer: Medicare Other | Admitting: *Deleted

## 2016-11-27 DIAGNOSIS — I639 Cerebral infarction, unspecified: Secondary | ICD-10-CM | POA: Diagnosis not present

## 2016-11-28 NOTE — Progress Notes (Signed)
[facility] Summary Report / Loop Recorder 

## 2016-12-09 LAB — CUP PACEART REMOTE DEVICE CHECK
Date Time Interrogation Session: 20180731000625
MDC IDC PG IMPLANT DT: 20171102

## 2016-12-09 NOTE — Progress Notes (Signed)
Carelink summary report received. Battery status OK. Normal device function. No new symptom episodes, tachy episodes, brady, or pause episodes. No new AF episodes. Monthly summary reports and ROV/PRN 

## 2016-12-15 ENCOUNTER — Ambulatory Visit (INDEPENDENT_AMBULATORY_CARE_PROVIDER_SITE_OTHER): Payer: Medicare Other | Admitting: Podiatry

## 2016-12-15 ENCOUNTER — Encounter: Payer: Self-pay | Admitting: Podiatry

## 2016-12-15 DIAGNOSIS — M79671 Pain in right foot: Secondary | ICD-10-CM | POA: Diagnosis not present

## 2016-12-15 DIAGNOSIS — Q828 Other specified congenital malformations of skin: Secondary | ICD-10-CM | POA: Diagnosis not present

## 2016-12-15 DIAGNOSIS — L84 Corns and callosities: Secondary | ICD-10-CM

## 2016-12-15 DIAGNOSIS — M79672 Pain in left foot: Secondary | ICD-10-CM | POA: Diagnosis not present

## 2016-12-17 NOTE — Progress Notes (Signed)
Subjective: Cherree presents the office today for hyperkeratotic lesions present on both feet with the right side worse the left. She states the areas are painful mostly with pressure and wearing shoes. She denies any drainage or swelling to the area denies any open sores. Denies any systemic complaints such as fevers, chills, nausea, vomiting. No acute changes since last appointment, and no other complaints at this time.   Objective: AAO x3, NAD DP/PT pulses palpable bilaterally, CRT less than 3 seconds Hyperkeratotic lesions present bilateral submetatarsal one with a right side worse than left which or pre-ulcerative. After debridement there is no underlying ulceration, drainage or any signs of infection present today.  No open lesions or other pre-ulcerative lesions.  No pain with calf compression, swelling, warmth, erythema  Assessment: Porokeratosis bilaterally  Plan: -All treatment options discussed with the patient including all alternatives, risks, complications.  -Lesions are sharply debrided 2 without any complications or bleeding. Offloading pads were dispensed. Daily foot inspection discussed. -RTC 10 weeks or sooner if needed. -Patient encouraged to call the office with any questions, concerns, change in symptoms.   Celesta Gentile, DPM

## 2016-12-20 ENCOUNTER — Ambulatory Visit: Payer: Medicare Other | Admitting: Podiatry

## 2016-12-27 ENCOUNTER — Ambulatory Visit (INDEPENDENT_AMBULATORY_CARE_PROVIDER_SITE_OTHER): Payer: Medicare Other | Admitting: *Deleted

## 2016-12-27 DIAGNOSIS — I639 Cerebral infarction, unspecified: Secondary | ICD-10-CM | POA: Diagnosis not present

## 2016-12-28 LAB — CUP PACEART REMOTE DEVICE CHECK
Implantable Pulse Generator Implant Date: 20171102
MDC IDC SESS DTM: 20180830041020

## 2016-12-28 NOTE — Progress Notes (Signed)
[facility] Summary Report / Loop Recorder 

## 2017-01-10 ENCOUNTER — Encounter: Payer: Self-pay | Admitting: Cardiology

## 2017-01-10 NOTE — Progress Notes (Signed)
Letter  

## 2017-01-19 DIAGNOSIS — Z961 Presence of intraocular lens: Secondary | ICD-10-CM | POA: Diagnosis not present

## 2017-01-19 DIAGNOSIS — H43813 Vitreous degeneration, bilateral: Secondary | ICD-10-CM | POA: Diagnosis not present

## 2017-01-19 DIAGNOSIS — H401133 Primary open-angle glaucoma, bilateral, severe stage: Secondary | ICD-10-CM | POA: Diagnosis not present

## 2017-01-24 ENCOUNTER — Telehealth: Payer: Self-pay | Admitting: *Deleted

## 2017-01-24 ENCOUNTER — Encounter: Payer: Self-pay | Admitting: Cardiology

## 2017-01-24 NOTE — Telephone Encounter (Signed)
FAXED  CLEARANCE  FOR DR GROAT  TRAB OD [facility] 02/22/17 --- TYPE OF ANESTHESIA--MAC  OKAY TO HOLD ASPIRIN   AND PLAVIX 5-7 DAYS PRE -OP;  AND POST -OP   1-2 DAYS , PER DR HARDING

## 2017-01-26 ENCOUNTER — Encounter: Payer: Medicare Other | Admitting: *Deleted

## 2017-01-30 ENCOUNTER — Ambulatory Visit (INDEPENDENT_AMBULATORY_CARE_PROVIDER_SITE_OTHER): Payer: Medicare Other | Admitting: Podiatry

## 2017-01-30 DIAGNOSIS — M79671 Pain in right foot: Secondary | ICD-10-CM

## 2017-01-30 DIAGNOSIS — Q828 Other specified congenital malformations of skin: Secondary | ICD-10-CM | POA: Diagnosis not present

## 2017-01-30 DIAGNOSIS — L84 Corns and callosities: Secondary | ICD-10-CM | POA: Diagnosis not present

## 2017-01-30 DIAGNOSIS — D689 Coagulation defect, unspecified: Secondary | ICD-10-CM

## 2017-01-30 DIAGNOSIS — M79672 Pain in left foot: Secondary | ICD-10-CM

## 2017-01-30 NOTE — Progress Notes (Addendum)
Subjective:     Patient ID: [name], female   DOB: [birth year], 83 y.o.   MRN: [id number]  HPIThis patient presentsa to the office for painful calluses under the ball of both feet.  She says calluses are painful walking and wearing shoes,.She presents for preventive footcare services.Patient takes plavix.   Review of Systems     Objective:   Physical Exam GENERAL APPEARANCE: Alert, conversant. Appropriately groomed. No acute distress.  VASCULAR: Pedal pulses palpable at  [facility] and PT bilateral.  Capillary refill time is immediate to all digits,  Normal temperature gradient.  Digital hair growth is present bilateral  NEUROLOGIC: sensation is normal to 5.07 monofilament at 5/5 sites bilateral.  Light touch is intact bilateral, Muscle strength normal.  MUSCULOSKELETAL: acceptable muscle strength, tone and stability bilateral.  Intrinsic muscluature intact bilateral.  HAV 1st MPJ B/L.  Hammer toes 2-5 B/L.  DERMATOLOGIC: skin color, texture, and turgor are within normal limits.  No preulcerative lesions or ulcers  are seen, no interdigital maceration noted.  No open lesions present.  Digital nails are asymptomatic. No drainage noted. Porokeratosis noted sub 1 B/L.      Assessment:     Porokeratosis B/L  Coagulation defect.    Plan:     Debride porokeratosis RTC 10 weeks.   Gardiner Barefoot DPM

## 2017-02-08 DIAGNOSIS — Z23 Encounter for immunization: Secondary | ICD-10-CM | POA: Diagnosis not present

## 2017-02-15 ENCOUNTER — Other Ambulatory Visit (HOSPITAL_COMMUNITY): Payer: Self-pay | Admitting: Interventional Radiology

## 2017-02-15 DIAGNOSIS — I771 Stricture of artery: Secondary | ICD-10-CM

## 2017-02-15 DIAGNOSIS — I729 Aneurysm of unspecified site: Secondary | ICD-10-CM

## 2017-03-08 DIAGNOSIS — H401113 Primary open-angle glaucoma, right eye, severe stage: Secondary | ICD-10-CM | POA: Diagnosis not present

## 2017-03-14 ENCOUNTER — Ambulatory Visit (INDEPENDENT_AMBULATORY_CARE_PROVIDER_SITE_OTHER): Payer: Medicare Other | Admitting: Cardiology

## 2017-03-14 ENCOUNTER — Encounter: Payer: Self-pay | Admitting: Cardiology

## 2017-03-14 VITALS — BP 140/80 | HR 72 | Ht 66.0 in | Wt 127.0 lb

## 2017-03-14 DIAGNOSIS — Z789 Other specified health status: Secondary | ICD-10-CM

## 2017-03-14 DIAGNOSIS — I214 Non-ST elevation (NSTEMI) myocardial infarction: Secondary | ICD-10-CM | POA: Diagnosis not present

## 2017-03-14 DIAGNOSIS — I25718 Atherosclerosis of autologous vein coronary artery bypass graft(s) with other forms of angina pectoris: Secondary | ICD-10-CM | POA: Diagnosis not present

## 2017-03-14 DIAGNOSIS — E785 Hyperlipidemia, unspecified: Secondary | ICD-10-CM | POA: Diagnosis not present

## 2017-03-14 DIAGNOSIS — I160 Hypertensive urgency: Secondary | ICD-10-CM | POA: Diagnosis not present

## 2017-03-14 DIAGNOSIS — I10 Essential (primary) hypertension: Secondary | ICD-10-CM

## 2017-03-14 MED ORDER — ATENOLOL 25 MG PO TABS: 25.0000 mg | ORAL_TABLET | Freq: Every day | ORAL | 11 refills | Status: AC

## 2017-03-14 NOTE — Progress Notes (Signed)
PCP: Carol Ada, MD  Clinic Note: Chief Complaint  Patient presents with  . Follow-up    Annual, and delayed hospital follow-up  . Coronary Artery Disease  . Hypertension    Labile  . Hyperlipidemia    Statin intolerant    HPI: [name] is a 83 y.o. female with a PMH below who presents today for annual follow-up CAD-CABG. Initially diagnosed with multivessel CAD in October 2005 --in the setting of a non-STEMI.  Was referred for CABG x 3.  Unfortunately follow-up cath showed both vein grafts to the ramus intermedius and diagonal occluded.  Native vessel disease did not appear to be significant disease likely leading to-vein graft occlusion because of competitive flow.  Competitive flow is also noted in the LIMA-LAD. - Also has risk factors of hypertension (labile with orthostatic hypotension) and dyslipidemia (with statin intolerance).  She also has history of TIA or cryptogenic stroke. She is also wearing a loop recorder for TIA/stroke She also has a known cerebral aneurysm.   --Per neurology notes, she has been having some visual hallucinations of men and women wearing colonial garb as well as cats and dogs with little kids.  Has also been having intermittent episodes of confusion. ->  Potentially Lewy body dementia  [name] was last seen in October 2017 -she was doing relatively well at that time.  Intermittent chest discomfort. I had referred her to our lipid clinic for possible PCSK9 inhibitor, but I do not see that she has done that.  Recent Hospitalizations:   04/05/2016: Admitted with acute onset left sided weakness and numbness as well as altered mental status.  Ruled out for stroke by MRI and CT is only a small right ICA aneurysm that was stable.  It was thought to be related to complex migraine versus TIA.   Loop recorder inserted March 02, 2016 --> for presumably cryptogenic stroke  Cerebral angiogram in April 2018  Admitted October 10, 2016 with  chest pain.  She had uncontrolled hypertension in the emergency room.  Cardiology consult (Dr. Debara Pickett) - Symptoms thought to be related to he uncontrolled hypertension with known coronary graft anatomy.   Studies Personally Reviewed - (if available, images/films reviewed: From Epic Chart or Care Everywhere)  Lower extremity venous Doppler April 06, 2016: No DVT  Cerebral Angiogram with Possible Right Internal Carotid Artery Aneurysm Embolization ([name]) August 07, 2016  Four-vessel cerebral angiogram (right CFA approach) -proximal 6.6 mm right ICA fusiform dilatation with bilobed outpouching.  Plan is for--> expectant management.  Interval History: [name] returns today for essentially scheduled follow-up visit as part of her annual follow-up, but never had a follow-up after her hospitalization in June.  She is a very difficult historian, and is somewhat confused interested in her answers.  She seems to have been doing relatively well of late.  Dr. Debara Pickett had suggested converting from atenolol to carvedilol during hospital stay, the patient decided that she did not want to make that change and is therefore back on her atenolol.  She is on 25 mg which was lower than before. She does not seem to have had any further episodes of chest tightness or pressure consistent with what she had in June.  No anginal symptoms with rest or exertion, but she may get a little bit short of breath when she walks around but not significantly.  She still does have some labile blood pressures that are being evaluated and treated elsewhere.  She clearly has labile  hypertension with significant drops in her pressures with significant orthostatic hypotension.  She does get dizzy with standing up, but has not had any syncope or near syncope.  No further TIA or amaurosis fugax symptoms.  She does have some mild edema but no significant PND orthopnea.  No rapid irregular heartbeats or palpitations.  No claudication.  She  asked about having an episode of a few days with significantly reduced urine output.  Is gotten better now, but it happens on occasion.  ROS: A comprehensive was performed. Review of Systems  Constitutional: Positive for malaise/fatigue.  HENT: Negative for congestion and nosebleeds.   Respiratory: Negative for cough and shortness of breath.   Gastrointestinal: Negative for blood in stool and melena.  Genitourinary: Negative for hematuria.  Musculoskeletal: Positive for joint pain. Negative for falls.  Neurological: Positive for dizziness (With change in position). Negative for focal weakness and weakness (Mild generalized weakness).  Endo/Heme/Allergies: Does not bruise/bleed easily.  Psychiatric/Behavioral: Positive for hallucinations (Noted in neurology clinic notes.  She did not mention today.) and memory loss.       Difficult/poor historian  All other systems reviewed and are negative.   I have reviewed and (if needed) personally updated the patient's problem list, medications, allergies, past medical and surgical history, social and family history.   Past Medical History:  Diagnosis Date  . Aneurysm ([place])   . Arthritis    osteoarthritis, back, hands, wrists  . Bursitis    hips bilat   . CAD (coronary artery disease), autologous vein bypass graft March 2008   Follow-up cath: December 2015 Occluded SVG-D1 and occluded SVG-RI.  [name] CAD in native artery 2005, 03/2014   a.  Severe disease noted in LAD & RI --> referred for CABG x3; b.  Follow-up cath for abnormal Myoview: Occluded SVG-RI and SVG-D1 with patent LIMA-LAD and competitive flow.  60-70% LAD and RI lesions.  Otherwise minimal disease.  EF 70%.  . Cancer ([place])    + basal cell- on leg  . Dyslipidemia    Statin intolerant  . Glaucoma   . History of kidney stones   . Hypertension   . Non-STEMI (non-ST elevated myocardial infarction) [facility]) October 2005   EF by 35-40%, echo 40-50%. Angiography: 99% mid LAD involving D1  followed by 70% mid LAD; 80% RI. --> CABG  . PONV (postoperative nausea and vomiting)   . S/P CABG x 01 February 2004   LIMA-LAD, SVG-D1, SVG-RI.  [name] Statin intolerance   . Stroke [facility])     Past Surgical History:  Procedure Laterality Date  . BUNIONECTOMY     2002  . CARDIOVASCULAR STRESS TEST  05/23/2006   Mild lateral/inferolateral ischemia, likely due to occluded SVGs with exhisting disease.  [name] CAROTID DOPPLER  07/15/2009   Bilat ICAs 0-49% diameter reduction. Normal patency of Bilat subclavian arteries.  . CORONARY ARTERY BYPASS GRAFT  02/22/2004   x3. LIMA to distal LAD, SVG to first diag, SVG to ramus. SVG harvest from rt thigh.  . EMBOLIZATION N/A 08/07/2016   Performed by Luanne Bras, MD at [facility] OR  . EYE SURGERY     cataract surgery bilat   . IR ANGIO INTRA EXTRACRAN SEL COM CAROTID INNOMINATE BILAT MOD SED  08/07/2016  . IR ANGIO VERTEBRAL SEL SUBCLAVIAN INNOMINATE BILAT MOD SED  08/07/2016  . IR GENERIC HISTORICAL  06/01/2016   IR RADIOLOGIST EVAL & MGMT 06/01/2016 [facility]-INTERV RAD  . LEFT HEART CATHETERIZATION WITH CORONARY ANGIOGRAM  02/19/2004  Significant 2 vessel CAD - LAD, D1 and RI  . LEFT HEART CATHETERIZATION WITH CORONARY/GRAFT ANGIOGRAM N/A 04/28/2014   Performed by Belva Crome, MD at [facility] CATH LAB  . LEFT HEART CATHETERIZATION WITH CORONARY/GRAFT ANGIOGRAM   07/25/2006   Totally occluded SVG to diag and ramus. Patent LIMA-LAD. Ramus 70-80% proximal stenosis and occluded vein graft.  [name] left total knee replacement      2001  . Loop Recorder Insertion N/A 03/02/2016   Performed by Thompson Grayer, MD at [place] CV LAB  . NM MYOVIEW LTD  January 2008; December 2015   a. Referred for For mild inferolateral and anterolateral/apical lateral defect with mild reversibility.;; b. Large defect in the anterior and inferior wall. Suggestive of potential infarct plus ischemia. HIGH RISK.  [name] RIGHT HIP BURSECTOMY WITH GLUTEAL TENDON REPAIR Right 06/15/2015   Performed by Gaynelle Arabian, MD at [facility] ORS  . right total knee replacement      2003  . TRANSTHORACIC ECHOCARDIOGRAM  02/19/2004; December 2015   a. EF 45-50%, Normal LV function, moderate hypokinesis of anterior wall.;; b. EF 50-55%. No RWMA, GR 1 DD. Normal valves     Cardiac Catheterization 04/28/2014: Findings did not explain high risk stress test results.  LAD patent with competitive flow from LIMA-distal vessel.  60-70% stenosis after SP1.    LCx widely patent with normal 3 small OM's.  RI widely patent with 60 and 70% stenosis (difficult to lay out)  RCA-dominant with large distribution PDA into PL branches.  Mild irregularities.  SVG-D1 & SVG-RI 100% occluded.  LIMA-LAD widely patent with competitive flow.  EF 70%.  Current Meds  Medication Sig  . acetaminophen (TYLENOL) 500 MG tablet Take 500 mg by mouth every 6 (six) hours as needed (for pain).   [name] aspirin EC 81 MG tablet Take 81 mg by mouth at bedtime.   [name] atenolol (TENORMIN) 25 MG tablet Take 1 tablet (25 mg total) daily by mouth. May take an additional 25 mg of medication daily, if SBP> 160 or DBP >100  . brimonidine (ALPHAGAN P) 0.1 % SOLN Place 1 drop into both eyes 2 (two) times daily.   . brinzolamide (AZOPT) 1 % ophthalmic suspension Place 1 drop into both eyes 2 (two) times daily.  . Cholecalciferol (VITAMIN D3) 2000 UNITS capsule Take 2,000 Units by mouth at bedtime.   . clopidogrel (PLAVIX) 75 MG tablet TAKE 1 TABLET BY MOUTH EVERY DAY  . latanoprost (XALATAN) 0.005 % ophthalmic solution Place 1 drop into both eyes at bedtime.  [name] lisinopril (PRINIVIL,ZESTRIL) 20 MG tablet Take 20 mg by mouth at bedtime.   . metroNIDAZOLE (METROCREAM) 0.75 % cream Apply 1 application topically daily.   [name] ofloxacin (OCUFLOX) 0.3 % ophthalmic solution INSTILL 1 DROP INTO RIGHT EYE FOUR TIMES A DAY STARTING 1 DAY BEFORE SURGERY  . prednisoLONE acetate (PRED FORTE) 1 % ophthalmic suspension INSTILL 1 DROP INTO RIGHT EYE FOUR TIMES A DAY STARTING 1 DAY BEFORE  SURGERY  . timolol (BETIMOL) 0.5 % ophthalmic solution Place 1 drop into both eyes 2 (two) times daily.  . [DISCONTINUED] atenolol (TENORMIN) 25 MG tablet Take 25 mg daily by mouth.    Allergies  Allergen Reactions  . Nsaids Other (See Comments)    URINARY RETENTION  . Statins Other (See Comments)    MYALGIAS HURTS ALL OVER  . Tricor [Fenofibrate] Other (See Comments)    MYALGIAS HURTS ALL OVER  . Zetia [Ezetimibe] Other (See Comments)  MYALGIAS HURTS ALL OVER    Social History   Socioeconomic History  . Marital status: Widowed    Spouse name: None  . Number of children: 2  . Years of education: 16+  . Highest education level: None  Social Needs  . Financial resource strain: None  . Food insecurity - worry: None  . Food insecurity - inability: None  . Transportation needs - medical: None  . Transportation needs - non-medical: None  Occupational History  . Occupation: retired  Tobacco Use  . Smoking status: Never Smoker  . Smokeless tobacco: Never Used  Substance and Sexual Activity  . Alcohol use: Yes    Alcohol/week: 0.5 oz    Types: 1 Standard drinks or equivalent per week    Comment: 1 glass of liquor weekly   . Drug use: No  . Sexual activity: None  Other Topics Concern  . None  Social History Narrative   She is a widowed, mother of 2 grandmother of 3.    Lives with granddaughter -[name]   Drinks coffee every morning. 1 cup.   She used to do exercising through the [employer] program as well as the [employer]. Unfortunately, this finding of the classes for seniors changed, and she was unable to make the new classes. She did not like exercising with non-seniors. Besides that she does existing disease, walks up and down the stairs, walk her dog. She is not as active as he had been before.   She never smoked, never drank alcohol.       family history includes Alzheimer's disease in her mother and sister; Diabetes in her sister; Hyperlipidemia in her  sister and sister; Hypertension in her sister and sister; Pancreatitis in her child.  Wt Readings from Last 3 Encounters:  03/14/17 127 lb (57.6 kg)  10/10/16 129 lb 12.8 oz (58.9 kg)  09/06/16 129 lb 6.4 oz (58.7 kg)    PHYSICAL EXAM BP 140/80   Pulse 72   Ht 5\' 6"  (1.676 m)   Wt 127 lb (57.6 kg)   BMI 20.50 kg/m  Physical Exam  Constitutional: She is oriented to person, place, and time. She appears well-developed and well-nourished. No distress.  HENT:  Head: Normocephalic and atraumatic.  Neck: No hepatojugular reflux and no JVD present. Carotid bruit is not present.  Cardiovascular: Normal rate, regular rhythm and intact distal pulses. Exam reveals no gallop and no friction rub.  No murmur heard. Nondisplaced PMI  Pulmonary/Chest: Effort normal and breath sounds normal. No respiratory distress. She has no wheezes. She has no rales.  Musculoskeletal: Normal range of motion. She exhibits edema (Mild pedal/ankle edema).  Neurological: She is alert and oriented to person, place, and time.  Skin: Skin is warm and dry. No erythema.  Psychiatric: She has a normal mood and affect. Her behavior is normal. Judgment and thought content normal.  She seems to be lucent, but clearly has some memory loss issues.  Somewhat confused on occasion.  Nursing note and vitals reviewed.    Adult ECG Report Not done   Other studies Reviewed: Additional studies/ records that were reviewed today include:  Recent Labs:   Lab Results  Component Value Date   CREATININE 0.87 10/09/2016   BUN 16 10/09/2016   NA 142 10/09/2016   K 3.4 (L) 10/09/2016   CL 109 10/09/2016   CO2 22 10/09/2016   Lab Results  Component Value Date   CHOL 237 (H) 10/10/2016   HDL 62 10/10/2016  [place] 158 (H) 10/10/2016   TRIG 85 10/10/2016   CHOLHDL 3.8 10/10/2016     ASSESSMENT / PLAN: Problem List Items Addressed This Visit    CAD (coronary artery disease), autologous vein bypass graft - Primary (Chronic)     Interesting that she did not really have any active anginal that this was evaluated.  Both vein grafts occluded probably because of competitive flow. In the absence of active anginal symptoms, I would not do another stress test.  She is not overly excited about the concept of invasive procedures and therefore I would not do screening studies. Continue aspirin beta-blocker and ACE inhibitor. She never did follow-up with [facility] lipid clinic to discuss other options.      Relevant Medications   atenolol (TENORMIN) 25 MG tablet   Essential hypertension (Chronic)    Labile blood pressure.  I am reluctant to be overly aggressive treating this.  I had talked before about switching her to carvedilol, but she would prefer not to.  What we will do for now is have her continue with current dose of lisinopril and atenolol.  But what I have instructed her to do is follow her blood pressures and take an additional 25 mg of atenolol if her blood pressures go above 160 systolic or 109 diastolic.      Relevant Medications   atenolol (TENORMIN) 25 MG tablet   Hyperlipidemia LDL goal <70 (Chronic)    Lipids continue to be well under control.  I had referred her to the lipid clinic, but this apparently was lost in translation with her hospitalizations for TIA.  She never did follow-up.  For now we will hold off on making any adjustments and we discussed in 6 months.      Relevant Medications   atenolol (TENORMIN) 25 MG tablet   Hypertensive urgency    She clearly has had some episodes of intermittent significant hypertension, but has also had hypotension. Plan for now will be to use as needed dose of atenolol for elevated blood pressures with systolics greater than 323 or diastolic greater than 557. Need to monitor for orthostatic hypotension.      Relevant Medications   atenolol (TENORMIN) 25 MG tablet   Non-STEMI (non-ST elevated myocardial infarction) ([name]) (Chronic)    Interestingly, she had a  non-STEMI with significant disease in the LAD during her initial MI in 2005 that not seen 10 years later. She had a vein graft that went apparently to the ramus intermedius that was occluded, and it is possible that the defect noted on Myoview was related to that.  However this did not seem to fit her distribution. EF now is return to mildly reduced Is on aspirin and Plavix along with beta-blocker and ACE inhibitor. Not on statin due to intolerance.      Relevant Medications   atenolol (TENORMIN) 25 MG tablet   Statin intolerance (Chronic)     She asked about decreased urine output.  I recommended increasing hydration.  Current medicines are reviewed at length with the patient today. (+/- concerns) n/a The following changes have been made: see below.   Patient Instructions  MEDICATION INSTRUCTIONS MAY TAKE AN EXTRA  ATENOLOL 25 MG IF SYSTOLIC ( TOP NUMBER) BLOOD PRESSURE GREATER THAN 322 OR DIASTOLIC ( BOTTOM NUMBER)  GREATER THAN 100.   KEEP HYDRATED - 8-10 GLASSES OF [name] BE CLEAR      Your physician wants you to follow-up in 6 MONTHS  WITH DR HARDING.You will receive a reminder letter in the mail two months in advance. If you don't receive a letter, please call our office to schedule the follow-up appointment.   If you need a refill on your cardiac medications before your next appointment, please call your pharmacy.    Studies Ordered:   No orders of the defined types were placed in this encounter.     Glenetta Hew, M.D., M.S. Interventional Cardiologist   Pager # [phone] Phone # [phone] [address] [place], [place] [address]

## 2017-03-14 NOTE — Patient Instructions (Signed)
MEDICATION INSTRUCTIONS MAY TAKE AN EXTRA  ATENOLOL 25 MG IF SYSTOLIC ( TOP NUMBER) BLOOD PRESSURE GREATER THAN 403 OR DIASTOLIC ( BOTTOM NUMBER)  GREATER THAN 100.   KEEP HYDRATED - 8-10 GLASSES OF 8 OZ  WATER DAILY  URINE SHOULD BE CLEAR      Your physician wants you to follow-up in [place] H[place].You will receive a reminder letter in the mail two months in advance. If you don't receive a letter, please call our office to schedule the follow-up appointment.   If you need a refill on your cardiac medications before your next appointment, please call your pharmacy.

## 2017-03-17 ENCOUNTER — Encounter: Payer: Self-pay | Admitting: Cardiology

## 2017-03-17 NOTE — Assessment & Plan Note (Signed)
She clearly has had some episodes of intermittent significant hypertension, but has also had hypotension. Plan for now will be to use as needed dose of atenolol for elevated blood pressures with systolics greater than 431 or diastolic greater than 540. Need to monitor for orthostatic hypotension.

## 2017-03-17 NOTE — Assessment & Plan Note (Signed)
Labile blood pressure.  I am reluctant to be overly aggressive treating this.  I had talked before about switching her to carvedilol, but she would prefer not to.  What we will do for now is have her continue with current dose of lisinopril and atenolol.  But what I have instructed her to do is follow her blood pressures and take an additional 25 mg of atenolol if her blood pressures go above 188 systolic or 677 diastolic.

## 2017-03-17 NOTE — Assessment & Plan Note (Signed)
Interesting that she did not really have any active anginal that this was evaluated.  Both vein grafts occluded probably because of competitive flow. In the absence of active anginal symptoms, I would not do another stress test.  She is not overly excited about the concept of invasive procedures and therefore I would not do screening studies. Continue aspirin beta-blocker and ACE inhibitor. She never did follow-up with [facility] lipid clinic to discuss other options.

## 2017-03-17 NOTE — Assessment & Plan Note (Signed)
Lipids continue to be well under control.  I had referred her to the lipid clinic, but this apparently was lost in translation with her hospitalizations for TIA.  She never did follow-up.  For now we will hold off on making any adjustments and we discussed in 6 months.

## 2017-03-17 NOTE — Assessment & Plan Note (Signed)
Interestingly, she had a non-STEMI with significant disease in the LAD during her initial MI in 2005 that not seen 10 years later. She had a vein graft that went apparently to the ramus intermedius that was occluded, and it is possible that the defect noted on Myoview was related to that.  However this did not seem to fit her distribution. EF now is return to mildly reduced Is on aspirin and Plavix along with beta-blocker and ACE inhibitor. Not on statin due to intolerance.

## 2017-03-27 ENCOUNTER — Other Ambulatory Visit: Payer: Self-pay | Admitting: *Deleted

## 2017-03-27 MED ORDER — CLOPIDOGREL BISULFATE 75 MG PO TABS: 75.0000 mg | ORAL_TABLET | Freq: Every day | ORAL | 10 refills | Status: DC

## 2017-03-27 NOTE — Telephone Encounter (Signed)
REFILL 

## 2017-03-27 NOTE — Telephone Encounter (Signed)
Pharmacy requests a ninety day rx. 

## 2017-04-04 ENCOUNTER — Ambulatory Visit (HOSPITAL_COMMUNITY)
Admission: RE | Admit: 2017-04-04 | Discharge: 2017-04-04 | Disposition: A | Discharge status: Home / Self Care | Payer: Medicare Other | Source: Ambulatory Visit | Attending: Interventional Radiology | Admitting: Interventional Radiology

## 2017-04-04 ENCOUNTER — Encounter (HOSPITAL_COMMUNITY): Payer: Self-pay

## 2017-04-04 DIAGNOSIS — G319 Degenerative disease of nervous system, unspecified: Secondary | ICD-10-CM | POA: Diagnosis not present

## 2017-04-04 DIAGNOSIS — I672 Cerebral atherosclerosis: Secondary | ICD-10-CM | POA: Insufficient documentation

## 2017-04-04 DIAGNOSIS — I671 Cerebral aneurysm, nonruptured: Secondary | ICD-10-CM | POA: Insufficient documentation

## 2017-04-04 DIAGNOSIS — I771 Stricture of artery: Secondary | ICD-10-CM

## 2017-04-04 DIAGNOSIS — Z8673 Personal history of transient ischemic attack (TIA), and cerebral infarction without residual deficits: Secondary | ICD-10-CM | POA: Diagnosis not present

## 2017-04-04 DIAGNOSIS — I729 Aneurysm of unspecified site: Secondary | ICD-10-CM

## 2017-04-04 LAB — CREATININE, SERUM
CREATININE: 0.81 mg/dL (ref 0.44–1.00)
GFR calc Af Amer: >60 mL/min (ref >60)
GFR calc non Af Amer: >60 mL/min (ref >60)

## 2017-04-04 MED ORDER — GADOBENATE DIMEGLUMINE 529 MG/ML IV SOLN
13.0000 mL | Freq: Once | INTRAVENOUS | Status: AC | PRN
  Administered 2017-04-04: 13 mL via INTRAVENOUS

## 2017-04-11 ENCOUNTER — Ambulatory Visit: Payer: Medicare Other | Admitting: Podiatry

## 2017-04-11 DIAGNOSIS — Z1389 Encounter for screening for other disorder: Secondary | ICD-10-CM | POA: Diagnosis not present

## 2017-04-11 DIAGNOSIS — I10 Essential (primary) hypertension: Secondary | ICD-10-CM | POA: Diagnosis not present

## 2017-04-11 DIAGNOSIS — Z1211 Encounter for screening for malignant neoplasm of colon: Secondary | ICD-10-CM | POA: Diagnosis not present

## 2017-04-11 DIAGNOSIS — M25542 Pain in joints of left hand: Secondary | ICD-10-CM | POA: Diagnosis not present

## 2017-04-11 DIAGNOSIS — Z0000 Encounter for general adult medical examination without abnormal findings: Secondary | ICD-10-CM | POA: Diagnosis not present

## 2017-04-11 DIAGNOSIS — E7800 Pure hypercholesterolemia, unspecified: Secondary | ICD-10-CM | POA: Diagnosis not present

## 2017-04-11 DIAGNOSIS — I2510 Atherosclerotic heart disease of native coronary artery without angina pectoris: Secondary | ICD-10-CM | POA: Diagnosis not present

## 2017-04-11 DIAGNOSIS — G459 Transient cerebral ischemic attack, unspecified: Secondary | ICD-10-CM | POA: Diagnosis not present

## 2017-04-16 ENCOUNTER — Other Ambulatory Visit (HOSPITAL_COMMUNITY): Payer: Self-pay | Admitting: Interventional Radiology

## 2017-04-16 DIAGNOSIS — L814 Other melanin hyperpigmentation: Secondary | ICD-10-CM | POA: Diagnosis not present

## 2017-04-16 DIAGNOSIS — Z23 Encounter for immunization: Secondary | ICD-10-CM | POA: Diagnosis not present

## 2017-04-16 DIAGNOSIS — Z85828 Personal history of other malignant neoplasm of skin: Secondary | ICD-10-CM | POA: Diagnosis not present

## 2017-04-16 DIAGNOSIS — D1801 Hemangioma of skin and subcutaneous tissue: Secondary | ICD-10-CM | POA: Diagnosis not present

## 2017-04-16 DIAGNOSIS — I729 Aneurysm of unspecified site: Secondary | ICD-10-CM

## 2017-04-16 DIAGNOSIS — L821 Other seborrheic keratosis: Secondary | ICD-10-CM | POA: Diagnosis not present

## 2017-04-16 DIAGNOSIS — D225 Melanocytic nevi of trunk: Secondary | ICD-10-CM | POA: Diagnosis not present

## 2017-04-16 DIAGNOSIS — L570 Actinic keratosis: Secondary | ICD-10-CM | POA: Diagnosis not present

## 2017-04-16 DIAGNOSIS — Z86018 Personal history of other benign neoplasm: Secondary | ICD-10-CM | POA: Diagnosis not present

## 2017-05-07 ENCOUNTER — Ambulatory Visit (HOSPITAL_COMMUNITY)
Admission: RE | Admit: 2017-05-07 | Discharge: 2017-05-07 | Disposition: A | Discharge status: Home / Self Care | Payer: Medicare Other | Source: Ambulatory Visit | Attending: Interventional Radiology | Admitting: Interventional Radiology

## 2017-05-07 DIAGNOSIS — I671 Cerebral aneurysm, nonruptured: Secondary | ICD-10-CM | POA: Diagnosis not present

## 2017-05-07 DIAGNOSIS — I729 Aneurysm of unspecified site: Secondary | ICD-10-CM

## 2017-05-07 HISTORY — PX: IR RADIOLOGIST EVAL & MGMT: IMG5224

## 2017-05-08 ENCOUNTER — Encounter (HOSPITAL_COMMUNITY): Payer: Self-pay | Admitting: Interventional Radiology

## 2017-07-04 DIAGNOSIS — H401133 Primary open-angle glaucoma, bilateral, severe stage: Secondary | ICD-10-CM | POA: Diagnosis not present

## 2017-07-04 DIAGNOSIS — Z961 Presence of intraocular lens: Secondary | ICD-10-CM | POA: Diagnosis not present

## 2017-07-04 DIAGNOSIS — H43813 Vitreous degeneration, bilateral: Secondary | ICD-10-CM | POA: Diagnosis not present

## 2017-07-06 DIAGNOSIS — H43813 Vitreous degeneration, bilateral: Secondary | ICD-10-CM | POA: Diagnosis not present

## 2017-07-06 DIAGNOSIS — Z961 Presence of intraocular lens: Secondary | ICD-10-CM | POA: Diagnosis not present

## 2017-07-06 DIAGNOSIS — H401133 Primary open-angle glaucoma, bilateral, severe stage: Secondary | ICD-10-CM | POA: Diagnosis not present

## 2017-07-18 DIAGNOSIS — H401133 Primary open-angle glaucoma, bilateral, severe stage: Secondary | ICD-10-CM | POA: Diagnosis not present

## 2017-07-18 DIAGNOSIS — Z961 Presence of intraocular lens: Secondary | ICD-10-CM | POA: Diagnosis not present

## 2017-07-18 DIAGNOSIS — H43813 Vitreous degeneration, bilateral: Secondary | ICD-10-CM | POA: Diagnosis not present

## 2017-08-06 ENCOUNTER — Telehealth: Payer: Self-pay | Admitting: Neurology

## 2017-08-06 ENCOUNTER — Emergency Department (HOSPITAL_COMMUNITY)
Admission: EM | Admit: 2017-08-06 | Discharge: 2017-08-07 | Disposition: A | Discharge status: Home / Self Care | Payer: Medicare Other | Attending: Emergency Medicine | Admitting: Emergency Medicine

## 2017-08-06 ENCOUNTER — Encounter (HOSPITAL_COMMUNITY): Payer: Self-pay | Admitting: *Deleted

## 2017-08-06 DIAGNOSIS — I2510 Atherosclerotic heart disease of native coronary artery without angina pectoris: Secondary | ICD-10-CM | POA: Diagnosis not present

## 2017-08-06 DIAGNOSIS — R413 Other amnesia: Secondary | ICD-10-CM | POA: Insufficient documentation

## 2017-08-06 DIAGNOSIS — R9431 Abnormal electrocardiogram [ECG] [EKG]: Secondary | ICD-10-CM | POA: Diagnosis not present

## 2017-08-06 DIAGNOSIS — Z85828 Personal history of other malignant neoplasm of skin: Secondary | ICD-10-CM | POA: Insufficient documentation

## 2017-08-06 DIAGNOSIS — Z951 Presence of aortocoronary bypass graft: Secondary | ICD-10-CM | POA: Diagnosis not present

## 2017-08-06 DIAGNOSIS — Z7982 Long term (current) use of aspirin: Secondary | ICD-10-CM | POA: Diagnosis not present

## 2017-08-06 DIAGNOSIS — R4182 Altered mental status, unspecified: Secondary | ICD-10-CM | POA: Diagnosis not present

## 2017-08-06 DIAGNOSIS — Z79899 Other long term (current) drug therapy: Secondary | ICD-10-CM | POA: Insufficient documentation

## 2017-08-06 DIAGNOSIS — R410 Disorientation, unspecified: Secondary | ICD-10-CM | POA: Diagnosis not present

## 2017-08-06 DIAGNOSIS — Z7902 Long term (current) use of antithrombotics/antiplatelets: Secondary | ICD-10-CM | POA: Diagnosis not present

## 2017-08-06 DIAGNOSIS — I10 Essential (primary) hypertension: Secondary | ICD-10-CM | POA: Diagnosis not present

## 2017-08-06 LAB — COMPREHENSIVE METABOLIC PANEL
ALBUMIN: 4.1 g/dL (ref 3.5–5.0)
ALK PHOS: 72 U/L (ref 38–126)
ALT: 10 U/L — ABNORMAL LOW (ref 14–54)
AST: 16 U/L (ref 15–41)
Anion gap: 11 (ref 5–15)
BUN: 15 mg/dL (ref 6–20)
CO2: 24 mmol/L (ref 22–32)
Calcium: 10 mg/dL (ref 8.9–10.3)
Chloride: 107 mmol/L (ref 101–111)
Creatinine, Ser: 0.91 mg/dL (ref 0.44–1.00)
GFR calc non Af Amer: 56 mL/min — ABNORMAL LOW (ref >60)
GLUCOSE: 120 mg/dL — AB (ref 65–99)
POTASSIUM: 3.6 mmol/L (ref 3.5–5.1)
SODIUM: 142 mmol/L (ref 135–145)
Total Bilirubin: 0.8 mg/dL (ref 0.3–1.2)
Total Protein: 6.5 g/dL (ref 6.5–8.1)

## 2017-08-06 LAB — CBC
HEMATOCRIT: 39.7 % (ref 36.0–46.0)
HEMOGLOBIN: 13.2 g/dL (ref 12.0–15.0)
MCH: 31.4 pg (ref 26.0–34.0)
MCHC: 33.2 g/dL (ref 30.0–36.0)
MCV: 94.3 fL (ref 78.0–100.0)
Platelets: 212 10*3/uL (ref 150–400)
RBC: 4.21 MIL/uL (ref 3.87–5.11)
RDW: 13.1 % (ref 11.5–15.5)
WBC: 4.8 10*3/uL (ref 4.0–10.5)

## 2017-08-06 LAB — URINALYSIS, ROUTINE W REFLEX MICROSCOPIC
BACTERIA UA: NONE SEEN
Bilirubin Urine: NEGATIVE
Glucose, UA: NEGATIVE mg/dL
KETONES UR: NEGATIVE mg/dL
Leukocytes, UA: NEGATIVE
NITRITE: NEGATIVE
PROTEIN: NEGATIVE mg/dL
Specific Gravity, Urine: 1.011 (ref 1.005–1.030)
pH: 5 (ref 5.0–8.0)

## 2017-08-06 NOTE — ED Triage Notes (Signed)
Pt in c/o episodes of confusion and memory issues that have been worse in the last week, pt alert and oriented at this time, family reports that this has happened in the past and increased in frequency, pt reports decreased mobility and fatigue, no distress noted- alert and oriented, no neuro deficits noted

## 2017-08-06 NOTE — ED Notes (Signed)
Pt. Stated, I think Im having stroke symptoms, started Sat. Night. VAN neg.

## 2017-08-06 NOTE — Telephone Encounter (Signed)
Returned pt's call. She said she is better now. She stated she has 2 aneurysms. She said she was very weak, she could not concentrate, she was playing bridge but could not figure it out even though she has been playing for 50+ years. She stated this lasted about an hour or two. It happened Saturday. This morning she just felt weak and felt the same way as Saturday. RN inquired how pt feels. She said she feels ok. She is holding up. She said she is supposed to be checked in May to see if her aneurysms have grown. Pt denies any arm weakness but says she is still weak in the legs. She says her mind is intact but she has occasions where her mind doesn't work right. ?slurred speech heard by RN on phone but difficult to fully assess as speech was relatively clear and RN had never spoken with pt before as a comparison. RN informed her don't wait for symptoms to resolve. Call 911 to r/o stroke. This was discussed with Dr. Jaynee Eagles who agreed for pt to call 911 and tell them that she is having stroke symptoms. Pt verbalized understanding.

## 2017-08-06 NOTE — Telephone Encounter (Signed)
Patient had a TIA last Saturday and then another this morning and did not go to the ED. She feels fine now but would like a call back to discuss.

## 2017-08-07 ENCOUNTER — Encounter (HOSPITAL_COMMUNITY): Payer: Self-pay | Admitting: Emergency Medicine

## 2017-08-07 ENCOUNTER — Emergency Department (HOSPITAL_COMMUNITY): Payer: Medicare Other

## 2017-08-07 DIAGNOSIS — R413 Other amnesia: Secondary | ICD-10-CM | POA: Diagnosis not present

## 2017-08-07 DIAGNOSIS — R410 Disorientation, unspecified: Secondary | ICD-10-CM | POA: Diagnosis not present

## 2017-08-07 DIAGNOSIS — R4182 Altered mental status, unspecified: Secondary | ICD-10-CM | POA: Diagnosis not present

## 2017-08-07 DIAGNOSIS — R2689 Other abnormalities of gait and mobility: Secondary | ICD-10-CM | POA: Diagnosis not present

## 2017-08-07 NOTE — ED Notes (Signed)
Pt coming to room from xray 

## 2017-08-07 NOTE — ED Notes (Signed)
Patient transported to MRI 

## 2017-08-07 NOTE — ED Notes (Signed)
ED Provider at bedside. 

## 2017-08-07 NOTE — ED Provider Notes (Signed)
[place] EMERGENCY DEPARTMENT Provider Note   CSN: [id number] Arrival date & time: 08/06/17  1728     History   Chief Complaint Chief Complaint  Patient presents with  . Altered Mental Status    HPI [name] is a 84 y.o. female.   Altered Mental Status   This is a chronic (acute component on chronic for > 1 year) problem. The current episode started more than 1 week ago. The problem has been gradually worsening. Associated symptoms include confusion. Pertinent negatives include no seizures, no unresponsiveness, no weakness, no agitation, no delusions and no hallucinations. Risk factors: CVAs in the past  Her past medical history is significant for CVA. Her past medical history does not include diabetes.  Cannot remember things from time to time.  But over the weekend couldn't remember how to play bridge which she plays regularly x 50 years and could not turn on coffee maker.  Has gait abnormality for over a year but is not using her walker or cane per daughter. Sent in for stroke w/u.    Past Medical History:  Diagnosis Date  . Aneurysm ([place])   . Arthritis    osteoarthritis, back, hands, wrists  . Bursitis    hips bilat   . CAD (coronary artery disease), autologous vein bypass graft March 2008   Follow-up cath: December 2015 Occluded SVG-D1 and occluded SVG-RI.  [name] CAD in native artery 2005, 03/2014   a.  Severe disease noted in LAD & RI --> referred for CABG x3; b.  Follow-up cath for abnormal Myoview: Occluded SVG-RI and SVG-D1 with patent LIMA-LAD and competitive flow.  60-70% LAD and RI lesions.  Otherwise minimal disease.  EF 70%.  . Cancer ([place])    + basal cell- on leg  . Dyslipidemia    Statin intolerant  . Glaucoma   . History of kidney stones   . Hypertension   . Non-STEMI (non-ST elevated myocardial infarction) [facility]) October 2005   EF by 35-40%, echo 40-50%. Angiography: 99% mid LAD involving D1 followed by 70% mid LAD; 80% RI. --> CABG    . PONV (postoperative nausea and vomiting)   . S/P CABG x 01 February 2004   LIMA-LAD, SVG-D1, SVG-RI.  [name] Statin intolerance   . Stroke [facility])     Patient Active Problem List   Diagnosis Date Noted  . Hypertensive urgency 10/10/2016  . Hypokalemia 10/10/2016  . Migraine equivalent   . Aneurysm, cerebral, nonruptured   . TIA (transient ischemic attack) 04/05/2016  . Stroke-like symptoms 04/05/2016  . Left-sided weakness 04/05/2016  . Confusion 04/05/2016  . Cerebellar infarct ([place]) 02/16/2016  . Stroke (cerebrum) ([place]) 01/21/2016  . Neurological deficit present 01/21/2016  . Greater trochanteric bursitis of right hip 06/15/2015  . Chest pain of uncertain etiology   . Chest pain 04/26/2014  . Hyperlipidemia LDL goal <70 05/14/2013  . Statin intolerance   . Essential hypertension   . CAD (coronary artery disease), autologous vein bypass graft 06/30/2006  . S/P CABG x 3 01/30/2004  . Non-STEMI (non-ST elevated myocardial infarction) ([place]) 01/30/2004    Past Surgical History:  Procedure Laterality Date  . BUNIONECTOMY     2002  . CARDIOVASCULAR STRESS TEST  05/23/2006   Mild lateral/inferolateral ischemia, likely due to occluded SVGs with exhisting disease.  [name] CAROTID DOPPLER  07/15/2009   Bilat ICAs 0-49% diameter reduction. Normal patency of Bilat subclavian arteries.  . CORONARY ARTERY BYPASS GRAFT  02/22/2004   x3.  LIMA to distal LAD, SVG to first diag, SVG to ramus. SVG harvest from rt thigh.  . EP IMPLANTABLE DEVICE N/A 03/02/2016   Procedure: Loop Recorder Insertion;  Surgeon: Thompson Grayer, MD;  Location: [place] CV LAB;  Service: Cardiovascular;  Laterality: N/A;  . EXCISION/RELEASE BURSA HIP Right 06/15/2015   Procedure: RIGHT HIP BURSECTOMY WITH GLUTEAL TENDON REPAIR;  Surgeon: Gaynelle Arabian, MD;  Location: [facility] OR[facility];  Service: Orthopedics;  Laterality: Right;  . EYE SURGERY     cataract surgery bilat   . IR ANGIO INTRA EXTRACRAN SEL COM CAROTID INNOMINATE BILAT MOD  SED  08/07/2016  . IR ANGIO VERTEBRAL SEL SUBCLAVIAN INNOMINATE BILAT MOD SED  08/07/2016  . IR GENERIC HISTORICAL  06/01/2016   IR RADIOLOGIST EVAL & MGMT 06/01/2016 [facility]-INTERV RAD  . IR RADIOLOGIST EVAL & MGMT  05/07/2017  . LEFT HEART CATHETERIZATION WITH CORONARY ANGIOGRAM  02/19/2004   Significant 2 vessel CAD - LAD, D1 and RI  . LEFT HEART CATHETERIZATION WITH CORONARY/GRAFT ANGIOGRAM N/A 04/28/2014   Procedure: LEFT HEART CATHETERIZATION WITH Beatrix Fetters;  Surgeon: Sinclair Grooms, MD;  Location: [facility] CATH LAB: CTO of SVG-Diag & SVG-RI, patent LIMA-LAD. Patent native circumflex, LAD and RCA. 70% stenosis in a branch of RI. --> Does not explain "high risk perfusion study "  . LEFT HEART CATHETERIZATION WITH CORONARY/GRAFT ANGIOGRAM   07/25/2006   Totally occluded SVG to diag and ramus. Patent LIMA-LAD. Ramus 70-80% proximal stenosis and occluded vein graft.  [name] left total knee replacement      2001  . NM MYOVIEW LTD  January 2008; December 2015   a. Referred for For mild inferolateral and anterolateral/apical lateral defect with mild reversibility.;; b. Large defect in the anterior and inferior wall. Suggestive of potential infarct plus ischemia. HIGH RISK.  [name] RADIOLOGY WITH ANESTHESIA N/A 08/07/2016   Procedure: EMBOLIZATION;  Surgeon: Luanne Bras, MD;  Location: [place];  Service: Radiology;  Laterality: N/A;  . right total knee replacement      2003  . TRANSTHORACIC ECHOCARDIOGRAM  02/19/2004; December 2015   a. EF 45-50%, Normal LV function, moderate hypokinesis of anterior wall.;; b. EF 50-55%. No RWMA, GR 1 DD. Normal valves      OB History   None      Home Medications    Prior to Admission medications   Medication Sig Start Date End Date Taking? Authorizing Provider  acetaminophen (TYLENOL) 500 MG tablet Take 500 mg by mouth every 6 (six) hours as needed (for pain).    Yes [provider]  aspirin EC 81 MG tablet Take 81 mg by mouth at bedtime.    Yes  [provider]  atenolol (TENORMIN) 25 MG tablet Take 1 tablet (25 mg total) daily by mouth. May take an additional 25 mg of medication daily, if SBP> 160 or DBP >100 Patient taking differently: Take 25 mg by mouth at bedtime. May take an additional 25 mg of medication daily, if SBP> 160 or DBP >100 03/14/17  Yes Leonie Man, MD  brimonidine (ALPHAGAN P) 0.1 % SOLN Place 1 drop into both eyes 2 (two) times daily.    Yes [provider]  brinzolamide (AZOPT) 1 % ophthalmic suspension Place 1 drop into both eyes 2 (two) times daily.   Yes [provider]  Cholecalciferol (VITAMIN D3) 2000 UNITS capsule Take 2,000 Units by mouth at bedtime.    Yes [provider]  clopidogrel (PLAVIX) 75 MG tablet Take 1 tablet (  75 mg total) by mouth daily. Patient taking differently: Take 75 mg by mouth at bedtime.  03/27/17  Yes Leonie Man, MD  latanoprost (XALATAN) 0.005 % ophthalmic solution Place 1 drop into both eyes at bedtime. 06/26/16  Yes [provider]  lisinopril (PRINIVIL,ZESTRIL) 20 MG tablet Take 20 mg by mouth at bedtime.    Yes [provider]  timolol (BETIMOL) 0.5 % ophthalmic solution Place 1 drop into both eyes 2 (two) times daily.   Yes [provider]    Family History Family History  Problem Relation Age of Onset  . Alzheimer's disease Mother   . Alzheimer's disease Sister   . Hyperlipidemia Sister   . Hypertension Sister   . Diabetes Sister   . Hyperlipidemia Sister   . Hypertension Sister   . Pancreatitis Child     Social History Social History   Tobacco Use  . Smoking status: Never Smoker  . Smokeless tobacco: Never Used  Substance Use Topics  . Alcohol use: Yes    Alcohol/week: 0.5 oz    Types: 1 Standard drinks or equivalent per week    Comment: 1 glass of liquor weekly   . Drug use: No     Allergies   Nsaids; Statins; Tricor [fenofibrate]; and Zetia [ezetimibe]   Review of Systems Review  of Systems  Constitutional: Negative for diaphoresis, fatigue and fever.  HENT: Negative for trouble swallowing and voice change.   Eyes: Negative for visual disturbance.  Respiratory: Negative for shortness of breath.   Cardiovascular: Negative for chest pain, palpitations and leg swelling.  Genitourinary: Negative for dysuria and flank pain.  Musculoskeletal: Positive for gait problem.  Neurological: Positive for tremors. Negative for dizziness, seizures, syncope, facial asymmetry, speech difficulty, weakness, light-headedness, numbness and headaches.  Psychiatric/Behavioral: Positive for confusion. Negative for agitation and hallucinations.  All other systems reviewed and are negative.    Physical Exam Updated Vital Signs BP (!) 162/84   Pulse 84   Temp 97.8 F (36.6 C)   Resp 16   SpO2 94%   Physical Exam  Constitutional: She appears well-developed and well-nourished. No distress.  HENT:  Head: Normocephalic and atraumatic.  Mouth/Throat: No oropharyngeal exudate.  Eyes: Pupils are equal, round, and reactive to light. Conjunctivae are normal.  Neck: Normal range of motion. Neck supple.  Cardiovascular: Normal rate, regular rhythm, normal heart sounds and intact distal pulses.  Pulmonary/Chest: Effort normal and breath sounds normal. No stridor. She has no wheezes.  Abdominal: Soft. Bowel sounds are normal. She exhibits no mass. There is no tenderness. There is no rebound and no guarding.  Musculoskeletal: Normal range of motion.  Neurological: She is alert. She displays normal reflexes. No cranial nerve deficit or sensory deficit. She exhibits normal muscle tone.  Memory impairment   Skin: Skin is warm and dry. Capillary refill takes less than 2 seconds.  Psychiatric: She has a normal mood and affect.     ED Treatments / Results  Labs (all labs ordered are listed, but only abnormal results are displayed)  Results for orders placed or performed during the hospital  encounter of 08/06/17  Comprehensive metabolic panel  Result Value Ref Range   Sodium 142 135 - 145 mmol/L   Potassium 3.6 3.5 - 5.1 mmol/L   Chloride 107 101 - 111 mmol/L   CO2 24 22 - 32 mmol/L   Glucose, Bld 120 (H) 65 - 99 mg/dL   BUN 15 6 - 20 mg/dL   Creatinine, Ser  0.91 0.44 - 1.00 mg/dL   Calcium 10.0 8.9 - 10.3 mg/dL   Total Protein 6.5 6.5 - 8.1 g/dL   Albumin 4.1 3.5 - 5.0 g/dL   AST 16 15 - 41 U/L   ALT 10 (L) 14 - 54 U/L   Alkaline Phosphatase 72 38 - 126 U/L   Total Bilirubin 0.8 0.3 - 1.2 mg/dL   GFR calc non Af Amer 56 (L) >60 mL/min   GFR calc Af Amer >60 >60 mL/min   Anion gap 11 5 - 15  CBC  Result Value Ref Range   WBC 4.8 4.0 - 10.5 K/uL   RBC 4.21 3.87 - 5.11 MIL/uL   Hemoglobin 13.2 12.0 - 15.0 g/dL   HCT 39.7 36.0 - 46.0 %   MCV 94.3 78.0 - 100.0 fL   MCH 31.4 26.0 - 34.0 pg   MCHC 33.2 30.0 - 36.0 g/dL   RDW 13.1 11.5 - 15.5 %   Platelets 212 150 - 400 K/uL  Urinalysis, Routine w reflex microscopic  Result Value Ref Range   Color, Urine YELLOW YELLOW   APPearance CLEAR CLEAR   Specific Gravity, Urine 1.011 1.005 - 1.030   pH 5.0 5.0 - 8.0   Glucose, UA NEGATIVE NEGATIVE mg/dL   Hgb urine dipstick SMALL (A) NEGATIVE   Bilirubin Urine NEGATIVE NEGATIVE   Ketones, ur NEGATIVE NEGATIVE mg/dL   Protein, ur NEGATIVE NEGATIVE mg/dL   Nitrite NEGATIVE NEGATIVE   Leukocytes, UA NEGATIVE NEGATIVE   RBC / HPF 0-5 0 - 5 RBC/hpf   WBC, UA 0-5 0 - 5 WBC/hpf   Bacteria, UA NONE SEEN NONE SEEN   Squamous Epithelial / LPF 0-5 (A) NONE SEEN   Mucus PRESENT    Dg Chest 2 View  Result Date: 08/07/2017 CLINICAL DATA:  Altered mental status. EXAM: CHEST - 2 VIEW COMPARISON:  Radiographs 10/09/2016 FINDINGS: Patient is rotated to the left. Post median sternotomy. Unchanged heart size and mediastinal contours. No pulmonary edema or focal airspace disease. No pleural effusion. No pneumothorax. No acute osseous abnormalities. Implanted loop recorder projects over the  left chest wall. IMPRESSION: No active cardiopulmonary disease. Electronically Signed   By: Jeb Levering M.D.   On: 08/07/2017 00:47   Ct Head Wo Contrast  Result Date: 08/07/2017 CLINICAL DATA:  Confusion and memory loss. EXAM: CT HEAD WITHOUT CONTRAST TECHNIQUE: Contiguous axial images were obtained from the base of the skull through the vertex without intravenous contrast. COMPARISON:  04/05/2016 head CT FINDINGS: Brain: Chronic bilateral cerebellar infarcts. No significant hippocampal atrophy. No acute intracranial hemorrhage, midline shift or edema. Chronic mild-to-moderate small vessel ischemia of periventricular and subcortical white matter. No large vascular territory infarct. No acute intracranial hemorrhage, midline shift or edema. No hydrocephalus. No intra-axial mass nor extra-axial fluid collections. Vascular: No hyperdense vessel sign. Skull: No acute osseous abnormality. Sinuses/Orbits: Bilateral cataract extraction. Intact orbits and globes. Clear paranasal sinuses and mastoids. Other: None IMPRESSION: Chronic small vessel ischemia and bilateral cerebellar infarcts. No acute intracranial abnormality. No significant hippocampal volume loss. Electronically Signed   By: Ashley Royalty M.D.   On: 08/07/2017 01:03   Mr Brain Wo Contrast  Result Date: 08/07/2017 CLINICAL DATA:  84 y/o F; worsening confusion and memory issues with increased frequency. EXAM: MRI HEAD WITHOUT CONTRAST TECHNIQUE: Multiplanar, multiecho pulse sequences of the brain and surrounding structures were obtained without intravenous contrast. COMPARISON:  08/07/2016 CT head.  04/04/2017 MRI head. FINDINGS: Brain: No acute infarction, hemorrhage, hydrocephalus, extra-axial collection  or mass lesion. Multiple stable very small chronic infarctions within the cerebellar hemispheres. Stable small chronic lacunar infarct within the right midbrain. Severalnonspecific foci of T2 FLAIR hyperintense signal abnormality in subcortical and  periventricular white matter are compatible withmoderatechronic microvascular ischemic changes for age. Moderatebrain parenchymal volume loss. Two punctate foci of susceptibility hypointensity are present in right periatrial white matter compatible hemosiderin deposition of chronic microhemorrhage. Vascular: Normal flow voids. Skull and upper cervical spine: Normal marrow signal. Sinuses/Orbits: Negative. Other: None. IMPRESSION: 1. No acute intracranial abnormality identified. 2. Stable small chronic infarcts within right midbrain and bilateral cerebellar hemispheres. 3. Stable moderate chronic microvascular ischemic changes and parenchymal volume loss of the brain. Electronically Signed   By: Kristine Garbe M.D.   On: 08/07/2017 03:54    EKG EKG Interpretation  Date/Time:  Monday Bich Mchaney 08 2019 17:39:48 EDT Ventricular Rate:  76 PR Interval:  202 QRS Duration: 76 QT Interval:  384 QTC Calculation: 432 R Axis:   52 Text Interpretation:  Normal sinus rhythm Nonspecific T wave abnormality Confirmed by Dory Horn) on 08/06/2017 11:52:50 PM   Radiology Dg Chest 2 View  Result Date: 08/07/2017 CLINICAL DATA:  Altered mental status. EXAM: CHEST - 2 VIEW COMPARISON:  Radiographs 10/09/2016 FINDINGS: Patient is rotated to the left. Post median sternotomy. Unchanged heart size and mediastinal contours. No pulmonary edema or focal airspace disease. No pleural effusion. No pneumothorax. No acute osseous abnormalities. Implanted loop recorder projects over the left chest wall. IMPRESSION: No active cardiopulmonary disease. Electronically Signed   By: Jeb Levering M.D.   On: 08/07/2017 00:47   Ct Head Wo Contrast  Result Date: 08/07/2017 CLINICAL DATA:  Confusion and memory loss. EXAM: CT HEAD WITHOUT CONTRAST TECHNIQUE: Contiguous axial images were obtained from the base of the skull through the vertex without intravenous contrast. COMPARISON:  04/05/2016 head CT FINDINGS: Brain:  Chronic bilateral cerebellar infarcts. No significant hippocampal atrophy. No acute intracranial hemorrhage, midline shift or edema. Chronic mild-to-moderate small vessel ischemia of periventricular and subcortical white matter. No large vascular territory infarct. No acute intracranial hemorrhage, midline shift or edema. No hydrocephalus. No intra-axial mass nor extra-axial fluid collections. Vascular: No hyperdense vessel sign. Skull: No acute osseous abnormality. Sinuses/Orbits: Bilateral cataract extraction. Intact orbits and globes. Clear paranasal sinuses and mastoids. Other: None IMPRESSION: Chronic small vessel ischemia and bilateral cerebellar infarcts. No acute intracranial abnormality. No significant hippocampal volume loss. Electronically Signed   By: Ashley Royalty M.D.   On: 08/07/2017 01:03   Mr Brain Wo Contrast  Result Date: 08/07/2017 CLINICAL DATA:  84 y/o F; worsening confusion and memory issues with increased frequency. EXAM: MRI HEAD WITHOUT CONTRAST TECHNIQUE: Multiplanar, multiecho pulse sequences of the brain and surrounding structures were obtained without intravenous contrast. COMPARISON:  08/07/2016 CT head.  04/04/2017 MRI head. FINDINGS: Brain: No acute infarction, hemorrhage, hydrocephalus, extra-axial collection or mass lesion. Multiple stable very small chronic infarctions within the cerebellar hemispheres. Stable small chronic lacunar infarct within the right midbrain. Severalnonspecific foci of T2 FLAIR hyperintense signal abnormality in subcortical and periventricular white matter are compatible withmoderatechronic microvascular ischemic changes for age. Moderatebrain parenchymal volume loss. Two punctate foci of susceptibility hypointensity are present in right periatrial white matter compatible hemosiderin deposition of chronic microhemorrhage. Vascular: Normal flow voids. Skull and upper cervical spine: Normal marrow signal. Sinuses/Orbits: Negative. Other: None. IMPRESSION: 1.  No acute intracranial abnormality identified. 2. Stable small chronic infarcts within right midbrain and bilateral cerebellar hemispheres. 3. Stable moderate chronic microvascular ischemic  changes and parenchymal volume loss of the brain. Electronically Signed   By: Kristine Garbe M.D.   On: 08/07/2017 03:54    Procedures Procedures (including critical care time)  Medications Ordered in ED Medications - No data to display   Case d/w Dr. Lorraine Lax of neuro.  If MRI is negative for acute or subacute CVA this patient's condition is consistent with dementia of undetermined Etiology and needs follow up for further testing and pt ot.     Final Clinical Impressions(s) / ED Diagnoses   Final diagnoses:  Memory loss    Symptoms are classic for dementia.  While I cannot say why things were worse this weekend.  I have advised the patient to follow up with PMD and their neurologist for neuropsychiatric testing and medication therapy.  Both patient and her daughter are grateful and amenable to this plan.  Patient has been advised to use her walker so she does not fall and injury herself.    Return for weakness, numbness, changes in vision or speech, fevers >100.4 unrelieved by medication, shortness of breath, intractable vomiting, or diarrhea, abdominal pain, Inability to tolerate liquids or food, cough, altered mental status or any concerns. No signs of systemic illness or infection. The patient is nontoxic-appearing on exam and vital signs are within normal limits.   I have reviewed the triage vital signs and the nursing notes. Pertinent labs &imaging results that were available during my care of the patient were reviewed by me and considered in my medical decision making (see chart for details).  After history, exam, and medical workup I feel the patient has been appropriately medically screened and is safe for discharge home. Pertinent diagnoses were discussed with the patient. Patient  was given return precautions.   Beauford Lando, MD 08/07/17 [phone]

## 2017-08-07 NOTE — ED Notes (Signed)
Pt ambulated with walker, gait steady.

## 2017-08-15 DIAGNOSIS — Z961 Presence of intraocular lens: Secondary | ICD-10-CM | POA: Diagnosis not present

## 2017-08-15 DIAGNOSIS — H43813 Vitreous degeneration, bilateral: Secondary | ICD-10-CM | POA: Diagnosis not present

## 2017-08-15 DIAGNOSIS — H401133 Primary open-angle glaucoma, bilateral, severe stage: Secondary | ICD-10-CM | POA: Diagnosis not present

## 2017-09-12 ENCOUNTER — Ambulatory Visit: Payer: Medicare Other | Admitting: Neurology

## 2017-10-15 ENCOUNTER — Telehealth: Payer: Self-pay | Admitting: Cardiology

## 2017-10-15 NOTE — Telephone Encounter (Signed)
  Returned call to patient and her granddaughter answered. She is calling on behalf of patient. She was supposed to have dental extraction in July but her dentist office [facility]) r/s her for today. She is wanting to know if patient can have extraction on plavix. Explained that it is preferred, from a cardiology standpoint, that she not stop her antiplatelets and if the dentist is willing to do the extraction in ASA & plavix it is their decision. Advised that she contact dentist office to make sure they are aware of all the medications patient is on - especially ASA & plavix - to confirm they can proceed with extraction today. Informed her that risk of bleeding will be greater if she is on meds during extraction. If dentist will not proceed, she should call us back so parameters for holding medications can be provided.

## 2017-10-15 NOTE — Telephone Encounter (Signed)
New Message:       Pt c/o medication issue:  1. Name of Medication: clopidogrel (PLAVIX) 75 MG tablet  2. How are you currently taking this medication (dosage and times per day)?Take 1 tablet (75 mg total) by mouth daily. Patient taking differently: Take 75 mg by mouth at bedtime.    3. Are you having a reaction (difficulty breathing--STAT)? No  4. What is your medication issue? Pt is calling and states she will be having a tooth pulled in July. Pt states she would like to know is it okay to be on this medication and have this done.

## 2017-10-23 DIAGNOSIS — E7800 Pure hypercholesterolemia, unspecified: Secondary | ICD-10-CM | POA: Diagnosis not present

## 2017-10-23 DIAGNOSIS — I10 Essential (primary) hypertension: Secondary | ICD-10-CM | POA: Diagnosis not present

## 2017-10-23 DIAGNOSIS — I2510 Atherosclerotic heart disease of native coronary artery without angina pectoris: Secondary | ICD-10-CM | POA: Diagnosis not present

## 2017-11-14 DIAGNOSIS — Z961 Presence of intraocular lens: Secondary | ICD-10-CM | POA: Diagnosis not present

## 2017-11-14 DIAGNOSIS — H43813 Vitreous degeneration, bilateral: Secondary | ICD-10-CM | POA: Diagnosis not present

## 2017-11-14 DIAGNOSIS — H401133 Primary open-angle glaucoma, bilateral, severe stage: Secondary | ICD-10-CM | POA: Diagnosis not present

## 2017-11-20 ENCOUNTER — Encounter

## 2017-11-20 ENCOUNTER — Encounter: Payer: Self-pay | Admitting: *Deleted

## 2017-11-20 ENCOUNTER — Ambulatory Visit (INDEPENDENT_AMBULATORY_CARE_PROVIDER_SITE_OTHER): Payer: Medicare Other | Admitting: Neurology

## 2017-11-20 ENCOUNTER — Encounter: Payer: Self-pay | Admitting: Psychology

## 2017-11-20 ENCOUNTER — Encounter: Payer: Self-pay | Admitting: Neurology

## 2017-11-20 VITALS — BP 192/89 | HR 64 | Ht 66.0 in | Wt 123.0 lb

## 2017-11-20 DIAGNOSIS — E538 Deficiency of other specified B group vitamins: Secondary | ICD-10-CM

## 2017-11-20 DIAGNOSIS — G459 Transient cerebral ischemic attack, unspecified: Secondary | ICD-10-CM | POA: Diagnosis not present

## 2017-11-20 DIAGNOSIS — I639 Cerebral infarction, unspecified: Secondary | ICD-10-CM | POA: Diagnosis not present

## 2017-11-20 DIAGNOSIS — R413 Other amnesia: Secondary | ICD-10-CM

## 2017-11-20 DIAGNOSIS — I671 Cerebral aneurysm, nonruptured: Secondary | ICD-10-CM

## 2017-11-20 NOTE — Patient Instructions (Addendum)
Repeat images of the blood vessels Labs today Other treatment include starting Donepezil (Aricept), FDG PET Scan for dementia MIND Diet out of [facility]         Formal memory testing ordered Dr. Si Raider  Recommendations to prevent or slow progression of cognitive decline:   Exercise You should increase exercise 30 to 45 minutes per day at least 3 days a week although 5 to 7 would be preferred. Any type of exercise (including walking) is acceptable although a recumbent bicycle may be best if you are unsteady. Disease related apathy can be a significant roadblock to exercise and the only way to overcome this is to make it a daily routine and perhaps have a reward at the end (something your loved one loves to eat or drink perhaps) or a personal trainer coming to the home can also be very useful. In general a structured, repetitive schedule is best.   Cardiovascular Health: You should optimize all cardiovascular risk factors (blood pressure, sugar, cholesterol) as vascular disease such as strokes and heart attacks can make memory problems much worse.   Diet: Eating a heart healthy (Mediterranean) diet is also a good idea; fish and poultry instead of red meat, nuts (mostly non-peanuts), vegetables, fruits, olive oil or canola oil (instead of butter), minimal salt (use other spices to flavor foods), whole grain rice, bread, cereal and pasta and wine in moderation.  General Health: Any diseases which effect your body will effect your brain such as a pneumonia, urinary infection, blood clot, heart attack or stroke. Keep contact with your primary care doctor for regular follow ups.  Sleep. A good nights sleep is healthy for the brain. Seven hours is recommended. If you have insomnia or poor sleep habits see the recommendations below  Tips: Structured and consistent daytime and nighttime routine, including regular wake times, bedtimes, and mealtimes, will be important for the patient to avoid confusion.  Keeping frequently used items in designated places will help reduce stress from searching. If there are worries about getting lost do not let the patient leave home unaccompanied. They might benefit from wearing an identification bracelet that will help others assist in finding home if they become lost. Information about nationwide safe return services and other helpful resources may be obtained through the Alzheimer's Association helpline at [phone].  Finances, Power of Producer, television/film/video Directives: You should consider putting legal safeguards in place with regard to financial and medical decision making. While the spouse always has power of attorney for medical and financial issues in the absence of any form, you should consider what you want in case the spouse / caregiver is no longer around or capable of making decisions.   [place] : http://www.welch.com/.pdf  Or Google "[place]" AND "An Forensic scientist for [employer]  Other States: ApartmentMom.com.ee  The signature on these forms should be notarized.   If you would like to be tested to see if you are driving safely, [facility] has a Clinical Driving Evaluation. To schedule an appointment call [phone].                RESOURCES:  Memory Loss: Improve your short term memory By Silvio Pate  The Alzheimer's Reading Room http://[email or web address]   The Alzheimer's Compendium http://www.alzcompend.info/  [employer] www.dukefamilysupport.SFK 5700586889  Recommended resources for caregivers (All can be purchased on [employer]):  1) A Caregiver's Guide to Dementia: Using Activities and Other Strategies to Prevent, Reduce and Manage Behavioral Symptoms by Osie Bond. [place] and  Barnetta Chapel Verrier Piersol   2) A Caregiver's Guide to [employer] Dementia by Caleen Essex MS BSN and Gaston Islam   3) What If It's Not Alzheimer's?: A Caregiver's Guide to Dementia by Koren Shiver (Author), Octaviano Batty (Editor)  3) The 36 hour day by Rabins and Mace  4) Understanding Difficult Behaviors by Merita Norton and White  Online course for helping caregivers reduce stress, guilt and frustration called the Caregivers Helpbook. The website is www.powerfultoolsforcaregivers.org  As a caregiver you are a Art gallery manager. Problems you face as a caregiver are usually unique to your situation and the way your loved-one's disease manifests itself. The best way to use these books is to look at the Table of Contents and read any chapters of interest or that apply to challenges you are having as a caregiver.  NATIONAL RESOURCES: For more information on neurological disorders or research programs funded by the [employer] of Neurological Disorders and Stroke, contact the Institute's Agricultural consultant (BRAIN) at: [place] [address] [phone] (toll-free) MasterBoxes.it  Information on dementia is also available from the following organizations: Alzheimer's Disease Education and Referral ([place]) [place] on A[facility] [address] [place] [address] [phone] (toll-free) DVDEnthusiasts.nl  Alzheimer's Association [address] [place] [address] [phone] (toll-free, 24-hour helpline) [phone] (TDD) CapitalMile.co.nz  Alzheimer's Foundation of America [address], [place] [address] [phone] (toll-free) www.alzfdn.org  [facility] [address] [place] [address] [phone] www.alzdiscovery.org  Association for [place] [address], [place] [address] [phone]  (toll-free) www.[place] [address] [phone] (toll-free) www.brightfocus.org/alzheimers  Doran Stabler [facility] Alzheimer's [facility] [address] [place] [address] [phone] www.https://lambert-jackson.net/  Lewy Body Dementia Association [address], [place] [address] [phone] (toll-free LBD Caregiver Link) www.lbda.[place], [place], [place] [address] [phone] (toll-free) [phone] [facility]) https://carter.com/  National Organization for Rare Disorders [address] [place] [address] 3-888-280-KLKJ [phone]) (toll-free) www.rarediseases.org  The Dementias: Hope Through Research was jointly produced by the [employer] of Neurological Disorders and Stroke (NINDS) and the [employer] on Aging (NIA), both part of the [employer], the [employer] research agency-supporting scientific studies that turn discovery into health. NINDS is the nation's leading funder of research on the brain and nervous system. The NINDS mission is to reduce the burden of neurological disease. For more information and resources, visit MasterBoxes.it [1] or call [phone]. NIA leads the federal government effort conducting and supporting research on aging and the health and well-being of older people. NIA's Alzheimer's Disease Education and Referral (ADEAR) Center offers information and publications on dementia and caregiving for families, caregivers, and professionals. For more information, visit DVDEnthusiasts.nl [2] or call [phone]. Also available from NIA are publications and information about Alzheimer's disease as well as the booklets Frontotemporal Disorders: Information for Patients, Families, and Caregivers and Lewy Body Dementia: Information for  Patients, Families, and Professionals. Source URL: SocialSpecialists.co.nz     Vitamin B12 Deficiency Vitamin B12 deficiency occurs when the body does not have enough vitamin B12. Vitamin B12 is an important vitamin. The body needs vitamin B12:  To make red blood cells.  To make DNA. This is the genetic material inside cells.  To help the nerves  work properly so they can carry messages from the brain to the body.  Vitamin B12 deficiency can cause various health problems, such as a low red blood cell count (anemia) or nerve damage. What are the causes? This condition may be caused by:  Not eating enough foods that contain vitamin B12.  Not having enough stomach acid and digestive fluids to properly absorb vitamin B12 from the food that you eat.  Certain digestive system diseases that make it hard to absorb vitamin B12. These diseases include Crohn disease, chronic pancreatitis, and cystic fibrosis.  Pernicious anemia. This is a condition in which the body does not make enough of a protein (intrinsic factor), resulting in too few red blood cells.  Having a surgery in which part of the stomach or small intestine is removed.  Taking certain medicines that make it hard for the body to absorb vitamin B12. These medicines include: ? Heartburn medicine (antacids and proton pump inhibitors). ? An antibiotic medicine called neomycin. ? Some medicines that are used to treat diabetes, tuberculosis, gout, or high cholesterol.  What increases the risk? The following factors may make you more likely to develop a B12 deficiency:  Being older than age 64.  Eating a vegetarian or vegan diet, especially while you are pregnant.  Eating a poor diet while you are pregnant.  Taking certain drugs.  Having alcoholism.  What are the signs or symptoms? In some cases, there are no symptoms of this condition. If the condition leads to anemia or nerve  damage, various symptoms can occur, such as:  Weakness.  Fatigue.  Loss of appetite.  Weight loss.  Numbness or tingling in your hands and feet.  Redness and burning of the tongue.  Confusion or memory problems.  Depression.  Sensory problems, such as color blindness, ringing in the ears, or loss of taste.  Diarrhea or constipation.  Trouble walking.  If anemia is severe, symptoms can include:  Shortness of breath.  Dizziness.  Rapid heart rate (tachycardia).  How is this diagnosed? This condition may be diagnosed with a blood test to measure the level of vitamin B12 in your blood. You may have other tests to help find the cause of your vitamin B12 deficiency. These tests may include:  A complete blood count (CBC). This is a group of tests that measure certain characteristics of blood cells.  A blood test to measure intrinsic factor.  An endoscopy. In this procedure, a thin tube with a camera on the end is used to look into your stomach or intestines.  How is this treated? Treatment for this condition depends on the cause. Common treatment options include:  Changing your eating and drinking habits, such as: ? Eating more foods that contain vitamin B12. ? Drinking less alcohol or no alcohol.  Taking vitamin B12 supplements. Your health care provider will tell you which dosage is best for you.  Getting vitamin B12 injections.  Follow these instructions at home:  Take supplements only as told by your health care provider. Follow the directions carefully.  Get any injections that are prescribed by your health care provider.  Do not miss your appointments.  Eat lots of healthy foods that contain vitamin B12. Ask your health care provider if you should work with a dietitian. Foods that contain vitamin B12 include: ? Meat. ? Meat from birds (poultry). ? Fish. ? Eggs. ? Cereal and dairy products that are fortified. This means that vitamin B12 has been added to  the food.  Check the label on the package to see if the food is fortified.  Do not abuse alcohol.  Keep all follow-up visits as told by your health care provider. This is important. Contact a health care provider if:  Your symptoms come back. Get help right away if:  You develop shortness of breath.  You have chest pain.  You become dizzy or you lose consciousness. This information is not intended to replace advice given to you by your health care provider. Make sure you discuss any questions you have with your health care provider. Document Released: 07/10/2011 Document Revised: 09/29/2015 Document Reviewed: 09/02/2014 Elsevier Interactive Patient Education  2018 [place] Disease Alzheimer disease is a brain disease that affects memory, thinking, and behavior. People with Alzheimer disease lose mental abilities, and the disease gets worse over time. Survival with Alzheimer disease ranges from several years to as long as 20 years. What are the causes? This condition develops when a protein called beta-amyloid forms deposits in the brain. It is not known what causes these deposits to form. What increases the risk? This condition is more likely to develop in people who:  Are elderly.  Have a family history of dementia.  Have had a brain injury.  Have heart or blood vessel disease.  Have had a stroke.  Have high blood pressure or high cholesterol.  Have diabetes.  What are the signs or symptoms? Symptoms of this condition happen in three stages, which often overlap. Early stage In this stage, you may continue to be independent. You may still be able to drive, work, and be social. Symptoms in this stage include:  Minor memory problems, such as forgetting a name or what you read.  Difficulty with: ? Paying attention. ? Communicating. ? Doing familiar tasks. ? Learning new things.  Needing more time to do daily activities.  Anxiety.  Social  withdrawal.  Loss of motivation.  Moderate stage In this stage, you will start to need care. This stage usually lasts the longest. Symptoms in this stage include:  Difficulty with expressing thoughts.  Memory loss that affects daily life. This can include forgetting: ? Your address or phone number. ? Events that have happened. ? Parts of your personal history, like where you went to school.  Confusion about where you are or what time it is.  Difficulty in judging distance.  Changes in personality, mood, and behavior. You may be moody, irritable, angry, frustrated, fearful, anxious, or suspicious.  Poor reasoning and judgment.  Delusions or hallucinations.  Changes in sleep patterns.  Wandering and getting lost.  Severe stage In the final stage, you will need help with your personal care and dailyactivities. Symptoms in this stage include:  Worsening memory loss.  Personality changes.  Loss of awareness of your surroundings.  Changes in physical abilities, including the ability to walk, sit, and swallow.  Difficulty in communicating.  Inability to control the bladder and bowels.  Increasing confusion.  Increasing disruptive behavior.  How is this diagnosed? This condition is diagnosed with an assessment by your health care provider. During this assessment, your health care provider will talk with you and your family, friends, or caregivers about your symptoms. A thorough medical history will be taken, and you will have a physical exam and tests. Tests may include:  Lab tests, such as blood or urine tests.  Imaging tests, such as a CT scan, PET scan, or MRI.  A lumbar puncture. This test involves removing and testing a small  amount of the fluid that surrounds the brain and spinal cord.  An electroencephalogram (EEG). In this test, small metal discs are used to measure electrical activity in the brain.  Memory tests, cognitive tests, and neuropsychological  tests. These tests evaluate brain function.  How is this treated? At this time, there is no treatment to cure Alzheimer disease or stop it from getting worse. The goals of treatment are:  To slow down the disease.  To manage behavioral problems.  To provide you with a safe environment.  To make life easier for you and your caregivers.  The following treatment options are available:  Medicines. Medicines may help to slow down memory loss and control behavioral symptoms.  Talk therapy. Talk therapy provides you with education, support, and memory aids. It is most helpful in the early stages of the condition.  Counseling or spiritual guidance. It is normal to have a lot of feelings, including anger, relief, fear, and isolation. Counseling and guidance can help you deal with these feelings.  Caregiving. This involves having caregivers help you with your daily activities. Caregivers may be family members, friends, or trained medical professionals. Caregiving can be done at home or outside the home.  Family support groups. These provide education, emotional support, and information about community resources to family members who are taking care of you.  Follow these instructions at home: Medicines  Take over-the-counter and prescription medicines only as told by your health care provider.  Avoid taking medicines that can affect thinking, such as pain or sleeping medicines. Lifestyle   Make healthy lifestyle choices: ? Be physically active as told by your health care provider. ? Do not use any tobacco products, such as cigarettes, chewing tobacco, and e-cigarettes. If you need help quitting, ask your health care provider. ? Eat a healthy diet. ? Practice stress-management techniques when you get stressed. ? Stay social.  Drink enough fluid to keep your urine clear or pale yellow.  Make sure to get quality sleep. These tips can help you get a good night's rest: ? Avoid napping  during the day. ? Keep your sleeping area dark and cool. ? Avoid exercising during the few hours before you go to bed. ? Avoid caffeine products in the evening. General instructions  Work with your health care provider to determine what you need help with and what your safety needs are.  If you were given a bracelet that tracks your location, make sure to wear it.  Keep all follow-up visits as told by your health care provider. This is important.  If you have questions or would like additional support, you may contact The Alzheimer's Association: ? 24-hour helpline: [phone] ? Website: CapitalMile.co.nz Contact a health care provider if:  You have nausea, vomiting, or trouble with eating.  You have dizziness, or weakness.  You have new or worsening trouble with sleeping.  You or your family members become concerned for your safety. Get help right away if:  You develop chest pain or difficulty with breathing.  You pass out. This information is not intended to replace advice given to you by your health care provider. Make sure you discuss any questions you have with your health care provider. Document Released: 12/28/2003 Document Revised: 12/17/2015 Document Reviewed: 01/13/2015 Elsevier Interactive Patient Education  2018 [employer].   Donepezil tablets What is this medicine? DONEPEZIL (doe NEP e zil) is used to treat mild to moderate dementia caused by Alzheimer's disease. This medicine may be used for other purposes;  ask your health care provider or pharmacist if you have questions. COMMON BRAND NAME(S): Aricept What should I tell my health care provider before I take this medicine? They need to know if you have any of these conditions: -asthma or other lung disease -difficulty passing urine -head injury -heart disease -history of irregular heartbeat -liver disease -seizures (convulsions) -stomach or intestinal disease, ulcers or stomach bleeding -an unusual or  allergic reaction to donepezil, other medicines, foods, dyes, or preservatives -pregnant or trying to get pregnant -breast-feeding How should I use this medicine? Take this medicine by mouth with a glass of water. Follow the directions on the prescription label. You may take this medicine with or without food. Take this medicine at regular intervals. This medicine is usually taken before bedtime. Do not take it more often than directed. Continue to take your medicine even if you feel better. Do not stop taking except on your doctor's advice. If you are taking the 23 mg donepezil tablet, swallow it whole; do not cut, crush, or chew it. Talk to your pediatrician regarding the use of this medicine in children. Special care may be needed. Overdosage: If you think you have taken too much of this medicine contact a poison control center or emergency room at once. NOTE: This medicine is only for you. Do not share this medicine with others. What if I miss a dose? If you miss a dose, take it as soon as you can. If it is almost time for your next dose, take only that dose, do not take double or extra doses. What may interact with this medicine? Do not take this medicine with any of the following medications: -certain medicines for fungal infections like itraconazole, fluconazole, posaconazole, and voriconazole -cisapride -dextromethorphan; quinidine -dofetilide -dronedarone -pimozide -quinidine -thioridazine -ziprasidone This medicine may also interact with the following medications: -antihistamines for allergy, cough and cold -atropine -bethanechol -carbamazepine -certain medicines for bladder problems like oxybutynin, tolterodine -certain medicines for Parkinson's disease like benztropine, trihexyphenidyl -certain medicines for stomach problems like dicyclomine, hyoscyamine -certain medicines for travel sickness like scopolamine -dexamethasone -ipratropium -NSAIDs, medicines for pain and  inflammation, like ibuprofen or naproxen -other medicines for Alzheimer's disease -other medicines that prolong the QT interval (cause an abnormal heart rhythm) -phenobarbital -phenytoin -rifampin, rifabutin or rifapentine This list may not describe all possible interactions. Give your health care provider a list of all the medicines, herbs, non-prescription drugs, or dietary supplements you use. Also tell them if you smoke, drink alcohol, or use illegal drugs. Some items may interact with your medicine. What should I watch for while using this medicine? Visit your doctor or health care professional for regular checks on your progress. Check with your doctor or health care professional if your symptoms do not get better or if they get worse. You may get drowsy or dizzy. Do not drive, use machinery, or do anything that needs mental alertness until you know how this drug affects you. What side effects may I notice from receiving this medicine? Side effects that you should report to your doctor or health care professional as soon as possible: -allergic reactions like skin rash, itching or hives, swelling of the face, lips, or tongue -feeling faint or lightheaded, falls -loss of bladder control -seizures -signs and symptoms of a dangerous change in heartbeat or heart rhythm like chest pain; dizziness; fast or irregular heartbeat; palpitations; feeling faint or lightheaded, falls; breathing problems -signs and symptoms of infection like fever or chills; cough; sore throat; pain or  trouble passing urine -signs and symptoms of liver injury like dark yellow or brown urine; general ill feeling or flu-like symptoms; light-colored stools; loss of appetite; nausea; right upper belly pain; unusually weak or tired; yellowing of the eyes or skin -slow heartbeat or palpitations -unusual bleeding or bruising -vomiting Side effects that usually do not require medical attention (report to your doctor or health  care professional if they continue or are bothersome): -diarrhea, especially when starting treatment -headache -loss of appetite -muscle cramps -nausea -stomach upset This list may not describe all possible side effects. Call your doctor for medical advice about side effects. You may report side effects to FDA at 1-800-FDA-1088. Where should I keep my medicine? Keep out of reach of children. Store at room temperature between 15 and 30 degrees C (59 and 86 degrees F). Throw away any unused medicine after the expiration date. NOTE: This sheet is a summary. It may not cover all possible information. If you have questions about this medicine, talk to your doctor, pharmacist, or health care provider.  2018 Elsevier/Gold Standard (2015-10-04 21:00:42)

## 2017-11-20 NOTE — Progress Notes (Signed)
[facility]    Provider:  [name] Referring Provider: Carol Ada, MD Primary Care Physician:  Carol Ada, MD  CC: New problem dementia  Interval history 11/20/2017: Patient is here for new problem, dementia. In the past I have seen her for aneurysms and she denied memory loss and declined any workup. Today she returns for worsening symptoms.  She says she "loses her memory".  Will order a CTA of the head and neck to follow aneurysms per [name] recommendations.  She hasn't driven in 2 years. Lives with granddaughter. She has hallucinations and she can't retain short-term memories and she reads and can't understand reading she has difficulty with speech and is sometimes aphasic and cannot recall names of things and may need to describe them. Grandaughter says she repeats the same questions in the same day,  hearing issue unclear recommend having hearing check (speak with pcp). She has some behavior changes, maybe a and he sees thins that are not there, patient does not remember last time happened in May. More short-term memory deteriorating she can remember stories from the past and progressively worsening. When tired or end of the day is worse or in unfamiliar circumstances, no wandering at night. They are working on [employer] and autopay wth granddaughter. She manages her own medications and doesn't miss any pills. Mother had Alzheimers.   IMPRESSION: 1. No acute intracranial abnormality identified. 2. Stable small chronic infarcts within right midbrain and bilateral cerebellar hemispheres. 3. Stable moderate chronic microvascular ischemic changes and parenchymal volume loss of the brain.  Interval history 09/06/2016: She was seen by [name] and at this point he is watching the aneurysms. She is going to followup with him in 6 months. She lives with her granddaughter [name]. Patient denies any significant memory issues. Declines eval or treatment. Patient  doesn't want follow up/ I recommended a year follow up at least, would prefer every 6 months. She follows with [name] and he is managing her glaucoma and other disorders.   Interval history 05/09/2016: Her blood pressure varies from 202 systolic up to 542 per patient and today it is 706 systolic. But often times it is low at home per patient. I have asked her document blood pressures daily and take them to her pcp for management of her BP medications . I discussed her memory today. Patient was having hallucinations, men and women in [place] garb, cats and dogs and little kids. They had decorated the credenza and sometimes they brush against her and she can feel it. She knows they are not real. It bothers her a little bit. 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 She is having significant vision changes, will refer to ophthalmology [name] for evaluation.  Sometimes the hallucinations would be on the wall other times int he middle of the room. She lives with her granddaughter. She pays her own bills but having more difficulty with paying bills, she is having a lot of vision changes which is impacting her. She can't read anymore. She is having difficulty with comprehension. She opens her purse and can't find the zipper because of sight. No more episodes of confusion per patient. Discussed an ambulatory 72-hour eeg but she declines. She denies any memory changes.   Interval history: Her blood pressure is extremely elevated, she has an appt this month, not symptomatic. She had an episode of confusion, couldn;t dial anybody, couldn;t remember anyone's names, She was in  the house and no one else was there and she felt like she was having another episode. She remembers the whole event, she called 911 and the ambulance came. Repeat MRi did not show any new strokes. She has not had any episodes since then. She was able to talk to EMS. She has been dizzy after taking  propranolol, advised to discuss with primary including elevated blood prussure. Will order CTA of the head to further examine aneurysms, warned she needs to manage her BP due to risk of rupture, follow up with pcp int he next few weeks. Friend with her, they both acknowledge understanding.  IDP:OEUMPNT A Laytonis a 84 y.o.femalehere as a referral from Dr. Denzil Magnuson TIA. She has a past medical history of vitamin D deficiency, high cholesterol, hypertension, atherosclerotic heart disease, osteoarthritis, glaucoma, history of coronary artery bypass, TIA. Patient was out shopping and she suddenly couldn't talk, words wouldn;t come. No inciting event or previous illnesses or head trauma. MRi of the brain was negative for acute event. Friend is here and provides information. Friend says patient was confused for a short period of time afterwards, Patient couldn't find her keys, she was confused, had a difficult time finding money right in her purse, she couldn;t make complete sentences. Patient says she remembers it all, no LOC or altered awareness. She was thinking but she couldn't make words come out. Symptoms resolved completely. However she is also having hallucination recently, she is also seeing people, it just started a day and a half ago, she is seeing people one had a cat with them and she sawsomeone in the yard. They were in her bedroom all night. Just started since yesterday, in the past week patient is more confused, she was seen Monday with Dr. Tamala Julian and they say a work up for metabolic/infectios causes ruled out however they do not think urine was taken. Patient is more confused in the last few weeks. She is having difficulty saying things, not completingsentences. More confusion. Also progressive memory changes the last year. Sister and mother with Alzheimer's dz  Cholesterol LDL 151. CMP 08/16/2015 with BUN 24 and creatinine 0.91.hgba1c 5.4.  Reviewed notes, labs and imaging from outside  physicians, which showed:  Personally reviewed MRI images of the brain and garee with the following: IMPRESSION: 1. No acute intracranial abnormality. 2. Moderate chronic small vessel ischemic disease. 3. Chronic cerebellar infarcts. 4. No major intracranial branch vessel occlusion or significant proximal stenosis. 5. 5 mm right ICA paraophthalmic aneurysm. 6. Possible 3 mm left ICA aneurysm near the petrous-cavernous Junction.  EEG normal. ECHO did not show any clots in the heart.   Review of Systems: Patient complains of symptoms per HPI as well as the following symptoms:weight loss, fatigue, blurred vision, easy bruising him a hearing loss, runny nose, memory loss, confusion, headache, numbness, weakness, dizziness, tremor, decreased energy, change in appetite, disinterest in activities ertinent negatives per HPI. All others negative.  Social History   Socioeconomic History  . Marital status: Widowed    Spouse name: Not on file  . Number of children: 2  . Years of education: 16+  . Highest education level: Not on file  Occupational History  . Occupation: retired  Scientific laboratory technician  . Financial resource strain: Not on file  . Food insecurity:    Worry: Not on file    Inability: Not on file  . Transportation needs:    Medical: Not on file    Non-medical: Not on file  Tobacco Use  .  Smoking status: Never Smoker  . Smokeless tobacco: Never Used  Substance and Sexual Activity  . Alcohol use: Not Currently    Alcohol/week: 0.6 oz    Types: 1 Standard drinks or equivalent per week    Comment: 1 glass of liquor weekly   . Drug use: No  . Sexual activity: Not on file  Lifestyle  . Physical activity:    Days per week: Not on file    Minutes per session: Not on file  . Stress: Not on file  Relationships  . Social connections:    Talks on phone: Not on file    Gets together: Not on file    Attends religious service: Not on file    Active member of club or organization: Not  on file    Attends meetings of clubs or organizations: Not on file    Relationship status: Not on file  . Intimate partner violence:    Fear of current or ex partner: Not on file    Emotionally abused: Not on file    Physically abused: Not on file    Forced sexual activity: Not on file  Other Topics Concern  . Not on file  Social History Narrative   She is a widowed, mother of 2 grandmother of 3.    Lives with granddaughter -[name]   Drinks coffee every morning. 1 cup.   She used to do exercising through the [employer] program as well as the [employer]. Unfortunately, this finding of the classes for seniors changed, and she was unable to make the new classes. She did not like exercising with non-seniors. Besides that she does existing disease, walks up and down the stairs, walk her dog. She is not as active as he had been before.   She never smoked, never drank alcohol.       Family History  Problem Relation Age of Onset  . Alzheimer's disease Mother   . Alzheimer's disease Sister   . Hyperlipidemia Sister   . Hypertension Sister   . Diabetes Sister   . Hyperlipidemia Sister   . Hypertension Sister   . Pancreatitis Child     Past Medical History:  Diagnosis Date  . Actinic keratosis   . Allergic rhinitis   . Aneurysm ([place])   . Arthritis    osteoarthritis, back, hands, wrists  . Basal cell carcinoma   . Bursitis    hips bilat   . CAD (coronary artery disease), autologous vein bypass graft March 2008   Follow-up cath: December 2015 Occluded SVG-D1 and occluded SVG-RI.  [name] CAD in native artery 2005, 03/2014   a.  Severe disease noted in LAD & RI --> referred for CABG x3; b.  Follow-up cath for abnormal Myoview: Occluded SVG-RI and SVG-D1 with patent LIMA-LAD and competitive flow.  60-70% LAD and RI lesions.  Otherwise minimal disease.  EF 70%.  . Cancer ([place])    + basal cell- on leg  . Cataract   . Dyslipidemia    Statin intolerant  . Glaucoma   . History of  kidney stones   . Hypertension   . Non-STEMI (non-ST elevated myocardial infarction) [facility]) October 2005   EF by 35-40%, echo 40-50%. Angiography: 99% mid LAD involving D1 followed by 70% mid LAD; 80% RI. --> CABG  . Osteoarthritis   . PONV (postoperative nausea and vomiting)   . S/P CABG x 01 February 2004   LIMA-LAD, SVG-D1, SVG-RI.  [name] Statin intolerance   . Stroke (  [facility])     Past Surgical History:  Procedure Laterality Date  . BUNIONECTOMY     2002  . CARDIOVASCULAR STRESS TEST  05/23/2006   Mild lateral/inferolateral ischemia, likely due to occluded SVGs with exhisting disease.  [name] CAROTID DOPPLER  07/15/2009   Bilat ICAs 0-49% diameter reduction. Normal patency of Bilat subclavian arteries.  . CORONARY ARTERY BYPASS GRAFT  02/22/2004   x3. LIMA to distal LAD, SVG to first diag, SVG to ramus. SVG harvest from rt thigh.  . EP IMPLANTABLE DEVICE N/A 03/02/2016   Procedure: Loop Recorder Insertion;  Surgeon: Thompson Grayer, MD;  Location: [place] [facility];  Service: Cardiovascular;  Laterality: N/A;  . EXCISION/RELEASE BURSA HIP Right 06/15/2015   Procedure: RIGHT HIP BURSECTOMY WITH GLUTEAL TENDON REPAIR;  Surgeon: Gaynelle Arabian, MD;  Location: [facility];  Service: Orthopedics;  Laterality: Right;  . EYE SURGERY     cataract surgery bilat   . IR ANGIO INTRA EXTRACRAN SEL COM CAROTID INNOMINATE BILAT MOD SED  08/07/2016  . IR ANGIO VERTEBRAL SEL SUBCLAVIAN INNOMINATE BILAT MOD SED  08/07/2016  . IR GENERIC HISTORICAL  06/01/2016   IR RADIOLOGIST EVAL & MGMT 06/01/2016 [facility]-INTER[facility]  . IR RADIOLOGIST EVAL & MGMT  05/07/2017  . LEFT HEART CATHETERIZATION WITH CORONARY ANGIOGRAM  02/19/2004   Significant 2 vessel CAD - LAD, D1 and RI  . LEFT HEART CATHETERIZATION WITH CORONARY/GRAFT ANGIOGRAM N/A 04/28/2014   Procedure: LEFT HEART CATHETERIZATION WITH Beatrix Fetters;  Surgeon: Sinclair Grooms, MD;  Location: [facility] CATH LAB: CTO of SVG-Diag & SVG-RI, patent LIMA-LAD. Patent native circumflex, LAD  and RCA. 70% stenosis in a branch of RI. --> Does not explain "high risk perfusion study "  . LEFT HEART CATHETERIZATION WITH CORONARY/GRAFT ANGIOGRAM   07/25/2006   Totally occluded SVG to diag and ramus. Patent LIMA-LAD. Ramus 70-80% proximal stenosis and occluded vein graft.  [name] left total knee replacement      2001  . NM MYOVIEW LTD  January 2008; December 2015   a. Referred for For mild inferolateral and anterolateral/apical lateral defect with mild reversibility.;; b. Large defect in the anterior and inferior wall. Suggestive of potential infarct plus ischemia. HIGH RISK.  [name] RADIOLOGY WITH ANESTHESIA N/A 08/07/2016   Procedure: EMBOLIZATION;  Surgeon: Luanne Bras, MD;  Location: [place];  Service: Radiology;  Laterality: N/A;  . right total knee replacement      2003  . TRANSTHORACIC ECHOCARDIOGRAM  02/19/2004; December 2015   a. EF 45-50%, Normal LV function, moderate hypokinesis of anterior wall.;; b. EF 50-55%. No RWMA, GR 1 DD. Normal valves     Current Outpatient Medications  Medication Sig Dispense Refill  . acetaminophen (TYLENOL) 500 MG tablet Take 500 mg by mouth every 4 (four) hours as needed (for pain).     [name] aspirin EC 81 MG tablet Take 81 mg by mouth at bedtime.     [name] atenolol (TENORMIN) 25 MG tablet Take 1 tablet (25 mg total) daily by mouth. May take an additional 25 mg of medication daily, if SBP> 160 or DBP >100 (Patient taking differently: Take 25 mg by mouth at bedtime. May take an additional 25 mg of medication daily, if SBP> 160 or DBP >100) 60 tablet 11  . brimonidine (ALPHAGAN P) 0.1 % SOLN Place 1 drop into both eyes 2 (two) times daily.     . brinzolamide (AZOPT) 1 % ophthalmic suspension Place 1 drop into both eyes 2 (two) times daily.     [name]  Cholecalciferol (VITAMIN D3) 2000 UNITS capsule Take 2,000 Units by mouth at bedtime.     . clopidogrel (PLAVIX) 75 MG tablet Take 1 tablet (75 mg total) by mouth daily. (Patient taking differently: Take 75 mg by mouth at  bedtime. ) 30 tablet 10  . latanoprost (XALATAN) 0.005 % ophthalmic solution Place 1 drop into both eyes at bedtime.  3  . lisinopril (PRINIVIL,ZESTRIL) 20 MG tablet Take 20 mg by mouth at bedtime.     . timolol (BETIMOL) 0.5 % ophthalmic solution Place 1 drop into both eyes daily.     . dorzolamide-timolol (COSOPT) 22.3-6.8 MG/ML ophthalmic solution INSTILL 1 DROP INTO LEFT EYE TWICE A DAY  3   No current facility-administered medications for this visit.     Allergies as of 11/20/2017 - Review Complete 11/20/2017  Allergen Reaction Noted  . Nsaids Other (See Comments) 05/08/2013  . Statins Other (See Comments) 05/08/2013  . Tricor [fenofibrate] Other (See Comments) 05/08/2013  . Zetia [ezetimibe] Other (See Comments) 05/08/2013  . Lescol [fluvastatin]  11/20/2017  . Livalo [pitavastatin]  11/20/2017  . Niaspan [niacin]  11/20/2017  . Relafen [nabumetone] Rash 11/20/2017    Vitals: BP (!) 192/89 (BP Location: Right Arm, Patient Position: Sitting)   Pulse 64   Ht 5\' 6"  (1.676 m)   Wt 123 lb (55.8 kg)   BMI 19.85 kg/m  Last Weight:  Wt Readings from Last 1 Encounters:  11/20/17 123 lb (55.8 kg)   Last Height:   Ht Readings from Last 1 Encounters:  11/20/17 5\' 6"  (1.676 m)     Cranial Nerves: The pupils are equal, round, and reactive to light. Attempted fundoscopic exam could not visualiz. Visual fields are full to finger confrontation. Extraocular movements are intact. Trigeminal sensation is intact and the muscles of mastication are normal. The face is symmetric. The palate elevates in the midline. Hearing intact. Voice is normal. Shoulder shrug is normal. The tongue has normal motion without fasciculations.   Coordination: Normal finger to nose and heel to shin.  Gait: Antalgic due to right leg pain (says she had a surgery on the right hip tendon and since then difficulty walking)  Motor Observation: mild postural and head tremor. No resting tremor.  Tone:  Mild cogwheeling right >left UE. [name]   Posture: Posture is normal. normal erect  Strength: Mild prox leg weakness bilat otherwise strength is V/V in the upper and lower limbs.   Sensation: intact to LT  Reflex Exam:  DTR's: Deep tendon reflexes in the upper and lower extremities are normal bilaterally.  Toes: The toes are downgoing bilaterally.  Clonus: Clonus is absent.   MMSE - Mini Mental State Exam 11/20/2017 01/27/2016  Orientation to time 5 4  Orientation to Place 5 4  Registration 3 3  Attention/ Calculation 3 2  Recall 2 2  Language- name 2 objects 2 2  Language- repeat 1 1  Language- follow 3 step command 3 3  Language- read & follow direction 1 1  Write a sentence 1 1  Copy design 0 0  Total score 26 23   .   Assessment/Plan:85 y.o. femalehere as a referral from Dr. Denzil Magnuson new problem, memory loss. She has a past medical history of vitamin D deficiency, high cholesterol, hypertension, atherosclerotic heart disease, osteoarthritis, glaucoma, coronary artery bypass. Increased short-term memory loss, more confused in the last few months, decreased hallucinations. She is having significant vision changes as well, her pcp has suggested she may have Sherran Needs  syndrome to explain the hallucinations which is a possibility but hallucinations improved.   - Patient with multiple bilateral cerebellar infarcts already on plavix and aspirin. Cryptogenic may be embolic. Had loop recorder placed - 26/30 on MMSE with a Fhx of alzheimers, concerning short-term memory changes progressive - 78mm and 45mm aneurysms, discussed with patient and friend and risk of bleeding especially with her elevated BP. Was referred to [name]. Will repeat CTA head and neck. - Discussed dementia, will order formal neurocognitive testing and labs today. They decline FDG PET at this time or Aricept at this time - Significant vision changes, Ophthalmology follows  -  if she has repeat strokes and loop does not record afib, then due to cryptogenic embolic strokes there is an option to start a blood thinner such as coumadin (could not use DOAcs not indicated for this). Discussed with her and she declines due to increased risks. - Hearing loss: Recommend discuss referral from pcp for audiology may be contributing to her cognitive changes  -  I had a long d/w patient about her strokes/TIA, risk for recurrent stroke/TIAs, personally independently reviewed imaging studies and stroke evaluation results and answered questions.Continue Plavix and asa for secondary stroke prevention and maintain strict control of hypertension with blood pressure goal below 130/90, diabetes with hemoglobin A1c goal below 6.5% and lipids with LDL cholesterol goal below 70 mg/dL. I also advised the patient to eat a healthy diet with plenty of whole grains, cereals, fruits and vegetables, exercise regularly and maintain ideal body weight .  Followup in the future with me in 6-9 months or call earlier if necessary.  Orders Placed This Encounter  Procedures  . CT ANGIO HEAD W OR WO CONTRAST  . B12 and Folate Panel  . Methylmalonic acid, serum  . Homocysteine  . RPR  . TSH  . CBC  . Comprehensive metabolic panel  . Ambulatory referral to Neuropsychology     Sarina Ill, MD  [facility] [address] [place], [place] [address]  Phone [phone] Fax [phone]  A total of 45 minutes was spent face-to-face with this patient. Over half this time was spent on counseling patient on the  1. Memory loss   2. Aneurysm, cerebral, nonruptured   3. TIA (transient ischemic attack)   4. Cerebrovascular accident (CVA), unspecified mechanism ([place])   5. Cerebellar infarct ([place])   6. B12 deficiency   7. Nonruptured cerebral aneurysm     diagnosis and different diagnostic and therapeutic options available.

## 2017-11-21 ENCOUNTER — Telehealth: Payer: Self-pay | Admitting: Neurology

## 2017-11-21 NOTE — Telephone Encounter (Signed)
Medicare/bcbs fed no auth order sent to GI and they will reach out to the pt to schedule.

## 2017-11-22 ENCOUNTER — Telehealth: Payer: Self-pay | Admitting: Neurology

## 2017-11-22 LAB — COMPREHENSIVE METABOLIC PANEL
ALK PHOS: 76 IU/L (ref 39–117)
ALT: 9 IU/L (ref 0–32)
AST: 12 IU/L (ref 0–40)
Albumin/Globulin Ratio: 2.1 (ref 1.2–2.2)
Albumin: 4.6 g/dL (ref 3.5–4.7)
BUN/Creatinine Ratio: 15 (ref 12–28)
BUN: 12 mg/dL (ref 8–27)
Bilirubin Total: 0.7 mg/dL (ref 0.0–1.2)
CHLORIDE: 107 mmol/L — AB (ref 96–106)
CO2: 23 mmol/L (ref 20–29)
Calcium: 9.6 mg/dL (ref 8.7–10.3)
Creatinine, Ser: 0.79 mg/dL (ref 0.57–1.00)
GFR calc Af Amer: 79 mL/min/{1.73_m2} (ref >59)
GFR calc non Af Amer: 69 mL/min/{1.73_m2} (ref >59)
GLOBULIN, TOTAL: 2.2 g/dL (ref 1.5–4.5)
GLUCOSE: 91 mg/dL (ref 65–99)
Potassium: 4.2 mmol/L (ref 3.5–5.2)
SODIUM: 145 mmol/L — AB (ref 134–144)
Total Protein: 6.8 g/dL (ref 6.0–8.5)

## 2017-11-22 LAB — HOMOCYSTEINE: HOMOCYSTEINE: 26.2 umol/L — AB (ref 0.0–15.0)

## 2017-11-22 LAB — B12 AND FOLATE PANEL
FOLATE: 3.3 ng/mL (ref >3.0)
VITAMIN B 12: 193 pg/mL — AB (ref 232–1245)

## 2017-11-22 LAB — METHYLMALONIC ACID, SERUM: Methylmalonic Acid: 396 nmol/L — ABNORMAL HIGH (ref 0–378)

## 2017-11-22 LAB — CBC
Hematocrit: 38.6 % (ref 34.0–46.6)
Hemoglobin: 13.2 g/dL (ref 11.1–15.9)
MCH: 31.7 pg (ref 26.6–33.0)
MCHC: 34.2 g/dL (ref 31.5–35.7)
MCV: 93 fL (ref 79–97)
Platelets: 279 10*3/uL (ref 150–450)
RBC: 4.16 x10E6/uL (ref 3.77–5.28)
RDW: 14.3 % (ref 12.3–15.4)
WBC: 5.1 10*3/uL (ref 3.4–10.8)

## 2017-11-22 LAB — TSH: TSH: 2.3 u[IU]/mL (ref 0.450–4.500)

## 2017-11-22 LAB — RPR: RPR Ser Ql: NONREACTIVE

## 2017-11-22 NOTE — Telephone Encounter (Signed)
LMVM for pt to return call about her B12 test results and recommendations.

## 2017-11-22 NOTE — Telephone Encounter (Signed)
Sandy: please discuss with patient:  Patient is B12 deficient, this was discussed at last appointment. 2 options: 1. 1032mcg b12 shots weekly for 4 weeks and then once a month for 5 months. recheck B12 in 6 months and if normal switch to 1023mcg B12 daily po long term 2. If too difficult to come for injections here (or at pcp) then 1053mcg-2000mcg B12 daily po long term recheck in 8 weeks

## 2017-11-23 NOTE — Telephone Encounter (Signed)
Spoke to grand daughter, [name] and let her know the results.  They will start the injections next week.  She verbalized udnerstanding of instructions.

## 2017-11-26 ENCOUNTER — Ambulatory Visit (INDEPENDENT_AMBULATORY_CARE_PROVIDER_SITE_OTHER): Payer: Medicare Other | Admitting: *Deleted

## 2017-11-26 DIAGNOSIS — E538 Deficiency of other specified B group vitamins: Secondary | ICD-10-CM | POA: Diagnosis not present

## 2017-11-26 MED ORDER — CYANOCOBALAMIN 1000 MCG/ML IJ SOLN
1000.0000 ug | Freq: Once | INTRAMUSCULAR | Status: AC
  Administered 2017-11-26: 1000 ug via INTRAMUSCULAR

## 2017-11-26 NOTE — Progress Notes (Signed)
Pt arrived for Vitamin B12 1000 mcg IM injection x 1. Pt tolerated well. Bandaid applied. See MAR. Plan for next injection next Monday 12/03/17.

## 2017-11-30 ENCOUNTER — Ambulatory Visit: Payer: Medicare Other

## 2017-12-03 ENCOUNTER — Ambulatory Visit (INDEPENDENT_AMBULATORY_CARE_PROVIDER_SITE_OTHER): Payer: Medicare Other | Admitting: *Deleted

## 2017-12-03 DIAGNOSIS — E538 Deficiency of other specified B group vitamins: Secondary | ICD-10-CM | POA: Diagnosis not present

## 2017-12-03 MED ORDER — CYANOCOBALAMIN 1000 MCG/ML IJ SOLN
1000.0000 ug | Freq: Once | INTRAMUSCULAR | Status: AC
  Administered 2017-12-03: 1000 ug via INTRAMUSCULAR

## 2017-12-03 NOTE — Progress Notes (Signed)
Patient arrived and given 2nd weekly B12 injection 1000 mcg in R deltoid per pt choice. She tolerated well. Bandaid applied. Aseptic technique maintained.

## 2017-12-05 ENCOUNTER — Ambulatory Visit
Admission: RE | Admit: 2017-12-05 | Discharge: 2017-12-05 | Disposition: A | Discharge status: Home / Self Care | Payer: Medicare Other | Source: Ambulatory Visit | Attending: Neurology | Admitting: Neurology

## 2017-12-05 DIAGNOSIS — I671 Cerebral aneurysm, nonruptured: Secondary | ICD-10-CM | POA: Diagnosis not present

## 2017-12-05 MED ORDER — IOPAMIDOL (ISOVUE-370) INJECTION 76%
75.0000 mL | Freq: Once | INTRAVENOUS | Status: AC | PRN
  Administered 2017-12-05: 75 mL via INTRAVENOUS

## 2017-12-06 ENCOUNTER — Telehealth: Payer: Self-pay | Admitting: *Deleted

## 2017-12-06 NOTE — Telephone Encounter (Addendum)
Called pt & LVM asking for call back. Left office number and hours in message.   ----- Message from Melvenia Beam, MD sent at 12/06/2017 10:54 AM EDT ----- Stable size of 5 mm aneurysm.

## 2017-12-07 NOTE — Telephone Encounter (Signed)
Pt returning RN's call.

## 2017-12-07 NOTE — Telephone Encounter (Signed)
Called the pt and reviewed the results of CT angio. Pt verbalized understanding. Pt had no questions at this time but was encouraged to call back if questions arise.

## 2017-12-10 ENCOUNTER — Ambulatory Visit (INDEPENDENT_AMBULATORY_CARE_PROVIDER_SITE_OTHER): Payer: Medicare Other | Admitting: *Deleted

## 2017-12-10 DIAGNOSIS — E538 Deficiency of other specified B group vitamins: Secondary | ICD-10-CM

## 2017-12-10 MED ORDER — CYANOCOBALAMIN 1000 MCG/ML IJ SOLN
1000.0000 ug | Freq: Once | INTRAMUSCULAR | Status: AC
  Administered 2017-12-10: 1000 ug via INTRAMUSCULAR

## 2017-12-10 NOTE — Progress Notes (Signed)
Pt here for Vitamin B12 injection #3 of 4 weekly injections then once every month x 6 months. See MAR. Injection tolerated well. No bleeding and bandaid applied.

## 2017-12-14 DIAGNOSIS — M7061 Trochanteric bursitis, right hip: Secondary | ICD-10-CM | POA: Diagnosis not present

## 2017-12-14 DIAGNOSIS — M25552 Pain in left hip: Secondary | ICD-10-CM | POA: Diagnosis not present

## 2017-12-17 ENCOUNTER — Ambulatory Visit (INDEPENDENT_AMBULATORY_CARE_PROVIDER_SITE_OTHER): Payer: Medicare Other | Admitting: *Deleted

## 2017-12-17 DIAGNOSIS — E538 Deficiency of other specified B group vitamins: Secondary | ICD-10-CM

## 2017-12-17 MED ORDER — CYANOCOBALAMIN 1000 MCG/ML IJ SOLN
1000.0000 ug | Freq: Once | INTRAMUSCULAR | Status: AC
  Administered 2017-12-17: 1000 ug via INTRAMUSCULAR

## 2017-12-17 NOTE — Progress Notes (Signed)
Pt arrived for her last weekly injection of Vitamin B12 1000 mcg IM. Pt's will continue with monthly injections x 6. Next injection on or around 01/17/18. Pt tolerated injection well. Slight bleeding resolved immediately. Bandaid applied.

## 2017-12-24 DIAGNOSIS — H903 Sensorineural hearing loss, bilateral: Secondary | ICD-10-CM | POA: Diagnosis not present

## 2018-01-17 ENCOUNTER — Ambulatory Visit (INDEPENDENT_AMBULATORY_CARE_PROVIDER_SITE_OTHER): Payer: Medicare Other | Admitting: *Deleted

## 2018-01-17 DIAGNOSIS — E538 Deficiency of other specified B group vitamins: Secondary | ICD-10-CM

## 2018-01-17 MED ORDER — CYANOCOBALAMIN 1000 MCG/ML IJ SOLN
1000.0000 ug | Freq: Once | INTRAMUSCULAR | Status: AC
  Administered 2018-01-17: 1000 ug via INTRAMUSCULAR

## 2018-01-17 NOTE — Progress Notes (Signed)
Pt given Vitamin B12 1000 mcg injection. This is her 1st of 6 monthly injections. Pt has completed her weekly injections x 4. Pt tolerated injection well. Aseptic technique used and bandaid applied. See MAR.

## 2018-01-28 ENCOUNTER — Other Ambulatory Visit: Payer: Self-pay | Admitting: Cardiology

## 2018-01-31 DIAGNOSIS — M25551 Pain in right hip: Secondary | ICD-10-CM | POA: Diagnosis not present

## 2018-01-31 DIAGNOSIS — M25552 Pain in left hip: Secondary | ICD-10-CM | POA: Diagnosis not present

## 2018-02-14 ENCOUNTER — Ambulatory Visit (INDEPENDENT_AMBULATORY_CARE_PROVIDER_SITE_OTHER): Payer: Medicare Other | Admitting: *Deleted

## 2018-02-14 DIAGNOSIS — E538 Deficiency of other specified B group vitamins: Secondary | ICD-10-CM | POA: Diagnosis not present

## 2018-02-14 MED ORDER — CYANOCOBALAMIN 1000 MCG/ML IJ SOLN
1000.0000 ug | Freq: Once | INTRAMUSCULAR | Status: AC
  Administered 2018-02-14: 1000 ug via INTRAMUSCULAR

## 2018-02-14 NOTE — Progress Notes (Signed)
Pt given Vitamin B12 1000 mcg injection. This is her 2nd of 6 monthly injections. Pt tolerated injection well. Bandaid applied. See MAR.

## 2018-02-26 DIAGNOSIS — Z23 Encounter for immunization: Secondary | ICD-10-CM | POA: Diagnosis not present

## 2018-03-11 DIAGNOSIS — Z961 Presence of intraocular lens: Secondary | ICD-10-CM | POA: Diagnosis not present

## 2018-03-11 DIAGNOSIS — H401133 Primary open-angle glaucoma, bilateral, severe stage: Secondary | ICD-10-CM | POA: Diagnosis not present

## 2018-03-11 DIAGNOSIS — H43813 Vitreous degeneration, bilateral: Secondary | ICD-10-CM | POA: Diagnosis not present

## 2018-03-18 ENCOUNTER — Ambulatory Visit: Payer: Medicare Other

## 2018-04-11 ENCOUNTER — Ambulatory Visit (INDEPENDENT_AMBULATORY_CARE_PROVIDER_SITE_OTHER): Payer: Medicare Other | Admitting: *Deleted

## 2018-04-11 DIAGNOSIS — E538 Deficiency of other specified B group vitamins: Secondary | ICD-10-CM | POA: Diagnosis not present

## 2018-04-11 MED ORDER — CYANOCOBALAMIN 1000 MCG/ML IJ SOLN
1000.0000 ug | Freq: Once | INTRAMUSCULAR | Status: AC
  Administered 2018-04-11: 1000 ug via INTRAMUSCULAR

## 2018-04-11 NOTE — Progress Notes (Signed)
Pt given Vitamin B12 1000 mcg injection. This is her 3rd of 6 monthly injections. Pt tolerated injection well. Bandaid applied. See MAR.Next injection scheduled for 05/15/17 @ 4:15 pm.

## 2018-04-17 DIAGNOSIS — R634 Abnormal weight loss: Secondary | ICD-10-CM | POA: Diagnosis not present

## 2018-04-17 DIAGNOSIS — I2510 Atherosclerotic heart disease of native coronary artery without angina pectoris: Secondary | ICD-10-CM | POA: Diagnosis not present

## 2018-04-17 DIAGNOSIS — E7800 Pure hypercholesterolemia, unspecified: Secondary | ICD-10-CM | POA: Diagnosis not present

## 2018-04-17 DIAGNOSIS — Z1389 Encounter for screening for other disorder: Secondary | ICD-10-CM | POA: Diagnosis not present

## 2018-04-17 DIAGNOSIS — Z0000 Encounter for general adult medical examination without abnormal findings: Secondary | ICD-10-CM | POA: Diagnosis not present

## 2018-04-17 DIAGNOSIS — I10 Essential (primary) hypertension: Secondary | ICD-10-CM | POA: Diagnosis not present

## 2018-04-17 DIAGNOSIS — H409 Unspecified glaucoma: Secondary | ICD-10-CM | POA: Diagnosis not present

## 2018-05-15 ENCOUNTER — Ambulatory Visit (INDEPENDENT_AMBULATORY_CARE_PROVIDER_SITE_OTHER): Payer: Medicare Other | Admitting: *Deleted

## 2018-05-15 DIAGNOSIS — E538 Deficiency of other specified B group vitamins: Secondary | ICD-10-CM

## 2018-05-15 MED ORDER — CYANOCOBALAMIN 1000 MCG/ML IJ SOLN
1000.0000 ug | Freq: Once | INTRAMUSCULAR | Status: AC
  Administered 2018-05-15: 1000 ug via INTRAMUSCULAR

## 2018-05-15 NOTE — Patient Instructions (Signed)
Pt here for B 12 injection.  Under aseptic technique cyanocobalamin 1068mcg/1ml IM given r deltoid   .  Tolerated well.  Bandaid applied.  Daugher is with pt.  Will see next month for # 5 out of 6.

## 2018-05-20 ENCOUNTER — Encounter: Payer: Medicare Other | Admitting: Psychology

## 2018-05-29 ENCOUNTER — Ambulatory Visit: Payer: Medicare Other | Admitting: Nurse Practitioner

## 2018-06-12 ENCOUNTER — Ambulatory Visit: Payer: Medicare Other

## 2018-06-13 ENCOUNTER — Ambulatory Visit (INDEPENDENT_AMBULATORY_CARE_PROVIDER_SITE_OTHER): Payer: Medicare Other | Admitting: *Deleted

## 2018-06-13 DIAGNOSIS — E538 Deficiency of other specified B group vitamins: Secondary | ICD-10-CM | POA: Diagnosis not present

## 2018-06-13 MED ORDER — CYANOCOBALAMIN 1000 MCG/ML IJ SOLN
1000.0000 ug | Freq: Once | INTRAMUSCULAR | Status: AC
  Administered 2018-06-13: 1000 ug via INTRAMUSCULAR

## 2018-06-13 NOTE — Progress Notes (Signed)
Pt given Vitamin B12 1000 mcg IM injection. This is her5of 6 monthly injections. Pt tolerated injection well. Bandaid applied. See MAR. Last injection planned to be given during pt's office visit with Hoyle Sauer on 07/10/2018 @ 3:15 pm and family would like to address the next steps at that appt.

## 2018-06-17 ENCOUNTER — Encounter: Payer: Medicare Other | Admitting: Psychology

## 2018-06-18 ENCOUNTER — Encounter: Payer: Medicare Other | Admitting: Psychology

## 2018-07-09 DIAGNOSIS — R413 Other amnesia: Secondary | ICD-10-CM | POA: Insufficient documentation

## 2018-07-09 DIAGNOSIS — E538 Deficiency of other specified B group vitamins: Secondary | ICD-10-CM | POA: Insufficient documentation

## 2018-07-09 NOTE — Progress Notes (Signed)
PATIENT: [name] DOB: March 04, 1932  REASON FOR VISIT: follow up HISTORY FROM: patient  Chief Complaint  Patient presents with  . Follow-up    6 month follow up. Granddaughter present. New room. Patient's granddauhgter mentioned for the last week and half she has been having some hallucinations. She also mentioned that she is becoming some what forgetful.      HISTORY OF PRESENT ILLNESS: Today 07/11/18 [name] is a 85 y.o. female here today for follow up for memory loss. She was found to have B12 deficiency at last visit with Erica [name] in 10/2017. She received 4 weekly injections and will complete her 6th monthly injection today. She presents today with her granddaughter that has not noticed any improvement in her memory. She is now requiring assistance to dress. She continues to be independent with bathing. She is dosing her own medications. She does not drive and cannot manage finances. She lives with her granddaughter and is staying home while she works.  She continues to have visual hallucinations at home.  Her granddaughter reports that she is seeing children and other people who are not in the home.  She does not appear frightened or disturbed by these hallucinations.  There are no other behavioral concerns.   HISTORY: (copied from [name] note on 11/20/2017) Interval history 11/20/2017: Patient is here for new problem, dementia. In the past I have seen her for aneurysms and she denied memory loss and declined any workup. Today she returns for worsening symptoms.  She says she "loses her memory".  Will order a CTA of the head and neck to follow aneurysms per [name] recommendations.  She hasn't driven in 2 years. Lives with granddaughter. She has hallucinations and she can't retain short-term memories and she reads and can't understand reading she has difficulty with speech and is sometimes aphasic and cannot recall names of things and may need to describe them.  Grandaughter says she repeats the same questions in the same day,  hearing issue unclear recommend having hearing check (speak with pcp). She has some behavior changes, maybe a and he sees thins that are not there, patient does not remember last time happened in May. More short-term memory deteriorating she can remember stories from the past and progressively worsening. When tired or end of the day is worse or in unfamiliar circumstances, no wandering at night. They are working on [employer] and autopay wth granddaughter. She manages her own medications and doesn't miss any pills. Mother had Alzheimers.   IMPRESSION: 1. No acute intracranial abnormality identified. 2. Stable small chronic infarcts within right midbrain and bilateral cerebellar hemispheres. 3. Stable moderate chronic microvascular ischemic changes and parenchymal volume loss of the brain.  Interval history 09/06/2016: She was seen by [name] and at this point he is watching the aneurysms. She is going to followup with him in 6 months. She lives with her granddaughter [name]. Patient denies any significant memory issues. Declines eval or treatment. Patient doesn't want follow up/ I recommended a year follow up at least, would prefer every 6 months. She follows with [name] and he is managing her glaucoma and other disorders.   Interval history 05/09/2016: Her blood pressure varies from 937 systolic up to 169 per patient and today it is 678 systolic. But often times it is low at home per patient. I have asked her document blood pressures daily and take them to her pcp for management of her BP medications. I discussed  her memory today. Patient was having hallucinations, men and women in [place] garb, cats and dogs and little kids. They had decorated the credenza and sometimes they brush against her and she can feel it. She knows they are not real. It bothers her a little bit. 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 She is having significant vision changes, will refer to ophthalmology [name] for evaluation. Sometimes the hallucinations would be on the wall other times int he middle of the room. She lives with her granddaughter. She pays her own bills but having more difficulty with paying bills, she is having a lot of vision changes which is impacting her. She can't read anymore. She is having difficulty with comprehension. She opens her purse and can't find the zipper because of sight. No more episodes of confusion per patient. Discussed an ambulatory 72-hour eeg but she declines. She denies any memory changes.   Interval history: Her blood pressure is extremely elevated, she has an appt this month, not symptomatic. She had an episode of confusion, couldn;t dial anybody, couldn;t remember anyone's names, She was in the house and no one else was there and she felt like she was having another episode. She remembers the whole event, she called 911 and the ambulance came. Repeat MRi did not show any new strokes. She has not had any episodes since then. She was able to talk to EMS. She has been dizzy after taking propranolol, advised to discuss with primary including elevated blood prussure. Will order CTA of the head to further examine aneurysms, warned she Cummings to manage her BP due to risk of rupture, follow up with pcp int he next few weeks. Friend with her, they both acknowledge understanding.  ZOX:WRUEAVW A Laytonis a 85 y.o.femalehere as a referral from Erica. Denzil Cummings TIA. She has a past medical history of vitamin D deficiency, high cholesterol, hypertension, atherosclerotic heart disease, osteoarthritis, glaucoma, history of coronary artery bypass, TIA. Patient was out shopping and she suddenly couldn't talk, words wouldn;t come. No inciting event or previous illnesses or head trauma. MRi of the brain was negative for acute event. Friend is here and provides information. Friend says  patient was confused for a short period of time afterwards, Patient couldn't find her keys, she was confused, had a difficult time finding money right in her purse, she couldn;t make complete sentences. Patient says she remembers it all, no LOC or altered awareness. She was thinking but she couldn't make words come out. Symptoms resolved completely. However she is also having hallucination recently, she is also seeing people, it just started a day and a half ago, she is seeing people one had a cat with them and she sawsomeone in the yard. They were in her bedroom all night. Just started since yesterday, in the past week patient is more confused, she was seen Monday with Erica. Tamala Julian and they say a work up for metabolic/infectios causes ruled out however they do not think urine was taken. Patient is more confused in the last few weeks. She is having difficulty saying things, not completingsentences. More confusion. Also progressive memory changes the last year. Sister and mother with Alzheimer's dz  Cholesterol LDL 151. CMP 08/16/2015 with BUN 24 and creatinine 0.91.hgba1c 5.4.  Reviewed notes, labs and imaging from outside physicians, which showed:  Personally reviewed MRI images of the brain and garee with the following: IMPRESSION: 1. No acute intracranial abnormality. 2. Moderate chronic small  vessel ischemic disease. 3. Chronic cerebellar infarcts. 4. No major intracranial branch vessel occlusion or significant proximal stenosis. 5. 5 mm right ICA paraophthalmic aneurysm. 6. Possible 3 mm left ICA aneurysm near the petrous-cavernous [place]unction.  EEG normal. ECHO did not show any clots in the heart.  REVIEW OF SYSTEMS: Out of a complete 14 system review of symptoms, the patient complains only of the following symptoms, light sensitivity, loss of vision, walking difficulty, memory loss, dizziness, speech difficulty, confusion, hallucinations and all other reviewed systems are  negative.  ALLERGIES: Allergies  Allergen Reactions  . Nsaids Other (See Comments)    URINARY RETENTION  . Statins Other (See Comments)    MYALGIAS HURTS ALL OVER  . Tricor [Fenofibrate] Other (See Comments)    MYALGIAS HURTS ALL OVER  . Zetia [Ezetimibe] Other (See Comments)    MYALGIAS HURTS ALL OVER  . Lescol [Fluvastatin]     Muscle pain  . Livalo [Pitavastatin]     Muscle pain  . Niaspan [Niacin]     Severe flushing   . Relafen [Nabumetone] Rash    HOME MEDICATIONS: Outpatient Medications Prior to Visit  Medication Sig Dispense Refill  . acetaminophen (TYLENOL) 500 MG tablet Take 500 mg by mouth every 4 (four) hours as needed (for pain).     [name] aspirin EC 81 MG tablet Take 81 mg by mouth at bedtime.     [name] atenolol (TENORMIN) 25 MG tablet Take 1 tablet (25 mg total) daily by mouth. May take an additional 25 mg of medication daily, if SBP> 160 or DBP >100 (Patient taking differently: Take 25 mg by mouth at bedtime. May take an additional 25 mg of medication daily, if SBP> 160 or DBP >100) 60 tablet 11  . brimonidine (ALPHAGAN P) 0.1 % SOLN Place 1 drop into both eyes 2 (two) times daily.     . brinzolamide (AZOPT) 1 % ophthalmic suspension Place 1 drop into both eyes 2 (two) times daily.     . Cholecalciferol (VITAMIN D3) 2000 UNITS capsule Take 2,000 Units by mouth at bedtime.     . clopidogrel (PLAVIX) 75 MG tablet TAKE 1 TABLET BY MOUTH EVERY DAY 90 tablet 3  . dorzolamide (TRUSOPT) 2 % ophthalmic solution Place 1 drop into both eyes 2 (two) times daily.    . dorzolamide-timolol (COSOPT) 22.3-6.8 MG/ML ophthalmic solution INSTILL 1 DROP INTO LEFT EYE TWICE A DAY  3  . latanoprost (XALATAN) 0.005 % ophthalmic solution Place 1 drop into both eyes at bedtime.  3  . lisinopril (PRINIVIL,ZESTRIL) 20 MG tablet Take 20 mg by mouth at bedtime.     . timolol (BETIMOL) 0.5 % ophthalmic solution Place 1 drop into both eyes daily.      No facility-administered medications prior to  visit.     PAST MEDICAL HISTORY: Past Medical History:  Diagnosis Date  . Actinic keratosis   . Allergic rhinitis   . Aneurysm ([place])   . Arthritis    osteoarthritis, back, hands, wrists  . Basal cell carcinoma   . Bursitis    hips bilat   . CAD (coronary artery disease), autologous vein bypass graft March 2008   Follow-up cath: December 2015 Occluded SVG-D1 and occluded SVG-RI.  [name] CAD in native artery 2005, 03/2014   a.  Severe disease noted in LAD & RI --> referred for CABG x3; b.  Follow-up cath for abnormal Myoview: Occluded SVG-RI and SVG-D1 with patent LIMA-LAD and competitive flow.  60-70% LAD and RI lesions.  Otherwise minimal disease.  EF 70%.  . Cancer ([place])    + basal cell- on leg  . Cataract   . Dyslipidemia    Statin intolerant  . Glaucoma   . History of kidney stones   . Hypertension   . Non-STEMI (non-ST elevated myocardial infarction) [facility]) October 2005   EF by 35-40%, echo 40-50%. Angiography: 99% mid LAD involving D1 followed by 70% mid LAD; 80% RI. --> CABG  . Osteoarthritis   . PONV (postoperative nausea and vomiting)   . S/P CABG x 01 February 2004   LIMA-LAD, SVG-D1, SVG-RI.  [name] Statin intolerance   . Stroke [facility])     PAST SURGICAL HISTORY: Past Surgical History:  Procedure Laterality Date  . BUNIONECTOMY     2002  . CARDIOVASCULAR STRESS TEST  05/23/2006   Mild lateral/inferolateral ischemia, likely due to occluded SVGs with exhisting disease.  [name] CAROTID DOPPLER  07/15/2009   Bilat ICAs 0-49% diameter reduction. Normal patency of Bilat subclavian arteries.  . CORONARY ARTERY BYPASS GRAFT  02/22/2004   x3. LIMA to distal LAD, SVG to first diag, SVG to ramus. SVG harvest from rt thigh.  . EP IMPLANTABLE DEVICE N/A 03/02/2016   Procedure: Loop Recorder Insertion;  Surgeon: Thompson Grayer, MD;  Location: [facility];  Service: Cardiovascular;  Laterality: N/A;  . EXCISION/RELEASE BURSA HIP Right 06/15/2015   Procedure: RIGHT HIP BURSECTOMY WITH  GLUTEAL TENDON REPAIR;  Surgeon: Gaynelle Arabian, MD;  Location: [facility];  Service: Orthopedics;  Laterality: Right;  . EYE SURGERY     cataract surgery bilat   . IR ANGIO INTRA EXTRACRAN SEL COM CAROTID INNOMINATE BILAT MOD SED  08/07/2016  . IR ANGIO VERTEBRAL SEL SUBCLAVIAN INNOMINATE BILAT MOD SED  08/07/2016  . IR GENERIC HISTORICAL  06/01/2016   IR RADIOLOGIST EVAL & MGMT 06/01/2016 [facility]-INTERV RAD  . IR RADIOLOGIST EVAL & MGMT  05/07/2017  . LEFT HEART CATHETERIZATION WITH CORONARY ANGIOGRAM  02/19/2004   Significant 2 vessel CAD - LAD, D1 and RI  . LEFT HEART CATHETERIZATION WITH CORONARY/GRAFT ANGIOGRAM N/A 04/28/2014   Procedure: LEFT HEART CATHETERIZATION WITH Beatrix Fetters;  Surgeon: Sinclair Grooms, MD;  Location: [facility] CATH LAB: CTO of SVG-Diag & SVG-RI, patent LIMA-LAD. Patent native circumflex, LAD and RCA. 70% stenosis in a branch of RI. --> Does not explain "high risk perfusion study "  . LEFT HEART CATHETERIZATION WITH CORONARY/GRAFT ANGIOGRAM   07/25/2006   Totally occluded SVG to diag and ramus. Patent LIMA-LAD. Ramus 70-80% proximal stenosis and occluded vein graft.  [name] left total knee replacement      2001  . NM MYOVIEW LTD  January 2008; December 2015   a. Referred for For mild inferolateral and anterolateral/apical lateral defect with mild reversibility.;; b. Large defect in the anterior and inferior wall. Suggestive of potential infarct plus ischemia. HIGH RISK.  [name] RADIOLOGY WITH ANESTHESIA N/A 08/07/2016   Procedure: EMBOLIZATION;  Surgeon: Luanne Bras, MD;  Location: [facility];  Service: Radiology;  Laterality: N/A;  . right total knee replacement      2003  . TRANSTHORACIC ECHOCARDIOGRAM  02/19/2004; December 2015   a. EF 45-50%, Normal LV function, moderate hypokinesis of anterior wall.;; b. EF 50-55%. No RWMA, GR 1 DD. Normal valves     FAMILY HISTORY: Family History  Problem Relation Age of Onset  . Alzheimer's disease Mother   . Alzheimer's disease Sister   .  Hyperlipidemia Sister   . Hypertension Sister   .  Diabetes Sister   . Hyperlipidemia Sister   . Hypertension Sister   . Pancreatitis Child     SOCIAL HISTORY: Social History   Socioeconomic History  . Marital status: Widowed    Spouse name: Not on file  . Number of children: 2  . Years of education: 16+  . Highest education level: Not on file  Occupational History  . Occupation: retired  Scientific laboratory technician  . Financial resource strain: Not on file  . Food insecurity:    Worry: Not on file    Inability: Not on file  . Transportation Cummings:    Medical: Not on file    Non-medical: Not on file  Tobacco Use  . Smoking status: Never Smoker  . Smokeless tobacco: Never Used  Substance and Sexual Activity  . Alcohol use: Not Currently    Alcohol/week: 1.0 standard drinks    Types: 1 Standard drinks or equivalent per week    Comment: 1 glass of liquor weekly   . Drug use: No  . Sexual activity: Not on file  Lifestyle  . Physical activity:    Days per week: Not on file    Minutes per session: Not on file  . Stress: Not on file  Relationships  . Social connections:    Talks on phone: Not on file    Gets together: Not on file    Attends religious service: Not on file    Active member of club or organization: Not on file    Attends meetings of clubs or organizations: Not on file    Relationship status: Not on file  . Intimate partner violence:    Fear of current or ex partner: Not on file    Emotionally abused: Not on file    Physically abused: Not on file    Forced sexual activity: Not on file  Other Topics Concern  . Not on file  Social History Narrative   She is a widowed, mother of 2 grandmother of 3.    Lives with granddaughter -[name]   Drinks coffee every morning. 1 cup.   She used to do exercising through the [employer] program as well as the [employer]. Unfortunately, this finding of the classes for seniors changed, and she was unable to make the new classes.  She did not like exercising with non-seniors. Besides that she does existing disease, walks up and down the stairs, walk her dog. She is not as active as he had been before.   She never smoked, never drank alcohol.         PHYSICAL EXAM  Vitals:   07/10/18 1507  BP: (!) 166/79  Pulse: 68  Weight: 117 lb (53.1 kg)  Height: 5\' 6"  (1.676 m)   Body mass index is 18.88 kg/m.  Generalized: Well developed, in no acute distress  Cardiology: normal rate and rhythm, no murmur noted Neurological examination  Mentation: Alert oriented to time, place, history taking. Follows most commands speech and language fluent Cranial nerve II-XII: Pupils were equal round reactive to light. Extraocular movements were full, visual field were full on confrontational test. Facial sensation and strength were normal. Uvula tongue midline. Head turning and shoulder shrug  were normal and symmetric. Motor: The motor testing reveals 5 over 5 strength of all 4 extremities. Good symmetric motor tone is noted throughout.  Sensory: Sensory testing is intact to soft touch on all 4 extremities. No evidence of extinction is noted.  Coordination: Cerebellar testing reveals good finger-nose-finger and  heel-to-shin bilaterally.  Gait and station: Gait is normal.  Reflexes: Deep tendon reflexes are symmetric and normal bilaterally.   DIAGNOSTIC DATA (LABS, IMAGING, TESTING) - I reviewed patient records, labs, notes, testing and imaging myself where available.  MMSE - Mini Mental State Exam 07/10/2018 11/20/2017 01/27/2016  Not completed: (No Data) - -  Orientation to time 1 5 4   Orientation to Place 3 5 4   Registration 3 3 3   Attention/ Calculation 1 3 2   Recall 2 2 2   Language- name 2 objects 2 2 2   Language- repeat 1 1 1   Language- follow 3 step command 2 3 3   Language- read & follow direction 1 1 1   Write a sentence 1 1 1   Copy design 0 0 0  Total score 17 26 23      Lab Results  Component Value Date   WBC 5.1  11/20/2017   HGB 13.2 11/20/2017   HCT 38.6 11/20/2017   MCV 93 11/20/2017   PLT 279 11/20/2017      Component Value Date/Time   NA 145 (H) 11/20/2017 1113   K 4.2 11/20/2017 1113   CL 107 (H) 11/20/2017 1113   CO2 23 11/20/2017 1113   GLUCOSE 91 11/20/2017 1113   GLUCOSE 120 (H) 08/06/2017 1810   BUN 12 11/20/2017 1113   CREATININE 0.79 11/20/2017 1113   CALCIUM 9.6 11/20/2017 1113   PROT 6.8 11/20/2017 1113   ALBUMIN 4.6 11/20/2017 1113   AST 12 11/20/2017 1113   ALT 9 11/20/2017 1113   ALKPHOS 76 11/20/2017 1113   BILITOT 0.7 11/20/2017 1113   GFRNONAA 69 11/20/2017 1113   GFRAA 79 11/20/2017 1113   Lab Results  Component Value Date   CHOL 237 (H) 10/10/2016   HDL 62 10/10/2016   LDLCALC 158 (H) 10/10/2016   TRIG 85 10/10/2016   CHOLHDL 3.8 10/10/2016   Lab Results  Component Value Date   HGBA1C 5.7 (H) 10/10/2016   Lab Results  Component Value Date   VITAMINB12 193 (L) 11/20/2017   Lab Results  Component Value Date   TSH 2.300 11/20/2017       ASSESSMENT AND PLAN 85 y.o. year old female  has a past medical history of Actinic keratosis, Allergic rhinitis, Aneurysm ([place]), Arthritis, Basal cell carcinoma, Bursitis, CAD (coronary artery disease), autologous vein bypass graft (March 2008), CAD in native artery (2005, 03/2014), Cancer [facility]), Cataract, Dyslipidemia, Glaucoma, History of kidney stones, Hypertension, Non-STEMI (non-ST elevated myocardial infarction) [facility]) (October 2005), Osteoarthritis, PONV (postoperative nausea and vomiting), S/P CABG x 3 (October 2005), Statin intolerance, and Stroke ([place]). here with     ICD-10-CM   1. Dementia without behavioral disturbance, unspecified dementia type ([place]) F03.90   2. Memory loss R41.3   3. Vitamin B12 deficiency E53.8 cyanocobalamin ((VITAMIN B-12)) injection 1,000 mcg    Mrs Marion continues to do fairly well from a physical standpoint.  Unfortunately her granddaughter has noticed continued cognitive  decline.  We will start Aricept 5 mg at bedtime.  Her granddaughter was instructed that this dose may be increased to 10 mg if tolerated well.  We have had a long discussion regarding prognosis and progression of disease.  I have advised that her granddaughter start dosing medications and administering to Mrs Tallent.  I have provided caregiver resources via AVS as well as a dementia packet in the office.  We will continue to monitor hallucinations for now.  We have discussed adding Celexa or Lexapro in the future.  She  may discuss this with her primary care provider as well considering her cardiac history.  We will follow-up with her in 6 months.   No orders of the defined types were placed in this encounter.    Meds ordered this encounter  Medications  . donepezil (ARICEPT) 5 MG tablet    Sig: Take 1 tablet (5 mg total) by mouth at bedtime.    Dispense:  90 tablet    Refill:  3    Order Specific Question:   Supervising Provider    Answer:   Melvenia Beam [id number]  . cyanocobalamin ((VITAMIN B-12)) injection 1,000 mcg      I spent 15 minutes with the patient. 50% of this time was spent counseling and educating patient on plan of care and medications.    Debbora Presto, FNP-C 07/11/2018, 11:58 AM [facility] [address] [place], [place] [address] [phone]

## 2018-07-10 ENCOUNTER — Other Ambulatory Visit: Payer: Self-pay

## 2018-07-10 ENCOUNTER — Ambulatory Visit (INDEPENDENT_AMBULATORY_CARE_PROVIDER_SITE_OTHER): Payer: Medicare Other | Admitting: Family Medicine

## 2018-07-10 ENCOUNTER — Encounter: Payer: Self-pay | Admitting: Family Medicine

## 2018-07-10 VITALS — BP 166/79 | HR 68 | Ht 66.0 in | Wt 117.0 lb

## 2018-07-10 DIAGNOSIS — F0390 Unspecified dementia without behavioral disturbance: Secondary | ICD-10-CM | POA: Diagnosis not present

## 2018-07-10 DIAGNOSIS — E538 Deficiency of other specified B group vitamins: Secondary | ICD-10-CM | POA: Diagnosis not present

## 2018-07-10 DIAGNOSIS — R413 Other amnesia: Secondary | ICD-10-CM

## 2018-07-10 MED ORDER — CYANOCOBALAMIN 1000 MCG/ML IJ SOLN
1000.0000 ug | Freq: Once | INTRAMUSCULAR | Status: AC
  Administered 2018-07-10: 1000 ug via INTRAMUSCULAR

## 2018-07-10 MED ORDER — DONEPEZIL HCL 5 MG PO TABS: 5.0000 mg | ORAL_TABLET | Freq: Every day | ORAL | 3 refills | Status: DC

## 2018-07-10 NOTE — Patient Instructions (Addendum)
Start Aricept 5mg  at night (may increase to 10mg  every night after 2 weeks if well tolerated)  Caution with leaving alone and medications.   Dementia Caregiver Guide Dementia is a term used to describe a number of symptoms that affect memory and thinking. The most common symptoms include:  Memory loss.  Trouble with language and communication.  Trouble concentrating.  Poor judgment.  Problems with reasoning.  Child-like behavior and language.  Extreme anxiety.  Angry outbursts.  Wandering from home or public places. Dementia usually gets worse slowly over time. In the early stages, people with dementia can stay independent and safe with some help. In later stages, they need help with daily tasks such as dressing, grooming, and using the bathroom. How to help the person with dementia cope Dementia can be frightening and confusing. Here are some tips to help the person with dementia cope with changes caused by the disease. General tips  Keep the person on track with his or her routine.  Try to identify areas where the person may need help.  Be supportive, patient, calm, and encouraging.  Gently remind the person that adjusting to changes takes time.  Help with the tasks that the person has asked for help with.  Keep the person involved in daily tasks and decisions as much as possible.  Encourage conversation, but try not to get frustrated or harried if the person struggles to find words or does not seem to appreciate your help. Communication tips  When the person is talking or seems frustrated, make eye contact and hold the person's hand.  Ask specific questions that need yes or no answers.  Use simple words, short sentences, and a calm voice. Only give one direction at a time.  When offering choices, limit them to just 1 or 2.  Avoid correcting the person in a negative way.  If the person is struggling to find the right words, gently try to help him or her. How  to recognize symptoms of stress Symptoms of stress in caregivers include:  Feeling frustrated or angry with the person with dementia.  Denying that the person has dementia or that his or her symptoms will not improve.  Feeling hopeless and unappreciated.  Difficulty sleeping.  Difficulty concentrating.  Feeling anxious, irritable, or depressed.  Developing stress-related health problems.  Feeling like you have too little time for your own life. Follow these instructions at home:   Make sure that you and the person you are caring for: ? Get regular sleep. ? Exercise regularly. ? Eat regular, nutritious meals. ? Drink enough fluid to keep your urine clear or pale yellow. ? Take over-the-counter and prescription medicines only as told by your health care providers. ? Attend all scheduled health care appointments.  Join a support group with others who are caregivers.  Ask about respite care resources so that you can have a regular break from the stress of caregiving.  Look for signs of stress in yourself and in the person you are caring for. If you notice signs of stress, take steps to manage it.  Consider any safety risks and take steps to avoid them.  Organize medications in a pill box for each day of the week.  Create a plan to handle any legal or financial matters. Get legal or financial advice if needed.  Keep a calendar in a central location to remind the person of appointments or other activities. Tips for reducing the risk of injury  Keep floors clear of clutter.  Remove rugs, magazine racks, and floor lamps.  Keep hallways well lit, especially at night.  Put a handrail and nonslip mat in the bathtub or shower.  Put childproof locks on cabinets that contain dangerous items, such as medicines, alcohol, guns, toxic cleaning items, sharp tools or utensils, matches, and lighters.  Put the locks in places where the person cannot see or reach them easily. This will  help ensure that the person does not wander out of the house and get lost.  Be prepared for emergencies. Keep a list of emergency phone numbers and addresses in a convenient area.  Remove car keys and lock garage doors so that the person does not try to get in the car and drive.  Have the person wear a bracelet that tracks locations and identifies the person as having memory problems. This should be worn at all times for safety. Where to find support: Many individuals and organizations offer support. These include:  Support groups for people with dementia and for caregivers.  Counselors or therapists.  Home health care services.  Adult day care centers. Where to find more information [employer]: [employer] Contact a health care provider if:  The person's health is rapidly getting worse.  You are no longer able to care for the person.  Caring for the person is affecting your physical and emotional health.  The person threatens himself or herself, you, or anyone else. Summary  Dementia is a term used to describe a number of symptoms that affect memory and thinking.  Dementia usually gets worse slowly over time.  Take steps to reduce the person's risk of injury, and to plan for future care.  Caregivers need support, relief from caregiving, and time for their own lives. This information is not intended to replace advice given to you by your health care provider. Make sure you discuss any questions you have with your health care provider. Document Released: 03/21/2016 Document Revised: 03/21/2016 Document Reviewed: 03/21/2016 Elsevier Interactive Patient Education  2019 [employer].

## 2018-07-11 ENCOUNTER — Encounter: Payer: Self-pay | Admitting: Family Medicine

## 2018-07-11 ENCOUNTER — Encounter: Payer: Medicare Other | Admitting: Psychology

## 2018-07-11 NOTE — Progress Notes (Signed)
Made any corrections needed, and agree with history, physical, neuro exam,assessment and plan as stated.     Antonia Ahern, MD [facility]  

## 2018-08-29 IMAGING — MR MR HEAD W/O CM
9 of 10 series · 34 of 48 positions shown · non-contrast
Comparison: 03/02/2016 CT.  01/21/2016 MR.

CLINICAL DATA: 84-year-old hypertensive female. Trouble finding
words/ loss of vision. Subsequent encounter.

EXAM:
MRI HEAD WITHOUT CONTRAST
TECHNIQUE: Multiplanar, multiecho pulse sequences of the brain and surrounding
structures were obtained without intravenous contrast.

[Series 3: DWI · axial · 3.0mm · 0.94mm/px · z∈[-48,+93]mm · 8 of 97 slices shown (1 of 2)]
[im 1/97]
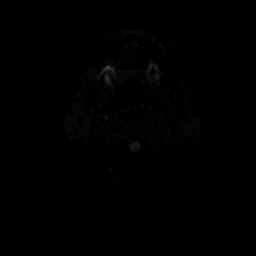
[im 11/97]
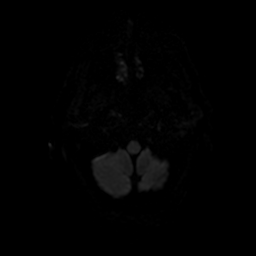
[im 33/97]
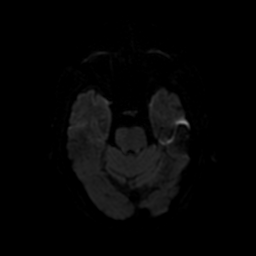
[im 43/97]
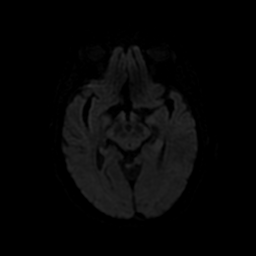
[im 54/97]
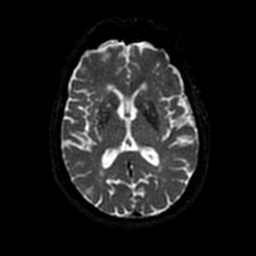
[im 65/97]
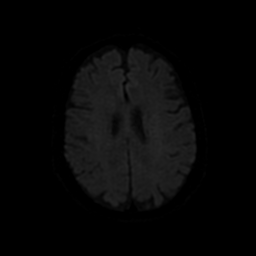
[im 86/97]
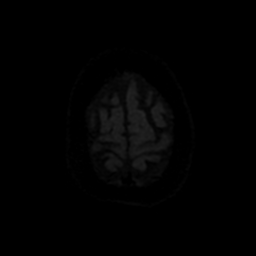
[im 97/97]
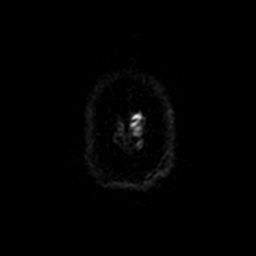

[Series 4: DWI · coronal · 4.0mm · 0.94mm/px · 7 of 70 slices shown (2 of 2)]
[im 1/70]
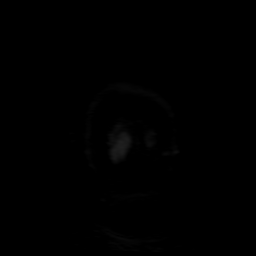
[im 12/70]
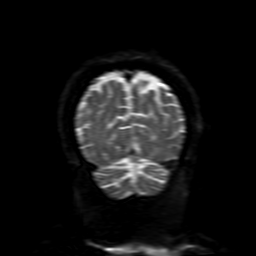
[im 24/70]
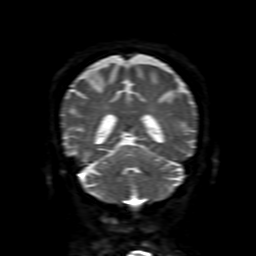
[im 35/70]
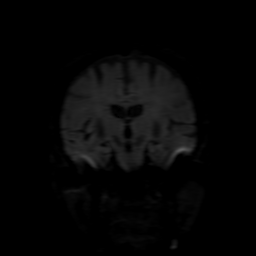
[im 47/70]
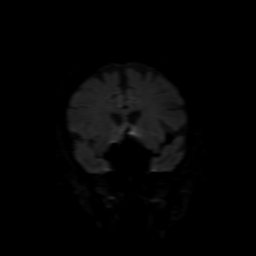
[im 58/70]
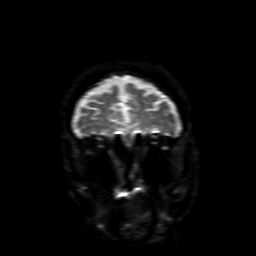
[im 70/70]
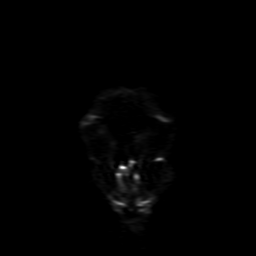

[Series 5: FLAIR · sagittal · 5.0mm · 0.47mm/px · 2 of 23 slices shown (1 of 2)]
[im 1/23]
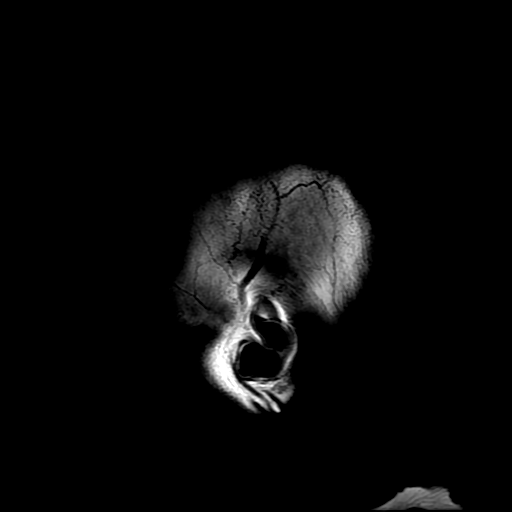
[im 23/23]
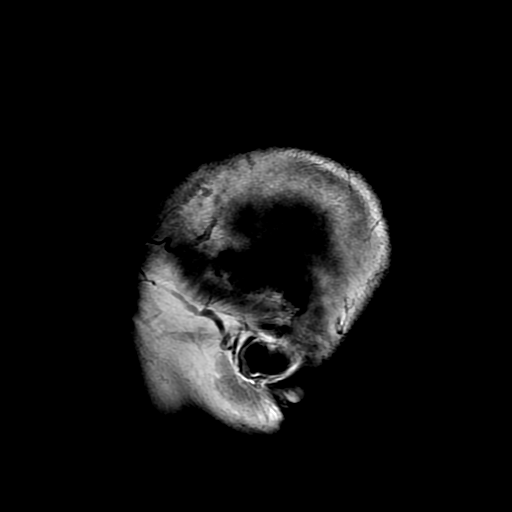

[Series 6: T2 · axial · 5.0mm · 0.47mm/px · z∈[-51,+90]mm · 2 of 25 slices shown (1 of 2)]
[im 1/25]
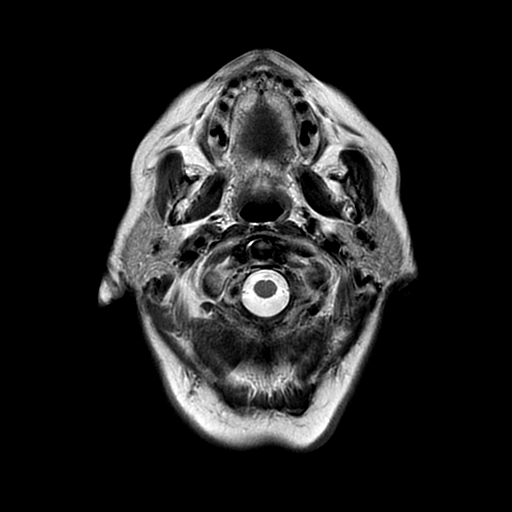
[im 25/25]
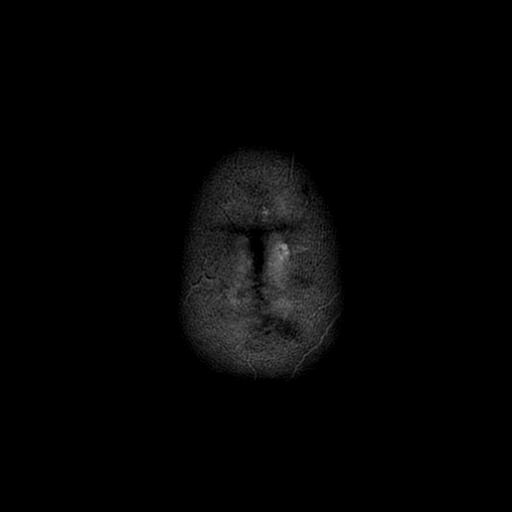

[Series 7: FLAIR · axial · 5.0mm · 0.47mm/px · z∈[-51,+90]mm · 2 of 25 slices shown (2 of 2)]
[im 1/25]
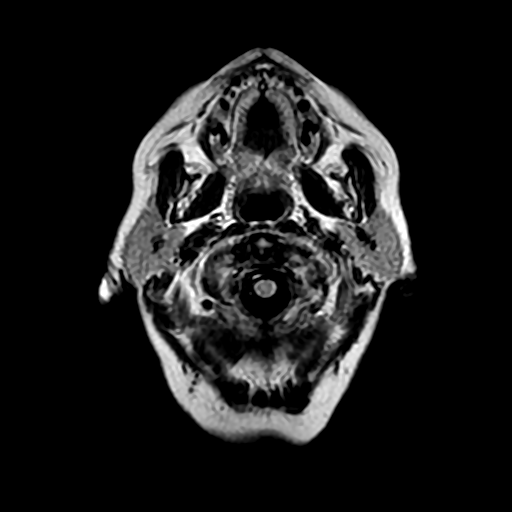
[im 25/25]
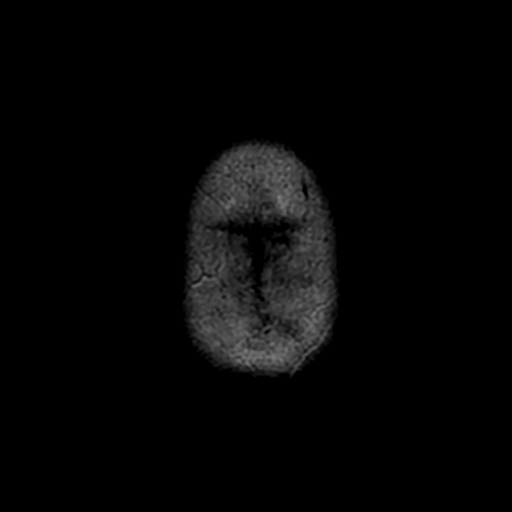

[Series 8: (person_name) · axial · 3.0mm · 0.47mm/px · z∈[-52,-4]mm · 3 of 100 slices shown]
[im 1/100]
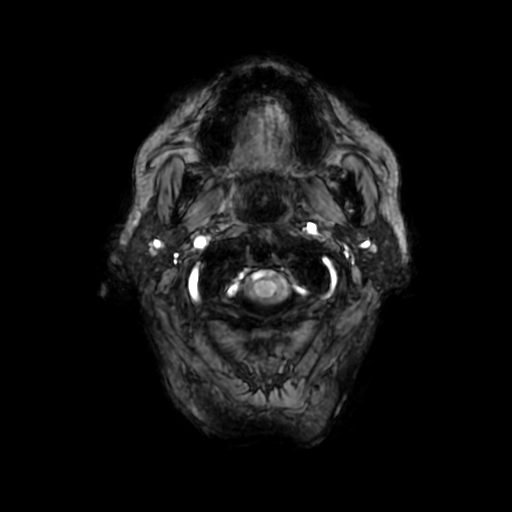
[im 12/100]
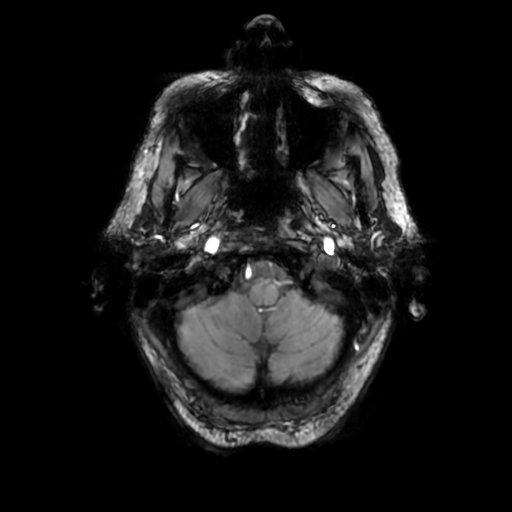
[im 34/100]
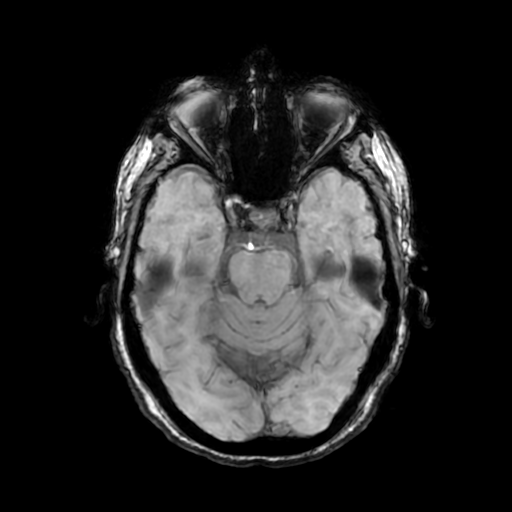

[Series 10: T2 · coronal · 5.0mm · 0.39mm/px · 3 of 29 slices shown (2 of 2)]
[im 1/29]
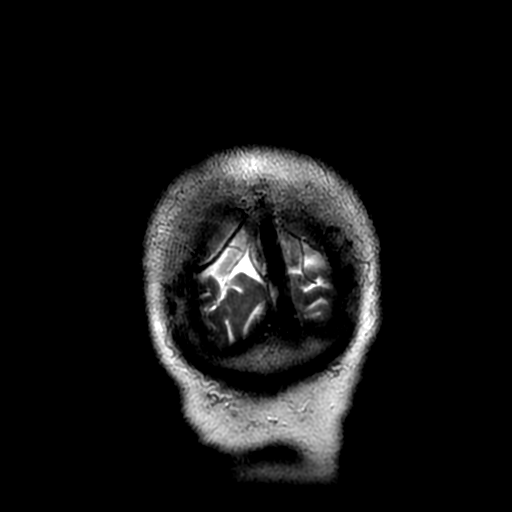
[im 15/29]
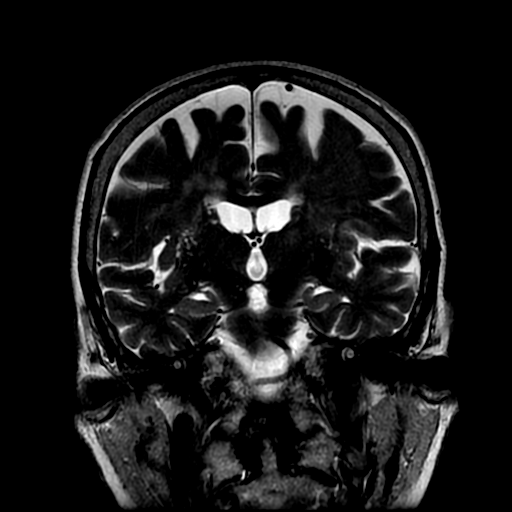
[im 29/29]
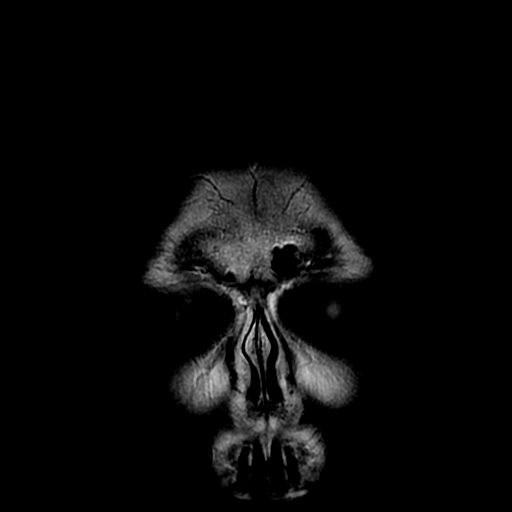

[Series 350: ADC · axial · 3.0mm · 0.94mm/px · z∈[-48,+93]mm · 4 of 45 slices shown (1 of 2)]
[im 1/45]
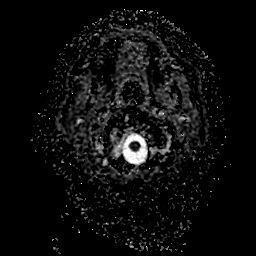
[im 15/45]
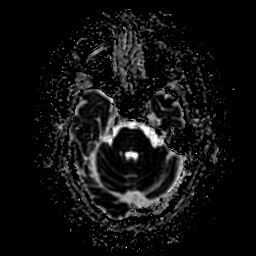
[im 30/45]
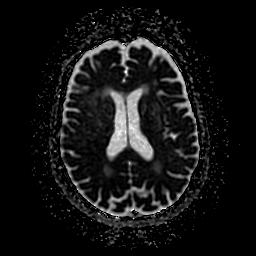
[im 45/45]
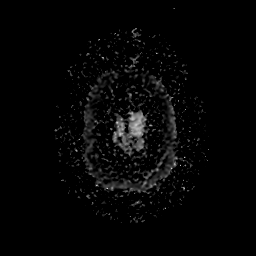

[Series 450: ADC · coronal · 4.0mm · 0.94mm/px · 3 of 35 slices shown (2 of 2)]
[im 1/35]
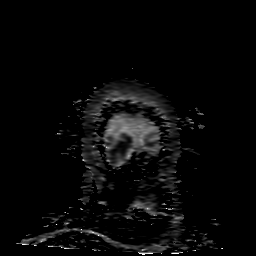
[im 18/35]
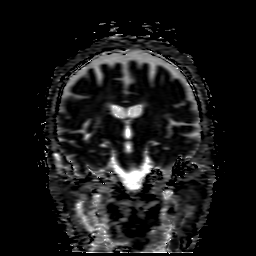
[im 35/35]
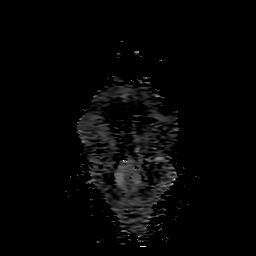

[34 of 48 positions shown; findings below may reference images not displayed]

FINDINGS: Brain: No acute infarct or intracranial hemorrhage.

Remote bilateral cerebellar infarcts.

Moderate chronic microvascular changes.

Mild global atrophy without hydrocephalus.

No intracranial mass lesion noted on this unenhanced exam.

Vascular: Major intracranial vascular structures are patent. MR
angiogram detected aneurysms not adequately assessed on present
exam.

Skull and upper cervical spine: Transverse ligament hypertrophy. No
cord compression.

Sinuses/Orbits: Post lens replacement. No acute orbital abnormality.

Minimal ethmoid sinus mucosal thickening.

Other: Negative
IMPRESSION: No acute infarct or intracranial hemorrhage.

Remote bilateral cerebellar infarcts.

Moderate chronic microvascular changes.

## 2019-01-30 ENCOUNTER — Other Ambulatory Visit: Payer: Self-pay | Admitting: Cardiology

## 2019-03-04 DIAGNOSIS — H401133 Primary open-angle glaucoma, bilateral, severe stage: Secondary | ICD-10-CM | POA: Diagnosis not present

## 2019-03-04 DIAGNOSIS — Z9889 Other specified postprocedural states: Secondary | ICD-10-CM | POA: Diagnosis not present

## 2019-03-04 DIAGNOSIS — Z961 Presence of intraocular lens: Secondary | ICD-10-CM | POA: Diagnosis not present

## 2019-03-04 DIAGNOSIS — H43813 Vitreous degeneration, bilateral: Secondary | ICD-10-CM | POA: Diagnosis not present

## 2019-03-19 ENCOUNTER — Other Ambulatory Visit: Payer: Self-pay | Admitting: Cardiology

## 2019-04-09 ENCOUNTER — Other Ambulatory Visit: Payer: Self-pay | Admitting: Cardiology

## 2019-04-09 MED ORDER — VITAMIN D3 50 MCG (2000 UT) PO CAPS: 2000.0000 [IU] | ORAL_CAPSULE | Freq: Every day | ORAL | 2 refills | Status: DC

## 2019-04-09 NOTE — Telephone Encounter (Signed)
°*  STAT* If patient is at the pharmacy, call can be transferred to refill team.   1. Which medications need to be refilled? (please list name of each medication and dose if known) clopidogrel (PLAVIX) 75 MG tablet  2. Which pharmacy/location (including street and city if local pharmacy) is medication to be sent to?   [id number] - [place], [place] [address]   3. Do they need a 30 day or 90 day supply? 30  Patient has two doses of medicine left. She is scheduled for a  telemedicine visit  for 05/09/19

## 2019-04-09 NOTE — Telephone Encounter (Signed)
Rx(s) sent to pharmacy electronically. Patients granddaughter, [name], per dpr, notified directly.

## 2019-04-28 ENCOUNTER — Other Ambulatory Visit: Payer: Self-pay | Admitting: Cardiology

## 2019-04-28 MED ORDER — CLOPIDOGREL BISULFATE 75 MG PO TABS: 75.0000 mg | ORAL_TABLET | Freq: Every day | ORAL | 0 refills | Status: DC

## 2019-04-28 NOTE — Telephone Encounter (Signed)
   Granddaughter calling, states patient has been out of medication.     1. Which medications need to be refilled? (please list name of each medication and dose if known) clopidogrel (PLAVIX) 75 MG tablet  2. Which pharmacy/location (including street and city if local pharmacy) is [facility]  3. Do they need a 30 day or 90 day supply? [place]

## 2019-05-09 ENCOUNTER — Encounter: Payer: Self-pay | Admitting: Cardiology

## 2019-05-09 ENCOUNTER — Other Ambulatory Visit: Payer: Self-pay

## 2019-05-09 ENCOUNTER — Ambulatory Visit (INDEPENDENT_AMBULATORY_CARE_PROVIDER_SITE_OTHER): Payer: Medicare Other | Admitting: Cardiology

## 2019-05-09 VITALS — BP 188/84 | HR 70 | Temp 98.4°F | Ht 67.0 in | Wt 120.0 lb

## 2019-05-09 DIAGNOSIS — I214 Non-ST elevation (NSTEMI) myocardial infarction: Secondary | ICD-10-CM | POA: Diagnosis not present

## 2019-05-09 DIAGNOSIS — G459 Transient cerebral ischemic attack, unspecified: Secondary | ICD-10-CM

## 2019-05-09 DIAGNOSIS — I25810 Atherosclerosis of coronary artery bypass graft(s) without angina pectoris: Secondary | ICD-10-CM

## 2019-05-09 DIAGNOSIS — E785 Hyperlipidemia, unspecified: Secondary | ICD-10-CM

## 2019-05-09 DIAGNOSIS — I10 Essential (primary) hypertension: Secondary | ICD-10-CM | POA: Diagnosis not present

## 2019-05-09 DIAGNOSIS — R413 Other amnesia: Secondary | ICD-10-CM

## 2019-05-09 NOTE — Patient Instructions (Addendum)
Medication Instructions:  NO CHANGES  TAKE ATENOLOL AND LISINOPRIL  SEPARATELY   TAKE ONE AT DINNER  LISINOPRIL   AND THE OTHER  AT BEDTIME  ( ATENOLOL)  *If you need a refill on your cardiac medications before your next appointment, please call your pharmacy*  Lab Work: NOT NEEDED   Testing/Procedures: NOT NEEDED  Follow-Up: At [facility], you and your health needs are our priority.  As part of our continuing mission to provide you with exceptional heart care, we have created designated Provider Care Teams.  These Care Teams include your primary Cardiologist (physician) and Advanced Practice Providers (APPs -  Physician Assistants and Nurse Practitioners) who all work together to provide you with the care you need, when you need it.  Your next appointment:   12 month(s)  The format for your next appointment:   In Person  Provider:   Glenetta Hew, MD  Other Instructions  EAT ANYTHING YOU WANT -- HOT CHOCOLATE MILKSHAKES , CAKES  COOKIES , STEAK   WALK AS MUCH AS POSSIBLE OUTSIDE- KEEP  MOVING -" GET FRESH AIR"

## 2019-05-09 NOTE — Progress Notes (Signed)
Primary Care Provider: Carol Ada, MD Cardiologist: No primary care provider on file. Electrophysiologist:   Clinic Note: Chief Complaint  Patient presents with  . Follow-up    Delayed 2-year (was supposed be 1 year,)  . Coronary Artery Disease    HPI:    [name] is a 86 y.o. female with a complicated past medical history noted below who presents for delayed 2-year follow-up.  PMH:  CAD-CABG dating back to October 2005 (had CABG x3, follow-up cath showed both vein grafts to the diagonal and RI both occluded as the native vessels appeared to be patent.  Competitive flow also noted in LIMA-LAD).    labile hypertension with significant orthostatic hypotension.    History of  cryptogenic stroke/TIA with a loop recorder and has documented history of cerebral aneurysm treated by embolization back in December 2017. = She also has relatively recent diagnosis of dementia, possible Lewy body dementia with hallucinations.  [name] was last seen back in November 2018.It was noted that day that she was relatively poor historian.  She never did switch to carvedilol as per hospital discharge recommendations.  She stayed on atenolol.  Somewhat reduced dose.  No chest tightness or pressure.  Labile blood pressures.  Recent Hospitalizations: None  Reviewed  CV studies:    The following studies were reviewed today: (if available, images/films reviewed: From Epic Chart or Care Everywhere) . None:   Interval History:   [name] presents here today for a delayed follow-up visit.  She is accompanied by her granddaughter who is one of her caregivers.  Besides having some issues with her eyes being somewhat bloodshot today from eyedrops, she seems to be doing pretty well overall for cardiac standpoint.  She is very deconditioned and has definitely weakened since I last saw her.  She has very poor balance, but is reluctant to use a walker.  She will use her cane off and  on.  She does not get around the house a little bit, and with what she does, she denies any chest pain or pressure with rest or exertion.  Just generalized fatigue.  Her blood pressure is high today, but she is somewhat anxious.  She has not had any recent falls but does have bad balance.  No headache or blurred vision.  CV Review of Symptoms (Summary) no chest pain or dyspnea on exertion positive for - Poor balance, unsteady gait and exertional fatigue.  Labile blood pressure. negative for - edema, irregular heartbeat, orthopnea, palpitations, paroxysmal nocturnal dyspnea, rapid heart rate, shortness of breath or Syncope/near syncope, TIA/amaurosis fugax.  Does not really walk enough to notice claudication  The patient does not have symptoms concerning for COVID-19 infection (fever, chills, cough, or new shortness of breath).  The patient is practicing social distancing and masking, but really is not out about very much.  Stays at home mostly.  Has not been out pretty much the entire year of [place]   A comprehensive ROS was performed. Review of Systems  Constitutional: Positive for malaise/fatigue. Negative for weight loss.  HENT: Negative for congestion and nosebleeds.   Eyes: Positive for blurred vision and redness.  Respiratory: Negative for cough, sputum production and wheezing.   Gastrointestinal: Negative for blood in stool and melena.  Genitourinary: Negative for hematuria.  Musculoskeletal: Positive for joint pain.  Neurological: Positive for dizziness (Positional), weakness (Legs) and headaches. Negative for focal weakness.  Psychiatric/Behavioral: Positive for hallucinations (Visual and  auditory) and memory loss. Negative for depression. The patient is nervous/anxious and has insomnia.    I have reviewed and (if needed) personally updated the patient's problem list, medications, allergies, past medical and surgical history, social and family history.   PAST  MEDICAL HISTORY   Past Medical History:  Diagnosis Date  . Actinic keratosis   . Allergic rhinitis   . Aneurysm ([place])   . Arthritis    osteoarthritis, back, hands, wrists  . Basal cell carcinoma   . Bursitis    hips bilat   . CAD (coronary artery disease), autologous vein bypass graft March 2008   Follow-up cath: December 2015 Occluded SVG-D1 and occluded SVG-RI.  [name] CAD in native artery 2005, 03/2014   a.  Severe disease noted in LAD & RI --> referred for CABG x3; b.  Follow-up cath for abnormal Myoview: Occluded SVG-RI and SVG-D1 with patent LIMA-LAD and competitive flow.  60-70% LAD and RI lesions.  Otherwise minimal disease.  EF 70%.  . Cancer ([place])    + basal cell- on leg  . Cataract   . Dyslipidemia    Statin intolerant  . Glaucoma   . History of kidney stones   . Hypertension   . Non-STEMI (non-ST elevated myocardial infarction) [facility]) October 2005   EF by 35-40%, echo 40-50%. Angiography: 99% mid LAD involving D1 followed by 70% mid LAD; 80% RI. --> CABG  . Osteoarthritis   . PONV (postoperative nausea and vomiting)   . S/P CABG x 01 February 2004   LIMA-LAD, SVG-D1, SVG-RI.  [name] Statin intolerance   . Stroke [facility])      PAST SURGICAL HISTORY   Past Surgical History:  Procedure Laterality Date  . BUNIONECTOMY     2002  . CARDIOVASCULAR STRESS TEST  05/23/2006   Mild lateral/inferolateral ischemia, likely due to occluded SVGs with exhisting disease.  [name] CAROTID DOPPLER  07/15/2009   Bilat ICAs 0-49% diameter reduction. Normal patency of Bilat subclavian arteries.  . CORONARY ARTERY BYPASS GRAFT  02/22/2004   x3. LIMA to distal LAD, SVG to first diag, SVG to ramus. SVG harvest from rt thigh.  . EP IMPLANTABLE DEVICE N/A 03/02/2016   Procedure: Loop Recorder Insertion;  Surgeon: Thompson Grayer, MD;  Location: [place] [facility];  Service: Cardiovascular;  Laterality: N/A;  . EXCISION/RELEASE BURSA HIP Right 06/15/2015   Procedure: RIGHT HIP BURSECTOMY WITH GLUTEAL TENDON  REPAIR;  Surgeon: Gaynelle Arabian, MD;  Location: [facility];  Service: Orthopedics;  Laterality: Right;  . EYE SURGERY     cataract surgery bilat   . IR ANGIO INTRA EXTRACRAN SEL COM CAROTID INNOMINATE BILAT MOD SED  08/07/2016  . IR ANGIO VERTEBRAL SEL SUBCLAVIAN INNOMINATE BILAT MOD SED  08/07/2016  . IR GENERIC HISTORICAL  06/01/2016   IR RADIOLOGIST EVAL & MGMT 06/01/2016 [facility]-INTER[facility]  . IR RADIOLOGIST EVAL & MGMT  05/07/2017  . LEFT HEART CATHETERIZATION WITH CORONARY ANGIOGRAM  02/19/2004   Significant 2 vessel CAD - LAD, D1 and RI  . LEFT HEART CATHETERIZATION WITH CORONARY/GRAFT ANGIOGRAM N/A 04/28/2014   Procedure: LEFT HEART CATHETERIZATION WITH Beatrix Fetters;  Surgeon: Sinclair Grooms, MD;  Location: [facility] CATH LAB: CTO of SVG-Diag & SVG-RI, patent LIMA-LAD. Patent native circumflex, LAD and RCA. 70% stenosis in a branch of RI. --> Does not explain "high risk perfusion study "  . LEFT HEART CATHETERIZATION WITH CORONARY/GRAFT ANGIOGRAM   07/25/2006   Totally occluded SVG to diag and ramus. Patent LIMA-LAD. Ramus  70-80% proximal stenosis and occluded vein graft.  [name] left total knee replacement      2001  . NM MYOVIEW LTD  January 2008; December 2015   a. Referred for For mild inferolateral and anterolateral/apical lateral defect with mild reversibility.;; b. Large defect in the anterior and inferior wall. Suggestive of potential infarct plus ischemia. HIGH RISK.  [name] RADIOLOGY WITH ANESTHESIA N/A 08/07/2016   Procedure: EMBOLIZATION;  Surgeon: Luanne Bras, MD;  Location: [place];  Service: Radiology;  Laterality: N/A;  . right total knee replacement      2003  . TRANSTHORACIC ECHOCARDIOGRAM  02/19/2004; December 2015   a. EF 45-50%, Normal LV function, moderate hypokinesis of anterior wall.;; b. EF 50-55%. No RWMA, GR 1 DD. Normal valves      MEDICATIONS/ALLERGIES   Current Meds  Medication Sig  . acetaminophen (TYLENOL) 500 MG tablet Take 500 mg by mouth every 4 (four) hours as  needed (for pain).   [name] aspirin EC 81 MG tablet Take 81 mg by mouth at bedtime.   [name] atenolol (TENORMIN) 25 MG tablet Take 1 tablet (25 mg total) daily by mouth. May take an additional 25 mg of medication daily, if SBP> 160 or DBP >100 (Patient taking differently: Take 25 mg by mouth at bedtime. May take an additional 25 mg of medication daily, if SBP> 160 or DBP >100)  . brimonidine (ALPHAGAN P) 0.1 % SOLN Place 1 drop into both eyes 2 (two) times daily.   . brinzolamide (AZOPT) 1 % ophthalmic suspension Place 1 drop into both eyes 2 (two) times daily.   . Cholecalciferol (VITAMIN D3) 50 MCG (2000 UT) capsule Take 1 capsule (2,000 Units total) by mouth at bedtime.  . clopidogrel (PLAVIX) 75 MG tablet Take 1 tablet (75 mg total) by mouth daily. KEEP OV.  [name] donepezil (ARICEPT) 5 MG tablet Take 1 tablet (5 mg total) by mouth at bedtime.  . dorzolamide (TRUSOPT) 2 % ophthalmic solution Place 1 drop into both eyes 2 (two) times daily.  . dorzolamide-timolol (COSOPT) 22.3-6.8 MG/ML ophthalmic solution INSTILL 1 DROP INTO LEFT EYE TWICE A DAY  . latanoprost (XALATAN) 0.005 % ophthalmic solution Place 1 drop into both eyes at bedtime.  [name] lisinopril (PRINIVIL,ZESTRIL) 20 MG tablet Take 20 mg by mouth at bedtime.   . timolol (BETIMOL) 0.5 % ophthalmic solution Place 1 drop into both eyes daily.     Allergies  Allergen Reactions  . Nsaids Other (See Comments)    URINARY RETENTION  . Statins Other (See Comments)    MYALGIAS HURTS ALL OVER  . Tricor [Fenofibrate] Other (See Comments)    MYALGIAS HURTS ALL OVER  . Zetia [Ezetimibe] Other (See Comments)    MYALGIAS HURTS ALL OVER  . Lescol [Fluvastatin]     Muscle pain  . Livalo [Pitavastatin]     Muscle pain  . Niaspan [Niacin]     Severe flushing   . Relafen [Nabumetone] Rash     SOCIAL HISTORY/FAMILY HISTORY   Social History   Tobacco Use  . Smoking status: Never Smoker  . Smokeless tobacco: Never Used  Substance Use Topics  .  Alcohol use: Not Currently    Alcohol/week: 1.0 standard drinks    Types: 1 Standard drinks or equivalent per week    Comment: 1 glass of liquor weekly   . Drug use: No   Social History   Social History Narrative   She is a widowed, mother of 2 grandmother of 3.  Lives with granddaughter -[name]   Drinks coffee every morning. 1 cup.   She used to do exercising through the [employer] program as well as the [employer]. Unfortunately, this finding of the classes for seniors changed, and she was unable to make the new classes. She did not like exercising with non-seniors. Besides that she does existing disease, walks up and down the stairs, walk her dog. She is not as active as he had been before.   She never smoked, never drank alcohol.       Family History family history includes Alzheimer's disease in her mother and sister; Diabetes in her sister; Hyperlipidemia in her sister and sister; Hypertension in her sister and sister; Pancreatitis in her child.   OBJCTIVE -PE, EKG, labs   Wt Readings from Last 3 Encounters:  05/09/19 120 lb (54.4 kg)  07/10/18 117 lb (53.1 kg)  11/20/17 123 lb (55.8 kg)    Physical Exam: BP (!) 188/84   Pulse 70   Temp 98.4 F (36.9 C)   Ht 5\' 7"  (1.702 m)   Wt 120 lb (54.4 kg)   SpO2 99%   BMI 18.79 kg/m  Physical Exam  Constitutional: She is oriented to person, place, and time. No distress.  Very pleasant elderly woman.  Thin and frail, but not acutely ill-appearing.  HENT:  Head: Normocephalic and atraumatic.  Eyes:  Bilateral eyes have injected conjunctive a.  Neck: No hepatojugular reflux and no JVD present. Carotid bruit is not present.  Cardiovascular: Normal rate, regular rhythm and normal heart sounds.  No extrasystoles are present. PMI is not displaced. Exam reveals decreased pulses (Palpable). Exam reveals no gallop.  No murmur heard. Pulmonary/Chest: Effort normal and breath sounds normal. No respiratory distress. She has no  wheezes. She has no rales.  Musculoskeletal:        General: No edema (Trivial). Normal range of motion.     Cervical back: Normal range of motion and neck supple.  Neurological: She is alert and oriented to person, place, and time.  Psychiatric: She has a normal mood and affect. Her behavior is normal. Judgment and thought content normal.  Actually seems to be quite lucid.  Poor memory though.  Vitals reviewed.   Adult ECG Report  Rate: 70 ;  Rhythm: normal sinus rhythm and Normal axis, intervals and durations.;   Narrative Interpretation: Normal EKG  Recent Labs:    Lab Results  Component Value Date   CHOL 237 (H) 10/10/2016   HDL 62 10/10/2016   LDLCALC 158 (H) 10/10/2016   TRIG 85 10/10/2016   CHOLHDL 3.8 10/10/2016   Lab Results  Component Value Date   CREATININE 0.79 11/20/2017   BUN 12 11/20/2017   NA 145 (H) 11/20/2017   K 4.2 11/20/2017   CL 107 (H) 11/20/2017   CO2 23 11/20/2017    ASSESSMENT/PLAN    Problem List Items Addressed This Visit    Essential hypertension (Chronic)    Still has labile blood pressure and I am really reluctant to be overly aggressive with it.  Pressures range from the 120s to 180s.  We will continue current dose of lisinopril, but take it at dinnertime, and change atenolol to nighttime/nightly.  I think what we are seeing here today is that she is due to take her blood pressure medications within an hour of now and she may just not be giving full 24-hour coverage taking them both at dinnertime..  Continue to intermittently check blood pressures and  check them if she is having symptoms of headache or dizziness or dyspnea.  If systolic pressures are greater than 180 mmHg, she will take additional dose of atenolol.      Non-STEMI (non-ST elevated myocardial infarction) ([name]) (Chronic)    History of non-STEMI.  No further anginal heart failure symptoms.      Relevant Orders   EKG 12-Lead   CAD (coronary artery disease), autologous vein  bypass graft - Primary (Chronic)    Both vein grafts occluded.  Better flow noted in the LAD.  No active angina.  Would not do another stress test given her age and dementia.  She is on a beta-blocker, not insurance changes.  Is on aspirin and ACE inhibitor. With her age and dementia, would not recommend statin.  We had a frank discussion and she is fine with the potential risks of what happens if she does not treat her lipids.      Hyperlipidemia LDL goal <70 (Chronic)    After discussion today, we made a conscious effort to hold off on any further treatment.   Too complicated for her to try something like Repatha or Praluent.      TIA (transient ischemic attack)    Nothing is showing up concerning on her loop recorder.      Memory loss    Would not start statin for fear of potentially complicating memory issues.         COVID-19 Education: The signs and symptoms of COVID-19 were discussed with the patient and how to seek care for testing (follow up with PCP or arrange E-visit).   The importance of social distancing was discussed today.  I spent a total of 66minutes with the patient and chart review. >  50% of the time was spent in direct patient consultation.  Additional time spent with chart review (studies, outside notes, etc): 6 Total Time: 45min   Current medicines are reviewed at length with the patient today.  (+/- concerns) none   Patient Instructions / Medication Changes & Studies & Tests Ordered   Patient Instructions  Medication Instructions:  NO CHANGES  TAKE ATENOLOL AND LISINOPRIL  SEPARATELY   TAKE ONE AT DINNER  LISINOPRIL   AND THE OTHER  AT BEDTIME  ( ATENOLOL)  *If you need a refill on your cardiac medications before your next appointment, please call your pharmacy*  Lab Work: NOT NEEDED   Testing/Procedures: NOT NEEDED  Follow-Up: At [facility], you and your health needs are our priority.  As part of our continuing mission to provide you  with exceptional heart care, we have created designated Provider Care Teams.  These Care Teams include your primary Cardiologist (physician) and Advanced Practice Providers (APPs -  Physician Assistants and Nurse Practitioners) who all work together to provide you with the care you need, when you need it.  Your next appointment:   12 month(s)  The format for your next appointment:   In Person  Provider:   Glenetta Hew, MD  Other Instructions  EAT ANYTHING YOU WANT -- HOT CHOCOLATE MILKSHAKES , CAKES  COOKIES , STEAK   WALK AS MUCH AS POSSIBLE OUTSIDE- KEEP  MOVING -" GET FRESH AIR"    Studies Ordered:   Orders Placed This Encounter  Procedures  . EKG 12-Lead     Glenetta Hew, M.D., M.S. Interventional Cardiologist   Pager # [phone] Phone # [phone] [address] [place], [place] [address]   Thank you for choosing [facility]!!

## 2019-05-11 ENCOUNTER — Encounter: Payer: Self-pay | Admitting: Cardiology

## 2019-05-11 NOTE — Assessment & Plan Note (Signed)
History of non-STEMI.  No further anginal heart failure symptoms.

## 2019-05-11 NOTE — Assessment & Plan Note (Signed)
Still has labile blood pressure and I am really reluctant to be overly aggressive with it.  Pressures range from the 120s to 180s.  We will continue current dose of lisinopril, but take it at dinnertime, and change atenolol to nighttime/nightly.  I think what we are seeing here today is that she is due to take her blood pressure medications within an hour of now and she may just not be giving full 24-hour coverage taking them both at dinnertime..  Continue to intermittently check blood pressures and check them if she is having symptoms of headache or dizziness or dyspnea.  If systolic pressures are greater than 180 mmHg, she will take additional dose of atenolol.

## 2019-05-11 NOTE — Assessment & Plan Note (Signed)
After discussion today, we made a conscious effort to hold off on any further treatment.   Too complicated for her to try something like Repatha or Praluent.

## 2019-05-11 NOTE — Assessment & Plan Note (Signed)
Nothing is showing up concerning on her loop recorder.

## 2019-05-11 NOTE — Assessment & Plan Note (Signed)
Both vein grafts occluded.  Better flow noted in the LAD.  No active angina.  Would not do another stress test given her age and dementia.  She is on a beta-blocker, not insurance changes.  Is on aspirin and ACE inhibitor. With her age and dementia, would not recommend statin.  We had a frank discussion and she is fine with the potential risks of what happens if she does not treat her lipids.

## 2019-05-11 NOTE — Assessment & Plan Note (Signed)
Would not start statin for fear of potentially complicating memory issues.

## 2019-05-26 ENCOUNTER — Telehealth: Payer: Self-pay | Admitting: Cardiology

## 2019-05-26 MED ORDER — CLOPIDOGREL BISULFATE 75 MG PO TABS: 75.0000 mg | ORAL_TABLET | Freq: Every day | ORAL | 3 refills | Status: AC

## 2019-05-26 NOTE — Telephone Encounter (Signed)
Pt c/o medication issue:  1. Name of Medication: clopidogrel (PLAVIX) 75 MG tablet  2. How are you currently taking this medication (dosage and times per day)? As directed  3. Are you having a reaction (difficulty breathing--STAT)? No   4. What is your medication issue? Patients granddaughter calling in because patient was denied medication at [facility] due to needing authorization. Patient's granddaughter states that her mother just had her appt with Dr. Ellyn Hack on 05/09/19 and he made it seem like there would be no problem getting more refills.

## 2019-05-26 NOTE — Telephone Encounter (Signed)
Returned call to grandaughter (ok per DPR)-advised need rx sent to pharmacy.  She is aware and verbalized understanding.

## 2019-07-14 ENCOUNTER — Encounter (HOSPITAL_COMMUNITY): Payer: Self-pay | Admitting: Emergency Medicine

## 2019-07-14 ENCOUNTER — Emergency Department (HOSPITAL_COMMUNITY): Payer: Medicare Other

## 2019-07-14 ENCOUNTER — Emergency Department (HOSPITAL_COMMUNITY)
Admission: EM | Admit: 2019-07-14 | Discharge: 2019-07-14 | Disposition: A | Discharge status: Home / Self Care | Payer: Medicare Other | Source: Home / Self Care | Attending: Emergency Medicine | Admitting: Emergency Medicine

## 2019-07-14 ENCOUNTER — Other Ambulatory Visit: Payer: Self-pay

## 2019-07-14 DIAGNOSIS — M25559 Pain in unspecified hip: Secondary | ICD-10-CM | POA: Diagnosis not present

## 2019-07-14 DIAGNOSIS — M21372 Foot drop, left foot: Secondary | ICD-10-CM | POA: Insufficient documentation

## 2019-07-14 DIAGNOSIS — E785 Hyperlipidemia, unspecified: Secondary | ICD-10-CM | POA: Diagnosis not present

## 2019-07-14 DIAGNOSIS — Z79899 Other long term (current) drug therapy: Secondary | ICD-10-CM | POA: Insufficient documentation

## 2019-07-14 DIAGNOSIS — Z7982 Long term (current) use of aspirin: Secondary | ICD-10-CM | POA: Insufficient documentation

## 2019-07-14 DIAGNOSIS — S7002XA Contusion of left hip, initial encounter: Secondary | ICD-10-CM | POA: Diagnosis not present

## 2019-07-14 DIAGNOSIS — I10 Essential (primary) hypertension: Secondary | ICD-10-CM | POA: Diagnosis not present

## 2019-07-14 DIAGNOSIS — K8020 Calculus of gallbladder without cholecystitis without obstruction: Secondary | ICD-10-CM | POA: Diagnosis not present

## 2019-07-14 DIAGNOSIS — I2510 Atherosclerotic heart disease of native coronary artery without angina pectoris: Secondary | ICD-10-CM | POA: Diagnosis not present

## 2019-07-14 DIAGNOSIS — S79912A Unspecified injury of left hip, initial encounter: Secondary | ICD-10-CM | POA: Diagnosis not present

## 2019-07-14 DIAGNOSIS — R5381 Other malaise: Secondary | ICD-10-CM | POA: Diagnosis not present

## 2019-07-14 DIAGNOSIS — E876 Hypokalemia: Secondary | ICD-10-CM | POA: Diagnosis not present

## 2019-07-14 DIAGNOSIS — R52 Pain, unspecified: Secondary | ICD-10-CM

## 2019-07-14 DIAGNOSIS — Z03818 Encounter for observation for suspected exposure to other biological agents ruled out: Secondary | ICD-10-CM | POA: Diagnosis not present

## 2019-07-14 DIAGNOSIS — Z85828 Personal history of other malignant neoplasm of skin: Secondary | ICD-10-CM | POA: Insufficient documentation

## 2019-07-14 DIAGNOSIS — W19XXXA Unspecified fall, initial encounter: Secondary | ICD-10-CM | POA: Diagnosis not present

## 2019-07-14 DIAGNOSIS — S299XXA Unspecified injury of thorax, initial encounter: Secondary | ICD-10-CM | POA: Diagnosis not present

## 2019-07-14 DIAGNOSIS — I252 Old myocardial infarction: Secondary | ICD-10-CM | POA: Insufficient documentation

## 2019-07-14 DIAGNOSIS — Z7901 Long term (current) use of anticoagulants: Secondary | ICD-10-CM | POA: Insufficient documentation

## 2019-07-14 DIAGNOSIS — Z20822 Contact with and (suspected) exposure to covid-19: Secondary | ICD-10-CM | POA: Diagnosis not present

## 2019-07-14 DIAGNOSIS — S3992XA Unspecified injury of lower back, initial encounter: Secondary | ICD-10-CM | POA: Diagnosis not present

## 2019-07-14 DIAGNOSIS — Z951 Presence of aortocoronary bypass graft: Secondary | ICD-10-CM | POA: Insufficient documentation

## 2019-07-14 DIAGNOSIS — M545 Low back pain: Secondary | ICD-10-CM | POA: Insufficient documentation

## 2019-07-14 DIAGNOSIS — S0990XA Unspecified injury of head, initial encounter: Secondary | ICD-10-CM | POA: Diagnosis not present

## 2019-07-14 DIAGNOSIS — R531 Weakness: Secondary | ICD-10-CM

## 2019-07-14 LAB — URINALYSIS, ROUTINE W REFLEX MICROSCOPIC
Bilirubin Urine: NEGATIVE
Glucose, UA: NEGATIVE mg/dL
Ketones, ur: NEGATIVE mg/dL
Nitrite: NEGATIVE
Protein, ur: NEGATIVE mg/dL
Specific Gravity, Urine: 1.016 (ref 1.005–1.030)
pH: 5 (ref 5.0–8.0)

## 2019-07-14 LAB — COMPREHENSIVE METABOLIC PANEL
ALT: 9 U/L (ref 0–44)
AST: 15 U/L (ref 15–41)
Albumin: 3.6 g/dL (ref 3.5–5.0)
Alkaline Phosphatase: 74 U/L (ref 38–126)
Anion gap: 11 (ref 5–15)
BUN: 18 mg/dL (ref 8–23)
CO2: 24 mmol/L (ref 22–32)
Calcium: 9.4 mg/dL (ref 8.9–10.3)
Chloride: 108 mmol/L (ref 98–111)
Creatinine, Ser: 1 mg/dL (ref 0.44–1.00)
GFR calc Af Amer: 59 mL/min — ABNORMAL LOW (ref >60)
GFR calc non Af Amer: 51 mL/min — ABNORMAL LOW (ref >60)
Glucose, Bld: 115 mg/dL — ABNORMAL HIGH (ref 70–99)
Potassium: 3.5 mmol/L (ref 3.5–5.1)
Sodium: 143 mmol/L (ref 135–145)
Total Bilirubin: 0.7 mg/dL (ref 0.3–1.2)
Total Protein: 6 g/dL — ABNORMAL LOW (ref 6.5–8.1)

## 2019-07-14 LAB — CBC WITH DIFFERENTIAL/PLATELET
Abs Immature Granulocytes: 0.03 10*3/uL (ref 0.00–0.07)
Basophils Absolute: 0 10*3/uL (ref 0.0–0.1)
Basophils Relative: 0 %
Eosinophils Absolute: 0 10*3/uL (ref 0.0–0.5)
Eosinophils Relative: 0 %
HCT: 39.1 % (ref 36.0–46.0)
Hemoglobin: 12.8 g/dL (ref 12.0–15.0)
Immature Granulocytes: 0 %
Lymphocytes Relative: 14 %
Lymphs Abs: 1.3 10*3/uL (ref 0.7–4.0)
MCH: 31.1 pg (ref 26.0–34.0)
MCHC: 32.7 g/dL (ref 30.0–36.0)
MCV: 94.9 fL (ref 80.0–100.0)
Monocytes Absolute: 0.5 10*3/uL (ref 0.1–1.0)
Monocytes Relative: 5 %
Neutro Abs: 7.1 10*3/uL (ref 1.7–7.7)
Neutrophils Relative %: 81 %
Platelets: 201 10*3/uL (ref 150–400)
RBC: 4.12 MIL/uL (ref 3.87–5.11)
RDW: 12.7 % (ref 11.5–15.5)
WBC: 8.9 10*3/uL (ref 4.0–10.5)
nRBC: 0 % (ref 0.0–0.2)

## 2019-07-14 LAB — CK: Total CK: 192 U/L (ref 38–234)

## 2019-07-14 NOTE — ED Notes (Signed)
Patient transported to CT 

## 2019-07-14 NOTE — ED Provider Notes (Signed)
[facility] EMERGENCY DEPARTMENT Provider Note   CSN: [id number] Arrival date & time: 07/14/19  0753     History Chief Complaint  Patient presents with  . Level 2 Trauma    [name] is a 86 y.o. female.  86 yo F with a chief complaint of weakness.  The patient states that she tripped over a dog kennel that her granddaughter had left at her house.  She initially denies any injury but states that she has some pain to the buttock bilaterally worse on the left than the right.  She denies head injury denies loss of consciousness denies chest pain abdominal pain upper extremity pain.  She was unable to get off the ground and 911 was called.  When they got her up she was unable to walk under her own power and so brought her to the ED for evaluation.  States that she feels that she has been a little bit more weak than normal over the past week or so.  Felt that her gait is changed somewhat.  Normally walks while holding onto the walls.  Has a walker and a cane at home but chooses not to use them.  Tells me that she has had left-sided low back pain for some time.  Unsure of the exact timeline.  Described as sharp and shooting goes down to the foot.  The history is provided by the patient.  Illness Severity:  Moderate Onset quality:  Gradual Duration:  1 day Timing:  Constant Progression:  Unchanged Chronicity:  New Associated symptoms: no chest pain, no congestion, no fever, no headaches, no myalgias, no nausea, no rhinorrhea, no shortness of breath, no vomiting and no wheezing        Past Medical History:  Diagnosis Date  . Actinic keratosis   . Allergic rhinitis   . Aneurysm ([place])   . Arthritis    osteoarthritis, back, hands, wrists  . Basal cell carcinoma   . Bursitis    hips bilat   . CAD (coronary artery disease), autologous vein bypass graft March 2008   Follow-up cath: December 2015 Occluded SVG-D1 and occluded SVG-RI.  [name] CAD in native artery 2005,  03/2014   a.  Severe disease noted in LAD & RI --> referred for CABG x3; b.  Follow-up cath for abnormal Myoview: Occluded SVG-RI and SVG-D1 with patent LIMA-LAD and competitive flow.  60-70% LAD and RI lesions.  Otherwise minimal disease.  EF 70%.  . Cancer ([place])    + basal cell- on leg  . Cataract   . Dyslipidemia    Statin intolerant  . Glaucoma   . History of kidney stones   . Hypertension   . Non-STEMI (non-ST elevated myocardial infarction) [facility]) October 2005   EF by 35-40%, echo 40-50%. Angiography: 99% mid LAD involving D1 followed by 70% mid LAD; 80% RI. --> CABG  . Osteoarthritis   . PONV (postoperative nausea and vomiting)   . S/P CABG x 01 February 2004   LIMA-LAD, SVG-D1, SVG-RI.  [name] Statin intolerance   . Stroke [facility])     Patient Active Problem List   Diagnosis Date Noted  . Memory loss 07/09/2018  . Vitamin B12 deficiency 07/09/2018  . Hypertensive urgency 10/10/2016  . Hypokalemia 10/10/2016  . Migraine equivalent   . Aneurysm, cerebral, nonruptured   . TIA (transient ischemic attack) 04/05/2016  . Cerebellar infarct ([place]) 02/16/2016  . Stroke (cerebrum) ([place]) 01/21/2016  . Neurological deficit present 01/21/2016  .  Greater trochanteric bursitis of right hip 06/15/2015  . Hyperlipidemia LDL goal <70 05/14/2013  . Statin intolerance   . Essential hypertension   . CAD (coronary artery disease), autologous vein bypass graft 06/30/2006  . S/P CABG x 3 01/30/2004  . Non-STEMI (non-ST elevated myocardial infarction) ([place]) 01/30/2004    Past Surgical History:  Procedure Laterality Date  . BUNIONECTOMY     2002  . CARDIOVASCULAR STRESS TEST  05/23/2006   Mild lateral/inferolateral ischemia, likely due to occluded SVGs with exhisting disease.  [name] CAROTID DOPPLER  07/15/2009   Bilat ICAs 0-49% diameter reduction. Normal patency of Bilat subclavian arteries.  . CORONARY ARTERY BYPASS GRAFT  02/22/2004   x3. LIMA to distal LAD, SVG to first diag, SVG to ramus. SVG  harvest from rt thigh.  . EP IMPLANTABLE DEVICE N/A 03/02/2016   Procedure: Loop Recorder Insertion;  Surgeon: Thompson Grayer, MD;  Location: [facility] CV LAB;  Service: Cardiovascular;  Laterality: N/A;  . EXCISION/RELEASE BURSA HIP Right 06/15/2015   Procedure: RIGHT HIP BURSECTOMY WITH GLUTEAL TENDON REPAIR;  Surgeon: Gaynelle Arabian, MD;  Location: [facility] OR[facility];  Service: Orthopedics;  Laterality: Right;  . EYE SURGERY     cataract surgery bilat   . IR ANGIO INTRA EXTRACRAN SEL COM CAROTID INNOMINATE BILAT MOD SED  08/07/2016  . IR ANGIO VERTEBRAL SEL SUBCLAVIAN INNOMINATE BILAT MOD SED  08/07/2016  . IR GENERIC HISTORICAL  06/01/2016   IR RADIOLOGIST EVAL & MGMT 06/01/2016 [facility]-INTERV RAD  . IR RADIOLOGIST EVAL & MGMT  05/07/2017  . LEFT HEART CATHETERIZATION WITH CORONARY ANGIOGRAM  02/19/2004   Significant 2 vessel CAD - LAD, D1 and RI  . LEFT HEART CATHETERIZATION WITH CORONARY/GRAFT ANGIOGRAM N/A 04/28/2014   Procedure: LEFT HEART CATHETERIZATION WITH Beatrix Fetters;  Surgeon: Sinclair Grooms, MD;  Location: [facility] CATH LAB: CTO of SVG-Diag & SVG-RI, patent LIMA-LAD. Patent native circumflex, LAD and RCA. 70% stenosis in a branch of RI. --> Does not explain "high risk perfusion study "  . LEFT HEART CATHETERIZATION WITH CORONARY/GRAFT ANGIOGRAM   07/25/2006   Totally occluded SVG to diag and ramus. Patent LIMA-LAD. Ramus 70-80% proximal stenosis and occluded vein graft.  [name] left total knee replacement      2001  . NM MYOVIEW LTD  January 2008; December 2015   a. Referred for For mild inferolateral and anterolateral/apical lateral defect with mild reversibility.;; b. Large defect in the anterior and inferior wall. Suggestive of potential infarct plus ischemia. HIGH RISK.  [name] RADIOLOGY WITH ANESTHESIA N/A 08/07/2016   Procedure: EMBOLIZATION;  Surgeon: Luanne Bras, MD;  Location: [facility];  Service: Radiology;  Laterality: N/A;  . right total knee replacement      2003  . TRANSTHORACIC  ECHOCARDIOGRAM  02/19/2004; December 2015   a. EF 45-50%, Normal LV function, moderate hypokinesis of anterior wall.;; b. EF 50-55%. No RWMA, GR 1 DD. Normal valves      OB History   No obstetric history on file.     Family History  Problem Relation Age of Onset  . Alzheimer's disease Mother   . Alzheimer's disease Sister   . Hyperlipidemia Sister   . Hypertension Sister   . Diabetes Sister   . Hyperlipidemia Sister   . Hypertension Sister   . Pancreatitis Child     Social History   Tobacco Use  . Smoking status: Never Smoker  . Smokeless tobacco: Never Used  Substance Use Topics  . Alcohol use: Not Currently  Alcohol/week: 1.0 standard drinks    Types: 1 Standard drinks or equivalent per week    Comment: 1 glass of liquor weekly   . Drug use: No    Home Medications Prior to Admission medications   Medication Sig Start Date End Date Taking? Authorizing Provider  acetaminophen (TYLENOL) 500 MG tablet Take 500 mg by mouth every 4 (four) hours as needed (for pain).    Yes [provider]  aspirin EC 81 MG tablet Take 81 mg by mouth at bedtime.    Yes [provider]  atenolol (TENORMIN) 25 MG tablet Take 1 tablet (25 mg total) daily by mouth. May take an additional 25 mg of medication daily, if SBP> 160 or DBP >100 Patient taking differently: Take 25 mg by mouth at bedtime. May take an additional 25 mg of medication daily, if SBP> 160 or DBP >100 03/14/17  Yes Leonie Man, MD  brimonidine (ALPHAGAN P) 0.1 % SOLN Place 1 drop into the left eye in the morning and at bedtime.   Yes [provider]  Cholecalciferol (VITAMIN D3) 50 MCG (2000 UT) capsule Take 1 capsule (2,000 Units total) by mouth at bedtime. 04/09/19  Yes Leonie Man, MD  clopidogrel (PLAVIX) 75 MG tablet Take 1 tablet (75 mg total) by mouth daily. 05/26/19  Yes Leonie Man, MD  donepezil (ARICEPT) 5 MG tablet Take 1 tablet (5 mg total) by mouth at bedtime. 07/10/18  Yes  Lomax, Amy, NP  dorzolamide-timolol (COSOPT) 22.3-6.8 MG/ML ophthalmic solution Place 1 drop into the left eye 2 (two) times daily.  11/14/17  Yes [provider]  latanoprost (XALATAN) 0.005 % ophthalmic solution Place 1 drop into both eyes at bedtime. 06/26/16  Yes [provider]  lisinopril (PRINIVIL,ZESTRIL) 20 MG tablet Take 20 mg by mouth at bedtime.    Yes [provider]    Allergies    Nsaids, Statins, Tricor [fenofibrate], Zetia [ezetimibe], Lescol [fluvastatin], Livalo [pitavastatin], Niaspan [niacin], and Relafen [nabumetone]  Review of Systems   Review of Systems  Constitutional: Negative for chills and fever.  HENT: Negative for congestion and rhinorrhea.   Eyes: Negative for redness and visual disturbance.  Respiratory: Negative for shortness of breath and wheezing.   Cardiovascular: Negative for chest pain and palpitations.  Gastrointestinal: Negative for nausea and vomiting.  Genitourinary: Negative for dysuria and urgency.  Musculoskeletal: Positive for arthralgias, back pain and gait problem. Negative for myalgias.  Skin: Negative for pallor and wound.  Neurological: Negative for dizziness and headaches.    Physical Exam Updated Vital Signs BP (!) 158/77   Pulse 64   Temp 98.2 F (36.8 C) (Oral)   Resp 14   Ht 5\' 5"  (1.651 m)   Wt 55 kg   SpO2 94%   BMI 20.18 kg/m   Physical Exam Vitals and nursing note reviewed.  Constitutional:      General: She is not in acute distress.    Appearance: She is well-developed. She is not diaphoretic.  HENT:     Head: Normocephalic and atraumatic.  Eyes:     Pupils: Pupils are equal, round, and reactive to light.  Cardiovascular:     Rate and Rhythm: Normal rate and regular rhythm.     Heart sounds: No murmur. No friction rub. No gallop.   Pulmonary:     Effort: Pulmonary effort is normal.     Breath sounds: No wheezing or rales.  Abdominal:     General: There is no  distension.      Palpations: Abdomen is soft.     Tenderness: There is no abdominal tenderness.  Musculoskeletal:        General: No tenderness.     Cervical back: Normal range of motion and neck supple.     Comments: Ulnar deformity at the PIP to bilateral hands across all digits.  Palpated from head to toe without any noticed bony tenderness.  Left foot drop.  Skin:    General: Skin is warm and dry.  Neurological:     Mental Status: She is alert and oriented to person, place, and time.     Comments: Left lower extremity weakness compared to right.  Patient states due to pain  Psychiatric:        Behavior: Behavior normal.     ED Results / Procedures / Treatments   Labs (all labs ordered are listed, but only abnormal results are displayed) Labs Reviewed  URINALYSIS, ROUTINE W REFLEX MICROSCOPIC - Abnormal; Notable for the following components:      Result Value   Hgb urine dipstick SMALL (*)    Leukocytes,Ua MODERATE (*)    Bacteria, UA RARE (*)    All other components within normal limits  COMPREHENSIVE METABOLIC PANEL - Abnormal; Notable for the following components:   Glucose, Bld 115 (*)    Total Protein 6.0 (*)    GFR calc non Af Amer 51 (*)    GFR calc Af Amer 59 (*)    All other components within normal limits  CBC WITH DIFFERENTIAL/PLATELET  CK    EKG EKG Interpretation  Date/Time:  Monday July 14 2019 08:10:12 EDT Ventricular Rate:  69 PR Interval:    QRS Duration: 98 QT Interval:  414 QTC Calculation: 444 R Axis:   26 Text Interpretation: Sinus rhythm Ventricular premature complex Borderline T abnormalities, lateral leads t wave flatening Otherwise no significant change Confirmed by Deno Etienne [phone]) on 07/14/2019 8:17:31 AM   Radiology DG Chest 1 View  Result Date: 07/14/2019 CLINICAL DATA:  Pain following fall EXAM: CHEST  1 VIEW COMPARISON:  August 07, 2017 FINDINGS: There is slight atelectasis in the medial left base. The lungs elsewhere are clear. Heart size and  pulmonary vascularity are normal. No adenopathy. Patient is status post coronary artery bypass grafting. There is aortic atherosclerosis. No pneumothorax. There is a suspected nondisplaced fracture of the anterolateral right fifth rib. A loop recorder is present on the left inferiorly. IMPRESSION: Questionable fracture of the anterolateral right fifth rib. No pneumothorax. Mild left base atelectasis. Lungs elsewhere clear. Cardiac silhouette within normal limits and stable. Postoperative changes noted. Aortic Atherosclerosis (ICD10-I70.0). Electronically Signed   By: Lowella Grip III M.D.   On: 07/14/2019 08:49   DG Lumbar Spine Complete  Result Date: 07/14/2019 CLINICAL DATA:  Pain following fall EXAM: LUMBAR SPINE - COMPLETE 4+ VIEW COMPARISON:  None. FINDINGS: Frontal, lateral, spot lumbosacral lateral, and bilateral oblique views were obtained. There are 5 non-rib-bearing lumbar type vertebral bodies. No acute fracture is demonstrable. There is 8 mm of retrolisthesis of L2 on L3. There is 5 mm of anterolisthesis of L4 on L5. There is 4 mm of retrolisthesis of L1 on L2. There is moderately severe disc space narrowing at L4-5 and L5-S1. There is moderate disc space narrowing at L1-2 and L2-3. There are anterior osteophytes at all levels. There is facet osteoarthritic change at all levels bilaterally. There is aortic atherosclerosis. There are calcifications in the right upper quadrant. There is  a fracture of the inferior-most sternal wire. IMPRESSION: No appreciable fracture. Spondylolisthesis at several levels, likely due to underlying spondylosis. Multiple foci disc space narrowing and diffuse bilateral facet osteoarthritic change throughout the lumbar region. Apparent cholelithiasis. Aortic Atherosclerosis (ICD10-I70.0). Electronically Signed   By: Lowella Grip III M.D.   On: 07/14/2019 08:47   CT Head Wo Contrast  Result Date: 07/14/2019 CLINICAL DATA:  Fall. Blunt head trauma. On  anticoagulation. EXAM: CT HEAD WITHOUT CONTRAST TECHNIQUE: Contiguous axial images were obtained from the base of the skull through the vertex without intravenous contrast. COMPARISON:  08/07/2017 FINDINGS: Brain: No evidence of acute infarction, hemorrhage, hydrocephalus, extra-axial collection, or mass lesion/mass effect. Mild diffuse cerebral atrophy and moderate chronic small vessel disease is again seen. Old lacunar infarcts are noted in the right superior cerebellum and left centrum semiovale. Vascular:  No hyperdense vessel or other acute findings. Skull: No evidence of fracture or other significant bone abnormality. Sinuses/Orbits:  No acute findings. Other: None. IMPRESSION: 1. No acute intracranial abnormality. 2. Stable cerebral atrophy, chronic small vessel disease, and old lacunar infarcts. Electronically Signed   By: Marlaine Hind M.D.   On: 07/14/2019 08:21   DG Hip Unilat W or Wo Pelvis 2-3 Views Left  Result Date: 07/14/2019 CLINICAL DATA:  Pain following fall EXAM: DG HIP (WITH OR WITHOUT PELVIS) 2-3V LEFT COMPARISON:  None. FINDINGS: Frontal pelvis as well as frontal and lateral left hip images were obtained. No acute fracture or dislocation. There is moderate symmetric narrowing of each hip joint. No erosive change. There is narrowing of the pubic symphysis. There is apparent postoperative change in the right greater trochanter region. IMPRESSION: Moderate symmetric narrowing of each hip joint and pubic symphysis. No fracture or dislocation. Electronically Signed   By: Lowella Grip III M.D.   On: 07/14/2019 08:45    Procedures Procedures (including critical care time)  Medications Ordered in ED Medications - No data to display  ED Course  I have reviewed the triage vital signs and the nursing notes.  Pertinent labs & imaging results that were available during my care of the patient were reviewed by me and considered in my medical decision making (see chart for details).      MDM Rules/Calculators/A&P                      86 yo F with a chief complaints of weakness.  Patient states for the past week she has been having some worsening difficulty with weakness and today she tripped over something and was unable to get up off the ground.  Unable to walk afterwards with EMS.  Will obtain a laboratory evaluation chest x-ray UA to evaluate for infection.  CT the head.  Social work evaluation.  Social work is arrange for home health.  Lab work is returned and is not significantly change from baseline.  UA is negative for infection.  Chest x-ray viewed by me without focal infiltrate.  Will discharge patient home.  PCP and neurology follow-up.  12:09 PM:  I have discussed the diagnosis/risks/treatment options with the patient and believe the pt to be eligible for discharge home to follow-up with PCP, neuro. We also discussed returning to the ED immediately if new or worsening sx occur. We discussed the sx which are most concerning (e.g., sudden worsening pain, fever, inability to tolerate by mouth) that necessitate immediate return. Medications administered to the patient during their visit and any new prescriptions provided to the patient  are listed below.  Medications given during this visit Medications - No data to display   The patient appears reasonably screen and/or stabilized for discharge and I doubt any other medical condition or other [facility] requiring further screening, evaluation, or treatment in the ED at this time prior to discharge.   Final Clinical Impression(s) / ED Diagnoses Final diagnoses:  Generalized weakness  Foot drop, left    Rx / DC Orders ED Discharge Orders    None       Deno Etienne, DO 07/14/19 1209

## 2019-07-14 NOTE — ED Triage Notes (Signed)
Pt arrives to ED from  home as a level 2 trauma with [facility] with complaints of fall. Patient states she had a mechanical fall this morning over a dog cage and continued to lay on the floor for over an hour trying to get up. Patient stated she fell but did not hit her head or neck. Patient states that her legs feel weak but no other pain noted. Patient is currently on plavix. GCS 15, patient without LOC.

## 2019-07-14 NOTE — Discharge Planning (Signed)
Florie Carico J. Clydene Laming, RN, BSN, [employer] [phone] Spoke with pt at bedside regarding discharge planning for [facility]. Offered pt list of home health agencies to choose from.  Pt chose [facility] to render services. Oswald Hillock, RN of [facility] notified. Patient made aware that [facility] will be in contact in 24-48 hours.  No DME needs identified at this time.

## 2019-07-14 NOTE — Discharge Instructions (Signed)
Follow up with your family doc.  Follow up with a neurologist.  Return for lack of safety at home, one sided weakness or numbness.

## 2019-07-14 NOTE — ED Notes (Signed)
This RN apoke with the family regarding this pts discharge. Pts family stated she believed she could find assistance regarding helping this pt into the house. This RN also educated the family regarding this pts status change and possible need for further assistance such as home health or another support system.

## 2019-07-14 NOTE — ED Notes (Signed)
Patient verbalizes understanding of discharge instructions. Opportunity for questioning and answers were provided. Armband removed by staff, pt discharged from ED in wheelchair to home.   

## 2019-07-15 ENCOUNTER — Encounter (HOSPITAL_COMMUNITY): Payer: Self-pay | Admitting: Radiology

## 2019-07-15 ENCOUNTER — Inpatient Hospital Stay (HOSPITAL_COMMUNITY)
Admission: EM | Admit: 2019-07-15 | Discharge: 2019-07-18 | DRG: 605 | Disposition: A | Discharge status: Skilled Nursing Facility | Payer: Medicare Other | Attending: Internal Medicine | Admitting: Internal Medicine

## 2019-07-15 ENCOUNTER — Emergency Department (HOSPITAL_COMMUNITY): Payer: Medicare Other

## 2019-07-15 ENCOUNTER — Other Ambulatory Visit: Payer: Self-pay

## 2019-07-15 DIAGNOSIS — K8020 Calculus of gallbladder without cholecystitis without obstruction: Secondary | ICD-10-CM | POA: Diagnosis not present

## 2019-07-15 DIAGNOSIS — Z9842 Cataract extraction status, left eye: Secondary | ICD-10-CM

## 2019-07-15 DIAGNOSIS — Z7902 Long term (current) use of antithrombotics/antiplatelets: Secondary | ICD-10-CM

## 2019-07-15 DIAGNOSIS — Z79899 Other long term (current) drug therapy: Secondary | ICD-10-CM

## 2019-07-15 DIAGNOSIS — M545 Low back pain: Secondary | ICD-10-CM | POA: Diagnosis not present

## 2019-07-15 DIAGNOSIS — I252 Old myocardial infarction: Secondary | ICD-10-CM

## 2019-07-15 DIAGNOSIS — T40415A Adverse effect of fentanyl or fentanyl analogs, initial encounter: Secondary | ICD-10-CM | POA: Diagnosis present

## 2019-07-15 DIAGNOSIS — I10 Essential (primary) hypertension: Secondary | ICD-10-CM | POA: Diagnosis present

## 2019-07-15 DIAGNOSIS — S7002XA Contusion of left hip, initial encounter: Secondary | ICD-10-CM

## 2019-07-15 DIAGNOSIS — Z96653 Presence of artificial knee joint, bilateral: Secondary | ICD-10-CM | POA: Diagnosis present

## 2019-07-15 DIAGNOSIS — Z7982 Long term (current) use of aspirin: Secondary | ICD-10-CM

## 2019-07-15 DIAGNOSIS — Z8673 Personal history of transient ischemic attack (TIA), and cerebral infarction without residual deficits: Secondary | ICD-10-CM

## 2019-07-15 DIAGNOSIS — Z8249 Family history of ischemic heart disease and other diseases of the circulatory system: Secondary | ICD-10-CM

## 2019-07-15 DIAGNOSIS — I25810 Atherosclerosis of coronary artery bypass graft(s) without angina pectoris: Secondary | ICD-10-CM | POA: Diagnosis present

## 2019-07-15 DIAGNOSIS — Y92239 Unspecified place in hospital as the place of occurrence of the external cause: Secondary | ICD-10-CM | POA: Diagnosis present

## 2019-07-15 DIAGNOSIS — Z8349 Family history of other endocrine, nutritional and metabolic diseases: Secondary | ICD-10-CM

## 2019-07-15 DIAGNOSIS — M25559 Pain in unspecified hip: Secondary | ICD-10-CM | POA: Diagnosis present

## 2019-07-15 DIAGNOSIS — I493 Ventricular premature depolarization: Secondary | ICD-10-CM | POA: Diagnosis present

## 2019-07-15 DIAGNOSIS — Z7409 Other reduced mobility: Secondary | ICD-10-CM | POA: Diagnosis not present

## 2019-07-15 DIAGNOSIS — Z833 Family history of diabetes mellitus: Secondary | ICD-10-CM

## 2019-07-15 DIAGNOSIS — Z20822 Contact with and (suspected) exposure to covid-19: Secondary | ICD-10-CM | POA: Diagnosis present

## 2019-07-15 DIAGNOSIS — Z951 Presence of aortocoronary bypass graft: Secondary | ICD-10-CM

## 2019-07-15 DIAGNOSIS — H409 Unspecified glaucoma: Secondary | ICD-10-CM | POA: Diagnosis present

## 2019-07-15 DIAGNOSIS — Z820 Family history of epilepsy and other diseases of the nervous system: Secondary | ICD-10-CM

## 2019-07-15 DIAGNOSIS — I2510 Atherosclerotic heart disease of native coronary artery without angina pectoris: Secondary | ICD-10-CM | POA: Diagnosis present

## 2019-07-15 DIAGNOSIS — R52 Pain, unspecified: Secondary | ICD-10-CM | POA: Diagnosis not present

## 2019-07-15 DIAGNOSIS — M25571 Pain in right ankle and joints of right foot: Secondary | ICD-10-CM | POA: Diagnosis not present

## 2019-07-15 DIAGNOSIS — R1084 Generalized abdominal pain: Secondary | ICD-10-CM | POA: Diagnosis not present

## 2019-07-15 DIAGNOSIS — Z9841 Cataract extraction status, right eye: Secondary | ICD-10-CM

## 2019-07-15 DIAGNOSIS — W010XXA Fall on same level from slipping, tripping and stumbling without subsequent striking against object, initial encounter: Secondary | ICD-10-CM | POA: Diagnosis present

## 2019-07-15 DIAGNOSIS — Y9301 Activity, walking, marching and hiking: Secondary | ICD-10-CM | POA: Diagnosis present

## 2019-07-15 DIAGNOSIS — E876 Hypokalemia: Secondary | ICD-10-CM | POA: Diagnosis present

## 2019-07-15 DIAGNOSIS — Z03818 Encounter for observation for suspected exposure to other biological agents ruled out: Secondary | ICD-10-CM | POA: Diagnosis not present

## 2019-07-15 DIAGNOSIS — F0390 Unspecified dementia without behavioral disturbance: Secondary | ICD-10-CM | POA: Diagnosis present

## 2019-07-15 DIAGNOSIS — Z85828 Personal history of other malignant neoplasm of skin: Secondary | ICD-10-CM

## 2019-07-15 DIAGNOSIS — M21372 Foot drop, left foot: Secondary | ICD-10-CM | POA: Diagnosis present

## 2019-07-15 DIAGNOSIS — R531 Weakness: Secondary | ICD-10-CM | POA: Diagnosis not present

## 2019-07-15 DIAGNOSIS — E785 Hyperlipidemia, unspecified: Secondary | ICD-10-CM | POA: Diagnosis present

## 2019-07-15 DIAGNOSIS — Z87442 Personal history of urinary calculi: Secondary | ICD-10-CM

## 2019-07-15 DIAGNOSIS — W19XXXA Unspecified fall, initial encounter: Secondary | ICD-10-CM | POA: Diagnosis not present

## 2019-07-15 LAB — CBC WITH DIFFERENTIAL/PLATELET
Abs Immature Granulocytes: 0.01 10*3/uL (ref 0.00–0.07)
Basophils Absolute: 0 10*3/uL (ref 0.0–0.1)
Basophils Relative: 1 %
Eosinophils Absolute: 0 10*3/uL (ref 0.0–0.5)
Eosinophils Relative: 1 %
HCT: 39.1 % (ref 36.0–46.0)
Hemoglobin: 12.7 g/dL (ref 12.0–15.0)
Immature Granulocytes: 0 %
Lymphocytes Relative: 28 %
Lymphs Abs: 1.8 10*3/uL (ref 0.7–4.0)
MCH: 31.4 pg (ref 26.0–34.0)
MCHC: 32.5 g/dL (ref 30.0–36.0)
MCV: 96.8 fL (ref 80.0–100.0)
Monocytes Absolute: 0.6 10*3/uL (ref 0.1–1.0)
Monocytes Relative: 10 %
Neutro Abs: 3.7 10*3/uL (ref 1.7–7.7)
Neutrophils Relative %: 60 %
Platelets: 209 10*3/uL (ref 150–400)
RBC: 4.04 MIL/uL (ref 3.87–5.11)
RDW: 13 % (ref 11.5–15.5)
WBC: 6.2 10*3/uL (ref 4.0–10.5)
nRBC: 0 % (ref 0.0–0.2)

## 2019-07-15 LAB — COMPREHENSIVE METABOLIC PANEL
ALT: 25 U/L (ref 0–44)
AST: 64 U/L — ABNORMAL HIGH (ref 15–41)
Albumin: 3.9 g/dL (ref 3.5–5.0)
Alkaline Phosphatase: 76 U/L (ref 38–126)
Anion gap: 11 (ref 5–15)
BUN: 18 mg/dL (ref 8–23)
CO2: 26 mmol/L (ref 22–32)
Calcium: 9.2 mg/dL (ref 8.9–10.3)
Chloride: 108 mmol/L (ref 98–111)
Creatinine, Ser: 0.91 mg/dL (ref 0.44–1.00)
GFR calc Af Amer: >60 mL/min (ref >60)
GFR calc non Af Amer: 57 mL/min — ABNORMAL LOW (ref >60)
Glucose, Bld: 110 mg/dL — ABNORMAL HIGH (ref 70–99)
Potassium: 3.3 mmol/L — ABNORMAL LOW (ref 3.5–5.1)
Sodium: 145 mmol/L (ref 135–145)
Total Bilirubin: 1 mg/dL (ref 0.3–1.2)
Total Protein: 6.3 g/dL — ABNORMAL LOW (ref 6.5–8.1)

## 2019-07-15 LAB — URINALYSIS, ROUTINE W REFLEX MICROSCOPIC
Bilirubin Urine: NEGATIVE
Glucose, UA: NEGATIVE mg/dL
Hgb urine dipstick: NEGATIVE
Ketones, ur: NEGATIVE mg/dL
Leukocytes,Ua: NEGATIVE
Nitrite: NEGATIVE
Protein, ur: NEGATIVE mg/dL
Specific Gravity, Urine: 1.033 — ABNORMAL HIGH (ref 1.005–1.030)
pH: 5 (ref 5.0–8.0)

## 2019-07-15 LAB — LIPASE, BLOOD: Lipase: 15 U/L (ref 11–51)

## 2019-07-15 LAB — TROPONIN I (HIGH SENSITIVITY)
Troponin I (High Sensitivity): 25 ng/L — ABNORMAL HIGH (ref <18)
Troponin I (High Sensitivity): 26 ng/L — ABNORMAL HIGH (ref <18)

## 2019-07-15 LAB — SARS CORONAVIRUS 2 (TAT 6-24 HRS): SARS Coronavirus 2: NEGATIVE

## 2019-07-15 MED ORDER — SENNA 8.6 MG PO TABS
1.0000 | ORAL_TABLET | Freq: Two times a day (BID) | ORAL | Status: DC
  Administered 2019-07-15 – 2019-07-18 (×6): 8.6 mg via ORAL
  Filled 2019-07-15 (×6): qty 1

## 2019-07-15 MED ORDER — DORZOLAMIDE HCL-TIMOLOL MAL 2-0.5 % OP SOLN
1.0000 [drp] | Freq: Two times a day (BID) | OPHTHALMIC | Status: DC
  Administered 2019-07-15 – 2019-07-18 (×6): 1 [drp] via OPHTHALMIC
  Filled 2019-07-15: qty 10

## 2019-07-15 MED ORDER — HYDRALAZINE HCL 10 MG PO TABS
10.0000 mg | ORAL_TABLET | Freq: Three times a day (TID) | ORAL | Status: DC | PRN
  Administered 2019-07-17: 10 mg via ORAL
  Filled 2019-07-15 (×2): qty 1

## 2019-07-15 MED ORDER — FENTANYL CITRATE (PF) 100 MCG/2ML IJ SOLN
50.0000 ug | Freq: Once | INTRAMUSCULAR | Status: AC
  Administered 2019-07-15: 50 ug via INTRAVENOUS
  Filled 2019-07-15: qty 2

## 2019-07-15 MED ORDER — LATANOPROST 0.005 % OP SOLN
1.0000 [drp] | Freq: Every day | OPHTHALMIC | Status: DC
  Administered 2019-07-15 – 2019-07-17 (×3): 1 [drp] via OPHTHALMIC
  Filled 2019-07-15: qty 2.5

## 2019-07-15 MED ORDER — SODIUM CHLORIDE 0.9 % IV BOLUS
500.0000 mL | Freq: Once | INTRAVENOUS | Status: AC
  Administered 2019-07-15: 500 mL via INTRAVENOUS

## 2019-07-15 MED ORDER — IOHEXOL 300 MG/ML  SOLN
100.0000 mL | Freq: Once | INTRAMUSCULAR | Status: AC | PRN
  Administered 2019-07-15: 100 mL via INTRAVENOUS

## 2019-07-15 MED ORDER — ATENOLOL 25 MG PO TABS
25.0000 mg | ORAL_TABLET | Freq: Every day | ORAL | Status: DC
  Administered 2019-07-15 – 2019-07-17 (×3): 25 mg via ORAL
  Filled 2019-07-15 (×3): qty 1

## 2019-07-15 MED ORDER — POTASSIUM CHLORIDE CRYS ER 20 MEQ PO TBCR
40.0000 meq | EXTENDED_RELEASE_TABLET | Freq: Once | ORAL | Status: AC
  Administered 2019-07-15: 40 meq via ORAL
  Filled 2019-07-15: qty 2

## 2019-07-15 MED ORDER — VITAMIN D3 25 MCG (1000 UNIT) PO TABS
2000.0000 [IU] | ORAL_TABLET | Freq: Every day | ORAL | Status: DC
  Administered 2019-07-15 – 2019-07-17 (×3): 2000 [IU] via ORAL
  Filled 2019-07-15 (×3): qty 2

## 2019-07-15 MED ORDER — ONDANSETRON HCL 4 MG/2ML IJ SOLN: 4.0000 mg | Freq: Four times a day (QID) | INTRAMUSCULAR | Status: DC | PRN

## 2019-07-15 MED ORDER — DONEPEZIL HCL 5 MG PO TABS
5.0000 mg | ORAL_TABLET | Freq: Every day | ORAL | Status: DC
  Administered 2019-07-15 – 2019-07-17 (×3): 5 mg via ORAL
  Filled 2019-07-15 (×3): qty 1

## 2019-07-15 MED ORDER — SODIUM CHLORIDE 0.9 % IV SOLN: INTRAVENOUS | Status: DC

## 2019-07-15 MED ORDER — BRIMONIDINE TARTRATE 0.15 % OP SOLN
1.0000 [drp] | Freq: Two times a day (BID) | OPHTHALMIC | Status: DC
  Administered 2019-07-15 – 2019-07-18 (×6): 1 [drp] via OPHTHALMIC
  Filled 2019-07-15: qty 5

## 2019-07-15 MED ORDER — ACETAMINOPHEN 500 MG PO TABS
500.0000 mg | ORAL_TABLET | Freq: Three times a day (TID) | ORAL | Status: DC
  Administered 2019-07-15 – 2019-07-18 (×8): 500 mg via ORAL
  Filled 2019-07-15 (×8): qty 1

## 2019-07-15 MED ORDER — SODIUM CHLORIDE (PF) 0.9 % IJ SOLN
INTRAMUSCULAR | Status: AC
  Filled 2019-07-15: qty 50

## 2019-07-15 MED ORDER — LISINOPRIL 20 MG PO TABS
20.0000 mg | ORAL_TABLET | Freq: Every day | ORAL | Status: DC
  Administered 2019-07-15 – 2019-07-17 (×3): 20 mg via ORAL
  Filled 2019-07-15 (×3): qty 1

## 2019-07-15 MED ORDER — TRAMADOL HCL 50 MG PO TABS: 25.0000 mg | ORAL_TABLET | Freq: Four times a day (QID) | ORAL | Status: DC | PRN

## 2019-07-15 MED ORDER — ONDANSETRON HCL 4 MG PO TABS: 4.0000 mg | ORAL_TABLET | Freq: Four times a day (QID) | ORAL | Status: DC | PRN

## 2019-07-15 NOTE — ED Triage Notes (Signed)
Per [facility], patient fell at home yesterday, ground level. Patient c/o right foot and LUQ pain 7/10. Patient went to [place] yesterday and was d/c. Daughter stated to EMS that patient was unable to get out of car and walk yesterday upon d/c and this is not baseline for patient.

## 2019-07-15 NOTE — ED Provider Notes (Signed)
[facility] DEPT Provider Note   CSN: [id number] Arrival date & time: 07/15/19  1134     History Chief Complaint  Patient presents with   Fall   Foot Pain    [name] is a 86 y.o. female.  HPI 86 year old female presents with left leg weakness.  Seen at [facility] yesterday after a trip and fall.  She was walking and did not see that she backed into a dog crate and fell backwards in the garage.  Denies any current back pain.  Left leg has been painful and heavy.  After discharge yesterday she was unable to get out of the car according to the daughter via EMS.  She states her leg has been heavy since then but denies any numbness.  Some pain in her left hip.  Also states some intermittent left upper abdominal pain since last night.  Currently she tells me she has a little bit of chest pain over her sternum.  No headache.  No extremity weakness in the arms or right leg.   Past Medical History:  Diagnosis Date   Actinic keratosis    Allergic rhinitis    Aneurysm ([name])    Arthritis    osteoarthritis, back, hands, wrists   Basal cell carcinoma    Bursitis    hips bilat    CAD (coronary artery disease), autologous vein bypass graft March 2008   Follow-up cath: December 2015 Occluded SVG-D1 and occluded SVG-RI.   CAD in native artery 2005, 03/2014   a.  Severe disease noted in LAD & RI --> referred for CABG x3; b.  Follow-up cath for abnormal Myoview: Occluded SVG-RI and SVG-D1 with patent LIMA-LAD and competitive flow.  60-70% LAD and RI lesions.  Otherwise minimal disease.  EF 70%.   Cancer ([place])    + basal cell- on leg   Cataract    Dyslipidemia    Statin intolerant   Glaucoma    History of kidney stones    Hypertension    Non-STEMI (non-ST elevated myocardial infarction) [facility]) October 2005   EF by 35-40%, echo 40-50%. Angiography: 99% mid LAD involving D1 followed by 70% mid LAD; 80% RI. --> CABG   Osteoarthritis     PONV (postoperative nausea and vomiting)    S/P CABG x 01 February 2004   LIMA-LAD, SVG-D1, SVG-RI.   Statin intolerance    Stroke [facility])     Patient Active Problem List   Diagnosis Date Noted   Hip pain 07/15/2019   Memory loss 07/09/2018   Vitamin B12 deficiency 07/09/2018   Hypertensive urgency 10/10/2016   Hypokalemia 10/10/2016   Migraine equivalent    Aneurysm, cerebral, nonruptured    TIA (transient ischemic attack) 04/05/2016   Cerebellar infarct ([place]) 02/16/2016   Stroke (cerebrum) ([place]) 01/21/2016   Neurological deficit present 01/21/2016   Greater trochanteric bursitis of right hip 06/15/2015   Hyperlipidemia LDL goal <70 05/14/2013   Statin intolerance    Essential hypertension    CAD (coronary artery disease), autologous vein bypass graft 06/30/2006   S/P CABG x 3 01/30/2004   Non-STEMI (non-ST elevated myocardial infarction) ([place]) 01/30/2004    Past Surgical History:  Procedure Laterality Date   BUNIONECTOMY     2002   CARDIOVASCULAR STRESS TEST  05/23/2006   Mild lateral/inferolateral ischemia, likely due to occluded SVGs with exhisting disease.   CAROTID DOPPLER  07/15/2009   Bilat ICAs 0-49% diameter reduction. Normal patency of Bilat subclavian arteries.  CORONARY ARTERY BYPASS GRAFT  02/22/2004   x3. LIMA to distal LAD, SVG to first diag, SVG to ramus. SVG harvest from rt thigh.   EP IMPLANTABLE DEVICE N/A 03/02/2016   Procedure: Loop Recorder Insertion;  Surgeon: Thompson Grayer, MD;  Location: [facility] CV LAB;  Service: Cardiovascular;  Laterality: N/A;   EXCISION/RELEASE BURSA HIP Right 06/15/2015   Procedure: RIGHT HIP BURSECTOMY WITH GLUTEAL TENDON REPAIR;  Surgeon: Gaynelle Arabian, MD;  Location: [facility];  Service: Orthopedics;  Laterality: Right;   EYE SURGERY     cataract surgery bilat    IR ANGIO INTRA EXTRACRAN SEL COM CAROTID INNOMINATE BILAT MOD SED  08/07/2016   IR ANGIO VERTEBRAL SEL SUBCLAVIAN INNOMINATE BILAT MOD SED   08/07/2016   IR GENERIC HISTORICAL  06/01/2016   IR RADIOLOGIST EVAL & MGMT 06/01/2016 [facility]-INTERV RAD   IR RADIOLOGIST EVAL & MGMT  05/07/2017   LEFT HEART CATHETERIZATION WITH CORONARY ANGIOGRAM  02/19/2004   Significant 2 vessel CAD - LAD, D1 and RI   LEFT HEART CATHETERIZATION WITH CORONARY/GRAFT ANGIOGRAM N/A 04/28/2014   Procedure: LEFT HEART CATHETERIZATION WITH Beatrix Fetters;  Surgeon: Sinclair Grooms, MD;  Location: [facility] CATH LAB: CTO of SVG-Diag & SVG-RI, patent LIMA-LAD. Patent native circumflex, LAD and RCA. 70% stenosis in a branch of RI. --> Does not explain "high risk perfusion study "   LEFT HEART CATHETERIZATION WITH CORONARY/GRAFT ANGIOGRAM   07/25/2006   Totally occluded SVG to diag and ramus. Patent LIMA-LAD. Ramus 70-80% proximal stenosis and occluded vein graft.   left total knee replacement      2001   NM MYOVIEW LTD  January 2008; December 2015   a. Referred for For mild inferolateral and anterolateral/apical lateral defect with mild reversibility.;; b. Large defect in the anterior and inferior wall. Suggestive of potential infarct plus ischemia. HIGH RISK.   RADIOLOGY WITH ANESTHESIA N/A 08/07/2016   Procedure: EMBOLIZATION;  Surgeon: Luanne Bras, MD;  Location: [facility];  Service: Radiology;  Laterality: N/A;   right total knee replacement      2003   TRANSTHORACIC ECHOCARDIOGRAM  02/19/2004; December 2015   a. EF 45-50%, Normal LV function, moderate hypokinesis of anterior wall.;; b. EF 50-55%. No RWMA, GR 1 DD. Normal valves      OB History   No obstetric history on file.     Family History  Problem Relation Age of Onset   Alzheimer's disease Mother    Alzheimer's disease Sister    Hyperlipidemia Sister    Hypertension Sister    Diabetes Sister    Hyperlipidemia Sister    Hypertension Sister    Pancreatitis Child     Social History   Tobacco Use   Smoking status: Never Smoker   Smokeless tobacco: Never Used  Substance Use  Topics   Alcohol use: Not Currently    Alcohol/week: 1.0 standard drinks    Types: 1 Standard drinks or equivalent per week    Comment: 1 glass of liquor weekly    Drug use: No    Home Medications Prior to Admission medications   Medication Sig Start Date End Date Taking? Authorizing Provider  acetaminophen (TYLENOL) 500 MG tablet Take 500 mg by mouth every 4 (four) hours as needed (for pain).     [provider]  aspirin EC 81 MG tablet Take 81 mg by mouth at bedtime.     [provider]  atenolol (TENORMIN) 25 MG tablet Take 1 tablet (25 mg total) daily  by mouth. May take an additional 25 mg of medication daily, if SBP> 160 or DBP >100 Patient taking differently: Take 25 mg by mouth at bedtime. May take an additional 25 mg of medication daily, if SBP> 160 or DBP >100 03/14/17   Leonie Man, MD  brimonidine (ALPHAGAN P) 0.1 % SOLN Place 1 drop into the left eye in the morning and at bedtime.    [provider]  Cholecalciferol (VITAMIN D3) 50 MCG (2000 UT) capsule Take 1 capsule (2,000 Units total) by mouth at bedtime. 04/09/19   Leonie Man, MD  clopidogrel (PLAVIX) 75 MG tablet Take 1 tablet (75 mg total) by mouth daily. 05/26/19   Leonie Man, MD  donepezil (ARICEPT) 5 MG tablet Take 1 tablet (5 mg total) by mouth at bedtime. 07/10/18   Lomax, Amy, NP  dorzolamide-timolol (COSOPT) 22.3-6.8 MG/ML ophthalmic solution Place 1 drop into the left eye 2 (two) times daily.  11/14/17   [provider]  latanoprost (XALATAN) 0.005 % ophthalmic solution Place 1 drop into both eyes at bedtime. 06/26/16   [provider]  lisinopril (PRINIVIL,ZESTRIL) 20 MG tablet Take 20 mg by mouth at bedtime.     [provider]    Allergies    Nsaids, Statins, Tricor [fenofibrate], Zetia [ezetimibe], Lescol [fluvastatin], Livalo [pitavastatin], Niaspan [niacin], and Relafen [nabumetone]  Review of Systems   Review of Systems  Constitutional:  Negative for fever.  Cardiovascular: Positive for chest pain.  Gastrointestinal: Positive for abdominal pain. Negative for vomiting.  Musculoskeletal: Positive for arthralgias. Negative for back pain.  Neurological: Positive for weakness. Negative for numbness and headaches.  All other systems reviewed and are negative.   Physical Exam Updated Vital Signs BP (!) 152/115    Pulse 74    Temp 98.7 F (37.1 C) (Oral)    Resp 16    Ht 5\' 5"  (1.651 m)    Wt 55 kg    SpO2 97%    BMI 20.18 kg/m   Physical Exam Vitals and nursing note reviewed.  Constitutional:      Appearance: She is well-developed.  HENT:     Head: Normocephalic and atraumatic.     Right Ear: External ear normal.     Left Ear: External ear normal.     Nose: Nose normal.  Eyes:     General:        Right eye: No discharge.        Left eye: No discharge.  Cardiovascular:     Rate and Rhythm: Normal rate and regular rhythm.     Pulses:          Dorsalis pedis pulses are 2+ on the right side and 2+ on the left side.     Heart sounds: Normal heart sounds.  Pulmonary:     Effort: Pulmonary effort is normal.     Breath sounds: Normal breath sounds.  Chest:     Chest wall: No tenderness.  Abdominal:     General: There is no distension.     Palpations: Abdomen is soft.     Tenderness: There is no abdominal tenderness.  Musculoskeletal:     Left hip: Tenderness present. No deformity.     Left upper leg: No tenderness.       Legs:  Skin:    General: Skin is warm and dry.  Neurological:     Mental Status: She is alert and oriented to person, place, and time.  Comments: CN 3-12 grossly intact. 5/5 strength in right upper/lower and left upper extremities. 4/5 strength in left lower extremity Grossly normal sensation.  Psychiatric:        Mood and Affect: Mood is not anxious.     ED Results / Procedures / Treatments   Labs (all labs ordered are listed, but only abnormal results are displayed) Labs Reviewed    COMPREHENSIVE METABOLIC PANEL - Abnormal; Notable for the following components:      Result Value   Potassium 3.3 (*)    Glucose, Bld 110 (*)    Total Protein 6.3 (*)    AST 64 (*)    GFR calc non Af Amer 57 (*)    All other components within normal limits  URINALYSIS, ROUTINE W REFLEX MICROSCOPIC - Abnormal; Notable for the following components:   Specific Gravity, Urine 1.033 (*)    All other components within normal limits  TROPONIN I (HIGH SENSITIVITY) - Abnormal; Notable for the following components:   Troponin I (High Sensitivity) 25 (*)    All other components within normal limits  TROPONIN I (HIGH SENSITIVITY) - Abnormal; Notable for the following components:   Troponin I (High Sensitivity) 26 (*)    All other components within normal limits  SARS CORONAVIRUS 2 (TAT 6-24 HRS)  LIPASE, BLOOD  CBC WITH DIFFERENTIAL/PLATELET    EKG EKG Interpretation  Date/Time:  Tuesday July 15 2019 12:32:59 EDT Ventricular Rate:  66 PR Interval:    QRS Duration: 95 QT Interval:  419 QTC Calculation: 439 R Axis:   22 Text Interpretation: Sinus arrhythmia Ventricular premature complex Borderline T wave abnormalities similar to yesterday Confirmed by Sherwood Gambler [phone]) on 07/15/2019 1:56:49 PM   Radiology DG Chest 1 View  Result Date: 07/14/2019 CLINICAL DATA:  Pain following fall EXAM: CHEST  1 VIEW COMPARISON:  August 07, 2017 FINDINGS: There is slight atelectasis in the medial left base. The lungs elsewhere are clear. Heart size and pulmonary vascularity are normal. No adenopathy. Patient is status post coronary artery bypass grafting. There is aortic atherosclerosis. No pneumothorax. There is a suspected nondisplaced fracture of the anterolateral right fifth rib. A loop recorder is present on the left inferiorly. IMPRESSION: Questionable fracture of the anterolateral right fifth rib. No pneumothorax. Mild left base atelectasis. Lungs elsewhere clear. Cardiac silhouette within normal  limits and stable. Postoperative changes noted. Aortic Atherosclerosis (ICD10-I70.0). Electronically Signed   By: Lowella Grip III M.D.   On: 07/14/2019 08:49   DG Lumbar Spine Complete  Result Date: 07/14/2019 CLINICAL DATA:  Pain following fall EXAM: LUMBAR SPINE - COMPLETE 4+ VIEW COMPARISON:  None. FINDINGS: Frontal, lateral, spot lumbosacral lateral, and bilateral oblique views were obtained. There are 5 non-rib-bearing lumbar type vertebral bodies. No acute fracture is demonstrable. There is 8 mm of retrolisthesis of L2 on L3. There is 5 mm of anterolisthesis of L4 on L5. There is 4 mm of retrolisthesis of L1 on L2. There is moderately severe disc space narrowing at L4-5 and L5-S1. There is moderate disc space narrowing at L1-2 and L2-3. There are anterior osteophytes at all levels. There is facet osteoarthritic change at all levels bilaterally. There is aortic atherosclerosis. There are calcifications in the right upper quadrant. There is a fracture of the inferior-most sternal wire. IMPRESSION: No appreciable fracture. Spondylolisthesis at several levels, likely due to underlying spondylosis. Multiple foci disc space narrowing and diffuse bilateral facet osteoarthritic change throughout the lumbar region. Apparent cholelithiasis. Aortic Atherosclerosis (ICD10-I70.0). Electronically Signed  By: Lowella Grip III M.D.   On: 07/14/2019 08:47   CT Head Wo Contrast  Result Date: 07/14/2019 CLINICAL DATA:  Fall. Blunt head trauma. On anticoagulation. EXAM: CT HEAD WITHOUT CONTRAST TECHNIQUE: Contiguous axial images were obtained from the base of the skull through the vertex without intravenous contrast. COMPARISON:  08/07/2017 FINDINGS: Brain: No evidence of acute infarction, hemorrhage, hydrocephalus, extra-axial collection, or mass lesion/mass effect. Mild diffuse cerebral atrophy and moderate chronic small vessel disease is again seen. Old lacunar infarcts are noted in the right superior  cerebellum and left centrum semiovale. Vascular:  No hyperdense vessel or other acute findings. Skull: No evidence of fracture or other significant bone abnormality. Sinuses/Orbits:  No acute findings. Other: None. IMPRESSION: 1. No acute intracranial abnormality. 2. Stable cerebral atrophy, chronic small vessel disease, and old lacunar infarcts. Electronically Signed   By: Marlaine Hind M.D.   On: 07/14/2019 08:21   CT ABDOMEN PELVIS W CONTRAST  Result Date: 07/15/2019 CLINICAL DATA:  Abdominal pain EXAM: CT ABDOMEN AND PELVIS WITH CONTRAST TECHNIQUE: Multidetector CT imaging of the abdomen and pelvis was performed using the standard protocol following bolus administration of intravenous contrast. CONTRAST:  110mL OMNIPAQUE IOHEXOL 300 MG/ML  SOLN COMPARISON:  None. FINDINGS: Lower chest: Bibasilar atelectasis/scarring. Hepatobiliary: No focal liver lesion. Cholelithiasis. No ductal dilatation. Pancreas: Atrophic.  Otherwise unremarkable. Spleen: Unremarkable. Adrenals/Urinary Tract: Upper pole right renal cyst. No hydronephrosis. Bladder is unremarkable. Stomach/Bowel: Stomach is within normal limits. Bowel is normal in caliber. Appendix is not visualized. Vascular/Lymphatic: Aortic atherosclerosis. No enlarged abdominal or pelvic lymph nodes. Reproductive: Uterus and bilateral adnexa are unremarkable. Other: No abdominal wall hernia or abnormality. No abdominopelvic ascites. Musculoskeletal: Soft tissue swelling/hematoma adjacent to the left greater trochanter and lateral thigh. Advanced degenerative changes of the lumbar spine. IMPRESSION: Cholelithiasis. Soft tissue swelling/hematoma adjacent to left hip and lateral thigh. Electronically Signed   By: Macy Mis M.D.   On: 07/15/2019 14:27   DG Hip Unilat W or Wo Pelvis 2-3 Views Left  Result Date: 07/14/2019 CLINICAL DATA:  Pain following fall EXAM: DG HIP (WITH OR WITHOUT PELVIS) 2-3V LEFT COMPARISON:  None. FINDINGS: Frontal pelvis as well as  frontal and lateral left hip images were obtained. No acute fracture or dislocation. There is moderate symmetric narrowing of each hip joint. No erosive change. There is narrowing of the pubic symphysis. There is apparent postoperative change in the right greater trochanter region. IMPRESSION: Moderate symmetric narrowing of each hip joint and pubic symphysis. No fracture or dislocation. Electronically Signed   By: Lowella Grip III M.D.   On: 07/14/2019 08:45    Procedures Procedures (including critical care time)  Medications Ordered in ED Medications  sodium chloride (PF) 0.9 % injection (has no administration in time range)  sodium chloride 0.9 % bolus 500 mL (0 mLs Intravenous Stopped 07/15/19 1500)  iohexol (OMNIPAQUE) 300 MG/ML solution 100 mL (100 mLs Intravenous Contrast Given 07/15/19 1355)  fentaNYL (SUBLIMAZE) injection 50 mcg (50 mcg Intravenous Given 07/15/19 1502)  potassium chloride SA (KLOR-CON) CR tablet 40 mEq (40 mEq Oral Given 07/15/19 1526)    ED Course  I have reviewed the triage vital signs and the nursing notes.  Pertinent labs & imaging results that were available during my care of the patient were reviewed by me and considered in my medical decision making (see chart for details).    MDM Rules/Calculators/A&P  Patient is unable to ambulate without pain control.  She was given IV fentanyl which unfortunately has made her a little delirious and while she can walk with this, she is very unsteady and needs significant help which her granddaughter states she cannot provide alone at home.  Thus I think she is a high fall risk.  Given the need for pain control and probably physical therapy and plus minus rehab I think she should come into the hospital for observation.  Discussed with Dr. Tyrell Antonio.  Updated the granddaughter.  No occult hip fracture on CT.  She was having some generalized weakness so troponins were sent for screening but these are flat  and minimally elevated but probably not consistent with ACS. Final Clinical Impression(s) / ED Diagnoses Final diagnoses:  Contusion of left hip, initial encounter    Rx / DC Orders ED Discharge Orders    None       Sherwood Gambler, MD 07/15/19 1712

## 2019-07-15 NOTE — Progress Notes (Signed)
Patient arrived to unit from ED. Patient is alert/confused. Bruise noted on left hip.  MD notified.

## 2019-07-15 NOTE — ED Notes (Signed)
Pt was able to bear weight while standing but was not mobile without command and  2 staff assist.  MD and RN are aware.

## 2019-07-15 NOTE — H&P (Signed)
.    History and Physical  [name] [id number] DOB: 24-Oct-1931 DOA: 07/15/2019  PCP: Carol Ada, MD Patient coming from: Home with [id number]   I have personally briefly reviewed patient's old medical records in [place]   Chief Complaint: pain, inability to walk.   HPI: [name] is a 86 y.o. female  past medical history significant for CAD status post bypass, glaucoma, hypertension who presents with persistent left hip pain and inability to walk.  Of note patient was evaluated at [facility] day  prior to this admission after she tripped and fell.  She was walking and tripped into a dog crate. She  fell backward in the garage.  She reports pain in her left leg, described as painful and heavy.  After she was discharged yesterday she was not able to get out of the car according to the daughter.  Per report also patient was having some intermittent left upper abdominal pain.  She reports some mild chest pain to the ED physician.  Currently she is chest pain-free. After patient received IV fentanyl, she had become confused and unable to walk.  She is unsteady.  Patient is confused, she is only oriented to person.  Denies pain currently..  Evaluation in the ED; electrolytes normal other than low potassium at 3.3, AST 64, troponin 25, UA negative.  CT abdomen and pelvis: Cholelithiasis. Soft tissue swelling/hematoma adjacent to left hip and lateral thigh.   Review of Systems: All systems reviewed and apart from history of presenting illness, are negative.  Past Medical History:  Diagnosis Date  . Actinic keratosis   . Allergic rhinitis   . Aneurysm ([place])   . Arthritis    osteoarthritis, back, hands, wrists  . Basal cell carcinoma   . Bursitis    hips bilat   . CAD (coronary artery disease), autologous vein bypass graft March 2008   Follow-up cath: December 2015 Occluded SVG-D1 and occluded SVG-RI.  [name] CAD in native artery 2005, 03/2014   a.   Severe disease noted in LAD & RI --> referred for CABG x3; b.  Follow-up cath for abnormal Myoview: Occluded SVG-RI and SVG-D1 with patent LIMA-LAD and competitive flow.  60-70% LAD and RI lesions.  Otherwise minimal disease.  EF 70%.  . Cancer ([place])    + basal cell- on leg  . Cataract   . Dyslipidemia    Statin intolerant  . Glaucoma   . History of kidney stones   . Hypertension   . Non-STEMI (non-ST elevated myocardial infarction) [facility]) October 2005   EF by 35-40%, echo 40-50%. Angiography: 99% mid LAD involving D1 followed by 70% mid LAD; 80% RI. --> CABG  . Osteoarthritis   . PONV (postoperative nausea and vomiting)   . S/P CABG x 01 February 2004   LIMA-LAD, SVG-D1, SVG-RI.  [name] Statin intolerance   . Stroke [facility])    Past Surgical History:  Procedure Laterality Date  . BUNIONECTOMY     2002  . CARDIOVASCULAR STRESS TEST  05/23/2006   Mild lateral/inferolateral ischemia, likely due to occluded SVGs with exhisting disease.  [name] CAROTID DOPPLER  07/15/2009   Bilat ICAs 0-49% diameter reduction. Normal patency of Bilat subclavian arteries.  . CORONARY ARTERY BYPASS GRAFT  02/22/2004   x3. LIMA to distal LAD, SVG to first diag, SVG to ramus. SVG harvest from rt thigh.  . EP IMPLANTABLE DEVICE N/A 03/02/2016   Procedure: Loop Recorder Insertion;  Surgeon: Thompson Grayer, MD;  Location: [facility] CV LAB;  Service: Cardiovascular;  Laterality: N/A;  . EXCISION/RELEASE BURSA HIP Right 06/15/2015   Procedure: RIGHT HIP BURSECTOMY WITH GLUTEAL TENDON REPAIR;  Surgeon: Gaynelle Arabian, MD;  Location: [facility];  Service: Orthopedics;  Laterality: Right;  . EYE SURGERY     cataract surgery bilat   . IR ANGIO INTRA EXTRACRAN SEL COM CAROTID INNOMINATE BILAT MOD SED  08/07/2016  . IR ANGIO VERTEBRAL SEL SUBCLAVIAN INNOMINATE BILAT MOD SED  08/07/2016  . IR GENERIC HISTORICAL  06/01/2016   IR RADIOLOGIST EVAL & MGMT 06/01/2016 [facility]-INTERV RAD  . IR RADIOLOGIST EVAL & MGMT  05/07/2017  . LEFT HEART CATHETERIZATION  WITH CORONARY ANGIOGRAM  02/19/2004   Significant 2 vessel CAD - LAD, D1 and RI  . LEFT HEART CATHETERIZATION WITH CORONARY/GRAFT ANGIOGRAM N/A 04/28/2014   Procedure: LEFT HEART CATHETERIZATION WITH Beatrix Fetters;  Surgeon: Sinclair Grooms, MD;  Location: [facility] CATH LAB: CTO of SVG-Diag & SVG-RI, patent LIMA-LAD. Patent native circumflex, LAD and RCA. 70% stenosis in a branch of RI. --> Does not explain "high risk perfusion study "  . LEFT HEART CATHETERIZATION WITH CORONARY/GRAFT ANGIOGRAM   07/25/2006   Totally occluded SVG to diag and ramus. Patent LIMA-LAD. Ramus 70-80% proximal stenosis and occluded vein graft.  [name] left total knee replacement      2001  . NM MYOVIEW LTD  January 2008; December 2015   a. Referred for For mild inferolateral and anterolateral/apical lateral defect with mild reversibility.;; b. Large defect in the anterior and inferior wall. Suggestive of potential infarct plus ischemia. HIGH RISK.  [name] RADIOLOGY WITH ANESTHESIA N/A 08/07/2016   Procedure: EMBOLIZATION;  Surgeon: Luanne Bras, MD;  Location: [facility];  Service: Radiology;  Laterality: N/A;  . right total knee replacement      2003  . TRANSTHORACIC ECHOCARDIOGRAM  02/19/2004; December 2015   a. EF 45-50%, Normal LV function, moderate hypokinesis of anterior wall.;; b. EF 50-55%. No RWMA, GR 1 DD. Normal valves    Social History:  reports that she has never smoked. She has never used smokeless tobacco. She reports previous alcohol use of about 1.0 standard drinks of alcohol per week. She reports that she does not use drugs.   Allergies  Allergen Reactions  . Nsaids Other (See Comments)    URINARY RETENTION  . Statins Other (See Comments)    MYALGIAS HURTS ALL OVER  . Tricor [Fenofibrate] Other (See Comments)    MYALGIAS HURTS ALL OVER  . Zetia [Ezetimibe] Other (See Comments)    MYALGIAS HURTS ALL OVER  . Lescol [Fluvastatin]     Muscle pain  . Livalo [Pitavastatin]     Muscle pain  .  Niaspan [Niacin]     Severe flushing   . Relafen [Nabumetone] Rash    Family History  Problem Relation Age of Onset  . Alzheimer's disease Mother   . Alzheimer's disease Sister   . Hyperlipidemia Sister   . Hypertension Sister   . Diabetes Sister   . Hyperlipidemia Sister   . Hypertension Sister   . Pancreatitis Child     Prior to Admission medications   Medication Sig Start Date End Date Taking? Authorizing Provider  acetaminophen (TYLENOL) 500 MG tablet Take 500 mg by mouth every 4 (four) hours as needed (for pain).    Yes [provider]  aspirin EC 81 MG tablet Take 81 mg by mouth at bedtime.    Yes [provider]  atenolol (TENORMIN) 25  MG tablet Take 1 tablet (25 mg total) daily by mouth. May take an additional 25 mg of medication daily, if SBP> 160 or DBP >100 Patient taking differently: Take 25 mg by mouth at bedtime. May take an additional 25 mg of medication daily, if SBP> 160 or DBP >100 03/14/17  Yes Leonie Man, MD  brimonidine (ALPHAGAN P) 0.1 % SOLN Place 1 drop into the left eye in the morning and at bedtime.   Yes [provider]  Cholecalciferol (VITAMIN D3) 50 MCG (2000 UT) capsule Take 1 capsule (2,000 Units total) by mouth at bedtime. 04/09/19  Yes Leonie Man, MD  clopidogrel (PLAVIX) 75 MG tablet Take 1 tablet (75 mg total) by mouth daily. 05/26/19  Yes Leonie Man, MD  donepezil (ARICEPT) 5 MG tablet Take 1 tablet (5 mg total) by mouth at bedtime. 07/10/18  Yes Lomax, Amy, NP  dorzolamide-timolol (COSOPT) 22.3-6.8 MG/ML ophthalmic solution Place 1 drop into the left eye 2 (two) times daily.  11/14/17  Yes [provider]  latanoprost (XALATAN) 0.005 % ophthalmic solution Place 1 drop into both eyes at bedtime. 06/26/16  Yes [provider]  lisinopril (PRINIVIL,ZESTRIL) 20 MG tablet Take 20 mg by mouth at bedtime.    Yes [provider]   Physical Exam: Vitals:   07/15/19 1452 07/15/19 1530  07/15/19 1636 07/15/19 1729  BP: (!) 167/70 (!) 183/74 (!) 152/115 (!) 165/152  Pulse: 82  74 70  Resp: 19 14 16  (!) 21  Temp:      TempSrc:      SpO2: 99%  97%   Weight:      Height:         General exam: Alert, confuse, follows command  Head, eyes and ENT: Nontraumatic and normocephalic. Pupils equally reacting to light and accommodation. Oral mucosa moist.  Neck: Supple. No JVD, carotid bruit or thyromegaly.  Lymphatics: No lymphadenopathy.  Respiratory system: Clear to auscultation. No increased work of breathing.  Cardiovascular system: S1 and S2 heard, RRR. No JVD, murmurs, gallops, clicks or pedal edema.  Gastrointestinal system: Abdomen is nondistended, soft and nontender. Normal bowel sounds heard. No organomegaly or masses appreciated.  Central nervous system: Alert, able to move Bilateral upper extremities.   Extremities: Symmetric 5 x 5 power. Peripheral pulses symmetrically felt.   Skin: No rashes or acute findings.  Musculoskeletal system: Negative exam.  Psychiatry: Pleasant and cooperative.   Labs on Admission:  Basic Metabolic Panel: Recent Labs  Lab 07/14/19 0904 07/15/19 1237  NA 143 145  K 3.5 3.3*  CL 108 108  CO2 24 26  GLUCOSE 115* 110*  BUN 18 18  CREATININE 1.00 0.91  CALCIUM 9.4 9.2   Liver Function Tests: Recent Labs  Lab 07/14/19 0904 07/15/19 1237  AST 15 64*  ALT 9 25  ALKPHOS 74 76  BILITOT 0.7 1.0  PROT 6.0* 6.3*  ALBUMIN 3.6 3.9   Recent Labs  Lab 07/15/19 1237  LIPASE 15   No results for input(s): AMMONIA in the last 168 hours. CBC: Recent Labs  Lab 07/14/19 0904 07/15/19 1237  WBC 8.9 6.2  NEUTROABS 7.1 3.7  HGB 12.8 12.7  HCT 39.1 39.1  MCV 94.9 96.8  PLT 201 209   Cardiac Enzymes: Recent Labs  Lab 07/14/19 0904  CKTOTAL 192    BNP (last 3 results) No results for input(s): PROBNP in the last 8760 hours. CBG: No results for input(s): GLUCAP in the last 168 hours.  Radiological Exams on  Admission: DG Chest 1 View  Result Date: 07/14/2019 CLINICAL DATA:  Pain following fall EXAM: CHEST  1 VIEW COMPARISON:  August 07, 2017 FINDINGS: There is slight atelectasis in the medial left base. The lungs elsewhere are clear. Heart size and pulmonary vascularity are normal. No adenopathy. Patient is status post coronary artery bypass grafting. There is aortic atherosclerosis. No pneumothorax. There is a suspected nondisplaced fracture of the anterolateral right fifth rib. A loop recorder is present on the left inferiorly. IMPRESSION: Questionable fracture of the anterolateral right fifth rib. No pneumothorax. Mild left base atelectasis. Lungs elsewhere clear. Cardiac silhouette within normal limits and stable. Postoperative changes noted. Aortic Atherosclerosis (ICD10-I70.0). Electronically Signed   By: Lowella Grip III M.D.   On: 07/14/2019 08:49   DG Lumbar Spine Complete  Result Date: 07/14/2019 CLINICAL DATA:  Pain following fall EXAM: LUMBAR SPINE - COMPLETE 4+ VIEW COMPARISON:  None. FINDINGS: Frontal, lateral, spot lumbosacral lateral, and bilateral oblique views were obtained. There are 5 non-rib-bearing lumbar type vertebral bodies. No acute fracture is demonstrable. There is 8 mm of retrolisthesis of L2 on L3. There is 5 mm of anterolisthesis of L4 on L5. There is 4 mm of retrolisthesis of L1 on L2. There is moderately severe disc space narrowing at L4-5 and L5-S1. There is moderate disc space narrowing at L1-2 and L2-3. There are anterior osteophytes at all levels. There is facet osteoarthritic change at all levels bilaterally. There is aortic atherosclerosis. There are calcifications in the right upper quadrant. There is a fracture of the inferior-most sternal wire. IMPRESSION: No appreciable fracture. Spondylolisthesis at several levels, likely due to underlying spondylosis. Multiple foci disc space narrowing and diffuse bilateral facet osteoarthritic change throughout the lumbar region.  Apparent cholelithiasis. Aortic Atherosclerosis (ICD10-I70.0). Electronically Signed   By: Lowella Grip III M.D.   On: 07/14/2019 08:47   CT Head Wo Contrast  Result Date: 07/14/2019 CLINICAL DATA:  Fall. Blunt head trauma. On anticoagulation. EXAM: CT HEAD WITHOUT CONTRAST TECHNIQUE: Contiguous axial images were obtained from the base of the skull through the vertex without intravenous contrast. COMPARISON:  08/07/2017 FINDINGS: Brain: No evidence of acute infarction, hemorrhage, hydrocephalus, extra-axial collection, or mass lesion/mass effect. Mild diffuse cerebral atrophy and moderate chronic small vessel disease is again seen. Old lacunar infarcts are noted in the right superior cerebellum and left centrum semiovale. Vascular:  No hyperdense vessel or other acute findings. Skull: No evidence of fracture or other significant bone abnormality. Sinuses/Orbits:  No acute findings. Other: None. IMPRESSION: 1. No acute intracranial abnormality. 2. Stable cerebral atrophy, chronic small vessel disease, and old lacunar infarcts. Electronically Signed   By: Marlaine Hind M.D.   On: 07/14/2019 08:21   CT ABDOMEN PELVIS W CONTRAST  Result Date: 07/15/2019 CLINICAL DATA:  Abdominal pain EXAM: CT ABDOMEN AND PELVIS WITH CONTRAST TECHNIQUE: Multidetector CT imaging of the abdomen and pelvis was performed using the standard protocol following bolus administration of intravenous contrast. CONTRAST:  112mL OMNIPAQUE IOHEXOL 300 MG/ML  SOLN COMPARISON:  None. FINDINGS: Lower chest: Bibasilar atelectasis/scarring. Hepatobiliary: No focal liver lesion. Cholelithiasis. No ductal dilatation. Pancreas: Atrophic.  Otherwise unremarkable. Spleen: Unremarkable. Adrenals/Urinary Tract: Upper pole right renal cyst. No hydronephrosis. Bladder is unremarkable. Stomach/Bowel: Stomach is within normal limits. Bowel is normal in caliber. Appendix is not visualized. Vascular/Lymphatic: Aortic atherosclerosis. No enlarged abdominal  or pelvic lymph nodes. Reproductive: Uterus and bilateral adnexa are unremarkable. Other: No abdominal wall hernia or abnormality. No abdominopelvic ascites. Musculoskeletal:  Soft tissue swelling/hematoma adjacent to the left greater trochanter and lateral thigh. Advanced degenerative changes of the lumbar spine. IMPRESSION: Cholelithiasis. Soft tissue swelling/hematoma adjacent to left hip and lateral thigh. Electronically Signed   By: Macy Mis M.D.   On: 07/15/2019 14:27   DG Hip Unilat W or Wo Pelvis 2-3 Views Left  Result Date: 07/14/2019 CLINICAL DATA:  Pain following fall EXAM: DG HIP (WITH OR WITHOUT PELVIS) 2-3V LEFT COMPARISON:  None. FINDINGS: Frontal pelvis as well as frontal and lateral left hip images were obtained. No acute fracture or dislocation. There is moderate symmetric narrowing of each hip joint. No erosive change. There is narrowing of the pubic symphysis. There is apparent postoperative change in the right greater trochanter region. IMPRESSION: Moderate symmetric narrowing of each hip joint and pubic symphysis. No fracture or dislocation. Electronically Signed   By: Lowella Grip III M.D.   On: 07/14/2019 08:45    EKG: Independently reviewed. Sinus arrhythmia, PVC.   Assessment/Plan Active Problems:   CAD (coronary artery disease), autologous vein bypass graft   Essential hypertension   Hyperlipidemia LDL goal <70   Hypokalemia   Hip pain   1-Left Hip Hematoma, Pain;  Admit for pain controlled, avoid IV narcotics patient became confuse with low dose fentanyl.  Will schedule oral tylenol.  PRN tramadol for severe pain.  Repeat Hb in am, to monitor for bleeding.  PT evaluation for disposition.   2-Delirium; related to IV fentanyl.  Avoid narcotics.  Delirium precaution.   3-CAD; hold plavis and aspirin due to hip hematoma. Resume if hb stable tomorrow   4-HTN; continue with lisinopril and atenolol.  PRN hydralazine. 5-Dementia; continue with Aricept.     6-Glaucoma; Continue with eye drops.  7-Mild elevation of troponin. Currently denies chest pain  8-Hypokalemia; received 40 meq in the ed.    DVT Prophylaxis: SCD, no anticoagulation due to hematoma Code Status: Full code, discussed with Granddaughter  Family Communication: Plan discussed with granddaughter.  Disposition Plan: admit under observation for pain controlled, PT evaluation, safe plan  for discharge and patient develop delirium after pain medication.   Time spent: 75 minutes     Elmarie Shiley MD [facility]   07/15/2019, 5:53 PM

## 2019-07-16 ENCOUNTER — Telehealth: Payer: Self-pay | Admitting: Surgery

## 2019-07-16 ENCOUNTER — Encounter (HOSPITAL_COMMUNITY): Payer: Self-pay | Admitting: Internal Medicine

## 2019-07-16 DIAGNOSIS — I700 Atherosclerosis of aorta: Secondary | ICD-10-CM | POA: Diagnosis not present

## 2019-07-16 DIAGNOSIS — Z8673 Personal history of transient ischemic attack (TIA), and cerebral infarction without residual deficits: Secondary | ICD-10-CM | POA: Diagnosis not present

## 2019-07-16 DIAGNOSIS — Z8249 Family history of ischemic heart disease and other diseases of the circulatory system: Secondary | ICD-10-CM | POA: Diagnosis not present

## 2019-07-16 DIAGNOSIS — W19XXXD Unspecified fall, subsequent encounter: Secondary | ICD-10-CM | POA: Diagnosis not present

## 2019-07-16 DIAGNOSIS — E538 Deficiency of other specified B group vitamins: Secondary | ICD-10-CM | POA: Diagnosis not present

## 2019-07-16 DIAGNOSIS — Z833 Family history of diabetes mellitus: Secondary | ICD-10-CM | POA: Diagnosis not present

## 2019-07-16 DIAGNOSIS — I25810 Atherosclerosis of coronary artery bypass graft(s) without angina pectoris: Secondary | ICD-10-CM | POA: Diagnosis not present

## 2019-07-16 DIAGNOSIS — Y9301 Activity, walking, marching and hiking: Secondary | ICD-10-CM | POA: Diagnosis present

## 2019-07-16 DIAGNOSIS — F0390 Unspecified dementia without behavioral disturbance: Secondary | ICD-10-CM

## 2019-07-16 DIAGNOSIS — R278 Other lack of coordination: Secondary | ICD-10-CM | POA: Diagnosis not present

## 2019-07-16 DIAGNOSIS — Z743 Need for continuous supervision: Secondary | ICD-10-CM | POA: Diagnosis not present

## 2019-07-16 DIAGNOSIS — M25552 Pain in left hip: Secondary | ICD-10-CM | POA: Diagnosis not present

## 2019-07-16 DIAGNOSIS — I252 Old myocardial infarction: Secondary | ICD-10-CM | POA: Diagnosis not present

## 2019-07-16 DIAGNOSIS — R0902 Hypoxemia: Secondary | ICD-10-CM | POA: Diagnosis not present

## 2019-07-16 DIAGNOSIS — K8020 Calculus of gallbladder without cholecystitis without obstruction: Secondary | ICD-10-CM | POA: Diagnosis present

## 2019-07-16 DIAGNOSIS — S7002XA Contusion of left hip, initial encounter: Secondary | ICD-10-CM | POA: Diagnosis present

## 2019-07-16 DIAGNOSIS — Z87442 Personal history of urinary calculi: Secondary | ICD-10-CM | POA: Diagnosis not present

## 2019-07-16 DIAGNOSIS — Z7902 Long term (current) use of antithrombotics/antiplatelets: Secondary | ICD-10-CM | POA: Diagnosis not present

## 2019-07-16 DIAGNOSIS — R279 Unspecified lack of coordination: Secondary | ICD-10-CM | POA: Diagnosis not present

## 2019-07-16 DIAGNOSIS — E785 Hyperlipidemia, unspecified: Secondary | ICD-10-CM

## 2019-07-16 DIAGNOSIS — I10 Essential (primary) hypertension: Secondary | ICD-10-CM | POA: Diagnosis present

## 2019-07-16 DIAGNOSIS — M6281 Muscle weakness (generalized): Secondary | ICD-10-CM | POA: Diagnosis not present

## 2019-07-16 DIAGNOSIS — R2681 Unsteadiness on feet: Secondary | ICD-10-CM | POA: Diagnosis not present

## 2019-07-16 DIAGNOSIS — I493 Ventricular premature depolarization: Secondary | ICD-10-CM | POA: Diagnosis present

## 2019-07-16 DIAGNOSIS — Z951 Presence of aortocoronary bypass graft: Secondary | ICD-10-CM | POA: Diagnosis not present

## 2019-07-16 DIAGNOSIS — I2510 Atherosclerotic heart disease of native coronary artery without angina pectoris: Secondary | ICD-10-CM | POA: Diagnosis present

## 2019-07-16 DIAGNOSIS — E876 Hypokalemia: Secondary | ICD-10-CM | POA: Diagnosis present

## 2019-07-16 DIAGNOSIS — S7002XD Contusion of left hip, subsequent encounter: Secondary | ICD-10-CM | POA: Diagnosis not present

## 2019-07-16 DIAGNOSIS — Z20822 Contact with and (suspected) exposure to covid-19: Secondary | ICD-10-CM | POA: Diagnosis present

## 2019-07-16 DIAGNOSIS — Y92239 Unspecified place in hospital as the place of occurrence of the external cause: Secondary | ICD-10-CM | POA: Diagnosis present

## 2019-07-16 DIAGNOSIS — M25559 Pain in unspecified hip: Secondary | ICD-10-CM

## 2019-07-16 DIAGNOSIS — W010XXA Fall on same level from slipping, tripping and stumbling without subsequent striking against object, initial encounter: Secondary | ICD-10-CM | POA: Diagnosis present

## 2019-07-16 DIAGNOSIS — K8080 Other cholelithiasis without obstruction: Secondary | ICD-10-CM | POA: Diagnosis not present

## 2019-07-16 DIAGNOSIS — H401130 Primary open-angle glaucoma, bilateral, stage unspecified: Secondary | ICD-10-CM | POA: Diagnosis not present

## 2019-07-16 DIAGNOSIS — Z8349 Family history of other endocrine, nutritional and metabolic diseases: Secondary | ICD-10-CM | POA: Diagnosis not present

## 2019-07-16 DIAGNOSIS — Z820 Family history of epilepsy and other diseases of the nervous system: Secondary | ICD-10-CM | POA: Diagnosis not present

## 2019-07-16 DIAGNOSIS — R41841 Cognitive communication deficit: Secondary | ICD-10-CM | POA: Diagnosis not present

## 2019-07-16 DIAGNOSIS — H409 Unspecified glaucoma: Secondary | ICD-10-CM | POA: Diagnosis present

## 2019-07-16 DIAGNOSIS — Z7982 Long term (current) use of aspirin: Secondary | ICD-10-CM | POA: Diagnosis not present

## 2019-07-16 DIAGNOSIS — M545 Low back pain: Secondary | ICD-10-CM | POA: Diagnosis present

## 2019-07-16 DIAGNOSIS — I6930 Unspecified sequelae of cerebral infarction: Secondary | ICD-10-CM | POA: Diagnosis not present

## 2019-07-16 DIAGNOSIS — R52 Pain, unspecified: Secondary | ICD-10-CM | POA: Diagnosis not present

## 2019-07-16 DIAGNOSIS — Z85828 Personal history of other malignant neoplasm of skin: Secondary | ICD-10-CM | POA: Diagnosis not present

## 2019-07-16 DIAGNOSIS — Z9842 Cataract extraction status, left eye: Secondary | ICD-10-CM | POA: Diagnosis not present

## 2019-07-16 DIAGNOSIS — Z79899 Other long term (current) drug therapy: Secondary | ICD-10-CM | POA: Diagnosis not present

## 2019-07-16 LAB — BASIC METABOLIC PANEL
Anion gap: 8 (ref 5–15)
BUN: 15 mg/dL (ref 8–23)
CO2: 23 mmol/L (ref 22–32)
Calcium: 8.9 mg/dL (ref 8.9–10.3)
Chloride: 109 mmol/L (ref 98–111)
Creatinine, Ser: 0.81 mg/dL (ref 0.44–1.00)
GFR calc Af Amer: >60 mL/min (ref >60)
GFR calc non Af Amer: >60 mL/min (ref >60)
Glucose, Bld: 86 mg/dL (ref 70–99)
Potassium: 3.8 mmol/L (ref 3.5–5.1)
Sodium: 140 mmol/L (ref 135–145)

## 2019-07-16 LAB — CBC
HCT: 39.5 % (ref 36.0–46.0)
Hemoglobin: 12.5 g/dL (ref 12.0–15.0)
MCH: 31.3 pg (ref 26.0–34.0)
MCHC: 31.6 g/dL (ref 30.0–36.0)
MCV: 98.8 fL (ref 80.0–100.0)
Platelets: 203 10*3/uL (ref 150–400)
RBC: 4 MIL/uL (ref 3.87–5.11)
RDW: 13 % (ref 11.5–15.5)
WBC: 5.4 10*3/uL (ref 4.0–10.5)
nRBC: 0 % (ref 0.0–0.2)

## 2019-07-16 MED ORDER — ASPIRIN EC 81 MG PO TBEC
81.0000 mg | DELAYED_RELEASE_TABLET | Freq: Every day | ORAL | Status: DC
  Administered 2019-07-16 – 2019-07-17 (×2): 81 mg via ORAL
  Filled 2019-07-16 (×2): qty 1

## 2019-07-16 MED ORDER — CLOPIDOGREL BISULFATE 75 MG PO TABS
75.0000 mg | ORAL_TABLET | Freq: Every day | ORAL | Status: DC
  Administered 2019-07-16 – 2019-07-18 (×3): 75 mg via ORAL
  Filled 2019-07-16 (×3): qty 1

## 2019-07-16 NOTE — Telephone Encounter (Signed)
Addendum: 05/16/2019 16:25 ED CM received call from patient's granddaughter [name], concerning patient being discharged home and unable to ambulate, she states she is unable to get patient out of car.  She denies having a w/c at home. CM offered assistance with having EDP order a w/c for home use, she is agreeable. CM asked about [place] services, she denies having [place] services. CM discussed [place] agencies, offered choice to granddaughter she does not have a preference but needs start of care as early as tomorrow, CM contacted [facility], spoke with [facility] referral accepted  start of care will be tomorrow.

## 2019-07-16 NOTE — TOC Progression Note (Signed)
Transition of Care [facility]) - Progression Note    Patient Details  Name: TOMECO ZECHER MRN: [id number] Date of Birth: 1931-06-26  [facility] CM/SW [place], [place] Phone Number: 07/16/2019, 4:35 PM  Clinical Narrative:   Spoke to granddaughter who is in favor of patient going to rehab.  She gave me a list of preferred providers.  Bed search sent. TOC will continue to follow during the course of hospitalization.     Expected Discharge Plan: Skilled Nursing Facility Barriers to Discharge: SNF Pending bed offer  Expected Discharge Plan and Services Expected Discharge Plan: [place]   Discharge Planning Services: CM Consult   Living arrangements for the past 2 months: Single Family Home                                       Social Determinants of Health (SDOH) Interventions    Readmission Risk Interventions No flowsheet data found.

## 2019-07-16 NOTE — TOC Initial Note (Signed)
Transition of Care [facility]) - Initial/Assessment Note    Patient Details  Name: [name] MRN: [id number] Date of Birth: [birth year]  [facility] CM/SW Contact:    Trish Mage, LCSW Phone Number: 07/16/2019, 12:13 PM  Clinical Narrative:   Met with patient in follow up to PT/OT recommendation of SNF for rehab. Found her sitting in chair, fiddling with telemetry leads.  Redirectable.  Stated she is open to going to rehab if that is the recommendation, then almost immediately stated she is waiting for her granddaughter to take her home.  Made several other comments giving evidence of pleasant confusion.  Gave permission for me to call granddaughter.  Left message.  TOC will continue to follow during the course of hospitalization.                 Expected Discharge Plan: Skilled Nursing Facility Barriers to Discharge: SNF Pending bed offer   Patient Goals and CMS Choice        Expected Discharge Plan and Services Expected Discharge Plan: [place]   Discharge Planning Services: CM Consult   Living arrangements for the past 2 months: Single Family Home                                      Prior Living Arrangements/Services Living arrangements for the past 2 months: Single Family Home Lives with:: Relatives Patient language and need for interpreter reviewed:: Yes Do you feel safe going back to the place where you live?: Yes      Need for Family Participation in Patient Care: Yes (Comment) Care giver support system in place?: Yes (comment) Current home services: DME Criminal Activity/Legal Involvement Pertinent to Current Situation/Hospitalization: No - Comment as needed  Activities of Daily Living Home Assistive Devices/Equipment: Walker (specify type) ADL Screening (condition at time of admission) Patient's cognitive ability adequate to safely complete daily activities?: Yes Is the patient deaf or have difficulty hearing?: Yes Does  the patient have difficulty seeing, even when wearing glasses/contacts?: Yes Does the patient have difficulty concentrating, remembering, or making decisions?: Yes Patient able to express need for assistance with ADLs?: Yes Does the patient have difficulty dressing or bathing?: No Independently performs ADLs?: Yes (appropriate for developmental age) Does the patient have difficulty walking or climbing stairs?: Yes Weakness of Legs: Left Weakness of Arms/Hands: None  Permission Sought/Granted Permission sought to share information with : Family Supports Permission granted to share information with : Yes, Verbal Permission Granted  Share Information with NAME: Jaylenn Baiza     Permission granted to share info w Relationship: granddaughter  Permission granted to share info w Contact Information: [phone]  Emotional Assessment Appearance:: Appears stated age Attitude/Demeanor/Rapport: Engaged Affect (typically observed): Appropriate Orientation: : Oriented to Self, Oriented to Place Alcohol / Substance Use: Not Applicable Psych Involvement: No (comment)  Admission diagnosis:  Hip pain [M25.559] Contusion of left hip, initial encounter [S70.02XA] Patient Active Problem List   Diagnosis Date Noted  . Hip pain 07/15/2019  . Memory loss 07/09/2018  . Vitamin B12 deficiency 07/09/2018  . Hypertensive urgency 10/10/2016  . Hypokalemia 10/10/2016  . Migraine equivalent   . Aneurysm, cerebral, nonruptured   . TIA (transient ischemic attack) 04/05/2016  . Cerebellar infarct ([place]) 02/16/2016  . Stroke (cerebrum) ([place]) 01/21/2016  . Neurological deficit present 01/21/2016  . Greater trochanteric bursitis of right hip 06/15/2015  .  Hyperlipidemia LDL goal <70 05/14/2013  . Statin intolerance   . Essential hypertension   . CAD (coronary artery disease), autologous vein bypass graft 06/30/2006  . S/P CABG x 3 01/30/2004  . Non-STEMI (non-ST elevated myocardial infarction) ([place])  01/30/2004   PCP:  Carol Ada, MD Pharmacy:   [id number] [place] [id number]: [phone]: [phone]    Social Determinants of Health (SDOH) Interventions    Readmission Risk Interventions No flowsheet data found.

## 2019-07-16 NOTE — Progress Notes (Signed)
Patient suffers from L hip hematoma, unsteady gait, h/o fall which impairs their ability to perform daily activities like walking in the home.  A walker alone will not resolve the issues with performing activities of daily living. A lightweight wheelchair will allow patient to safely perform daily activities.  The patient can self propel in the home or has a caregiver who can provide assistance.     Blondell Reveal Kistler PT 07/16/2019  Acute Rehabilitation Services Pager [phone] Office [phone]

## 2019-07-16 NOTE — Progress Notes (Signed)
PROGRESS NOTE  [name] [id number] DOB: [birth year] DOA: 07/15/2019 PCP: Carol Ada, MD  HPI/Recap of past 24 hours: HPI from [name] is a 86 y.o. female with past medical history significant for CAD status post bypass, glaucoma, hypertension who presents with persistent left hip pain and inability to walk. Of note patient was evaluated at [facility] day prior to this admission after she tripped and fell.  She was walking and tripped into a dog crate. She  fell backward in the garage.  She reports pain in her left leg, described as painful and heavy.  After she was discharged from the ED, pt was not able to get out of the car according to the daughter. Pt was noted to be confused and unable to walk. In the ED, labs showed electrolytes normal other than low potassium at 3.3, AST 64, troponin 25, UA negative.  CT abdomen and pelvis: Cholelithiasis. Soft tissue swelling/hematoma adjacent to left hip and lateral thigh.  Patient admitted for further management.    Today, pt noted to be pleasantly confused, still with some L hip tenderness upon movement. Denies any chest pain, SOB, abdominal pain, N/V/F/C.  Assessment/Plan: Active Problems:   CAD (coronary artery disease), autologous vein bypass graft   Essential hypertension   Hyperlipidemia LDL goal <70   Hypokalemia   Hip pain  Left hip hematoma Likely 2/2 from recent fall, no fractures noted on x-ray hip/CT A/P CT head with no acute changes Continue pain management PT/OT requiring SNF  Hypertension Continue lisinopril, atenolol, as needed hydralazine  CAD Currently chest pain-free Continue Plavix, aspirin as hemoglobin has remained stable  Glaucoma Continue with eyedrops  Dementia Pleasantly confused Continue with Aricept            Malnutrition Type:      Malnutrition Characteristics:      Nutrition Interventions:       Estimated body mass index is 20.18  kg/m as calculated from the following:   Height as of this encounter: 5\' 5"  (1.651 m).   Weight as of this encounter: 55 kg.     Code Status: Full  Family Communication: Attempted to reach grand-daughter, no answer   Disposition Plan: Patient coming from home. PT recommending SNF   Consultants:  None  Procedures:  None  Antimicrobials:  None  DVT prophylaxis: SCD   Objective: Vitals:   07/15/19 1729 07/15/19 1812 07/15/19 2008 07/16/19 0546  BP: (!) 165/152 (!) 188/74 (!) 164/72 (!) 153/77  Pulse: 70 72 68 (!) 55  Resp: (!) 21 18 17 17   Temp:  98.4 F (36.9 C) 98.7 F (37.1 C) 97.9 F (36.6 C)  TempSrc:  Oral  Oral  SpO2:  100% 97% 100%  Weight:      Height:        Intake/Output Summary (Last 24 hours) at 07/16/2019 1210 Last data filed at 07/16/2019 0300 Gross per 24 hour  Intake 535.06 ml  Output 400 ml  Net 135.06 ml   Filed Weights   07/15/19 1159  Weight: 55 kg    Exam:  General: NAD, pleasantly confused  Cardiovascular: S1, S2 present  Respiratory: CTAB  Abdomen: Soft, nontender, nondistended, bowel sounds present  Musculoskeletal: No bilateral pedal edema noted, hematoma/bruising noted on left lower leg  Skin:  As noted above  Psychiatry:  Unable to assess    Data Reviewed: CBC: Recent Labs  Lab 07/14/19 0904 07/15/19 1237 07/16/19 0535  WBC 8.9 6.2 5.4  NEUTROABS 7.1 3.7  --   HGB 12.8 12.7 12.5  HCT 39.1 39.1 39.5  MCV 94.9 96.8 98.8  PLT 201 209 [id number]   Basic Metabolic Panel: Recent Labs  Lab 07/14/19 0904 07/15/19 1237 07/16/19 0535  NA 143 145 140  K 3.5 3.3* 3.8  CL 108 108 109  CO2 24 26 23   GLUCOSE 115* 110* 86  BUN 18 18 15   CREATININE 1.00 0.91 0.81  CALCIUM 9.4 9.2 8.9   GFR: Estimated Creatinine Clearance: 43.3 mL/min (by C-G formula based on SCr of 0.81 mg/dL). Liver Function Tests: Recent Labs  Lab 07/14/19 0904 07/15/19 1237  AST 15 64*  ALT 9 25  ALKPHOS 74 76  BILITOT 0.7 1.0  PROT  6.0* 6.3*  ALBUMIN 3.6 3.9   Recent Labs  Lab 07/15/19 1237  LIPASE 15   No results for input(s): AMMONIA in the last 168 hours. Coagulation Profile: No results for input(s): INR, PROTIME in the last 168 hours. Cardiac Enzymes: Recent Labs  Lab 07/14/19 0904  CKTOTAL 192   BNP (last 3 results) No results for input(s): PROBNP in the last 8760 hours. HbA1C: No results for input(s): HGBA1C in the last 72 hours. CBG: No results for input(s): GLUCAP in the last 168 hours. Lipid Profile: No results for input(s): CHOL, HDL, LDLCALC, TRIG, CHOLHDL, LDLDIRECT in the last 72 hours. Thyroid Function Tests: No results for input(s): TSH, T4TOTAL, FREET4, T3FREE, THYROIDAB in the last 72 hours. Anemia Panel: No results for input(s): VITAMINB12, FOLATE, FERRITIN, TIBC, IRON, RETICCTPCT in the last 72 hours. Urine analysis:    Component Value Date/Time   COLORURINE YELLOW 07/15/2019 1504   APPEARANCEUR CLEAR 07/15/2019 1504   APPEARANCEUR Clear 01/27/2016 1115   LABSPEC 1.033 (H) 07/15/2019 1504   PHURINE 5.0 07/15/2019 1504   GLUCOSEU NEGATIVE 07/15/2019 1504   HGBUR NEGATIVE 07/15/2019 1504   BILIRUBINUR NEGATIVE 07/15/2019 1504   BILIRUBINUR Negative 01/27/2016 1115   KETONESUR NEGATIVE 07/15/2019 1504   PROTEINUR NEGATIVE 07/15/2019 1504   NITRITE NEGATIVE 07/15/2019 1504   LEUKOCYTESUR NEGATIVE 07/15/2019 1504   Sepsis Labs: @LABRCNTIP (procalcitonin:4,lacticidven:4)  ) Recent Results (from the past 240 hour(s))  SARS CORONAVIRUS 2 (TAT 6-24 HRS) Nasopharyngeal Nasopharyngeal Swab     Status: None   Collection Time: 07/15/19  5:24 PM   Specimen: Nasopharyngeal Swab  Result Value Ref Range Status   SARS Coronavirus 2 NEGATIVE NEGATIVE Final    Comment: (NOTE) SARS-CoV-2 target nucleic acids are NOT DETECTED. The SARS-CoV-2 RNA is generally detectable in upper and lower respiratory specimens during the acute phase of infection. Negative results do not preclude SARS-CoV-2  infection, do not rule out co-infections with other pathogens, and should not be used as the sole basis for treatment or other patient management decisions. Negative results must be combined with clinical observations, patient history, and epidemiological information. The expected result is Negative. Fact Sheet for Patients: SugarRoll.be Fact Sheet for Healthcare Providers: https://www.woods-mathews.com/ This test is not yet approved or cleared by the [place] FDA and  has been authorized for detection and/or diagnosis of SARS-CoV-2 by FDA under an Emergency Use Authorization (EUA). This EUA will remain  in effect (meaning this test can be used) for the duration of the COVID-19 declaration under Section 56 4(b)(1) of the Act, 21 U.S.C. section 360bbb-3(b)(1), unless the authorization is terminated or revoked sooner. Performed at [facility], [place] [address], [place], [place] [address]       Studies: CT ABDOMEN PELVIS W  CONTRAST  Result Date: 07/15/2019 CLINICAL DATA:  Abdominal pain EXAM: CT ABDOMEN AND PELVIS WITH CONTRAST TECHNIQUE: Multidetector CT imaging of the abdomen and pelvis was performed using the standard protocol following bolus administration of intravenous contrast. CONTRAST:  145mL OMNIPAQUE IOHEXOL 300 MG/ML  SOLN COMPARISON:  None. FINDINGS: Lower chest: Bibasilar atelectasis/scarring. Hepatobiliary: No focal liver lesion. Cholelithiasis. No ductal dilatation. Pancreas: Atrophic.  Otherwise unremarkable. Spleen: Unremarkable. Adrenals/Urinary Tract: Upper pole right renal cyst. No hydronephrosis. Bladder is unremarkable. Stomach/Bowel: Stomach is within normal limits. Bowel is normal in caliber. Appendix is not visualized. Vascular/Lymphatic: Aortic atherosclerosis. No enlarged abdominal or pelvic lymph nodes. Reproductive: Uterus and bilateral adnexa are unremarkable. Other: No abdominal wall hernia or abnormality. No  abdominopelvic ascites. Musculoskeletal: Soft tissue swelling/hematoma adjacent to the left greater trochanter and lateral thigh. Advanced degenerative changes of the lumbar spine. IMPRESSION: Cholelithiasis. Soft tissue swelling/hematoma adjacent to left hip and lateral thigh. Electronically Signed   By: Macy Mis M.D.   On: 07/15/2019 14:27    Scheduled Meds: . acetaminophen  500 mg Oral TID  . atenolol  25 mg Oral QHS  . brimonidine  1 drop Left Eye BID  . cholecalciferol  2,000 Units Oral QHS  . donepezil  5 mg Oral QHS  . dorzolamide-timolol  1 drop Left Eye BID  . latanoprost  1 drop Both Eyes QHS  . lisinopril  20 mg Oral QHS  . senna  1 tablet Oral BID    Continuous Infusions: . sodium chloride 50 mL/hr at 07/16/19 0300     LOS: 0 days     Alma Friendly, MD [facility]  If 7PM-7AM, please contact night-coverage [email or web address] 07/16/2019, 12:10 PM

## 2019-07-16 NOTE — NC FL2 (Signed)
[place] LEVEL OF CARE SCREENING TOOL     IDENTIFICATION  Patient Name: [name] Birthdate: [birth year] Sex: female Admission Date (Current Location): 07/15/2019  [facility] and [place] Number:  Herbalist and Address:  [facility],  [address], [place]      Provider Number: [id number]  Attending Physician Name and Address:  Alma Friendly, MD  Relative Name and Phone Number:  Shontina Cisneroz, granddaughter, [phone]    Current Level of Care: Hospital Recommended Level of Care: [place] Prior Approval Number:    Date Approved/Denied:   PASRR Number: [id number]  Discharge Plan: SNF    Current Diagnoses: Patient Active Problem List   Diagnosis Date Noted  . Hip pain 07/15/2019  . Memory loss 07/09/2018  . Vitamin B12 deficiency 07/09/2018  . Hypertensive urgency 10/10/2016  . Hypokalemia 10/10/2016  . Migraine equivalent   . Aneurysm, cerebral, nonruptured   . TIA (transient ischemic attack) 04/05/2016  . Cerebellar infarct ([place]) 02/16/2016  . Stroke (cerebrum) ([place]) 01/21/2016  . Neurological deficit present 01/21/2016  . Greater trochanteric bursitis of right hip 06/15/2015  . Hyperlipidemia LDL goal <70 05/14/2013  . Statin intolerance   . Essential hypertension   . CAD (coronary artery disease), autologous vein bypass graft 06/30/2006  . S/P CABG x 3 01/30/2004  . Non-STEMI (non-ST elevated myocardial infarction) ([place]) 01/30/2004    Orientation RESPIRATION BLADDER Height & Weight     Self, Place  Normal External catheter Weight: 55 kg Height:  5\' 5"  (165.1 cm)  BEHAVIORAL SYMPTOMS/MOOD NEUROLOGICAL BOWEL NUTRITION STATUS  (none) (none) Continent Diet(see d/c summary)  AMBULATORY STATUS COMMUNICATION OF NEEDS Skin   Extensive Assist Verbally Normal                       Personal Care Assistance Level of Assistance  Bathing, Feeding, Dressing Bathing Assistance:  Maximum assistance Feeding assistance: Independent Dressing Assistance: Limited assistance     Functional Limitations Info  Sight, Hearing, Speech Sight Info: Adequate Hearing Info: Adequate Speech Info: Adequate    SPECIAL CARE FACTORS FREQUENCY  PT (By licensed PT), OT (By licensed OT)     PT Frequency: 5X/W OT Frequency: 5X/W            Contractures Contractures Info: Not present    Additional Factors Info  Code Status, Allergies Code Status Info: Full code Allergies Info: Nsaids, Statins, Tricore, Zetia, Lescol, Livalo, Niaspan, Relafen           Current Medications (07/16/2019):  This is the current hospital active medication list Current Facility-Administered Medications  Medication Dose Route Frequency Provider Last Rate Last Admin  . acetaminophen (TYLENOL) tablet 500 mg  500 mg Oral TID Regalado, Belkys A, MD   500 mg at 07/16/19 1009  . aspirin EC tablet 81 mg  81 mg Oral QHS Alma Friendly, MD      . atenolol (TENORMIN) tablet 25 mg  25 mg Oral QHS Regalado, Belkys A, MD   25 mg at 07/15/19 2242  . brimonidine (ALPHAGAN) 0.15 % ophthalmic solution 1 drop  1 drop Left Eye BID Regalado, Belkys A, MD   1 drop at 07/16/19 1010  . cholecalciferol (VITAMIN D) tablet 2,000 Units  2,000 Units Oral QHS Regalado, Belkys A, MD   2,000 Units at 07/15/19 2329  . clopidogrel (PLAVIX) tablet 75 mg  75 mg Oral Daily Alma Friendly, MD  75 mg at 07/16/19 1448  . donepezil (ARICEPT) tablet 5 mg  5 mg Oral QHS Regalado, Belkys A, MD   5 mg at 07/15/19 2242  . dorzolamide-timolol (COSOPT) 22.3-6.8 MG/ML ophthalmic solution 1 drop  1 drop Left Eye BID Regalado, Belkys A, MD   1 drop at 07/16/19 1010  . hydrALAZINE (APRESOLINE) tablet 10 mg  10 mg Oral Q8H PRN Regalado, Belkys A, MD      . latanoprost (XALATAN) 0.005 % ophthalmic solution 1 drop  1 drop Both Eyes QHS Regalado, Belkys A, MD   1 drop at 07/15/19 2241  . lisinopril (ZESTRIL) tablet 20 mg  20 mg Oral QHS  Regalado, Belkys A, MD   20 mg at 07/15/19 2242  . ondansetron (ZOFRAN) tablet 4 mg  4 mg Oral Q6H PRN Regalado, Belkys A, MD       Or  . ondansetron (ZOFRAN) injection 4 mg  4 mg Intravenous Q6H PRN Regalado, Belkys A, MD      . senna (SENOKOT) tablet 8.6 mg  1 tablet Oral BID Regalado, Belkys A, MD   8.6 mg at 07/16/19 1009  . traMADol (ULTRAM) tablet 25 mg  25 mg Oral Q6H PRN Regalado, Belkys A, MD         Discharge Medications: Please see discharge summary for a list of discharge medications.  Relevant Imaging Results:  Relevant Lab Results:   Additional Information [place], [place]

## 2019-07-16 NOTE — Progress Notes (Signed)
Occupational Therapy Evaluation  Clinical Impression: Pt reports living home with granddaughter who has been working from home. Pt reports performing all self-care tasks and functional mobility with Independence at PLOF. Pt is a high fall risk due to decreased sitting and standing balance skills resulting in an increase in assist with LB dressing, bathing, and toileting. Patient requires verbal cues for proper sequencing with use of FWW during functional mobility. Pt was not able to maintain proper static/dynamic standing balance using RW with one hand to clean periareas and pulling up of underwear. Pt will benefit from continued skilled acute OT services to decrease level of assist upon d/c. If family is able to provide 24 hour caregiver assist at home, recommend HHOT and BSC. If not, recommend SNF for rehab services.    07/16/19 1036  OT Visit Information  Assistance Needed +2  PT/OT/SLP Co-Evaluation/Treatment Yes  Reason for Co-Treatment Complexity of the patient's impairments (multi-system involvement);Necessary to address cognition/behavior during functional activity;For patient/therapist safety;To address functional/ADL transfers  PT goals addressed during session Mobility/safety with mobility;Balance;Proper use of DME  OT goals addressed during session ADL's and self-care  History of Present Illness 86 y.o. female  past medical history significant for CAD status post bypass, dementia, glaucoma, hypertension who presents with persistent left hip pain and inability to walk. Dx L hip hematoma.  Precautions  Precautions Fall  Restrictions  Weight Bearing Restrictions No  Home Living  Family/patient expects to be discharged to: Private residence  Living Arrangements Other relatives (Granddaughter)  Available Help at Discharge Available PRN/intermittently  Type of [place] Access Level entry  Home Layout Two level;Able to live on main level with bedroom/bathroom  Medical laboratory scientific officer Yes  How Accessible Accessible via walker  Mayflower seat;Grab bars - tub/shower  Additional Comments lives with granddaughter who is a Pharmacist, hospital, pt is poor historian, unclear if she has stairs in home as she gave conflicting answers  Prior Function  Level of Independence Independent  Communication  Communication HOH  Pain Assessment  Pain Assessment 0-10  Pain Score 4  Pain Location left hip during mobility  Pain Descriptors / Indicators Discomfort  Pain Intervention(s) Limited activity within patient's tolerance  Cognition  Arousal/Alertness Awake/alert  Behavior During Therapy WFL for tasks assessed/performed  Overall Cognitive Status No family/caregiver present to determine baseline cognitive functioning  General Comments A & O to name, place and situation (difficulty with memory recall, problem solving and safety )  Upper Extremity Assessment  Upper Extremity Assessment RUE deficits/detail;LUE deficits/detail  RUE  (Grossly 3+/5, AROM WFL)  LUE  (Grossly 3+/5, AROM WFL)  Lower Extremity Assessment  Lower Extremity Assessment Defer to PT evaluation  ADL  Overall ADL's  Needs assistance/impaired  Eating/Feeding Set up  Grooming Oral care;Wash/dry face;Wash/dry hands;Set up;Sitting  Upper Body Bathing Set up  Lower Body Bathing Maximal assistance  Upper Body Dressing  Set up  Lower Body Dressing Total assistance  Toilet Transfer Moderate assistance;BSC;RW  Toileting- Clothing Manipulation and Hygiene Moderate assistance  Tub/ Shower Transfer Moderate assistance  Functional mobility during ADLs Cueing for sequencing;Rolling walker;Cueing for safety;Moderate assistance  Vision- History  Baseline Vision/History Glaucoma  Bed Mobility  Overal bed mobility Needs Assistance  Bed Mobility Supine to Sit  Supine to sit Mod assist (with trunk managament)  Transfers  Equipment used Rolling  walker (2 wheeled)  Transfers Sit to/from Stand  Sit to Stand Mod assist  General transfer comment  lifting assist  Balance  Sitting-balance support Feet supported;Bilateral upper extremity supported  Sitting balance-Leahy Scale Poor  Sitting balance - Comments 1 LOB episode in sitting EOB   Standing balance support Bilateral upper extremity supported  Standing balance-Leahy Scale Poor  General Comments  General comments (skin integrity, edema, etc.) bruising to L hip area  OT - End of Session  Equipment Utilized During Treatment Gait belt;Rolling walker  Activity Tolerance Patient limited by fatigue  Patient left in chair;with call bell/phone within reach;with chair alarm set  Nurse Communication Mobility status  OT Assessment  OT Recommendation/Assessment Patient needs continued OT Services  OT Visit Diagnosis Unsteadiness on feet (R26.81);Muscle weakness (generalized) (M62.81)  OT Problem List Decreased strength;Impaired balance (sitting and/or standing);Decreased activity tolerance;Decreased cognition;Decreased safety awareness;Decreased knowledge of use of DME or AE  OT Plan  OT Frequency (ACUTE ONLY) Min 2X/week  OT Treatment/Interventions (ACUTE ONLY) Self-care/ADL training;Therapeutic exercise;Therapeutic activities;Patient/family education;Balance training;Cognitive remediation/compensation;DME and/or AE instruction;Energy conservation  AM-PAC OT "6 Clicks" Daily Activity Outcome Measure (Version 2)  Help from another person eating meals? 3  Help from another person taking care of personal grooming? 3  Help from another person toileting, which includes using toliet, bedpan, or urinal? 1  Help from another person bathing (including washing, rinsing, drying)? 2  Help from another person to put on and taking off regular upper body clothing? 3  Help from another person to put on and taking off regular lower body clothing? 1  6 Click Score 13  OT Recommendation  Follow Up  Recommendations Home health OT;Supervision/Assistance - 24 hour;SNF (versus SNF if family is not able to provide 24 hour assist)  Individuals Consulted  Consulted and Agree with Results and Recommendations Patient  Acute Rehab OT Goals  Patient Stated Goal to go home   OT Goal Formulation With patient  Time For Goal Achievement 07/30/19  Potential to Achieve Goals Good  OT Time Calculation  OT Start Time (ACUTE ONLY) 1027  OT Stop Time (ACUTE ONLY) 1109  OT Time Calculation (min) 42 min  OT General Charges  $OT Visit 1 Visit  OT Evaluation  $OT Eval Moderate Complexity 1 Mod  OT Treatments  $Self Care/Home Management  23-37 mins  Written Expression  Dominant Hand Right  Abbigail Anstey OTR/L

## 2019-07-16 NOTE — Evaluation (Signed)
Physical Therapy Evaluation Patient Details Name: [name] MRN: [id number] DOB: 01-11-1932 Today's Date: 07/16/2019   History of Present Illness  86 y.o. female  past medical history significant for CAD status post bypass, dementia, glaucoma, hypertension who presents with persistent left hip pain and inability to walk. Dx L hip hematoma.  Clinical Impression  Pt admitted with above diagnosis. Pt is oriented to self and location, not to the year able to follow commands, decreased awareness of deficits. Pt is poor historian. +2 mod assist for bed mobility and transfers. HEavy posterior lean in unsupported sitting. Unsteady gait requiring min/mod A. High falls risk. 24* assist recommended.  Will need SNF if family unable to provide 6* care. Pt currently with functional limitations due to the deficits listed below (see PT Problem List). Pt will benefit from skilled PT to increase their independence and safety with mobility to allow discharge to the venue listed below.       Follow Up Recommendations SNF;Supervision/Assistance - 24 hour;Supervision for mobility/OOB    Equipment Recommendations  Rolling walker with 5" wheels;Wheelchair cushion (measurements PT);Wheelchair (measurements PT)    Recommendations for Other Services       Precautions / Restrictions Precautions Precautions: Fall Precaution Comments: admitted with fall Restrictions Weight Bearing Restrictions: No      Mobility  Bed Mobility Overal bed mobility: Needs Assistance Bed Mobility: Supine to Sit     Supine to sit: +2 for physical assistance;Max assist     General bed mobility comments: assist to raise trunk and pivot hips to EOB, heavy posterior lean in sitting  Transfers Overall transfer level: Needs assistance Equipment used: Rolling walker (2 wheeled) Transfers: Sit to/from Stand Sit to Stand: Mod assist;+2 safety/equipment         General transfer comment: assist to rise and steady, VCs hand  placement  Ambulation/Gait Ambulation/Gait assistance: Mod assist;Min assist;+2 safety/equipment Gait Distance (Feet): 40 Feet Assistive device: Rolling walker (2 wheeled) Gait Pattern/deviations: Step-through pattern;Decreased stride length Gait velocity: decr   General Gait Details: VCs for positioning in RW, min/mod A for balance, decr safety awareness, difficulty motor planning  Stairs            Wheelchair Mobility    Modified Rankin (Stroke Patients Only)       Balance Overall balance assessment: Needs assistance Sitting-balance support: Bilateral upper extremity supported;Feet supported Sitting balance-Leahy Scale: Poor Sitting balance - Comments: posterior lean heavily when BUEs not supported   Standing balance support: Bilateral upper extremity supported Standing balance-Leahy Scale: Poor Standing balance comment: relies on BUE support                             Pertinent Vitals/Pain Pain Assessment: No/denies pain    Home Living Family/patient expects to be discharged to:: Private residence Living Arrangements: Other relatives(Granddaughter) Available Help at Discharge: Available PRN/intermittently Type of Home: House Home Access: (P) Level entry     Home Layout: (P) Two level;Able to live on main level with bedroom/bathroom Home Equipment: Shower seat;Grab bars - tub/shower Additional Comments: lives with granddaughter who is a Pharmacist, hospital, pt is poor historian, unclear if she has stairs in home as she gave conflicting answers    Prior Function Level of Independence: Independent               Hand Dominance        Extremity/Trunk Assessment   Upper Extremity Assessment Upper Extremity Assessment: Defer to OT evaluation  Lower Extremity Assessment Lower Extremity Assessment: Overall WFL for tasks assessed    Cervical / Trunk Assessment Cervical / Trunk Assessment: Normal  Communication   Communication: HOH  Cognition  Arousal/Alertness: Awake/alert Behavior During Therapy: WFL for tasks assessed/performed Overall Cognitive Status: No family/caregiver present to determine baseline cognitive functioning                                 General Comments: oriented to self and location, not to year, can follow 1 step commands, decreased awareness of deficits/safety      General Comments      Exercises     Assessment/Plan    PT Assessment Patient needs continued PT services  PT Problem List Decreased mobility;Decreased balance;Decreased cognition;Decreased knowledge of use of DME       PT Treatment Interventions Gait training;Therapeutic activities;Functional mobility training;Therapeutic exercise;Balance training;Patient/family education;Cognitive remediation    PT Goals (Current goals can be found in the Care Plan section)  Acute Rehab PT Goals Patient Stated Goal: none stated PT Goal Formulation: Patient unable to participate in goal setting Time For Goal Achievement: 07/30/19 Potential to Achieve Goals: Fair    Frequency Min 3X/week   Barriers to discharge        Co-evaluation PT/OT/SLP Co-Evaluation/Treatment: Yes Reason for Co-Treatment: Complexity of the patient's impairments (multi-system involvement);Necessary to address cognition/behavior during functional activity;For patient/therapist safety;To address functional/ADL transfers PT goals addressed during session: Mobility/safety with mobility;Balance;Proper use of DME OT goals addressed during session: (P) ADL's and self-care       AM-PAC PT "6 Clicks" Mobility  Outcome Measure Help needed turning from your back to your side while in a flat bed without using bedrails?: A Lot Help needed moving from lying on your back to sitting on the side of a flat bed without using bedrails?: A Lot Help needed moving to and from a bed to a chair (including a wheelchair)?: A Lot Help needed standing up from a chair using your arms  (e.g., wheelchair or bedside chair)?: A Lot Help needed to walk in hospital room?: A Lot Help needed climbing 3-5 steps with a railing? : Total 6 Click Score: 11    End of Session Equipment Utilized During Treatment: Gait belt Activity Tolerance: Patient tolerated treatment well Patient left: in chair;with call bell/phone within reach;with chair alarm set;with nursing/sitter in room Nurse Communication: Mobility status PT Visit Diagnosis: Unsteadiness on feet (R26.81);Difficulty in walking, not elsewhere classified (R26.2);History of falling (Z91.81)    Time: [id number] PT Time Calculation (min) (ACUTE ONLY): 28 min   Charges:   PT Evaluation $PT Eval Moderate Complexity: 1 Mod         Philomena Doheny PT 07/16/2019  Acute Rehabilitation Services Pager [phone] Office [phone]

## 2019-07-17 LAB — CBC WITH DIFFERENTIAL/PLATELET
Abs Immature Granulocytes: 0.01 10*3/uL (ref 0.00–0.07)
Basophils Absolute: 0 10*3/uL (ref 0.0–0.1)
Basophils Relative: 1 %
Eosinophils Absolute: 0.1 10*3/uL (ref 0.0–0.5)
Eosinophils Relative: 2 %
HCT: 41.3 % (ref 36.0–46.0)
Hemoglobin: 13.7 g/dL (ref 12.0–15.0)
Immature Granulocytes: 0 %
Lymphocytes Relative: 38 %
Lymphs Abs: 1.7 10*3/uL (ref 0.7–4.0)
MCH: 31.7 pg (ref 26.0–34.0)
MCHC: 33.2 g/dL (ref 30.0–36.0)
MCV: 95.6 fL (ref 80.0–100.0)
Monocytes Absolute: 0.4 10*3/uL (ref 0.1–1.0)
Monocytes Relative: 9 %
Neutro Abs: 2.1 10*3/uL (ref 1.7–7.7)
Neutrophils Relative %: 50 %
Platelets: 230 10*3/uL (ref 150–400)
RBC: 4.32 MIL/uL (ref 3.87–5.11)
RDW: 12.7 % (ref 11.5–15.5)
WBC: 4.3 10*3/uL (ref 4.0–10.5)
nRBC: 0 % (ref 0.0–0.2)

## 2019-07-17 LAB — BASIC METABOLIC PANEL
Anion gap: 7 (ref 5–15)
BUN: 14 mg/dL (ref 8–23)
CO2: 25 mmol/L (ref 22–32)
Calcium: 9.2 mg/dL (ref 8.9–10.3)
Chloride: 108 mmol/L (ref 98–111)
Creatinine, Ser: 0.73 mg/dL (ref 0.44–1.00)
GFR calc Af Amer: >60 mL/min (ref >60)
GFR calc non Af Amer: >60 mL/min (ref >60)
Glucose, Bld: 95 mg/dL (ref 70–99)
Potassium: 3.8 mmol/L (ref 3.5–5.1)
Sodium: 140 mmol/L (ref 135–145)

## 2019-07-17 NOTE — TOC Progression Note (Signed)
Transition of Care [facility]) - Progression Note    Patient Details  Name: [name] MRN: [id number] Date of Birth: 05/07/1931  Transition of Care [facility]) CM/SW [place], [place] Phone Number: 07/17/2019, 3:49 PM  Clinical Narrative:   Patient accepted at [facility], among others.  Consulted with granddaughter who confirmed [facility] choice.  Patient has had both vaccinations as of 2 weeks ago, but [facility] still requires COVID test.  Dr Kennon Holter with request. TOC will continue to follow during the course of hospitalization.     Expected Discharge Plan: Skilled Nursing Facility Barriers to Discharge: Barriers Resolved  Expected Discharge Plan and Services Expected Discharge Plan: [place]   Discharge Planning Services: CM Consult   Living arrangements for the past 2 months: Single Family Home                                       Social Determinants of Health (SDOH) Interventions    Readmission Risk Interventions No flowsheet data found.

## 2019-07-17 NOTE — Progress Notes (Signed)
PROGRESS NOTE  [name] [id number] DOB: [birth year] DOA: 07/15/2019 PCP: Carol Ada, MD  HPI/Recap of past 24 hours: HPI from [name] is a 86 y.o. female with past medical history significant for CAD status post bypass, glaucoma, hypertension who presents with persistent left hip pain and inability to walk. Of note patient was evaluated at [facility] day prior to this admission after she tripped and fell.  She was walking and tripped into a dog crate. She  fell backward in the garage.  She reports pain in her left leg, described as painful and heavy.  After she was discharged from the ED, pt was not able to get out of the car according to the daughter. Pt was noted to be confused and unable to walk. In the ED, labs showed electrolytes normal other than low potassium at 3.3, AST 64, troponin 25, UA negative.  CT abdomen and pelvis: Cholelithiasis. Soft tissue swelling/hematoma adjacent to left hip and lateral thigh.  Patient admitted for further management.     Today, patient denies any new complaints, denies any worsening left hip pain, denies any chest pain, abdominal pain, shortness of breath, nausea/vomiting, fever/chills.    Assessment/Plan: Active Problems:   CAD (coronary artery disease), autologous vein bypass graft   Essential hypertension   Hyperlipidemia LDL goal <70   Hypokalemia   Hip pain  Left hip hematoma Likely 2/2 from recent fall, no fractures noted on x-ray hip/CT A/P CT head with no acute changes Continue pain management PT/OT requiring SNF  Hypertension Continue lisinopril, atenolol, as needed hydralazine  CAD Currently chest pain-free Continue Plavix, aspirin as hemoglobin has remained stable  Glaucoma Continue with eyedrops  Dementia Pleasantly confused Continue with Aricept            Malnutrition Type:      Malnutrition Characteristics:      Nutrition Interventions:        Estimated body mass index is 20.18 kg/m as calculated from the following:   Height as of this encounter: 5\' 5"  (1.651 m).   Weight as of this encounter: 55 kg.     Code Status: Full  Family Communication: Attempted to reach grand-daughter on 07/16/19, no answer   Disposition Plan: Patient coming from home. PT recommending SNF   Consultants:  None  Procedures:  None  Antimicrobials:  None  DVT prophylaxis: SCD   Objective: Vitals:   07/16/19 2104 07/16/19 2326 07/17/19 0525 07/17/19 1300  BP: (!) 122/57  123/81 (!) (P) 164/122  Pulse: 63 65 62 (!) (P) 55  Resp: 15  16   Temp: 98 F (36.7 C)  98.1 F (36.7 C) (!) (P) 97.4 F (36.3 C)  TempSrc: Oral  Oral (P) Oral  SpO2: 96%  100% (P) 90%  Weight:      Height:        Intake/Output Summary (Last 24 hours) at 07/17/2019 1502 Last data filed at 07/17/2019 [id number] Gross per 24 hour  Intake --  Output 400 ml  Net -400 ml   Filed Weights   07/15/19 1159  Weight: 55 kg    Exam:  General: NAD, pleasantly confused  Cardiovascular: S1, S2 present  Respiratory: CTAB  Abdomen: Soft, nontender, nondistended, bowel sounds present  Musculoskeletal: No bilateral pedal edema noted, hematoma/bruising noted on left lower leg  Skin:  As noted above  Psychiatry:  Unable to assess, appears at baseline   Data Reviewed: CBC: Recent Labs  Lab 07/14/19 0904  07/15/19 1237 07/16/19 0535 07/17/19 0551  WBC 8.9 6.2 5.4 4.3  NEUTROABS 7.1 3.7  --  2.1  HGB 12.8 12.7 12.5 13.7  HCT 39.1 39.1 39.5 41.3  MCV 94.9 96.8 98.8 95.6  PLT 201 209 203 [id number]   Basic Metabolic Panel: Recent Labs  Lab 07/14/19 0904 07/15/19 1237 07/16/19 0535 07/17/19 0551  NA 143 145 140 140  K 3.5 3.3* 3.8 3.8  CL 108 108 109 108  CO2 24 26 23 25   GLUCOSE 115* 110* 86 95  BUN 18 18 15 14   CREATININE 1.00 0.91 0.81 0.73  CALCIUM 9.4 9.2 8.9 9.2   GFR: Estimated Creatinine Clearance: 43.8 mL/min (by C-G formula based on SCr of  0.73 mg/dL). Liver Function Tests: Recent Labs  Lab 07/14/19 0904 07/15/19 1237  AST 15 64*  ALT 9 25  ALKPHOS 74 76  BILITOT 0.7 1.0  PROT 6.0* 6.3*  ALBUMIN 3.6 3.9   Recent Labs  Lab 07/15/19 1237  LIPASE 15   No results for input(s): AMMONIA in the last 168 hours. Coagulation Profile: No results for input(s): INR, PROTIME in the last 168 hours. Cardiac Enzymes: Recent Labs  Lab 07/14/19 0904  CKTOTAL 192   BNP (last 3 results) No results for input(s): PROBNP in the last 8760 hours. HbA1C: No results for input(s): HGBA1C in the last 72 hours. CBG: No results for input(s): GLUCAP in the last 168 hours. Lipid Profile: No results for input(s): CHOL, HDL, LDLCALC, TRIG, CHOLHDL, LDLDIRECT in the last 72 hours. Thyroid Function Tests: No results for input(s): TSH, T4TOTAL, FREET4, T3FREE, THYROIDAB in the last 72 hours. Anemia Panel: No results for input(s): VITAMINB12, FOLATE, FERRITIN, TIBC, IRON, RETICCTPCT in the last 72 hours. Urine analysis:    Component Value Date/Time   COLORURINE YELLOW 07/15/2019 1504   APPEARANCEUR CLEAR 07/15/2019 1504   APPEARANCEUR Clear 01/27/2016 1115   LABSPEC 1.033 (H) 07/15/2019 1504   PHURINE 5.0 07/15/2019 1504   GLUCOSEU NEGATIVE 07/15/2019 1504   HGBUR NEGATIVE 07/15/2019 1504   BILIRUBINUR NEGATIVE 07/15/2019 1504   BILIRUBINUR Negative 01/27/2016 1115   KETONESUR NEGATIVE 07/15/2019 1504   PROTEINUR NEGATIVE 07/15/2019 1504   NITRITE NEGATIVE 07/15/2019 1504   LEUKOCYTESUR NEGATIVE 07/15/2019 1504   Sepsis Labs: @LABRCNTIP (procalcitonin:4,lacticidven:4)  ) Recent Results (from the past 240 hour(s))  SARS CORONAVIRUS 2 (TAT 6-24 HRS) Nasopharyngeal Nasopharyngeal Swab     Status: None   Collection Time: 07/15/19  5:24 PM   Specimen: Nasopharyngeal Swab  Result Value Ref Range Status   SARS Coronavirus 2 NEGATIVE NEGATIVE Final    Comment: (NOTE) SARS-CoV-2 target nucleic acids are NOT DETECTED. The SARS-CoV-2  RNA is generally detectable in upper and lower respiratory specimens during the acute phase of infection. Negative results do not preclude SARS-CoV-2 infection, do not rule out co-infections with other pathogens, and should not be used as the sole basis for treatment or other patient management decisions. Negative results must be combined with clinical observations, patient history, and epidemiological information. The expected result is Negative. Fact Sheet for Patients: SugarRoll.be Fact Sheet for Healthcare Providers: https://www.woods-mathews.com/ This test is not yet approved or cleared by the [place] FDA and  has been authorized for detection and/or diagnosis of SARS-CoV-2 by FDA under an Emergency Use Authorization (EUA). This EUA will remain  in effect (meaning this test can be used) for the duration of the COVID-19 declaration under Section 56 4(b)(1) of the Act, 21 U.S.C. section 360bbb-3(b)(1), unless the authorization is  terminated or revoked sooner. Performed at [facility], [place] [address], [place], [place] [address]       Studies: No results found.  Scheduled Meds: . acetaminophen  500 mg Oral TID  . aspirin EC  81 mg Oral QHS  . atenolol  25 mg Oral QHS  . brimonidine  1 drop Left Eye BID  . cholecalciferol  2,000 Units Oral QHS  . clopidogrel  75 mg Oral Daily  . donepezil  5 mg Oral QHS  . dorzolamide-timolol  1 drop Left Eye BID  . latanoprost  1 drop Both Eyes QHS  . lisinopril  20 mg Oral QHS  . senna  1 tablet Oral BID    Continuous Infusions:    LOS: 1 day     Alma Friendly, MD [facility]  If 7PM-7AM, please contact night-coverage [email or web address] 07/17/2019, 3:02 PM

## 2019-07-17 NOTE — Progress Notes (Signed)
BP 164/122 PRN hydralazine given. Will continue to monitor.

## 2019-07-18 DIAGNOSIS — R05 Cough: Secondary | ICD-10-CM | POA: Diagnosis not present

## 2019-07-18 DIAGNOSIS — S7002XA Contusion of left hip, initial encounter: Secondary | ICD-10-CM | POA: Diagnosis not present

## 2019-07-18 DIAGNOSIS — Z743 Need for continuous supervision: Secondary | ICD-10-CM | POA: Diagnosis not present

## 2019-07-18 DIAGNOSIS — I25810 Atherosclerosis of coronary artery bypass graft(s) without angina pectoris: Secondary | ICD-10-CM | POA: Diagnosis not present

## 2019-07-18 DIAGNOSIS — R0902 Hypoxemia: Secondary | ICD-10-CM | POA: Diagnosis not present

## 2019-07-18 DIAGNOSIS — E538 Deficiency of other specified B group vitamins: Secondary | ICD-10-CM | POA: Diagnosis not present

## 2019-07-18 DIAGNOSIS — E785 Hyperlipidemia, unspecified: Secondary | ICD-10-CM | POA: Diagnosis not present

## 2019-07-18 DIAGNOSIS — I700 Atherosclerosis of aorta: Secondary | ICD-10-CM | POA: Diagnosis not present

## 2019-07-18 DIAGNOSIS — R279 Unspecified lack of coordination: Secondary | ICD-10-CM | POA: Diagnosis not present

## 2019-07-18 DIAGNOSIS — R278 Other lack of coordination: Secondary | ICD-10-CM | POA: Diagnosis not present

## 2019-07-18 DIAGNOSIS — E876 Hypokalemia: Secondary | ICD-10-CM | POA: Diagnosis not present

## 2019-07-18 DIAGNOSIS — R41841 Cognitive communication deficit: Secondary | ICD-10-CM | POA: Diagnosis not present

## 2019-07-18 DIAGNOSIS — I10 Essential (primary) hypertension: Secondary | ICD-10-CM | POA: Diagnosis not present

## 2019-07-18 DIAGNOSIS — H401130 Primary open-angle glaucoma, bilateral, stage unspecified: Secondary | ICD-10-CM | POA: Diagnosis not present

## 2019-07-18 DIAGNOSIS — F0390 Unspecified dementia without behavioral disturbance: Secondary | ICD-10-CM | POA: Diagnosis not present

## 2019-07-18 DIAGNOSIS — M6281 Muscle weakness (generalized): Secondary | ICD-10-CM | POA: Diagnosis not present

## 2019-07-18 DIAGNOSIS — I6930 Unspecified sequelae of cerebral infarction: Secondary | ICD-10-CM | POA: Diagnosis not present

## 2019-07-18 DIAGNOSIS — I2510 Atherosclerotic heart disease of native coronary artery without angina pectoris: Secondary | ICD-10-CM | POA: Diagnosis not present

## 2019-07-18 DIAGNOSIS — R52 Pain, unspecified: Secondary | ICD-10-CM | POA: Diagnosis not present

## 2019-07-18 DIAGNOSIS — R2681 Unsteadiness on feet: Secondary | ICD-10-CM | POA: Diagnosis not present

## 2019-07-18 DIAGNOSIS — M25559 Pain in unspecified hip: Secondary | ICD-10-CM | POA: Diagnosis not present

## 2019-07-18 DIAGNOSIS — M25552 Pain in left hip: Secondary | ICD-10-CM | POA: Diagnosis not present

## 2019-07-18 DIAGNOSIS — K8080 Other cholelithiasis without obstruction: Secondary | ICD-10-CM | POA: Diagnosis not present

## 2019-07-18 DIAGNOSIS — S7002XD Contusion of left hip, subsequent encounter: Secondary | ICD-10-CM | POA: Diagnosis not present

## 2019-07-18 DIAGNOSIS — W19XXXD Unspecified fall, subsequent encounter: Secondary | ICD-10-CM | POA: Diagnosis not present

## 2019-07-18 LAB — CBC WITH DIFFERENTIAL/PLATELET
Abs Immature Granulocytes: 0.02 10*3/uL (ref 0.00–0.07)
Basophils Absolute: 0 10*3/uL (ref 0.0–0.1)
Basophils Relative: 1 %
Eosinophils Absolute: 0.1 10*3/uL (ref 0.0–0.5)
Eosinophils Relative: 2 %
HCT: 41.6 % (ref 36.0–46.0)
Hemoglobin: 14 g/dL (ref 12.0–15.0)
Immature Granulocytes: 0 %
Lymphocytes Relative: 29 %
Lymphs Abs: 1.6 10*3/uL (ref 0.7–4.0)
MCH: 31.2 pg (ref 26.0–34.0)
MCHC: 33.7 g/dL (ref 30.0–36.0)
MCV: 92.7 fL (ref 80.0–100.0)
Monocytes Absolute: 0.6 10*3/uL (ref 0.1–1.0)
Monocytes Relative: 10 %
Neutro Abs: 3.3 10*3/uL (ref 1.7–7.7)
Neutrophils Relative %: 58 %
Platelets: 222 10*3/uL (ref 150–400)
RBC: 4.49 MIL/uL (ref 3.87–5.11)
RDW: 12.7 % (ref 11.5–15.5)
WBC: 5.7 10*3/uL (ref 4.0–10.5)
nRBC: 0 % (ref 0.0–0.2)

## 2019-07-18 LAB — SARS CORONAVIRUS 2 (TAT 6-24 HRS): SARS Coronavirus 2: NEGATIVE

## 2019-07-18 NOTE — Plan of Care (Signed)

## 2019-07-18 NOTE — Progress Notes (Signed)
Physical Therapy Treatment Patient Details Name: [name] MRN: [id number] DOB: [birth year] Today's Date: 07/18/2019    History of Present Illness 86 y.o. female  past medical history significant for CAD status post bypass, dementia, glaucoma, hypertension who presents with persistent left hip pain and inability to walk. Dx L hip hematoma.    PT Comments    Pt ambulated 70' with RW with min/mod assist to maneuver RW.  Mod assist for bed mobility and transfers. Improved mobility today. Pt continues to be pleasantly confused.   Follow Up Recommendations  SNF;Supervision/Assistance - 24 hour;Supervision for mobility/OOB     Equipment Recommendations  Rolling walker with 5" wheels;Wheelchair cushion (measurements PT);Wheelchair (measurements PT)    Recommendations for Other Services       Precautions / Restrictions Precautions Precautions: Fall Precaution Comments: admitted with fall Restrictions Weight Bearing Restrictions: No    Mobility  Bed Mobility Overal bed mobility: Needs Assistance Bed Mobility: Supine to Sit     Supine to sit: Mod assist     General bed mobility comments: assist to raise trunk and pivot hips to EOB  Transfers Overall transfer level: Needs assistance Equipment used: Rolling walker (2 wheeled) Transfers: Sit to/from Stand Sit to Stand: Mod assist         General transfer comment: assist to rise and steady, VCs and manual assist for hand placement  Ambulation/Gait Ambulation/Gait assistance: Min assist Gait Distance (Feet): 50 Feet Assistive device: Rolling walker (2 wheeled) Gait Pattern/deviations: Step-through pattern;Decreased stride length;Shuffle Gait velocity: decr   General Gait Details: VCs for positioning in RW, min/mod A to manuever RW with turns, decr safety awareness, difficulty motor planning 2* confusion   Stairs             Wheelchair Mobility    Modified Rankin (Stroke Patients Only)       Balance  Overall balance assessment: Needs assistance Sitting-balance support: Bilateral upper extremity supported;Feet supported Sitting balance-Leahy Scale: Fair     Standing balance support: Bilateral upper extremity supported Standing balance-Leahy Scale: Fair Standing balance comment: relies on BUE support                            Cognition Arousal/Alertness: Awake/alert Behavior During Therapy: WFL for tasks assessed/performed Overall Cognitive Status: No family/caregiver present to determine baseline cognitive functioning                                 General Comments: oriented to self and location, not to year, can follow 1 step commands, decreased awareness of deficits/safety      Exercises      General Comments        Pertinent Vitals/Pain Pain Assessment: Faces Faces Pain Scale: No hurt    Home Living                      Prior Function            PT Goals (current goals can now be found in the care plan section) Acute Rehab PT Goals Patient Stated Goal: none stated PT Goal Formulation: Patient unable to participate in goal setting Time For Goal Achievement: 07/30/19 Potential to Achieve Goals: Fair Progress towards PT goals: Progressing toward goals    Frequency    Min 3X/week      PT Plan Current plan remains appropriate    Co-evaluation PT/OT/SLP  Co-Evaluation/Treatment: Yes            AM-PAC PT "6 Clicks" Mobility   Outcome Measure  Help needed turning from your back to your side while in a flat bed without using bedrails?: A Lot Help needed moving from lying on your back to sitting on the side of a flat bed without using bedrails?: A Lot Help needed moving to and from a bed to a chair (including a wheelchair)?: A Lot Help needed standing up from a chair using your arms (e.g., wheelchair or bedside chair)?: A Lot Help needed to walk in hospital room?: A Little Help needed climbing 3-5 steps with a  railing? : Total 6 Click Score: 12    End of Session Equipment Utilized During Treatment: Gait belt Activity Tolerance: Patient tolerated treatment well Patient left: in chair;with call bell/phone within reach;with chair alarm set Nurse Communication: Mobility status PT Visit Diagnosis: Unsteadiness on feet (R26.81);Difficulty in walking, not elsewhere classified (R26.2);History of falling (Z91.81)     Time: [id number] PT Time Calculation (min) (ACUTE ONLY): 19 min  Charges:  $Gait Training: 8-22 mins                     Blondell Reveal Kistler PT 07/18/2019  Acute Rehabilitation Services Pager [phone] Office [phone]

## 2019-07-18 NOTE — Discharge Summary (Signed)
Discharge Summary  [name] [id number] DOB: 1932-01-01  PCP: Carol Ada, MD  Admit date: 07/15/2019 Discharge date: 07/18/2019  Time spent: 35 mins  Recommendations for Outpatient Follow-up:  1. PCP in 1 week  Discharge Diagnoses:  Active Hospital Problems   Diagnosis Date Noted  . Hip pain 07/15/2019  . Hypokalemia 10/10/2016  . Hyperlipidemia LDL goal <70 05/14/2013  . Essential hypertension   . CAD (coronary artery disease), autologous vein bypass graft 06/30/2006    Resolved Hospital Problems  No resolved problems to display.    Discharge Condition: Stable  Diet recommendation: As tolerated   Vitals:   07/17/19 2040 07/18/19 0455  BP: 107/66 (!) 150/72  Pulse: 68 (!) 59  Resp: 20 18  Temp: 98.5 F (36.9 C) (!) 97.5 F (36.4 C)  SpO2: 98% 98%    History of present illness:  [name] a 86 y.o.female with past medical history significant for CAD status post bypass, glaucoma, hypertension who presents with persistent left hip pain and inability to walk. Of note patient was evaluated at [facility] prior to this admission after she tripped and fell. She was walking and tripped into a dog crate. Shefell backward in the garage. She reports pain in her left leg, described as painful and heavy. After she was discharged from the ED, pt was not able to get out of the car according to the daughter. Pt was noted to be confused and unable to walk. In the ED, labs showed electrolytes normal other than low potassium at 3.3, AST 64, troponin25, UA negative. CT abdomen and pelvis: Cholelithiasis. Soft tissue swelling/hematoma adjacent to left hip and lateral thigh.  Patient admitted for further management.    Today, pt denies any new complaints. Pt stable to d/c to SNF for further rehab needs     Hospital Course:  Active Problems:   CAD (coronary artery disease), autologous vein bypass graft   Essential hypertension  Hyperlipidemia LDL goal <70   Hypokalemia   Hip pain  Left hip hematoma Likely 2/2 from recent fall, no fractures noted on x-ray hip/CT A/P CT head with no acute changes PT/OT recommending SNF  Hypertension Continue lisinopril, atenolol  CAD Currently chest pain-free Continue Plavix, aspirin as hemoglobin has remained stable  Glaucoma Continue with eyedrops  Dementia Pleasantly confused Continue with Aricept        Malnutrition Type:      Malnutrition Characteristics:      Nutrition Interventions:      Estimated body mass index is 20.18 kg/m as calculated from the following:   Height as of this encounter: 5\' 5"  (1.651 m).   Weight as of this encounter: 55 kg.    Procedures:  None  Consultations:  None  Discharge Exam: BP (!) 150/72 (BP Location: Left Arm)   Pulse (!) 59   Temp (!) 97.5 F (36.4 C) (Oral)   Resp 18   Ht 5\' 5"  (1.651 m)   Wt 55 kg   SpO2 98%   BMI 20.18 kg/m   General: NAD Cardiovascular: S1, S2 present  Respiratory: CTAB  Discharge Instructions You were cared for by a hospitalist during your hospital stay. If you have any questions about your discharge medications or the care you received while you were in the hospital after you are discharged, you can call the unit and asked to speak with the hospitalist on call if the hospitalist that took care of you is not available. Once you are discharged, your  primary care physician will handle any further medical issues. Please note that NO REFILLS for any discharge medications will be authorized once you are discharged, as it is imperative that you return to your primary care physician (or establish a relationship with a primary care physician if you do not have one) for your aftercare needs so that they can reassess your need for medications and monitor your lab values.  Discharge Instructions    Diet - low sodium heart healthy   Complete by: As directed    Increase activity  slowly   Complete by: As directed      Allergies as of 07/18/2019      Reactions   Nsaids Other (See Comments)   URINARY RETENTION   Statins Other (See Comments)   MYALGIAS HURTS ALL OVER   Tricor [fenofibrate] Other (See Comments)   MYALGIAS HURTS ALL OVER   Zetia [ezetimibe] Other (See Comments)   MYALGIAS HURTS ALL OVER   Lescol [fluvastatin]    Muscle pain   Livalo [pitavastatin]    Muscle pain   Niaspan [niacin]    Severe flushing   Relafen [nabumetone] Rash      Medication List    TAKE these medications   acetaminophen 500 MG tablet Commonly known as: TYLENOL Take 500 mg by mouth every 4 (four) hours as needed (for pain).   Alphagan P 0.1 % Soln Generic drug: brimonidine Place 1 drop into the left eye in the morning and at bedtime.   aspirin EC 81 MG tablet Take 81 mg by mouth at bedtime.   atenolol 25 MG tablet Commonly known as: TENORMIN Take 1 tablet (25 mg total) daily by mouth. May take an additional 25 mg of medication daily, if SBP> 160 or DBP >100 What changed: when to take this   clopidogrel 75 MG tablet Commonly known as: PLAVIX Take 1 tablet (75 mg total) by mouth daily.   donepezil 5 MG tablet Commonly known as: Aricept Take 1 tablet (5 mg total) by mouth at bedtime.   dorzolamide-timolol 22.3-6.8 MG/ML ophthalmic solution Commonly known as: COSOPT Place 1 drop into the left eye 2 (two) times daily.   latanoprost 0.005 % ophthalmic solution Commonly known as: XALATAN Place 1 drop into both eyes at bedtime.   lisinopril 20 MG tablet Commonly known as: ZESTRIL Take 20 mg by mouth at bedtime.   Vitamin D3 50 MCG (2000 UT) capsule Take 1 capsule (2,000 Units total) by mouth at bedtime.      Allergies  Allergen Reactions  . Nsaids Other (See Comments)    URINARY RETENTION  . Statins Other (See Comments)    MYALGIAS HURTS ALL OVER  . Tricor [Fenofibrate] Other (See Comments)    MYALGIAS HURTS ALL OVER  . Zetia [Ezetimibe]  Other (See Comments)    MYALGIAS HURTS ALL OVER  . Lescol [Fluvastatin]     Muscle pain  . Livalo [Pitavastatin]     Muscle pain  . Niaspan [Niacin]     Severe flushing   . Relafen [Nabumetone] Rash    Contact information for follow-up providers    Carol Ada, MD. Schedule an appointment as soon as possible for a visit in 1 week(s).   Specialty: Family Medicine Contact information: [address], [address] [place] [address] [phone]            Contact information for after-discharge care    Destination    [facility] Preferred SNF .   Service: Skilled  Nursing Contact information: [place] [phone]                   The results of significant diagnostics from this hospitalization (including imaging, microbiology, ancillary and laboratory) are listed below for reference.    Significant Diagnostic Studies: DG Chest 1 View  Result Date: 07/14/2019 CLINICAL DATA:  Pain following fall EXAM: CHEST  1 VIEW COMPARISON:  August 07, 2017 FINDINGS: There is slight atelectasis in the medial left base. The lungs elsewhere are clear. Heart size and pulmonary vascularity are normal. No adenopathy. Patient is status post coronary artery bypass grafting. There is aortic atherosclerosis. No pneumothorax. There is a suspected nondisplaced fracture of the anterolateral right fifth rib. A loop recorder is present on the left inferiorly. IMPRESSION: Questionable fracture of the anterolateral right fifth rib. No pneumothorax. Mild left base atelectasis. Lungs elsewhere clear. Cardiac silhouette within normal limits and stable. Postoperative changes noted. Aortic Atherosclerosis (ICD10-I70.0). Electronically Signed   By: Lowella Grip III M.D.   On: 07/14/2019 08:49   DG Lumbar Spine Complete  Result Date: 07/14/2019 CLINICAL DATA:  Pain following fall EXAM: LUMBAR SPINE - COMPLETE 4+ VIEW COMPARISON:   None. FINDINGS: Frontal, lateral, spot lumbosacral lateral, and bilateral oblique views were obtained. There are 5 non-rib-bearing lumbar type vertebral bodies. No acute fracture is demonstrable. There is 8 mm of retrolisthesis of L2 on L3. There is 5 mm of anterolisthesis of L4 on L5. There is 4 mm of retrolisthesis of L1 on L2. There is moderately severe disc space narrowing at L4-5 and L5-S1. There is moderate disc space narrowing at L1-2 and L2-3. There are anterior osteophytes at all levels. There is facet osteoarthritic change at all levels bilaterally. There is aortic atherosclerosis. There are calcifications in the right upper quadrant. There is a fracture of the inferior-most sternal wire. IMPRESSION: No appreciable fracture. Spondylolisthesis at several levels, likely due to underlying spondylosis. Multiple foci disc space narrowing and diffuse bilateral facet osteoarthritic change throughout the lumbar region. Apparent cholelithiasis. Aortic Atherosclerosis (ICD10-I70.0). Electronically Signed   By: Lowella Grip III M.D.   On: 07/14/2019 08:47   CT Head Wo Contrast  Result Date: 07/14/2019 CLINICAL DATA:  Fall. Blunt head trauma. On anticoagulation. EXAM: CT HEAD WITHOUT CONTRAST TECHNIQUE: Contiguous axial images were obtained from the base of the skull through the vertex without intravenous contrast. COMPARISON:  08/07/2017 FINDINGS: Brain: No evidence of acute infarction, hemorrhage, hydrocephalus, extra-axial collection, or mass lesion/mass effect. Mild diffuse cerebral atrophy and moderate chronic small vessel disease is again seen. Old lacunar infarcts are noted in the right superior cerebellum and left centrum semiovale. Vascular:  No hyperdense vessel or other acute findings. Skull: No evidence of fracture or other significant bone abnormality. Sinuses/Orbits:  No acute findings. Other: None. IMPRESSION: 1. No acute intracranial abnormality. 2. Stable cerebral atrophy, chronic small  vessel disease, and old lacunar infarcts. Electronically Signed   By: Marlaine Hind M.D.   On: 07/14/2019 08:21   CT ABDOMEN PELVIS W CONTRAST  Result Date: 07/15/2019 CLINICAL DATA:  Abdominal pain EXAM: CT ABDOMEN AND PELVIS WITH CONTRAST TECHNIQUE: Multidetector CT imaging of the abdomen and pelvis was performed using the standard protocol following bolus administration of intravenous contrast. CONTRAST:  174mL OMNIPAQUE IOHEXOL 300 MG/ML  SOLN COMPARISON:  None. FINDINGS: Lower chest: Bibasilar atelectasis/scarring. Hepatobiliary: No focal liver lesion. Cholelithiasis. No ductal dilatation. Pancreas: Atrophic.  Otherwise unremarkable. Spleen: Unremarkable. Adrenals/Urinary Tract: Upper pole right  renal cyst. No hydronephrosis. Bladder is unremarkable. Stomach/Bowel: Stomach is within normal limits. Bowel is normal in caliber. Appendix is not visualized. Vascular/Lymphatic: Aortic atherosclerosis. No enlarged abdominal or pelvic lymph nodes. Reproductive: Uterus and bilateral adnexa are unremarkable. Other: No abdominal wall hernia or abnormality. No abdominopelvic ascites. Musculoskeletal: Soft tissue swelling/hematoma adjacent to the left greater trochanter and lateral thigh. Advanced degenerative changes of the lumbar spine. IMPRESSION: Cholelithiasis. Soft tissue swelling/hematoma adjacent to left hip and lateral thigh. Electronically Signed   By: Macy Mis M.D.   On: 07/15/2019 14:27   DG Hip Unilat W or Wo Pelvis 2-3 Views Left  Result Date: 07/14/2019 CLINICAL DATA:  Pain following fall EXAM: DG HIP (WITH OR WITHOUT PELVIS) 2-3V LEFT COMPARISON:  None. FINDINGS: Frontal pelvis as well as frontal and lateral left hip images were obtained. No acute fracture or dislocation. There is moderate symmetric narrowing of each hip joint. No erosive change. There is narrowing of the pubic symphysis. There is apparent postoperative change in the right greater trochanter region. IMPRESSION: Moderate  symmetric narrowing of each hip joint and pubic symphysis. No fracture or dislocation. Electronically Signed   By: Lowella Grip III M.D.   On: 07/14/2019 08:45    Microbiology: Recent Results (from the past 240 hour(s))  SARS CORONAVIRUS 2 (TAT 6-24 HRS) Nasopharyngeal Nasopharyngeal Swab     Status: None   Collection Time: 07/15/19  5:24 PM   Specimen: Nasopharyngeal Swab  Result Value Ref Range Status   SARS Coronavirus 2 NEGATIVE NEGATIVE Final    Comment: (NOTE) SARS-CoV-2 target nucleic acids are NOT DETECTED. The SARS-CoV-2 RNA is generally detectable in upper and lower respiratory specimens during the acute phase of infection. Negative results do not preclude SARS-CoV-2 infection, do not rule out co-infections with other pathogens, and should not be used as the sole basis for treatment or other patient management decisions. Negative results must be combined with clinical observations, patient history, and epidemiological information. The expected result is Negative. Fact Sheet for Patients: SugarRoll.be Fact Sheet for Healthcare Providers: https://www.woods-mathews.com/ This test is not yet approved or cleared by the [place] FDA and  has been authorized for detection and/or diagnosis of SARS-CoV-2 by FDA under an Emergency Use Authorization (EUA). This EUA will remain  in effect (meaning this test can be used) for the duration of the COVID-19 declaration under Section 56 4(b)(1) of the Act, 21 U.S.C. section 360bbb-3(b)(1), unless the authorization is terminated or revoked sooner. Performed at [facility], [place] [address] [place], [place] [address]   SARS CORONAVIRUS 2 (TAT 6-24 HRS) Nasopharyngeal Nasopharyngeal Swab     Status: None   Collection Time: 07/17/19  4:41 PM   Specimen: Nasopharyngeal Swab  Result Value Ref Range Status   SARS Coronavirus 2 NEGATIVE NEGATIVE Final    Comment: (NOTE) SARS-CoV-2 target  nucleic acids are NOT DETECTED. The SARS-CoV-2 RNA is generally detectable in upper and lower respiratory specimens during the acute phase of infection. Negative results do not preclude SARS-CoV-2 infection, do not rule out co-infections with other pathogens, and should not be used as the sole basis for treatment or other patient management decisions. Negative results must be combined with clinical observations, patient history, and epidemiological information. The expected result is Negative. Fact Sheet for Patients: SugarRoll.be Fact Sheet for Healthcare Providers: https://www.woods-mathews.com/ This test is not yet approved or cleared by the [place] FDA and  has been authorized for detection and/or diagnosis of SARS-CoV-2 by FDA under an  Emergency Use Authorization (EUA). This EUA will remain  in effect (meaning this test can be used) for the duration of the COVID-19 declaration under Section 56 4(b)(1) of the Act, 21 U.S.C. section 360bbb-3(b)(1), unless the authorization is terminated or revoked sooner. Performed at [facility], [place] [address] [place], [place] [address]      Labs: Basic Metabolic Panel: Recent Labs  Lab 07/14/19 0904 07/15/19 1237 07/16/19 0535 07/17/19 0551  NA 143 145 140 140  K 3.5 3.3* 3.8 3.8  CL 108 108 109 108  CO2 24 26 23 25   GLUCOSE 115* 110* 86 95  BUN 18 18 15 14   CREATININE 1.00 0.91 0.81 0.73  CALCIUM 9.4 9.2 8.9 9.2   Liver Function Tests: Recent Labs  Lab 07/14/19 0904 07/15/19 1237  AST 15 64*  ALT 9 25  ALKPHOS 74 76  BILITOT 0.7 1.0  PROT 6.0* 6.3*  ALBUMIN 3.6 3.9   Recent Labs  Lab 07/15/19 1237  LIPASE 15   No results for input(s): AMMONIA in the last 168 hours. CBC: Recent Labs  Lab 07/14/19 0904 07/15/19 1237 07/16/19 0535 07/17/19 0551 07/18/19 0536  WBC 8.9 6.2 5.4 4.3 5.7  NEUTROABS 7.1 3.7  --  2.1 3.3  HGB 12.8 12.7 12.5 13.7 14.0  HCT 39.1  39.1 39.5 41.3 41.6  MCV 94.9 96.8 98.8 95.6 92.7  PLT 201 209 203 230 222   Cardiac Enzymes: Recent Labs  Lab 07/14/19 0904  CKTOTAL 192   BNP: BNP (last 3 results) No results for input(s): BNP in the last 8760 hours.  ProBNP (last 3 results) No results for input(s): PROBNP in the last 8760 hours.  CBG: No results for input(s): GLUCAP in the last 168 hours.     Signed:  Alma Friendly, MD [facility] 07/18/2019, 11:38 AM

## 2019-07-18 NOTE — Progress Notes (Signed)
[name] to be D/C'd Home per MD order.  Discussed prescriptions and follow up appointments with the patient. Prescriptions given to patient, medication list explained in detail. Pt verbalized understanding.  Allergies as of 07/18/2019      Reactions   Nsaids Other (See Comments)   URINARY RETENTION   Statins Other (See Comments)   MYALGIAS HURTS ALL OVER   Tricor [fenofibrate] Other (See Comments)   MYALGIAS HURTS ALL OVER   Zetia [ezetimibe] Other (See Comments)   MYALGIAS HURTS ALL OVER   Lescol [fluvastatin]    Muscle pain   Livalo [pitavastatin]    Muscle pain   Niaspan [niacin]    Severe flushing   Relafen [nabumetone] Rash      Medication List    TAKE these medications   acetaminophen 500 MG tablet Commonly known as: TYLENOL Take 500 mg by mouth every 4 (four) hours as needed (for pain).   Alphagan P 0.1 % Soln Generic drug: brimonidine Place 1 drop into the left eye in the morning and at bedtime.   aspirin EC 81 MG tablet Take 81 mg by mouth at bedtime.   atenolol 25 MG tablet Commonly known as: TENORMIN Take 1 tablet (25 mg total) daily by mouth. May take an additional 25 mg of medication daily, if SBP> 160 or DBP >100 What changed: when to take this   clopidogrel 75 MG tablet Commonly known as: PLAVIX Take 1 tablet (75 mg total) by mouth daily.   donepezil 5 MG tablet Commonly known as: Aricept Take 1 tablet (5 mg total) by mouth at bedtime.   dorzolamide-timolol 22.3-6.8 MG/ML ophthalmic solution Commonly known as: COSOPT Place 1 drop into the left eye 2 (two) times daily.   latanoprost 0.005 % ophthalmic solution Commonly known as: XALATAN Place 1 drop into both eyes at bedtime.   lisinopril 20 MG tablet Commonly known as: ZESTRIL Take 20 mg by mouth at bedtime.   Vitamin D3 50 MCG (2000 UT) capsule Take 1 capsule (2,000 Units total) by mouth at bedtime.       Vitals:   07/17/19 2040 07/18/19 0455  BP: 107/66 (!) 150/72   Pulse: 68 (!) 59  Resp: 20 18  Temp: 98.5 F (36.9 C) (!) 97.5 F (36.4 C)  SpO2: 98% 98%    Skin clean, dry and intact without evidence of skin break down, no evidence of skin tears noted. IV catheter discontinued intact. Site without signs and symptoms of complications. Dressing and pressure applied. Pt denies pain at this time. No complaints noted. Cardiac monitor removed and returned to nursing station.   An After Visit Summary was printed and given to PTAR. Report called to the receiving RN at [facility]. RN denies having any questions or concerns, call back number given to RN. Patient transported to SNF via [facility].    Nonie Hoyer S 07/18/2019 1:03 PM

## 2019-07-18 NOTE — Care Management Important Message (Signed)
Important Message  Patient Details IM Letter given to Roque Lias SW Case Manager to present to the Patient Name: [name] MRN: [id number] Date of Birth: 1931/12/29   Medicare Important Message Given:  Yes     Kerin Salen 07/18/2019, 10:26 AM

## 2019-07-18 NOTE — TOC Transition Note (Signed)
Transition of Care [facility]) - CM/SW Discharge Note   Patient Details  Name: [name] MRN: [id number] Date of Birth: [birth year]  [facility] CM/SW Contact:  Trish Mage, LCSW Phone Number: 07/18/2019, 10:07 AM   Clinical Narrative:   Patient to transfer to [facility] today. PTAR arranged.  Nursing, please call report to [phone], room 202.  TOC sign off.    Final next level of care: Skilled Nursing Facility Barriers to Discharge: Barriers Resolved   Patient Goals and CMS Choice        Discharge Placement                       Discharge Plan and Services   Discharge Planning Services: CM Consult                                 Social Determinants of Health (SDOH) Interventions     Readmission Risk Interventions No flowsheet data found.

## 2019-07-20 DIAGNOSIS — E785 Hyperlipidemia, unspecified: Secondary | ICD-10-CM | POA: Diagnosis not present

## 2019-07-20 DIAGNOSIS — F0390 Unspecified dementia without behavioral disturbance: Secondary | ICD-10-CM | POA: Diagnosis not present

## 2019-07-20 DIAGNOSIS — I10 Essential (primary) hypertension: Secondary | ICD-10-CM | POA: Diagnosis not present

## 2019-07-20 DIAGNOSIS — S7002XA Contusion of left hip, initial encounter: Secondary | ICD-10-CM | POA: Diagnosis not present

## 2019-07-24 ENCOUNTER — Other Ambulatory Visit: Payer: Self-pay | Admitting: *Deleted

## 2019-07-24 NOTE — Patient Outreach (Signed)
Screened for potential [facility] Care Management needs as a benefit of  [employer].  [name] is currently receiving skilled therapy at [facility].  Writer attended telephonic interdisciplinary team meeting to assess for disposition needs and transition plan for resident.   Facility reports member has supportive granddaughter. However, [name] (granddaughter) works during the day as a Pharmacist, hospital. Facility reports they are recommending ALF placement. Member will need 24/7 upon SNF dc. She has very poor vision and decreased cognition. Psych consult was recommended. However, granddaughter declined psych consult per facility. Facility will have care plan meeting scheduled soon with granddaughter to discuss disposition plans and recommendations. .   Will continue to follow and plan outreach to granddaughter as appropriate.   Erica Rolling, MSN-Ed, RN,BSN [place] Acute Care Coordinator [phone] [facility]) [phone]  (Toll free office)

## 2019-08-01 ENCOUNTER — Telehealth: Payer: Self-pay | Admitting: Cardiology

## 2019-08-01 DIAGNOSIS — E785 Hyperlipidemia, unspecified: Secondary | ICD-10-CM | POA: Diagnosis not present

## 2019-08-01 DIAGNOSIS — S7002XA Contusion of left hip, initial encounter: Secondary | ICD-10-CM | POA: Diagnosis not present

## 2019-08-01 DIAGNOSIS — I10 Essential (primary) hypertension: Secondary | ICD-10-CM | POA: Diagnosis not present

## 2019-08-01 DIAGNOSIS — F0390 Unspecified dementia without behavioral disturbance: Secondary | ICD-10-CM | POA: Diagnosis not present

## 2019-08-01 NOTE — Telephone Encounter (Signed)
Pt c/o BP issue: STAT if pt c/o blurred vision, one-sided weakness or slurred speech  1. What are your last 5 BP readings? Not sure her grand daughter calling for her patient in a reb center. Patient will be release today then going to me care  2. Are you having any other symptoms (ex. Dizziness, headache, blurred vision, passed out)? Yesterday it dropped low and the patient was also throwing up.   3. What is your BP issue? Patient was taking off Lisinopril and they would like to know what the doctor thinks. Please patient granddaughter who is all so POA

## 2019-08-01 NOTE — Telephone Encounter (Signed)
Returned call to [employer], [facility]) she states tha pt's BP was low and she was vomiting. The nursing home held her Lisinopril. She was concerned because pt is being transferred to [facility] and is a significantly low level of care and she was wondering if there will be a medication lis when she gets transferred to the new facility. Informed [name] that "usually" when pt's get transferred that a medication list with directions for all her medications to be given. Verbalizes understanding. She will call and see if there is anything she needs to do.   Called [facility] s/w nurse Mia to get more information about what her BP was and if she will continue to hold lisinopril and what the current directions are. Mia stated that she is unable to give [place] any information over the phone and she states that any/all directions will be given with d/c-transfer to a new facility. She will not give me pt's BP to add to her chart in Epic.  Returned call to daughter informed her of information above. Verbalized understanding. She will CB with BP and medication information.

## 2019-08-05 ENCOUNTER — Other Ambulatory Visit: Payer: Self-pay | Admitting: *Deleted

## 2019-08-05 NOTE — Progress Notes (Signed)
Cardiology Clinic Note   Patient Name: [name] Date of Encounter: 08/06/2019  Primary Care Provider:  Carol Ada, MD Primary Cardiologist:  Glenetta Hew, MD  Patient Profile    [name] 86 year old female presents today for follow-up of her hypertension.  On 08/01/2019 patient had a bout of hypotension and emesis.  Lisinopril was stopped at that time.  Past Medical History    Past Medical History:  Diagnosis Date  . Actinic keratosis   . Allergic rhinitis   . Aneurysm ([place])   . Arthritis    osteoarthritis, back, hands, wrists  . Basal cell carcinoma   . Bursitis    hips bilat   . CAD (coronary artery disease), autologous vein bypass graft March 2008   Follow-up cath: December 2015 Occluded SVG-D1 and occluded SVG-RI.  [name] CAD in native artery 2005, 03/2014   a.  Severe disease noted in LAD & RI --> referred for CABG x3; b.  Follow-up cath for abnormal Myoview: Occluded SVG-RI and SVG-D1 with patent LIMA-LAD and competitive flow.  60-70% LAD and RI lesions.  Otherwise minimal disease.  EF 70%.  . Cancer ([place])    + basal cell- on leg  . Cataract   . Dyslipidemia    Statin intolerant  . Glaucoma   . History of kidney stones   . Hypertension   . Non-STEMI (non-ST elevated myocardial infarction) [facility]) October 2005   EF by 35-40%, echo 40-50%. Angiography: 99% mid LAD involving D1 followed by 70% mid LAD; 80% RI. --> CABG  . Osteoarthritis   . PONV (postoperative nausea and vomiting)   . S/P CABG x 01 February 2004   LIMA-LAD, SVG-D1, SVG-RI.  [name] Statin intolerance   . Stroke [facility])    Past Surgical History:  Procedure Laterality Date  . BUNIONECTOMY     2002  . CARDIOVASCULAR STRESS TEST  05/23/2006   Mild lateral/inferolateral ischemia, likely due to occluded SVGs with exhisting disease.  [name] CAROTID DOPPLER  07/15/2009   Bilat ICAs 0-49% diameter reduction. Normal patency of Bilat subclavian arteries.  . CORONARY ARTERY BYPASS GRAFT  02/22/2004   x3.  LIMA to distal LAD, SVG to first diag, SVG to ramus. SVG harvest from rt thigh.  . EP IMPLANTABLE DEVICE N/A 03/02/2016   Procedure: Loop Recorder Insertion;  Surgeon: Thompson Grayer, MD;  Location: [facility];  Service: Cardiovascular;  Laterality: N/A;  . EXCISION/RELEASE BURSA HIP Right 06/15/2015   Procedure: RIGHT HIP BURSECTOMY WITH GLUTEAL TENDON REPAIR;  Surgeon: Gaynelle Arabian, MD;  Location: [facility];  Service: Orthopedics;  Laterality: Right;  . EYE SURGERY     cataract surgery bilat   . IR ANGIO INTRA EXTRACRAN SEL COM CAROTID INNOMINATE BILAT MOD SED  08/07/2016  . IR ANGIO VERTEBRAL SEL SUBCLAVIAN INNOMINATE BILAT MOD SED  08/07/2016  . IR GENERIC HISTORICAL  06/01/2016   IR RADIOLOGIST EVAL & MGMT 06/01/2016 [facility]-INTER[facility] RAD  . IR RADIOLOGIST EVAL & MGMT  05/07/2017  . LEFT HEART CATHETERIZATION WITH CORONARY ANGIOGRAM  02/19/2004   Significant 2 vessel CAD - LAD, D1 and RI  . LEFT HEART CATHETERIZATION WITH CORONARY/GRAFT ANGIOGRAM N/A 04/28/2014   Procedure: LEFT HEART CATHETERIZATION WITH Beatrix Fetters;  Surgeon: Sinclair Grooms, MD;  [facility] CATH LAB: CTO of SVG-Diag & SVG-RI, patent LIMA-LAD. Patent native circumflex, LAD and RCA. 70% stenosis in a branch of RI. --> Does not explain "high risk perfusion study "  . LEFT HEART CATHETERIZATION WITH  CORONARY/GRAFT ANGIOGRAM   07/25/2006   Totally occluded SVG to diag and ramus. Patent LIMA-LAD. Ramus 70-80% proximal stenosis and occluded vein graft.  [name] left total knee replacement      2001  . NM MYOVIEW LTD  January 2008; December 2015   a. Referred for For mild inferolateral and anterolateral/apical lateral defect with mild reversibility.;; b. Large defect in the anterior and inferior wall. Suggestive of potential infarct plus ischemia. HIGH RISK.  [name] RADIOLOGY WITH ANESTHESIA N/A 08/07/2016   Procedure: EMBOLIZATION;  Surgeon: Luanne Bras, MD;  Location: [facility];  Service: Radiology;  Laterality: N/A;  . right total  knee replacement      2003  . TRANSTHORACIC ECHOCARDIOGRAM  02/19/2004; December 2015   a. EF 45-50%, Normal LV function, moderate hypokinesis of anterior wall.;; b. EF 50-55%. No RWMA, GR 1 DD. Normal valves     Allergies  Allergies  Allergen Reactions  . Nsaids Other (See Comments)    URINARY RETENTION  . Statins Other (See Comments)    MYALGIAS HURTS ALL OVER  . Tricor [Fenofibrate] Other (See Comments)    MYALGIAS HURTS ALL OVER  . Zetia [Ezetimibe] Other (See Comments)    MYALGIAS HURTS ALL OVER  . Lescol [Fluvastatin]     Muscle pain  . Livalo [Pitavastatin]     Muscle pain  . Niaspan [Niacin]     Severe flushing   . Relafen [Nabumetone] Rash    History of Present Illness    [name] has a PMH of coronary artery disease, CABG x 01 February 2004, labile hypertension with significant orthostatic hypotension, cryptogenic stroke/TIA, and dementia.  She was seen in follow-up 11/18 and was noted to be a poor historian.  She was instructed to switch to carvedilol during her hospital discharge however, she had stayed on her atenolol.  Her atenolol was at a reduced dose.  She was last seen by Dr. Ellyn Hack on 05/09/2019.  During that time she was doing well.  She denied chest tightness or pressure.  She was noted to have labile blood pressures.  She was substantially deconditioned and noted to have poor balance.  However she was reluctant to use a walker.  She agreed to use a cane off and on.  On 08/01/2019 patient had a bout of hypotension and emesis.  Lisinopril was stopped at that time.  She presents the clinic today for follow-up evaluation with her granddaughter who is her POA and states she feels well.  She had an episode of vomiting and hypotension while at [facility].  Her lisinopril was held at that time.  [facility] and her lisinopril was restarted.  She has had no further issues with the medication.  Her blood pressures have been ranging in the  [id number] systolic.  She will be moving in with her granddaughter in 2 weeks and I have instructed her to continue the current medication parameters and regimen.  I will have her follow-up with Dr. Ellyn Hack in 3 months.  Today she denies chest pain, shortness of breath, lower extremity edema, fatigue, palpitations, melena, hematuria, hemoptysis, diaphoresis, weakness, presyncope, syncope, orthopnea, and PND.  Home Medications    Prior to Admission medications   Medication Sig Start Date End Date Taking? Authorizing Provider  acetaminophen (TYLENOL) 500 MG tablet Take 500 mg by mouth every 4 (four) hours as needed (for pain).     [provider]  aspirin EC 81 MG tablet Take 81 mg by mouth  at bedtime.     [provider]  atenolol (TENORMIN) 25 MG tablet Take 1 tablet (25 mg total) daily by mouth. May take an additional 25 mg of medication daily, if SBP> 160 or DBP >100 Patient taking differently: Take 25 mg by mouth at bedtime. May take an additional 25 mg of medication daily, if SBP> 160 or DBP >100 03/14/17   Leonie Man, MD  brimonidine (ALPHAGAN P) 0.1 % SOLN Place 1 drop into the left eye in the morning and at bedtime.    [provider]  Cholecalciferol (VITAMIN D3) 50 MCG (2000 UT) capsule Take 1 capsule (2,000 Units total) by mouth at bedtime. 04/09/19   Leonie Man, MD  clopidogrel (PLAVIX) 75 MG tablet Take 1 tablet (75 mg total) by mouth daily. 05/26/19   Leonie Man, MD  donepezil (ARICEPT) 5 MG tablet Take 1 tablet (5 mg total) by mouth at bedtime. 07/10/18   Lomax, Amy, NP  dorzolamide-timolol (COSOPT) 22.3-6.8 MG/ML ophthalmic solution Place 1 drop into the left eye 2 (two) times daily.  11/14/17   [provider]  latanoprost (XALATAN) 0.005 % ophthalmic solution Place 1 drop into both eyes at bedtime. 06/26/16   [provider]  lisinopril (PRINIVIL,ZESTRIL) 20 MG tablet Take 20 mg by mouth at bedtime.     [provider]     Family History    Family History  Problem Relation Age of Onset  . Alzheimer's disease Mother   . Alzheimer's disease Sister   . Hyperlipidemia Sister   . Hypertension Sister   . Diabetes Sister   . Hyperlipidemia Sister   . Hypertension Sister   . Pancreatitis Child    She indicated that her mother is deceased. She indicated that her father is deceased. She indicated that two of her four sisters are alive. She indicated that the status of her child is unknown.  Social History    Social History   Socioeconomic History  . Marital status: Widowed    Spouse name: Not on file  . Number of children: 2  . Years of education: 16+  . Highest education level: Not on file  Occupational History  . Occupation: retired  Tobacco Use  . Smoking status: Never Smoker  . Smokeless tobacco: Never Used  Substance and Sexual Activity  . Alcohol use: Not Currently    Alcohol/week: 1.0 standard drinks    Types: 1 Standard drinks or equivalent per week    Comment: 1 glass of liquor weekly   . Drug use: No  . Sexual activity: Not on file  Other Topics Concern  . Not on file  Social History Narrative   She is a widowed, mother of 2 grandmother of 3.    Lives with granddaughter -[name]   Drinks coffee every morning. 1 cup.   She used to do exercising through the [employer] program as well as the [employer]. Unfortunately, this finding of the classes for seniors changed, and she was unable to make the new classes. She did not like exercising with non-seniors. Besides that she does existing disease, walks up and down the stairs, walk her dog. She is not as active as he had been before.   She never smoked, never drank alcohol.      Social Determinants of Health   Financial Resource Strain:   . Difficulty of Paying Living Expenses:   Food Insecurity:   . Worried About Charity fundraiser in the Last  Year:   . Ran Out of Food in the Last Year:   Transportation Needs:   . Lexicographer (Medical):   [name] Lack of Transportation (Non-Medical):   Physical Activity:   . Days of Exercise per Week:   . Minutes of Exercise per Session:   Stress:   . Feeling of Stress :   Social Connections:   . Frequency of Communication with Friends and Family:   . Frequency of Social Gatherings with Friends and Family:   . Attends Religious Services:   . Active Member of Clubs or Organizations:   . Attends Archivist Meetings:   [name] Marital Status:   Intimate Partner Violence:   . Fear of Current or Ex-Partner:   . Emotionally Abused:   [name] Physically Abused:   . Sexually Abused:      Review of Systems    General:  No chills, fever, night sweats or weight changes.  Cardiovascular:  No chest pain, dyspnea on exertion, edema, orthopnea, palpitations, paroxysmal nocturnal dyspnea. Dermatological: No rash, lesions/masses Respiratory: No cough, dyspnea Urologic: No hematuria, dysuria Abdominal:   No nausea, vomiting, diarrhea, bright red blood per rectum, melena, or hematemesis Neurologic:  No visual changes, wkns, changes in mental status. All other systems reviewed and are otherwise negative except as noted above.  Physical Exam    VS:  BP (!) 146/70 (BP Location: Left Arm, Patient Position: Sitting, Cuff Size: Normal)   Pulse 71   SpO2 91%  , BMI There is no height or weight on file to calculate BMI. GEN: Well nourished, well developed, in no acute distress. HEENT: normal. Neck: Supple, no JVD, carotid bruits, or masses. Cardiac: RRR, no murmurs, rubs, or gallops. No clubbing, cyanosis, edema.  Radials/DP/PT 2+ and equal bilaterally.  Respiratory:  Respirations regular and unlabored, clear to auscultation bilaterally. GI: Soft, nontender, nondistended, BS + x 4. MS: no deformity or atrophy. Skin: warm and dry, no rash. Neuro:  Strength and sensation are intact. Psych: Normal affect.  Accessory Clinical Findings    ECG personally reviewed by me today-none  today.  EKG 07/16/2019 Sinus rhythm with PVCs 69 bpm  Echocardiogram 01/22/2016 Study Conclusions   - Left ventricle: The cavity size was normal. There was mild  concentric hypertrophy. Systolic function was normal. The  estimated ejection fraction was 55%. Wall motion was normal;  there were no regional wall motion abnormalities. Features are  consistent with a pseudonormal left ventricular filling pattern,  with concomitant abnormal relaxation and increased filling  pressure (grade 2 diastolic dysfunction).  - Mitral valve: There was mild regurgitation.  - Tricuspid valve: There was trivial regurgitation.  Nuclear stress test 05/16/2013 Impression Exercise Capacity:  Fair exercise capacity. BP Response:  Normal blood pressure response. Clinical Symptoms:  There is dyspnea. ECG Impression:  Significant ST abnormalities consistent with ischemia. Comparison with Prior Nuclear Study: No images to compare  Overall Impression:  Low risk stress nuclear study with normal perfusion. Fair exercise effort, however, there were 2-3 mm horizontal ST depression inferiorly at peak exercise. Clinical correlation is advised. This may be an electrically false positive study.   Assessment & Plan   1.  Essential hypertension-BP today 146/70.  Labile at home.  Has a long history of labile hypertension with systolic pressures ranging from 120s to 180s.  Previously her blood pressure medications were both taken at dinnertime.  On 08/01/2019 granddaughter called noting that she had had a bout of hypotension and emesis.  Her lisinopril  was held at that time.  [facility] and her lisinopril was restarted. Continue lisinopril 20 mg at bedtime Continue atenolol 25 mg daily Keep blood pressure log, checking at intermittent times May take an additional dose of atenolol for a systolic blood pressure greater than 180 mmHg Regular unrestricted diet Lower extremity  support stockings Increase physical activity as tolerated  Coronary artery disease-autologous vein bypass graft.  CABG times 01 February 2004.  2 vein grafts occluded.  Patent LAD.  Given her advanced age and dementia recommendation was made for no further stress testing. Continue atenolol Continue 81 mg aspirin daily Statin therapy not recommended due to age and dementia  Non-STEMI-no chest pain today.  No cardiac complaints Continue to monitor   Hyperlipidemia-LDL 158 10/10/2016 Previously discussed not having any lipid-lowering agents due to age and dementia.  Memory loss-her dementia continues to progress.  Her granddaughter is her POA.  Disposition: Follow-up with Dr. Ellyn Hack in 3 months.  Jossie Ng. [facility] [place] [address] Office [phone] Fax [phone]

## 2019-08-05 NOTE — Patient Outreach (Signed)
[place] Coordinator follow up  Verified in Patient Ping member dc from [facility] on 08/01/19.   Member transitioned to [facility].   Marthenia Rolling, MSN-Ed, RN,BSN [facility] [facility] Coordinator [phone] [facility]) [phone]  (Toll free office)

## 2019-08-06 ENCOUNTER — Other Ambulatory Visit: Payer: Self-pay

## 2019-08-06 ENCOUNTER — Ambulatory Visit (INDEPENDENT_AMBULATORY_CARE_PROVIDER_SITE_OTHER): Payer: Medicare Other | Admitting: General Practice

## 2019-08-06 ENCOUNTER — Encounter: Payer: Self-pay | Admitting: General Practice

## 2019-08-06 VITALS — BP 146/70 | HR 71

## 2019-08-06 DIAGNOSIS — I25810 Atherosclerosis of coronary artery bypass graft(s) without angina pectoris: Secondary | ICD-10-CM

## 2019-08-06 DIAGNOSIS — I10 Essential (primary) hypertension: Secondary | ICD-10-CM | POA: Diagnosis not present

## 2019-08-06 DIAGNOSIS — I214 Non-ST elevation (NSTEMI) myocardial infarction: Secondary | ICD-10-CM | POA: Diagnosis not present

## 2019-08-06 DIAGNOSIS — R413 Other amnesia: Secondary | ICD-10-CM

## 2019-08-06 DIAGNOSIS — E785 Hyperlipidemia, unspecified: Secondary | ICD-10-CM | POA: Diagnosis not present

## 2019-08-06 NOTE — Patient Instructions (Signed)
Medication Instructions:  Take ATENOLOL 1 tablet (25 mg total) daily by mouth. May take an additional 25 mg of medication daily, if BP> 160/100  PLEASE TAKE BLOOD PRESSURE 1 HOUR AFTER TAKING MEDICATION *If you need a refill on your cardiac medications before your next appointment, please call your pharmacy*  Follow-Up: Your next appointment:  3 month(s)  In Person with You may see Glenetta Hew, MD , Coletta Memos, FNP-C or one of the following Advanced Practice Providers on your designated Care Team:  Rosaria Ferries, PA-C  Jory Sims, DNP, ANP  Cadence Kathlen Mody, NP   At [facility], you and your health needs are our priority.  As part of our continuing mission to provide you with exceptional heart care, we have created designated Provider Care Teams.  These Care Teams include your primary Cardiologist (physician) and Advanced Practice Providers (APPs -  Physician Assistants and Nurse Practitioners) who all work together to provide you with the care you need, when you need it.  We recommend signing up for the patient portal called "MyChart".  Sign up information is provided on this After Visit Summary.  MyChart is used to connect with patients for Virtual Visits (Telemedicine).  Patients are able to view lab/test results, encounter notes, upcoming appointments, etc.  Non-urgent messages can be sent to your provider as well.   To learn more about what you can do with MyChart, go to [email or web address].

## 2019-08-14 DIAGNOSIS — Z09 Encounter for follow-up examination after completed treatment for conditions other than malignant neoplasm: Secondary | ICD-10-CM | POA: Diagnosis not present

## 2019-08-14 DIAGNOSIS — R531 Weakness: Secondary | ICD-10-CM | POA: Diagnosis not present

## 2019-08-14 DIAGNOSIS — I2510 Atherosclerotic heart disease of native coronary artery without angina pectoris: Secondary | ICD-10-CM | POA: Diagnosis not present

## 2019-08-14 DIAGNOSIS — L989 Disorder of the skin and subcutaneous tissue, unspecified: Secondary | ICD-10-CM | POA: Diagnosis not present

## 2019-08-14 DIAGNOSIS — E7800 Pure hypercholesterolemia, unspecified: Secondary | ICD-10-CM | POA: Diagnosis not present

## 2019-08-14 DIAGNOSIS — I10 Essential (primary) hypertension: Secondary | ICD-10-CM | POA: Diagnosis not present

## 2019-08-14 DIAGNOSIS — Z0000 Encounter for general adult medical examination without abnormal findings: Secondary | ICD-10-CM | POA: Diagnosis not present

## 2019-08-14 DIAGNOSIS — Z1389 Encounter for screening for other disorder: Secondary | ICD-10-CM | POA: Diagnosis not present

## 2019-08-21 ENCOUNTER — Ambulatory Visit (INDEPENDENT_AMBULATORY_CARE_PROVIDER_SITE_OTHER): Payer: Medicare Other | Admitting: Family Medicine

## 2019-08-21 ENCOUNTER — Encounter: Payer: Self-pay | Admitting: Family Medicine

## 2019-08-21 ENCOUNTER — Other Ambulatory Visit: Payer: Self-pay

## 2019-08-21 VITALS — BP 126/82 | HR 76 | Temp 97.1°F

## 2019-08-21 DIAGNOSIS — I493 Ventricular premature depolarization: Secondary | ICD-10-CM

## 2019-08-21 DIAGNOSIS — Z9181 History of falling: Secondary | ICD-10-CM

## 2019-08-21 DIAGNOSIS — R531 Weakness: Secondary | ICD-10-CM | POA: Diagnosis not present

## 2019-08-21 DIAGNOSIS — E538 Deficiency of other specified B group vitamins: Secondary | ICD-10-CM | POA: Diagnosis not present

## 2019-08-21 DIAGNOSIS — F0390 Unspecified dementia without behavioral disturbance: Secondary | ICD-10-CM | POA: Diagnosis not present

## 2019-08-21 MED ORDER — DONEPEZIL HCL 10 MG PO TABS: 10.0000 mg | ORAL_TABLET | Freq: Every day | ORAL | 3 refills | Status: DC

## 2019-08-21 MED ORDER — MEMANTINE HCL 5 MG PO TABS: 5.0000 mg | ORAL_TABLET | Freq: Two times a day (BID) | ORAL | 3 refills | Status: DC

## 2019-08-21 NOTE — Patient Instructions (Addendum)
Hold Aricept for now until we clear with cardiology. We can start Namenda 5mg  daily. May increase to 5mg  twice daily in 1 week if well tolerated.   Continue close follow up with PCP. Stay well hydrated. Eat well balanced diet. Try to get physical and mental exercise.   Follow up with [place] in 3 months    Memory Compensation Strategies  1. Use "WARM" strategy.  W= write it down  A= associate it  R= repeat it  M= make a mental note  2.   You can keep a Social worker.  Use a 3-ring notebook with sections for the following: calendar, important names and phone numbers,  medications, doctors' names/phone numbers, lists/reminders, and a section to journal what you did  each day.   3.    Use a calendar to write appointments down.  4.    Write yourself a schedule for the day.  This can be placed on the calendar or in a separate section of the Memory Notebook.  Keeping a  regular schedule can help memory.  5.    Use medication organizer with sections for each day or morning/evening pills.  You may need help loading it  6.    Keep a basket, or pegboard by the door.  Place items that you need to take out with you in the basket or on the pegboard.  You may also want to  include a message board for reminders.  7.    Use sticky notes.  Place sticky notes with reminders in a place where the task is performed.  For example: " turn off the  stove" placed by the stove, "lock the door" placed on the door at eye level, " take your medications" on  the bathroom mirror or by the place where you normally take your medications.  8.    Use alarms/timers.  Use while cooking to remind yourself to check on food or as a reminder to take your medicine, or as a  reminder to make a call, or as a reminder to perform another task, etc.  Memantine Oral Solution What is this medicine? MEMANTINE (MEM an teen) is used to treat dementia caused by Alzheimer's disease. This medicine may be used for other purposes;  ask your health care provider or pharmacist if you have questions. COMMON BRAND NAME(S): Namenda What should I tell my health care provider before I take this medicine? They need to know if you have any of these conditions:  difficulty passing urine  kidney disease  liver disease  seizures  an unusual or allergic reaction to memantine, other medicines, foods, dyes, or preservatives  pregnant or trying to get pregnant  breast-feeding How should I use this medicine? Take this medicine by mouth. Follow the directions on the prescription label. Use the oral dosing syringe provided to correctly measure the dose. Ask your pharmacist how to use the oral dosing syringe if you are not sure. You can take this medicine with or without food. Take your doses at regular intervals. Do not take your medicine more often than directed. Continue to take your medicine even if you feel better. Do not stop taking except on the advice of your doctor or health care professional. Talk to your pediatrician regarding the use of this medicine in children. Special care may be needed. Overdosage: If you think you have taken too much of this medicine contact a poison control center or emergency room at once. NOTE: This medicine is only for  you. Do not share this medicine with others. What if I miss a dose? If you miss a dose, take it as soon as you can. If it is almost time for your next dose, take only that dose. Do not take double or extra doses. If you do not take your medicine for several days, contact your health care provider. Your dose may need to be changed. What may interact with this medicine?  acetazolamide  amantadine  cimetidine  dextromethorphan  dofetilide  hydrochlorothiazide  ketamine  methazolamide  metformin  quinidine  ranitidine  sodium bicarbonate  triamterene This list may not describe all possible interactions. Give your health care provider a list of all the medicines,  herbs, non-prescription drugs, or dietary supplements you use. Also tell them if you smoke, drink alcohol, or use illegal drugs. Some items may interact with your medicine. What should I watch for while using this medicine? Visit your doctor or health care professional for regular checks on your progress. Check with your doctor or health care professional if there is no improvement in your symptoms or if they get worse. You may get drowsy or dizzy. Do not drive, use machinery, or do anything that needs mental alertness until you know how this drug affects you. Do not stand or sit up quickly, especially if you are an older patient. This reduces the risk of dizzy or fainting spells. Alcohol can make you more drowsy and dizzy. Avoid alcoholic drinks. What side effects may I notice from receiving this medicine? Side effects that you should report to your doctor or health care professional as soon as possible:  allergic reactions like skin rash, itching or hives, swelling of the face, lips, or tongue  agitation or a feeling of restlessness  depressed mood  dizziness  hallucinations  redness, blistering, peeling or loosening of the skin, including inside the mouth  seizures  vomiting Side effects that usually do not require medical attention (report to your doctor or health care professional if they continue or are bothersome):  constipation  diarrhea  headache  nausea  trouble sleeping This list may not describe all possible side effects. Call your doctor for medical advice about side effects. You may report side effects to FDA at 1-800-FDA-1088. Where should I keep my medicine? Keep out of the reach of children. Store at room temperature between 15 degrees and 30 degrees C (59 degrees and 86 degrees F). Throw away any unused medicine after the expiration date. NOTE: This sheet is a summary. It may not cover all possible information. If you have questions about this medicine, talk to  your doctor, pharmacist, or health care provider.  2020 [employer]/Gold Standard (2013-02-03 14:09:55)   Donepezil tablets What is this medicine? DONEPEZIL (doe NEP e zil) is used to treat mild to moderate dementia caused by Alzheimer's disease. This medicine may be used for other purposes; ask your health care provider or pharmacist if you have questions. COMMON BRAND NAME(S): Aricept What should I tell my health care provider before I take this medicine? They need to know if you have any of these conditions:  asthma or other lung disease  difficulty passing urine  head injury  heart disease  history of irregular heartbeat  liver disease  seizures (convulsions)  stomach or intestinal disease, ulcers or stomach bleeding  an unusual or allergic reaction to donepezil, other medicines, foods, dyes, or preservatives  pregnant or trying to get pregnant  breast-feeding How should I use this medicine? Take this  medicine by mouth with a glass of water. Follow the directions on the prescription label. You may take this medicine with or without food. Take this medicine at regular intervals. This medicine is usually taken before bedtime. Do not take it more often than directed. Continue to take your medicine even if you feel better. Do not stop taking except on your doctor's advice. If you are taking the 23 mg donepezil tablet, swallow it whole; do not cut, crush, or chew it. Talk to your pediatrician regarding the use of this medicine in children. Special care may be needed. Overdosage: If you think you have taken too much of this medicine contact a poison control center or emergency room at once. NOTE: This medicine is only for you. Do not share this medicine with others. What if I miss a dose? If you miss a dose, take it as soon as you can. If it is almost time for your next dose, take only that dose, do not take double or extra doses. What may interact with this medicine? Do not  take this medicine with any of the following medications:  certain medicines for fungal infections like itraconazole, fluconazole, posaconazole, and voriconazole  cisapride  dextromethorphan; quinidine  dronedarone  pimozide  quinidine  thioridazine This medicine may also interact with the following medications:  antihistamines for allergy, cough and cold  atropine  bethanechol  carbamazepine  certain medicines for bladder problems like oxybutynin, tolterodine  certain medicines for Parkinson's disease like benztropine, trihexyphenidyl  certain medicines for stomach problems like dicyclomine, hyoscyamine  certain medicines for travel sickness like scopolamine  dexamethasone  dofetilide  ipratropium  NSAIDs, medicines for pain and inflammation, like ibuprofen or naproxen  other medicines for Alzheimer's disease  other medicines that prolong the QT interval (cause an abnormal heart rhythm)  phenobarbital  phenytoin  rifampin, rifabutin or rifapentine  ziprasidone This list may not describe all possible interactions. Give your health care provider a list of all the medicines, herbs, non-prescription drugs, or dietary supplements you use. Also tell them if you smoke, drink alcohol, or use illegal drugs. Some items may interact with your medicine. What should I watch for while using this medicine? Visit your doctor or health care professional for regular checks on your progress. Check with your doctor or health care professional if your symptoms do not get better or if they get worse. You may get drowsy or dizzy. Do not drive, use machinery, or do anything that needs mental alertness until you know how this drug affects you. What side effects may I notice from receiving this medicine? Side effects that you should report to your doctor or health care professional as soon as possible:  allergic reactions like skin rash, itching or hives, swelling of the face, lips,  or tongue  feeling faint or lightheaded, falls  loss of bladder control  seizures  signs and symptoms of a dangerous change in heartbeat or heart rhythm like chest pain; dizziness; fast or irregular heartbeat; palpitations; feeling faint or lightheaded, falls; breathing problems  signs and symptoms of infection like fever or chills; cough; sore throat; pain or trouble passing urine  signs and symptoms of liver injury like dark yellow or brown urine; general ill feeling or flu-like symptoms; light-colored stools; loss of appetite; nausea; right upper belly pain; unusually weak or tired; yellowing of the eyes or skin  slow heartbeat or palpitations  unusual bleeding or bruising  vomiting Side effects that usually do not require medical attention (report  to your doctor or health care professional if they continue or are bothersome):  diarrhea, especially when starting treatment  headache  loss of appetite  muscle cramps  nausea  stomach upset This list may not describe all possible side effects. Call your doctor for medical advice about side effects. You may report side effects to FDA at 1-800-FDA-1088. Where should I keep my medicine? Keep out of reach of children. Store at room temperature between 15 and 30 degrees C (59 and 86 degrees F). Throw away any unused medicine after the expiration date. NOTE: This sheet is a summary. It may not cover all possible information. If you have questions about this medicine, talk to your doctor, pharmacist, or health care provider.  2020 [employer]/Gold Standard (2018-04-08 10:33:41)

## 2019-08-21 NOTE — Progress Notes (Addendum)
PATIENT: [name] DOB: 1931-05-18  REASON FOR VISIT: follow up HISTORY FROM: patient  Chief Complaint  Patient presents with  . Dementia    rm 6, one year FU, grand dgtr, HC POA- Christine  MMSE     HISTORY OF PRESENT ILLNESS: Today 08/24/19 [name] is a 86 y.o. female here today for follow up memory loss. She has continued to have declining symptoms. She lives at home with her granddaughter. Memory has continued to decline. She had a fall about 6 weeks ago and was hospitalized. She was in rehab for 2 weeks then in a memory unit for two weeks. She went home last week. She continues to have difficulty walking. She feels that her legs are weak. She is able to use her walker with 1 person assist. She needs guidance with direction. She has a hard time getting up from the toilet. She does have a raised toilet and shower bar. Aricept prescribed last year but patient only recently started in rehab. Her granddaughter reports improvement in hallucinations since starting Aricept. She did have cardiology eval for labile BP and pulse in rehab. No chest pain, shob, difficulty breathing or dizziness. No GI symptoms since starting Aricept.   HISTORY: (copied from my note on 07/10/2018)  [name] is a 86 y.o. female here today for follow up for memory loss. She was found to have B12 deficiency at last visit with [name] in 10/2017. She received 4 weekly injections and will complete her 6th monthly injection today. She presents today with her granddaughter that has not noticed any improvement in her memory. She is now requiring assistance to dress. She continues to be independent with bathing. She is dosing her own medications. She does not drive and cannot manage finances. She lives with her granddaughter and is staying home while she works.  She continues to have visual hallucinations at home.  Her granddaughter reports that she is seeing children and other people who are not in the  home.  She does not appear frightened or disturbed by these hallucinations.  There are no other behavioral concerns.   HISTORY: (copied from [name] note on 11/20/2017) Interval history 11/20/2017: Patient is here for new problem, dementia. In the past I have seen her for aneurysms and she denied memory loss and declined any workup. Today she returns for worsening symptoms. She says she "loses her memory". Will order a CTA of the head and neck to follow aneurysms per [name] recommendations. She hasn't driven in 2 years. Lives with granddaughter. She has hallucinations and she can't retain short-term memories and she reads and can't understand reading she has difficulty with speech and is sometimes aphasic and cannot recall names of things and may need to describe them. Grandaughter says she repeats the same questions in the same day, hearing issue unclear recommend having hearing check (speak with pcp). She has some behavior changes, maybe a and he sees thins that are not there, patient does not remember last time happened in May. More short-term memory deteriorating she can remember stories from the past and progressively worsening. When tired or end of the day is worse or in unfamiliar circumstances, no wandering at night. They are working on [employer] and autopay wth granddaughter. She manages her own medications and doesn't miss any pills. Mother had Alzheimers.   IMPRESSION: 1. No acute intracranial abnormality identified. 2. Stable small chronic infarcts within right midbrain and bilateral cerebellar hemispheres. 3. Stable moderate  chronic microvascular ischemic changes and parenchymal volume loss of the brain.  Interval history 09/06/2016: She was seen by [name] and at this point he is watching the aneurysms. She is going to followup with him in 6 months. She lives with her granddaughter [name]. Patient denies any significant memory issues. Declines eval or treatment. Patient  doesn't want follow up/ I recommended a year follow up at least, would prefer every 6 months. She follows with [name] and he is managing her glaucoma and other disorders.   Interval history 05/09/2016: Her blood pressure varies from [id number] systolic up to [id number] per patient and today it is [id number] systolic. But often times it is low at home per patient. I have asked her document blood pressures daily and take them to her pcp for management of her BP medications. I discussed her memory today. Patient was having hallucinations, men and women in [place] garb, cats and dogs and little kids. They had decorated the credenza and sometimes they brush against her and she can feel it. She knows they are not real. It bothers her a little bit. 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 She is having significant vision changes, will refer to ophthalmology [name] for evaluation. Sometimes the hallucinations would be on the wall other times int he middle of the room. She lives with her granddaughter. She pays her own bills but having more difficulty with paying bills, she is having a lot of vision changes which is impacting her. She can't read anymore. She is having difficulty with comprehension. She opens her purse and can't find the zipper because of sight. No more episodes of confusion per patient. Discussed an ambulatory 72-hour eeg but she declines. She denies any memory changes.   Interval history: Her blood pressure is extremely elevated, she has an appt this month, not symptomatic. She had an episode of confusion, couldn;t dial anybody, couldn;t remember anyone's names, She was in the house and no one else was there and she felt like she was having another episode. She remembers the whole event, she called 911 and the ambulance came. Repeat MRi did not show any new strokes. She has not had any episodes since then. She was able to talk to EMS. She has been dizzy after taking  propranolol, advised to discuss with primary including elevated blood prussure. Will order CTA of the head to further examine aneurysms, warned she needs to manage her BP due to risk of rupture, follow up with pcp int he next few weeks. Friend with her, they both acknowledge understanding.  [id number] A Laytonis a 86 y.o.femalehere as a referral from Dr. Denzil Magnuson TIA. She has a past medical history of vitamin D deficiency, high cholesterol, hypertension, atherosclerotic heart disease, osteoarthritis, glaucoma, history of coronary artery bypass, TIA. Patient was out shopping and she suddenly couldn't talk, words wouldn;t come. No inciting event or previous illnesses or head trauma. MRi of the brain was negative for acute event. Friend is here and provides information. Friend says patient was confused for a short period of time afterwards, Patient couldn't find her keys, she was confused, had a difficult time finding money right in her purse, she couldn;t make complete sentences. Patient says she remembers it all, no LOC or altered awareness. She was thinking but she couldn't make words come out. Symptoms resolved completely. However she is also having hallucination recently, she is also seeing people, it just started a day and  a half ago, she is seeing people one had a cat with them and she sawsomeone in the yard. They were in her bedroom all night. Just started since yesterday, in the past week patient is more confused, she was seen Monday with Dr. Tamala Julian and they say a work up for metabolic/infectios causes ruled out however they do not think urine was taken. Patient is more confused in the last few weeks. She is having difficulty saying things, not completingsentences. More confusion. Also progressive memory changes the last year. Sister and mother with Alzheimer's dz  Cholesterol LDL 151. CMP 08/16/2015 with BUN 24 and creatinine 0.91.hgba1c 5.4.  Reviewed notes, labs and imaging from outside  physicians, which showed:  Personally reviewed MRI images of the brain and garee with the following: IMPRESSION: 1. No acute intracranial abnormality. 2. Moderate chronic small vessel ischemic disease. 3. Chronic cerebellar infarcts. 4. No major intracranial branch vessel occlusion or significant proximal stenosis. 5. 5 mm right ICA paraophthalmic aneurysm. 6. Possible 3 mm left ICA aneurysm near the petrous-cavernous Junction.  EEG normal. ECHO did not show any clots in the heart.   REVIEW OF SYSTEMS: Out of a complete 14 system review of symptoms, the patient complains only of the following symptoms, memory loss, hallucinations, recent fall and all other reviewed systems are negative.  ALLERGIES: Allergies  Allergen Reactions  . Nsaids Other (See Comments)    URINARY RETENTION  . Statins Other (See Comments)    MYALGIAS HURTS ALL OVER  . Tricor [Fenofibrate] Other (See Comments)    MYALGIAS HURTS ALL OVER  . Zetia [Ezetimibe] Other (See Comments)    MYALGIAS HURTS ALL OVER  . Lescol [Fluvastatin]     Muscle pain  . Livalo [Pitavastatin]     Muscle pain  . Niaspan [Niacin]     Severe flushing   . Relafen [Nabumetone] Rash    HOME MEDICATIONS: Outpatient Medications Prior to Visit  Medication Sig Dispense Refill  . acetaminophen (TYLENOL) 500 MG tablet Take 500 mg by mouth every 4 (four) hours as needed (for pain).     [name] aspirin EC 81 MG tablet Take 81 mg by mouth at bedtime.     [name] atenolol (TENORMIN) 25 MG tablet Take 1 tablet (25 mg total) daily by mouth. May take an additional 25 mg of medication daily, if SBP> 160 or DBP >100 (Patient taking differently: Take 25 mg by mouth at bedtime. May take an additional 25 mg of medication daily, if SBP> 160 or DBP >100) 60 tablet 11  . brimonidine (ALPHAGAN P) 0.1 % SOLN Place 1 drop into the left eye in the morning and at bedtime.    . Cholecalciferol (VITAMIN D3) 50 MCG (2000 UT) capsule Take 1 capsule (2,000 Units total)  by mouth at bedtime. 90 capsule 2  . clopidogrel (PLAVIX) 75 MG tablet Take 1 tablet (75 mg total) by mouth daily. 90 tablet 3  . dorzolamide-timolol (COSOPT) 22.3-6.8 MG/ML ophthalmic solution Place 1 drop into the left eye 2 (two) times daily.   3  . latanoprost (XALATAN) 0.005 % ophthalmic solution Place 1 drop into both eyes at bedtime.  3  . lisinopril (PRINIVIL,ZESTRIL) 20 MG tablet Take 20 mg by mouth at bedtime.     . donepezil (ARICEPT) 5 MG tablet Take 1 tablet (5 mg total) by mouth at bedtime. 90 tablet 3   No facility-administered medications prior to visit.    PAST MEDICAL HISTORY: Past Medical History:  Diagnosis Date  . Actinic  keratosis   . Allergic rhinitis   . Aneurysm ([place])   . Arthritis    osteoarthritis, back, hands, wrists  . Basal cell carcinoma   . Bursitis    hips bilat   . CAD (coronary artery disease), autologous vein bypass graft March 2008   Follow-up cath: December 2015 Occluded SVG-D1 and occluded SVG-RI.  [name] CAD in native artery 2005, 03/2014   a.  Severe disease noted in LAD & RI --> referred for CABG x3; b.  Follow-up cath for abnormal Myoview: Occluded SVG-RI and SVG-D1 with patent LIMA-LAD and competitive flow.  60-70% LAD and RI lesions.  Otherwise minimal disease.  EF 70%.  . Cancer ([place])    + basal cell- on leg  . Cataract   . Dementia ([place])   . Dyslipidemia    Statin intolerant  . Glaucoma   . History of kidney stones   . Hypertension   . Non-STEMI (non-ST elevated myocardial infarction) [facility]) October 2005   EF by 35-40%, echo 40-50%. Angiography: 99% mid LAD involving D1 followed by 70% mid LAD; 80% RI. --> CABG  . Osteoarthritis   . PONV (postoperative nausea and vomiting)   . S/P CABG x 01 February 2004   LIMA-LAD, SVG-D1, SVG-RI.  [name] Statin intolerance   . Stroke [facility])     PAST SURGICAL HISTORY: Past Surgical History:  Procedure Laterality Date  . BUNIONECTOMY     2002  . CARDIOVASCULAR STRESS TEST  05/23/2006   Mild  lateral/inferolateral ischemia, likely due to occluded SVGs with exhisting disease.  [name] CAROTID DOPPLER  07/15/2009   Bilat ICAs 0-49% diameter reduction. Normal patency of Bilat subclavian arteries.  . CORONARY ARTERY BYPASS GRAFT  02/22/2004   x3. LIMA to distal LAD, SVG to first diag, SVG to ramus. SVG harvest from rt thigh.  . EP IMPLANTABLE DEVICE N/A 03/02/2016   Procedure: Loop Recorder Insertion;  Surgeon: Thompson Grayer, MD;  Location: [facility];  Service: Cardiovascular;  Laterality: N/A;  . EXCISION/RELEASE BURSA HIP Right 06/15/2015   Procedure: RIGHT HIP BURSECTOMY WITH GLUTEAL TENDON REPAIR;  Surgeon: Gaynelle Arabian, MD;  Location: [facility];  Service: Orthopedics;  Laterality: Right;  . EYE SURGERY     cataract surgery bilat   . IR ANGIO INTRA EXTRACRAN SEL COM CAROTID INNOMINATE BILAT MOD SED  08/07/2016  . IR ANGIO VERTEBRAL SEL SUBCLAVIAN INNOMINATE BILAT MOD SED  08/07/2016  . IR GENERIC HISTORICAL  06/01/2016   IR RADIOLOGIST EVAL & MGMT 06/01/2016 [facility]-INTERV RAD  . IR RADIOLOGIST EVAL & MGMT  05/07/2017  . LEFT HEART CATHETERIZATION WITH CORONARY ANGIOGRAM  02/19/2004   Significant 2 vessel CAD - LAD, D1 and RI  . LEFT HEART CATHETERIZATION WITH CORONARY/GRAFT ANGIOGRAM N/A 04/28/2014   Procedure: LEFT HEART CATHETERIZATION WITH Beatrix Fetters;  Surgeon: Sinclair Grooms, MD;  Location: [facility] CATH LAB: CTO of SVG-Diag & SVG-RI, patent LIMA-LAD. Patent native circumflex, LAD and RCA. 70% stenosis in a branch of RI. --> Does not explain "high risk perfusion study "  . LEFT HEART CATHETERIZATION WITH CORONARY/GRAFT ANGIOGRAM   07/25/2006   Totally occluded SVG to diag and ramus. Patent LIMA-LAD. Ramus 70-80% proximal stenosis and occluded vein graft.  [name] left total knee replacement      2001  . NM MYOVIEW LTD  January 2008; December 2015   a. Referred for For mild inferolateral and anterolateral/apical lateral defect with mild reversibility.;; b. Large defect in the anterior and  inferior wall. Suggestive  of potential infarct plus ischemia. HIGH RISK.  [name] RADIOLOGY WITH ANESTHESIA N/A 08/07/2016   Procedure: EMBOLIZATION;  Surgeon: Luanne Bras, MD;  Location: [facility];  Service: Radiology;  Laterality: N/A;  . right total knee replacement      2003  . TRANSTHORACIC ECHOCARDIOGRAM  02/19/2004; December 2015   a. EF 45-50%, Normal LV function, moderate hypokinesis of anterior wall.;; b. EF 50-55%. No RWMA, GR 1 DD. Normal valves     FAMILY HISTORY: Family History  Problem Relation Age of Onset  . Alzheimer's disease Mother   . Alzheimer's disease Sister   . Hyperlipidemia Sister   . Hypertension Sister   . Diabetes Sister   . Hyperlipidemia Sister   . Hypertension Sister   . Pancreatitis Child     SOCIAL HISTORY: Social History   Socioeconomic History  . Marital status: Widowed    Spouse name: Not on file  . Number of children: 2  . Years of education: 16+  . Highest education level: Not on file  Occupational History  . Occupation: retired  Tobacco Use  . Smoking status: Never Smoker  . Smokeless tobacco: Never Used  Substance and Sexual Activity  . Alcohol use: Not Currently    Alcohol/week: 1.0 standard drinks    Types: 1 Standard drinks or equivalent per week    Comment: 1 glass of liquor weekly   . Drug use: No  . Sexual activity: Not on file  Other Topics Concern  . Not on file  Social History Narrative   She is a widowed, mother of 2 grandmother of 3.    Lives with granddaughter -[name]   Drinks coffee every morning. 1 cup.   She used to do exercising through the [employer] program as well as the [employer]. Unfortunately, this finding of the classes for seniors changed, and she was unable to make the new classes. She did not like exercising with non-seniors. Besides that she does existing disease, walks up and down the stairs, walk her dog. She is not as active as he had been before.   She never smoked, never drank alcohol.       Social Determinants of Health   Financial Resource Strain:   . Difficulty of Paying Living Expenses:   Food Insecurity:   . Worried About Charity fundraiser in the Last Year:   . Arboriculturist in the Last Year:   Transportation Needs:   . Film/video editor (Medical):   [name] Lack of Transportation (Non-Medical):   Physical Activity:   . Days of Exercise per Week:   . Minutes of Exercise per Session:   Stress:   . Feeling of Stress :   Social Connections:   . Frequency of Communication with Friends and Family:   . Frequency of Social Gatherings with Friends and Family:   . Attends Religious Services:   . Active Member of Clubs or Organizations:   . Attends Archivist Meetings:   [name] Marital Status:   Intimate Partner Violence:   . Fear of Current or Ex-Partner:   . Emotionally Abused:   [name] Physically Abused:   . Sexually Abused:       PHYSICAL EXAM  Vitals:   08/21/19 1513  BP: 126/82  Pulse: 76  Temp: (!) 97.1 F (36.2 C)   There is no height or weight on file to calculate BMI.  Generalized: Well developed, in no acute distress  Cardiology: normal rate with periodically irregular  rhythm, no murmur noted Respiratory: clear to auscultation bilaterally  Neurological examination  Mentation: Alert, not oriented to time but she is oriented to place, can assist with some history taking. Follows all commands speech and language fluent Cranial nerve II-XII: Pupils were equal round reactive to light. Extraocular movements were full, visual field were full on confrontational test. Facial sensation and strength were normal. Head turning and shoulder shrug  were normal and symmetric. Motor: The motor testing reveals 4 over 5 strength of all 4 extremities. Decreased but symmetric motor tone is noted throughout.  Sensory: Sensory testing is intact to soft touch on all 4 extremities. No evidence of extinction is noted.  Coordination: Cerebellar testing reveals reduced  finger-nose-finger and heel-to-shin bilaterally.  Gait and station: Gait is unstable, patient able to take 1-2 steps, in wheelchair today. Reflexes: Deep tendon reflexes are symmetric and normal bilaterally.   DIAGNOSTIC DATA (LABS, IMAGING, TESTING) - I reviewed patient records, labs, notes, testing and imaging myself where available.  MMSE - Mini Mental State Exam 08/21/2019 07/10/2018 11/20/2017  Not completed: - (No Data) -  Orientation to time 0 1 5  Orientation to Place 4 3 5   Registration 3 3 3   Attention/ Calculation 1 1 3   Recall 0 2 2  Language- name 2 objects 2 2 2   Language- repeat 0 1 1  Language- follow 3 step command 2 2 3   Language- read & follow direction 1 1 1   Write a sentence 0 1 1  Write a sentence-comments no subject - -  Copy design 0 0 0  Total score 13 17 26      Lab Results  Component Value Date   WBC 5.7 07/18/2019   HGB 14.0 07/18/2019   HCT 41.6 07/18/2019   MCV 92.7 07/18/2019   PLT 222 07/18/2019      Component Value Date/Time   NA 140 07/17/2019 0551   NA 145 (H) 11/20/2017 1113   K 3.8 07/17/2019 0551   CL 108 07/17/2019 0551   CO2 25 07/17/2019 0551   GLUCOSE 95 07/17/2019 0551   BUN 14 07/17/2019 0551   BUN 12 11/20/2017 1113   CREATININE 0.73 07/17/2019 0551   CALCIUM 9.2 07/17/2019 0551   PROT 6.3 (L) 07/15/2019 1237   PROT 6.8 11/20/2017 1113   ALBUMIN 3.9 07/15/2019 1237   ALBUMIN 4.6 11/20/2017 1113   AST 64 (H) 07/15/2019 1237   ALT 25 07/15/2019 1237   ALKPHOS 76 07/15/2019 1237   BILITOT 1.0 07/15/2019 1237   BILITOT 0.7 11/20/2017 1113   GFRNONAA >60 07/17/2019 0551   GFRAA >60 07/17/2019 0551   Lab Results  Component Value Date   CHOL 237 (H) 10/10/2016   HDL 62 10/10/2016   LDLCALC 158 (H) 10/10/2016   TRIG 85 10/10/2016   CHOLHDL 3.8 10/10/2016   Lab Results  Component Value Date   HGBA1C 5.7 (H) 10/10/2016   Lab Results  Component Value Date   VITAMINB12 193 (L) 11/20/2017   Lab Results  Component  Value Date   TSH 2.300 11/20/2017       ASSESSMENT AND PLAN 86 y.o. year old female  has a past medical history of Actinic keratosis, Allergic rhinitis, Aneurysm ([place]), Arthritis, Basal cell carcinoma, Bursitis, CAD (coronary artery disease), autologous vein bypass graft (March 2008), CAD in native artery (2005, 03/2014), Cancer ([place]), Cataract, Dementia ([place]), Dyslipidemia, Glaucoma, History of kidney stones, Hypertension, Non-STEMI (non-ST elevated myocardial infarction) [facility]) (October 2005), Osteoarthritis, PONV (postoperative nausea and vomiting),  S/P CABG x 3 (October 2005), Statin intolerance, and Stroke [facility]). here with     ICD-10-CM   1. Dementia without behavioral disturbance, unspecified dementia type ([place])  F03.90 Ambulatory referral to Home Health  2. Vitamin B12 deficiency  E53.8   3. Frequent PVCs  I49.3   4. History of recent fall  Z91.81 Ambulatory referral to Derma weakness  R53.1 Ambulatory referral to [place] continues to recover following a recent hospitalization status post in March.  Her granddaughter reports that she is doing somewhat better.  Her energy and strength seems to be improving.  She has been started on Aricept about 3 weeks ago.  She was tolerating medication well and her granddaughter feels that hallucinations have decreased in frequency.  Due to her cardiac history and concerns of labile blood pressure and pulse in rehab, I have reached out to her cardiologist.  After review of her recent visit with cardiology and input from her provider, we will continue Aricept 5 mg daily.  I will add Namenda 5 mg daily and titrate according to response.  She was given strict precautions and when to call including any concerns of chest pain, palpitations, shortness of breath, dizziness, changes in speech or weakness.  I will also order home health for evaluation.  I feel that she would benefit from PT and OT at home.  May need additional DME  equipment as determined by evaluation.  Close follow-up with primary care advised for management of comorbidities and monitoring of B12.  With her return for follow-up in 3 months, sooner if needed.  She and her granddaughter both verbalized understanding and agreement with this plan.   Orders Placed This Encounter  Procedures  . Ambulatory referral to Home Health    Referral Priority:   Routine    Referral Type:   Home Health Care    Referral Reason:   Specialty Services Required    Requested Specialty:   [place]    Number of Visits Requested:   1     Meds ordered this encounter  Medications  . donepezil (ARICEPT) 10 MG tablet    Sig: Take 1 tablet (10 mg total) by mouth at bedtime.    Dispense:  90 tablet    Refill:  3    Order Specific Question:   Supervising Provider    Answer:   Melvenia Beam [id number]  . memantine (NAMENDA) 5 MG tablet    Sig: Take 1 tablet (5 mg total) by mouth 2 (two) times daily.    Dispense:  180 tablet    Refill:  3    Order Specific Question:   Supervising Provider    Answer:   Melvenia Beam [id number]      I spent 45 minutes with the patient. 50% of this time was spent counseling and educating patient on plan of care and medications.    Debbora Presto, FNP-C 08/24/2019, 11:05 AM [facility] [address] [place], [place] [address] [phone]  Made any corrections needed, and agree with history, physical, neuro exam,assessment and plan as stated.     Sarina Ill, MD [facility]

## 2019-08-24 ENCOUNTER — Encounter: Payer: Self-pay | Admitting: Family Medicine

## 2019-08-24 DIAGNOSIS — F0390 Unspecified dementia without behavioral disturbance: Secondary | ICD-10-CM | POA: Insufficient documentation

## 2019-08-24 DIAGNOSIS — I493 Ventricular premature depolarization: Secondary | ICD-10-CM | POA: Insufficient documentation

## 2019-08-24 DIAGNOSIS — Z9181 History of falling: Secondary | ICD-10-CM | POA: Insufficient documentation

## 2019-08-28 ENCOUNTER — Telehealth: Payer: Self-pay | Admitting: *Deleted

## 2019-08-28 NOTE — Telephone Encounter (Signed)
-----   Message from Debbora Presto, NP sent at 08/27/2019  7:25 AM EDT ----- Regarding: FW: Medication side effects Can you please call her granddaughter and let her know that Denyse Amass with cardiology did respond to our questions about continuing Aricept. He feels that it is safe to continue Aricept since she was tolerating it well and not having any side effects. Let her know that we can resume Aricept 5mg  daily. She can add Namenda as discussed. Start 5mg  daily then increase to 5mg  twice daily if well tolerated. We can increase dose of both as long as they are well tolerated. Have her granddaughter keep in touch with [place] to let us know how she is doing. TY! ----- Message ----- From: Deberah Pelton, NP Sent: 08/21/2019   5:05 PM EDT To: Debbora Presto, NP Subject: RE: Medication side effects                    Hi Amy,  I reviewed Mrs. Bouillon's EKG from her most recent visit.  PVCs were noted during the EKG.  In my opinion I feel it is safe to continue her Aricept use.  Sounds like she is doing well on medication.  Please let us know if she has any cardiac complaints and we can repeat her EKG order further testing if needed.  Thank you for reaching out.  Denyse Amass  ----- Message ----- From: Debbora Presto, NP Sent: 08/21/2019   4:51 PM EDT To: Deberah Pelton, NP Subject: Medication side effects                        Good afternoon, Denyse Amass.  I saw Mrs. Thorburn for follow-up in the office today.  We had started Aricept 5 mg with her once daily about a year ago.  Patient and her granddaughter report that this medication was not started until about 3 weeks ago when going to a skilled nursing facility.  They were telling me that while in the facility, she was having difficulty with a low pulse and labile blood pressures.  I know that you saw her in the office on April 7.  I was hopeful that you could look at this case with me and help me determine whether Aricept is safe to continue.  She has tolerated it well, however,  with exam I do hear some abnormal heartbeats.  Although I know it is difficult to tell, I feel these abnormal beats were most consistent with PVCs.  It did not sound like atrial fibrillation.  Unfortunately, we do not have an EKG in the office where I could been certain.  Patient does have a significant cardiac history and I wanted to get your thoughts on continuing the Aricept.  Patient does feel that it is helped somewhat with her memory.  She feels that hallucinations are less frequent over the past 2 weeks.  No other side effects noted.  No chest pain, shortness of breath or trouble breathing.  Thank you so much for your help!  Amy

## 2019-08-28 NOTE — Telephone Encounter (Signed)
LMVM for grand daughter to call me back.

## 2019-09-01 ENCOUNTER — Emergency Department (HOSPITAL_COMMUNITY): Payer: Medicare Other

## 2019-09-01 ENCOUNTER — Emergency Department (HOSPITAL_COMMUNITY)
Admission: EM | Admit: 2019-09-01 | Discharge: 2019-09-01 | Disposition: A | Discharge status: Home / Self Care | Payer: Medicare Other | Attending: Emergency Medicine | Admitting: Emergency Medicine

## 2019-09-01 ENCOUNTER — Encounter: Payer: Self-pay | Admitting: *Deleted

## 2019-09-01 ENCOUNTER — Encounter (HOSPITAL_COMMUNITY): Payer: Self-pay | Admitting: Emergency Medicine

## 2019-09-01 DIAGNOSIS — S79912A Unspecified injury of left hip, initial encounter: Secondary | ICD-10-CM | POA: Diagnosis not present

## 2019-09-01 DIAGNOSIS — W010XXA Fall on same level from slipping, tripping and stumbling without subsequent striking against object, initial encounter: Secondary | ICD-10-CM | POA: Insufficient documentation

## 2019-09-01 DIAGNOSIS — S7001XA Contusion of right hip, initial encounter: Secondary | ICD-10-CM | POA: Insufficient documentation

## 2019-09-01 DIAGNOSIS — S79911A Unspecified injury of right hip, initial encounter: Secondary | ICD-10-CM | POA: Diagnosis not present

## 2019-09-01 DIAGNOSIS — S7000XA Contusion of unspecified hip, initial encounter: Secondary | ICD-10-CM

## 2019-09-01 DIAGNOSIS — F0390 Unspecified dementia without behavioral disturbance: Secondary | ICD-10-CM | POA: Insufficient documentation

## 2019-09-01 DIAGNOSIS — R410 Disorientation, unspecified: Secondary | ICD-10-CM | POA: Diagnosis not present

## 2019-09-01 DIAGNOSIS — M2550 Pain in unspecified joint: Secondary | ICD-10-CM | POA: Diagnosis not present

## 2019-09-01 DIAGNOSIS — Y9389 Activity, other specified: Secondary | ICD-10-CM | POA: Insufficient documentation

## 2019-09-01 DIAGNOSIS — S7002XA Contusion of left hip, initial encounter: Secondary | ICD-10-CM | POA: Insufficient documentation

## 2019-09-01 DIAGNOSIS — S3992XA Unspecified injury of lower back, initial encounter: Secondary | ICD-10-CM | POA: Diagnosis not present

## 2019-09-01 DIAGNOSIS — R5381 Other malaise: Secondary | ICD-10-CM | POA: Diagnosis not present

## 2019-09-01 DIAGNOSIS — Y999 Unspecified external cause status: Secondary | ICD-10-CM | POA: Diagnosis not present

## 2019-09-01 DIAGNOSIS — R4182 Altered mental status, unspecified: Secondary | ICD-10-CM | POA: Diagnosis not present

## 2019-09-01 DIAGNOSIS — M25552 Pain in left hip: Secondary | ICD-10-CM | POA: Diagnosis not present

## 2019-09-01 DIAGNOSIS — R52 Pain, unspecified: Secondary | ICD-10-CM | POA: Diagnosis not present

## 2019-09-01 DIAGNOSIS — Z7401 Bed confinement status: Secondary | ICD-10-CM | POA: Diagnosis not present

## 2019-09-01 DIAGNOSIS — Y929 Unspecified place or not applicable: Secondary | ICD-10-CM | POA: Insufficient documentation

## 2019-09-01 DIAGNOSIS — S0990XA Unspecified injury of head, initial encounter: Secondary | ICD-10-CM | POA: Diagnosis not present

## 2019-09-01 DIAGNOSIS — W19XXXA Unspecified fall, initial encounter: Secondary | ICD-10-CM

## 2019-09-01 NOTE — ED Notes (Signed)
Pt. Stood up with the assistance of the Dr. Kathrynn Humble) and the tech Ermalene Postin). Pt. Was instructed to do  leg exercise before being discharged. Nurse aware.

## 2019-09-01 NOTE — ED Notes (Signed)
[facility] notified about transport back to home needed.

## 2019-09-01 NOTE — ED Triage Notes (Signed)
Per PT[facility] pt from home where she lives with her daughter. Pt has dementia and today her caretaker didn't show up and when her daughter was upstairs working she heard patient fall. Pt grimaces when touch both hips. Has bruise on right hip from previous fall. Vitals: 140/80, 90HR, 18R, 97% ON RA.

## 2019-09-01 NOTE — Discharge Instructions (Addendum)
We saw you in the ER after you had a fall. °All the imaging results are normal, no fractures seen. No evidence of brain bleed. °Please be very careful with walking, and do everything possible to prevent falls. ° ° °

## 2019-09-01 NOTE — Telephone Encounter (Signed)
I called and LMVM for pt [name] re: message.  I did LMVM for her, (with receommendations).  I asked for a call back to confirm received.  I sent also message in mail for her as well.

## 2019-09-01 NOTE — ED Provider Notes (Signed)
[place] [facility] DEPT Provider Note   CSN: [id number] Arrival date & time: 09/01/19  1212     History Chief Complaint  Patient presents with  . Hip Pain  . Fall    [name] is a 86 y.o. female.  HPI     86 year old female comes in a chief complaint of fall and hip pain. Level 5 caveat for dementia.  Called the emergency contact, which appears to be patient's granddaughter, and there was no response. Patient reports that she had a mechanical fall while trying to help her grand child.  She was unable to bear weight and was complaining of pain in her hips, therefore EMS was called.  Patient is unsure if she struck her head.  She is not having severe headaches.  She is not on any blood thinners.  Past Medical History:  Diagnosis Date  . Actinic keratosis   . Allergic rhinitis   . Aneurysm ([place])   . Arthritis    osteoarthritis, back, hands, wrists  . Basal cell carcinoma   . Bursitis    hips bilat   . CAD (coronary artery disease), autologous vein bypass graft March 2008   Follow-up cath: December 2015 Occluded SVG-D1 and occluded SVG-RI.  [name] CAD in native artery 2005, 03/2014   a.  Severe disease noted in LAD & RI --> referred for CABG x3; b.  Follow-up cath for abnormal Myoview: Occluded SVG-RI and SVG-D1 with patent LIMA-LAD and competitive flow.  60-70% LAD and RI lesions.  Otherwise minimal disease.  EF 70%.  . Cancer ([place])    + basal cell- on leg  . Cataract   . Dementia ([place])   . Dyslipidemia    Statin intolerant  . Glaucoma   . History of kidney stones   . Hypertension   . Non-STEMI (non-ST elevated myocardial infarction) [facility]) October 2005   EF by 35-40%, echo 40-50%. Angiography: 99% mid LAD involving D1 followed by 70% mid LAD; 80% RI. --> CABG  . Osteoarthritis   . PONV (postoperative nausea and vomiting)   . S/P CABG x 01 February 2004   LIMA-LAD, SVG-D1, SVG-RI.  [name] Statin intolerance   . Stroke [facility])     Patient Active  Problem List   Diagnosis Date Noted  . Frequent PVCs 08/24/2019  . Dementia without behavioral disturbance ([place]) 08/24/2019  . History of recent fall 08/24/2019  . Hip pain 07/15/2019  . Memory loss 07/09/2018  . Vitamin B12 deficiency 07/09/2018  . Hypertensive urgency 10/10/2016  . Hypokalemia 10/10/2016  . Migraine equivalent   . Aneurysm, cerebral, nonruptured   . TIA (transient ischemic attack) 04/05/2016  . Cerebellar infarct ([employer]) 02/16/2016  . Stroke (cerebrum) ([place]) 01/21/2016  . Neurological deficit present 01/21/2016  . Greater trochanteric bursitis of right hip 06/15/2015  . Hyperlipidemia LDL goal <70 05/14/2013  . Statin intolerance   . Essential hypertension   . CAD (coronary artery disease), autologous vein bypass graft 06/30/2006  . S/P CABG x 3 01/30/2004  . Non-STEMI (non-ST elevated myocardial infarction) ([place]) 01/30/2004    Past Surgical History:  Procedure Laterality Date  . BUNIONECTOMY     2002  . CARDIOVASCULAR STRESS TEST  05/23/2006   Mild lateral/inferolateral ischemia, likely due to occluded SVGs with exhisting disease.  [name] CAROTID DOPPLER  07/15/2009   Bilat ICAs 0-49% diameter reduction. Normal patency of Bilat subclavian arteries.  . CORONARY ARTERY BYPASS GRAFT  02/22/2004   x3. LIMA to distal LAD, SVG to  first diag, SVG to ramus. SVG harvest from rt thigh.  . EP IMPLANTABLE DEVICE N/A 03/02/2016   Procedure: Loop Recorder Insertion;  Surgeon: Thompson Grayer, MD;  Location: [place] CV LAB;  Service: Cardiovascular;  Laterality: N/A;  . EXCISION/RELEASE BURSA HIP Right 06/15/2015   Procedure: RIGHT HIP BURSECTOMY WITH GLUTEAL TENDON REPAIR;  Surgeon: Gaynelle Arabian, MD;  Location: [facility];  Service: Orthopedics;  Laterality: Right;  . EYE SURGERY     cataract surgery bilat   . IR ANGIO INTRA EXTRACRAN SEL COM CAROTID INNOMINATE BILAT MOD SED  08/07/2016  . IR ANGIO VERTEBRAL SEL SUBCLAVIAN INNOMINATE BILAT MOD SED  08/07/2016  . IR GENERIC HISTORICAL   06/01/2016   IR RADIOLOGIST EVAL & MGMT 06/01/2016 [facility]  . IR RADIOLOGIST EVAL & MGMT  05/07/2017  . LEFT HEART CATHETERIZATION WITH CORONARY ANGIOGRAM  02/19/2004   Significant 2 vessel CAD - LAD, D1 and RI  . LEFT HEART CATHETERIZATION WITH CORONARY/GRAFT ANGIOGRAM N/A 04/28/2014   Procedure: LEFT HEART CATHETERIZATION WITH Beatrix Fetters;  Surgeon: Sinclair Grooms, MD;  Location: [facility] CATH LAB: CTO of SVG-Diag & SVG-RI, patent LIMA-LAD. Patent native circumflex, LAD and RCA. 70% stenosis in a branch of RI. --> Does not explain "high risk perfusion study "  . LEFT HEART CATHETERIZATION WITH CORONARY/GRAFT ANGIOGRAM   07/25/2006   Totally occluded SVG to diag and ramus. Patent LIMA-LAD. Ramus 70-80% proximal stenosis and occluded vein graft.  [name] left total knee replacement      2001  . NM MYOVIEW LTD  January 2008; December 2015   a. Referred for For mild inferolateral and anterolateral/apical lateral defect with mild reversibility.;; b. Large defect in the anterior and inferior wall. Suggestive of potential infarct plus ischemia. HIGH RISK.  [name] RADIOLOGY WITH ANESTHESIA N/A 08/07/2016   Procedure: EMBOLIZATION;  Surgeon: Luanne Bras, MD;  Location: [facility];  Service: Radiology;  Laterality: N/A;  . right total knee replacement      2003  . TRANSTHORACIC ECHOCARDIOGRAM  02/19/2004; December 2015   a. EF 45-50%, Normal LV function, moderate hypokinesis of anterior wall.;; b. EF 50-55%. No RWMA, GR 1 DD. Normal valves      OB History   No obstetric history on file.     Family History  Problem Relation Age of Onset  . Alzheimer's disease Mother   . Alzheimer's disease Sister   . Hyperlipidemia Sister   . Hypertension Sister   . Diabetes Sister   . Hyperlipidemia Sister   . Hypertension Sister   . Pancreatitis Child     Social History   Tobacco Use  . Smoking status: Never Smoker  . Smokeless tobacco: Never Used  Substance Use Topics  . Alcohol use: Not Currently      Alcohol/week: 1.0 standard drinks    Types: 1 Standard drinks or equivalent per week    Comment: 1 glass of liquor weekly   . Drug use: No    Home Medications Prior to Admission medications   Medication Sig Start Date End Date Taking? Authorizing Provider  aspirin EC 81 MG tablet Take 81 mg by mouth at bedtime.    Yes [provider]  atenolol (TENORMIN) 25 MG tablet Take 1 tablet (25 mg total) daily by mouth. May take an additional 25 mg of medication daily, if SBP> 160 or DBP >100 Patient taking differently: Take 25 mg by mouth at bedtime. May take an additional 25 mg of medication daily, if SBP> 160 or  DBP >100 03/14/17  Yes Leonie Man, MD  brimonidine (ALPHAGAN P) 0.1 % SOLN Place 1 drop into the left eye in the morning and at bedtime.   Yes [provider]  Cholecalciferol (VITAMIN D3) 50 MCG (2000 UT) capsule Take 1 capsule (2,000 Units total) by mouth at bedtime. 04/09/19  Yes Leonie Man, MD  clopidogrel (PLAVIX) 75 MG tablet Take 1 tablet (75 mg total) by mouth daily. 05/26/19  Yes Leonie Man, MD  dorzolamide-timolol (COSOPT) 22.3-6.8 MG/ML ophthalmic solution Place 1 drop into the left eye 2 (two) times daily.  11/14/17  Yes [provider]  latanoprost (XALATAN) 0.005 % ophthalmic solution Place 1 drop into both eyes at bedtime. 06/26/16  Yes [provider]  lisinopril (PRINIVIL,ZESTRIL) 20 MG tablet Take 20 mg by mouth at bedtime.    Yes [provider]  donepezil (ARICEPT) 10 MG tablet Take 1 tablet (10 mg total) by mouth at bedtime. Patient not taking: Reported on 09/01/2019 08/21/19   Lomax, Amy, NP  memantine (NAMENDA) 5 MG tablet Take 1 tablet (5 mg total) by mouth 2 (two) times daily. 08/21/19   Lomax, Amy, NP    Allergies    Nsaids, Statins, Tricor [fenofibrate], Zetia [ezetimibe], Lescol [fluvastatin], Livalo [pitavastatin], Niaspan [niacin], and Relafen [nabumetone]  Review of Systems   Review of Systems   Constitutional: Positive for activity change.  Respiratory: Negative for shortness of breath.   Cardiovascular: Negative for chest pain.  Gastrointestinal: Negative for nausea and vomiting.  Allergic/Immunologic: Negative for immunocompromised state.  Hematological: Does not bruise/bleed easily.  All other systems reviewed and are negative.   Physical Exam Updated Vital Signs BP 122/75 (BP Location: Left Arm)   Pulse 74   Temp 98.4 F (36.9 C) (Oral)   Resp 18   SpO2 99%   Physical Exam Vitals and nursing note reviewed.  Constitutional:      Appearance: She is well-developed.  HENT:     Head: Normocephalic and atraumatic.  Cardiovascular:     Rate and Rhythm: Normal rate.  Pulmonary:     Effort: Pulmonary effort is normal.  Abdominal:     General: Bowel sounds are normal.  Musculoskeletal:     Cervical back: Normal range of motion and neck supple.     Comments: Head to toe evaluation shows no hematoma, bleeding of the scalp, no facial abrasions, no spine step offs, crepitus of the chest or neck, no tenderness to palpation of the bilateral upper and lower extremities, no gross deformities, no chest tenderness, no pelvic pain.   Skin:    General: Skin is warm and dry.  Neurological:     Mental Status: She is alert and oriented to person, place, and time.     ED Results / Procedures / Treatments   Labs (all labs ordered are listed, but only abnormal results are displayed) Labs Reviewed - No data to display  EKG None  Radiology DG Lumbar Spine Complete  Result Date: 09/01/2019 CLINICAL DATA:  Hip pain after fall. EXAM: LUMBAR SPINE - COMPLETE 4+ VIEW COMPARISON:  July 14, 2019. FINDINGS: Mild grade 1 retrolisthesis of L1-2 and L2-3 is noted secondary to moderate degenerative disc disease at these levels. Mild grade 1 anterolisthesis of L4-5 is noted secondary to posterior facet joint hypertrophy. Severe degenerative disc disease is noted at L5-S1. No fracture is  noted. Atherosclerosis of abdominal aorta is noted. IMPRESSION: Multilevel degenerative disc disease. No acute abnormality seen in the lumbar spine. Aortic  Atherosclerosis (ICD10-I70.0). Electronically Signed   By: Marijo Conception M.D.   On: 09/01/2019 13:34   CT Head Wo Contrast  Result Date: 09/01/2019 CLINICAL DATA:  Unwitnessed fall EXAM: CT HEAD WITHOUT CONTRAST TECHNIQUE: Contiguous axial images were obtained from the base of the skull through the vertex without intravenous contrast. COMPARISON:  07/14/2019 FINDINGS: Brain: No evidence of acute infarction, hemorrhage, hydrocephalus, extra-axial collection or mass lesion/mass effect. Extensive low-density changes within the periventricular and subcortical white matter compatible with chronic microvascular ischemic change. Mild-moderate diffuse cerebral volume loss. Vascular: Atherosclerotic calcifications involving the large vessels of the skull base. No unexpected hyperdense vessel. Skull: Normal. Negative for fracture or focal lesion. Sinuses/Orbits: No acute finding. Other: None. IMPRESSION: 1.  No acute intracranial findings. 2. Chronic microvascular ischemic change and cerebral volume loss, stable from prior. Electronically Signed   By: Davina Poke D.O.   On: 09/01/2019 14:15   DG Hips Bilat W or Wo Pelvis 3-4 Views  Result Date: 09/01/2019 CLINICAL DATA:  Status post fall, left hip pain EXAM: DG HIP (WITH OR WITHOUT PELVIS) 3-4V BILAT COMPARISON:  None. FINDINGS: Generalized osteopenia. No acute fracture or dislocation. 2 bone anchors are present in the right greater trochanter likely from gluteus tendon repair. Mild joint space narrowing of bilateral hips consistent with mild osteoarthritis. No aggressive osseous lesion. Lower lumbar spine spondylosis. Mild osteoarthritis of bilateral SI joints. Degenerative changes of the pubic symphysis. IMPRESSION: No acute osseous injury of bilateral hips. Given the patient's age and osteopenia, if there  is persistent clinical concern for an occult hip fracture, a MRI of the hip is recommended for increased sensitivity. Electronically Signed   By: Kathreen Devoid   On: 09/01/2019 13:33    Procedures Procedures (including critical care time)  Medications Ordered in ED Medications - No data to display  ED Course  I have reviewed the triage vital signs and the nursing notes.  Pertinent labs & imaging results that were available during my care of the patient were reviewed by me and considered in my medical decision making (see chart for details).    MDM Rules/Calculators/A&P                      86 year old comes in a chief complaint of fall.  DDx includes: - Mechanical falls - ICH - Fractures - Contusions - Soft tissue injury  On exam patient has mild hip tenderness bilaterally. Appropriate imaging ordered.  Once the x-rays are completed, the nurse tech and I were able to stand patient up and she was able to take a few steps/bear weight.  I called patient's granddaughter again at the completion of the work-up -VM was left.  Final Clinical Impression(s) / ED Diagnoses Final diagnoses:  Fall, initial encounter  Contusion of hip, unspecified laterality, initial encounter    Rx / DC Orders ED Discharge Orders    None       Varney Biles, MD 09/01/19 1610

## 2019-09-02 ENCOUNTER — Telehealth: Payer: Self-pay | Admitting: Family Medicine

## 2019-09-02 NOTE — Telephone Encounter (Signed)
Called and spoke to patient and relayed [facility] would not be able to start her home health until the week of May 10 th . Patient was fine with this and she understood.

## 2019-09-05 DIAGNOSIS — R399 Unspecified symptoms and signs involving the genitourinary system: Secondary | ICD-10-CM | POA: Diagnosis not present

## 2019-09-08 DIAGNOSIS — R2689 Other abnormalities of gait and mobility: Secondary | ICD-10-CM | POA: Diagnosis not present

## 2019-09-08 DIAGNOSIS — R630 Anorexia: Secondary | ICD-10-CM | POA: Diagnosis not present

## 2019-09-08 DIAGNOSIS — Z9181 History of falling: Secondary | ICD-10-CM | POA: Diagnosis not present

## 2019-09-08 DIAGNOSIS — F0390 Unspecified dementia without behavioral disturbance: Secondary | ICD-10-CM | POA: Diagnosis not present

## 2019-09-08 DIAGNOSIS — I2510 Atherosclerotic heart disease of native coronary artery without angina pectoris: Secondary | ICD-10-CM | POA: Diagnosis not present

## 2019-09-11 DIAGNOSIS — R269 Unspecified abnormalities of gait and mobility: Secondary | ICD-10-CM | POA: Diagnosis not present

## 2019-09-11 DIAGNOSIS — F0390 Unspecified dementia without behavioral disturbance: Secondary | ICD-10-CM | POA: Diagnosis not present

## 2019-09-11 DIAGNOSIS — R531 Weakness: Secondary | ICD-10-CM | POA: Diagnosis not present

## 2019-09-11 DIAGNOSIS — Z9181 History of falling: Secondary | ICD-10-CM | POA: Diagnosis not present

## 2019-09-11 DIAGNOSIS — M6281 Muscle weakness (generalized): Secondary | ICD-10-CM | POA: Diagnosis not present

## 2019-09-11 DIAGNOSIS — I10 Essential (primary) hypertension: Secondary | ICD-10-CM | POA: Diagnosis not present

## 2019-09-15 DIAGNOSIS — Z961 Presence of intraocular lens: Secondary | ICD-10-CM | POA: Diagnosis not present

## 2019-09-15 DIAGNOSIS — Z9889 Other specified postprocedural states: Secondary | ICD-10-CM | POA: Diagnosis not present

## 2019-09-15 DIAGNOSIS — H0102A Squamous blepharitis right eye, upper and lower eyelids: Secondary | ICD-10-CM | POA: Diagnosis not present

## 2019-09-15 DIAGNOSIS — H401133 Primary open-angle glaucoma, bilateral, severe stage: Secondary | ICD-10-CM | POA: Diagnosis not present

## 2019-09-15 DIAGNOSIS — H0102B Squamous blepharitis left eye, upper and lower eyelids: Secondary | ICD-10-CM | POA: Diagnosis not present

## 2019-09-15 DIAGNOSIS — H43813 Vitreous degeneration, bilateral: Secondary | ICD-10-CM | POA: Diagnosis not present

## 2019-09-19 ENCOUNTER — Emergency Department (HOSPITAL_COMMUNITY): Payer: Medicare Other

## 2019-09-19 ENCOUNTER — Other Ambulatory Visit: Payer: Self-pay

## 2019-09-19 ENCOUNTER — Encounter (HOSPITAL_COMMUNITY): Payer: Self-pay

## 2019-09-19 ENCOUNTER — Inpatient Hospital Stay (HOSPITAL_COMMUNITY)
Admission: EM | Admit: 2019-09-19 | Discharge: 2019-10-02 | DRG: 871 | Disposition: A | Discharge status: Home Health | Payer: Medicare Other | Attending: Family Medicine | Admitting: Family Medicine

## 2019-09-19 DIAGNOSIS — Z820 Family history of epilepsy and other diseases of the nervous system: Secondary | ICD-10-CM

## 2019-09-19 DIAGNOSIS — L8990 Pressure ulcer of unspecified site, unspecified stage: Secondary | ICD-10-CM | POA: Insufficient documentation

## 2019-09-19 DIAGNOSIS — R6520 Severe sepsis without septic shock: Secondary | ICD-10-CM | POA: Diagnosis not present

## 2019-09-19 DIAGNOSIS — R52 Pain, unspecified: Secondary | ICD-10-CM | POA: Diagnosis not present

## 2019-09-19 DIAGNOSIS — Z8249 Family history of ischemic heart disease and other diseases of the circulatory system: Secondary | ICD-10-CM

## 2019-09-19 DIAGNOSIS — A411 Sepsis due to other specified staphylococcus: Secondary | ICD-10-CM | POA: Diagnosis not present

## 2019-09-19 DIAGNOSIS — R1084 Generalized abdominal pain: Secondary | ICD-10-CM | POA: Diagnosis not present

## 2019-09-19 DIAGNOSIS — Z515 Encounter for palliative care: Secondary | ICD-10-CM

## 2019-09-19 DIAGNOSIS — Z79899 Other long term (current) drug therapy: Secondary | ICD-10-CM

## 2019-09-19 DIAGNOSIS — K529 Noninfective gastroenteritis and colitis, unspecified: Secondary | ICD-10-CM | POA: Diagnosis not present

## 2019-09-19 DIAGNOSIS — A09 Infectious gastroenteritis and colitis, unspecified: Secondary | ICD-10-CM | POA: Diagnosis present

## 2019-09-19 DIAGNOSIS — L89152 Pressure ulcer of sacral region, stage 2: Secondary | ICD-10-CM | POA: Diagnosis present

## 2019-09-19 DIAGNOSIS — E871 Hypo-osmolality and hyponatremia: Secondary | ICD-10-CM | POA: Diagnosis present

## 2019-09-19 DIAGNOSIS — M1990 Unspecified osteoarthritis, unspecified site: Secondary | ICD-10-CM | POA: Diagnosis present

## 2019-09-19 DIAGNOSIS — Z20822 Contact with and (suspected) exposure to covid-19: Secondary | ICD-10-CM | POA: Diagnosis not present

## 2019-09-19 DIAGNOSIS — R413 Other amnesia: Secondary | ICD-10-CM | POA: Diagnosis present

## 2019-09-19 DIAGNOSIS — Z8673 Personal history of transient ischemic attack (TIA), and cerebral infarction without residual deficits: Secondary | ICD-10-CM

## 2019-09-19 DIAGNOSIS — Z66 Do not resuscitate: Secondary | ICD-10-CM | POA: Diagnosis not present

## 2019-09-19 DIAGNOSIS — I2510 Atherosclerotic heart disease of native coronary artery without angina pectoris: Secondary | ICD-10-CM | POA: Diagnosis present

## 2019-09-19 DIAGNOSIS — Z7189 Other specified counseling: Secondary | ICD-10-CM

## 2019-09-19 DIAGNOSIS — A419 Sepsis, unspecified organism: Secondary | ICD-10-CM | POA: Diagnosis present

## 2019-09-19 DIAGNOSIS — E876 Hypokalemia: Secondary | ICD-10-CM | POA: Diagnosis present

## 2019-09-19 DIAGNOSIS — N179 Acute kidney failure, unspecified: Secondary | ICD-10-CM | POA: Diagnosis not present

## 2019-09-19 DIAGNOSIS — R627 Adult failure to thrive: Secondary | ICD-10-CM | POA: Diagnosis present

## 2019-09-19 DIAGNOSIS — I959 Hypotension, unspecified: Secondary | ICD-10-CM | POA: Diagnosis present

## 2019-09-19 DIAGNOSIS — I10 Essential (primary) hypertension: Secondary | ICD-10-CM | POA: Diagnosis not present

## 2019-09-19 DIAGNOSIS — R918 Other nonspecific abnormal finding of lung field: Secondary | ICD-10-CM | POA: Diagnosis not present

## 2019-09-19 DIAGNOSIS — G9341 Metabolic encephalopathy: Secondary | ICD-10-CM | POA: Diagnosis present

## 2019-09-19 DIAGNOSIS — R1310 Dysphagia, unspecified: Secondary | ICD-10-CM

## 2019-09-19 DIAGNOSIS — Z83438 Family history of other disorder of lipoprotein metabolism and other lipidemia: Secondary | ICD-10-CM

## 2019-09-19 DIAGNOSIS — I252 Old myocardial infarction: Secondary | ICD-10-CM

## 2019-09-19 DIAGNOSIS — Z833 Family history of diabetes mellitus: Secondary | ICD-10-CM

## 2019-09-19 DIAGNOSIS — I25810 Atherosclerosis of coronary artery bypass graft(s) without angina pectoris: Secondary | ICD-10-CM | POA: Diagnosis present

## 2019-09-19 DIAGNOSIS — Z87442 Personal history of urinary calculi: Secondary | ICD-10-CM

## 2019-09-19 DIAGNOSIS — F05 Delirium due to known physiological condition: Secondary | ICD-10-CM | POA: Diagnosis present

## 2019-09-19 DIAGNOSIS — F0150 Vascular dementia without behavioral disturbance: Secondary | ICD-10-CM | POA: Diagnosis present

## 2019-09-19 DIAGNOSIS — Z85828 Personal history of other malignant neoplasm of skin: Secondary | ICD-10-CM

## 2019-09-19 DIAGNOSIS — R112 Nausea with vomiting, unspecified: Secondary | ICD-10-CM | POA: Diagnosis not present

## 2019-09-19 DIAGNOSIS — E860 Dehydration: Secondary | ICD-10-CM | POA: Diagnosis present

## 2019-09-19 DIAGNOSIS — R0902 Hypoxemia: Secondary | ICD-10-CM | POA: Diagnosis not present

## 2019-09-19 DIAGNOSIS — Z7982 Long term (current) use of aspirin: Secondary | ICD-10-CM

## 2019-09-19 DIAGNOSIS — E785 Hyperlipidemia, unspecified: Secondary | ICD-10-CM | POA: Diagnosis present

## 2019-09-19 DIAGNOSIS — F0390 Unspecified dementia without behavioral disturbance: Secondary | ICD-10-CM | POA: Diagnosis present

## 2019-09-19 DIAGNOSIS — K8020 Calculus of gallbladder without cholecystitis without obstruction: Secondary | ICD-10-CM | POA: Diagnosis not present

## 2019-09-19 DIAGNOSIS — Z7902 Long term (current) use of antithrombotics/antiplatelets: Secondary | ICD-10-CM

## 2019-09-19 LAB — URINALYSIS, ROUTINE W REFLEX MICROSCOPIC
Bilirubin Urine: NEGATIVE
Glucose, UA: NEGATIVE mg/dL
Hgb urine dipstick: NEGATIVE
Ketones, ur: NEGATIVE mg/dL
Leukocytes,Ua: NEGATIVE
Nitrite: NEGATIVE
Protein, ur: NEGATIVE mg/dL
Specific Gravity, Urine: 1.016 (ref 1.005–1.030)
pH: 5 (ref 5.0–8.0)

## 2019-09-19 LAB — COMPREHENSIVE METABOLIC PANEL
ALT: 26 U/L (ref 0–44)
AST: 44 U/L — ABNORMAL HIGH (ref 15–41)
Albumin: 3.1 g/dL — ABNORMAL LOW (ref 3.5–5.0)
Alkaline Phosphatase: 145 U/L — ABNORMAL HIGH (ref 38–126)
Anion gap: 15 (ref 5–15)
BUN: 38 mg/dL — ABNORMAL HIGH (ref 8–23)
CO2: 22 mmol/L (ref 22–32)
Calcium: 9 mg/dL (ref 8.9–10.3)
Chloride: 105 mmol/L (ref 98–111)
Creatinine, Ser: 2.35 mg/dL — ABNORMAL HIGH (ref 0.44–1.00)
GFR calc Af Amer: 21 mL/min — ABNORMAL LOW (ref >60)
GFR calc non Af Amer: 18 mL/min — ABNORMAL LOW (ref >60)
Glucose, Bld: 158 mg/dL — ABNORMAL HIGH (ref 70–99)
Potassium: 4.4 mmol/L (ref 3.5–5.1)
Sodium: 142 mmol/L (ref 135–145)
Total Bilirubin: 1.1 mg/dL (ref 0.3–1.2)
Total Protein: 6.4 g/dL — ABNORMAL LOW (ref 6.5–8.1)

## 2019-09-19 LAB — CBC
HCT: 40.1 % (ref 36.0–46.0)
Hemoglobin: 13.1 g/dL (ref 12.0–15.0)
MCH: 31.2 pg (ref 26.0–34.0)
MCHC: 32.7 g/dL (ref 30.0–36.0)
MCV: 95.5 fL (ref 80.0–100.0)
Platelets: 512 10*3/uL — ABNORMAL HIGH (ref 150–400)
RBC: 4.2 MIL/uL (ref 3.87–5.11)
RDW: 12.7 % (ref 11.5–15.5)
WBC: 23.8 10*3/uL — ABNORMAL HIGH (ref 4.0–10.5)
nRBC: 0 % (ref 0.0–0.2)

## 2019-09-19 LAB — LIPASE, BLOOD: Lipase: 16 U/L (ref 11–51)

## 2019-09-19 LAB — LACTIC ACID, PLASMA: Lactic Acid, Venous: 2.1 mmol/L (ref 0.5–1.9)

## 2019-09-19 MED ORDER — SODIUM CHLORIDE 0.9 % IV SOLN
2.0000 g | Freq: Once | INTRAVENOUS | Status: AC
  Administered 2019-09-19: 2 g via INTRAVENOUS
  Filled 2019-09-19: qty 2

## 2019-09-19 MED ORDER — VANCOMYCIN HCL IN DEXTROSE 1-5 GM/200ML-% IV SOLN
1000.0000 mg | Freq: Once | INTRAVENOUS | Status: AC
  Administered 2019-09-20: 1000 mg via INTRAVENOUS
  Filled 2019-09-19: qty 200

## 2019-09-19 MED ORDER — METRONIDAZOLE IN NACL 5-0.79 MG/ML-% IV SOLN
500.0000 mg | Freq: Once | INTRAVENOUS | Status: AC
  Administered 2019-09-20: 500 mg via INTRAVENOUS
  Filled 2019-09-19: qty 100

## 2019-09-19 MED ORDER — SODIUM CHLORIDE 0.9 % IV SOLN: Freq: Once | INTRAVENOUS | Status: AC

## 2019-09-19 MED ORDER — SODIUM CHLORIDE 0.9% FLUSH
3.0000 mL | Freq: Once | INTRAVENOUS | Status: AC
  Administered 2019-09-19: 3 mL via INTRAVENOUS

## 2019-09-19 NOTE — Progress Notes (Signed)
A consult was received from an ED physician for Vancomycin, cefepime per pharmacy dosing.  The patient's profile has been reviewed for ht/wt/allergies/indication/available labs.   A one time order has been placed for Vancomycin 1gm iv x1, and Cefepime 2gm iv x1.  Further antibiotics/pharmacy consults should be ordered by admitting physician if indicated.                       Thank you, Nani Skillern Crowford 09/19/2019  10:40 PM

## 2019-09-19 NOTE — ED Notes (Signed)
Upon changing pts brief, blood streak discharge found. Pt has small sacral pressure injury. Nehemiah Settle PA made aware.

## 2019-09-19 NOTE — ED Provider Notes (Signed)
[facility] DEPT Provider Note   CSN: [id number] Arrival date & time: 09/19/19  2026     History Chief Complaint  Patient presents with  . Diarrhea  . Emesis    [name] is a 86 y.o. female.  Patient with history of dementia, HTN, HLD, CAD, CVA, aneurysm, arthritis presents from home where she lives with granddaughter, [name], who is available by phone and who provides history. She started having vomiting and diarrhea yesterday morning. Today symptoms continue. No fever. She has not eaten and had anything to drink today. Per [name], the patient complained of rectal soreness. She has moderate dementia but is usually awake, alert, walks with assistance, eats well. Today she is less awake, barely opens her eyes when talked to. No bloody stools or emesis. No respiratory symptoms.   The history is provided by the EMS personnel and a relative. No language interpreter was used.  Diarrhea Associated symptoms: vomiting   Emesis Associated symptoms: diarrhea        Past Medical History:  Diagnosis Date  . Actinic keratosis   . Allergic rhinitis   . Aneurysm ([place])   . Arthritis    osteoarthritis, back, hands, wrists  . Basal cell carcinoma   . Bursitis    hips bilat   . CAD (coronary artery disease), autologous vein bypass graft March 2008   Follow-up cath: December 2015 Occluded SVG-D1 and occluded SVG-RI.  [name] CAD in native artery 2005, 03/2014   a.  Severe disease noted in LAD & RI --> referred for CABG x3; b.  Follow-up cath for abnormal Myoview: Occluded SVG-RI and SVG-D1 with patent LIMA-LAD and competitive flow.  60-70% LAD and RI lesions.  Otherwise minimal disease.  EF 70%.  . Cancer ([place])    + basal cell- on leg  . Cataract   . Dementia ([place])   . Dyslipidemia    Statin intolerant  . Glaucoma   . History of kidney stones   . Hypertension   . Non-STEMI (non-ST elevated myocardial infarction) [facility]) October 2005   EF by 35-40%,  echo 40-50%. Angiography: 99% mid LAD involving D1 followed by 70% mid LAD; 80% RI. --> CABG  . Osteoarthritis   . PONV (postoperative nausea and vomiting)   . S/P CABG x 01 February 2004   LIMA-LAD, SVG-D1, SVG-RI.  [name] Statin intolerance   . Stroke [facility])     Patient Active Problem List   Diagnosis Date Noted  . Frequent PVCs 08/24/2019  . Dementia without behavioral disturbance ([place]) 08/24/2019  . History of recent fall 08/24/2019  . Hip pain 07/15/2019  . Memory loss 07/09/2018  . Vitamin B12 deficiency 07/09/2018  . Hypertensive urgency 10/10/2016  . Hypokalemia 10/10/2016  . Migraine equivalent   . Aneurysm, cerebral, nonruptured   . TIA (transient ischemic attack) 04/05/2016  . Cerebellar infarct ([place]) 02/16/2016  . Stroke (cerebrum) ([place]) 01/21/2016  . Neurological deficit present 01/21/2016  . Greater trochanteric bursitis of right hip 06/15/2015  . Hyperlipidemia LDL goal <70 05/14/2013  . Statin intolerance   . Essential hypertension   . CAD (coronary artery disease), autologous vein bypass graft 06/30/2006  . S/P CABG x 3 01/30/2004  . Non-STEMI (non-ST elevated myocardial infarction) ([place]) 01/30/2004    Past Surgical History:  Procedure Laterality Date  . BUNIONECTOMY     2002  . CARDIOVASCULAR STRESS TEST  05/23/2006   Mild lateral/inferolateral ischemia, likely due to occluded SVGs with exhisting disease.  [name] CAROTID DOPPLER  07/15/2009   Bilat ICAs 0-49% diameter reduction. Normal patency of Bilat subclavian arteries.  . CORONARY ARTERY BYPASS GRAFT  02/22/2004   x3. LIMA to distal LAD, SVG to first diag, SVG to ramus. SVG harvest from rt thigh.  . EP IMPLANTABLE DEVICE N/A 03/02/2016   Procedure: Loop Recorder Insertion;  Surgeon: Thompson Grayer, MD;  Location: [facility];  Service: Cardiovascular;  Laterality: N/A;  . EXCISION/RELEASE BURSA HIP Right 06/15/2015   Procedure: RIGHT HIP BURSECTOMY WITH GLUTEAL TENDON REPAIR;  Surgeon: Gaynelle Arabian, MD;   Location: [facility];  Service: Orthopedics;  Laterality: Right;  . EYE SURGERY     cataract surgery bilat   . IR ANGIO INTRA EXTRACRAN SEL COM CAROTID INNOMINATE BILAT MOD SED  08/07/2016  . IR ANGIO VERTEBRAL SEL SUBCLAVIAN INNOMINATE BILAT MOD SED  08/07/2016  . IR GENERIC HISTORICAL  06/01/2016   IR RADIOLOGIST EVAL & MGMT 06/01/2016 [facility]  . IR RADIOLOGIST EVAL & MGMT  05/07/2017  . LEFT HEART CATHETERIZATION WITH CORONARY ANGIOGRAM  02/19/2004   Significant 2 vessel CAD - LAD, D1 and RI  . LEFT HEART CATHETERIZATION WITH CORONARY/GRAFT ANGIOGRAM N/A 04/28/2014   Procedure: LEFT HEART CATHETERIZATION WITH Beatrix Fetters;  Surgeon: Sinclair Grooms, MD;  Location: [facility] CATH LAB: CTO of SVG-Diag & SVG-RI, patent LIMA-LAD. Patent native circumflex, LAD and RCA. 70% stenosis in a branch of RI. --> Does not explain "high risk perfusion study "  . LEFT HEART CATHETERIZATION WITH CORONARY/GRAFT ANGIOGRAM   07/25/2006   Totally occluded SVG to diag and ramus. Patent LIMA-LAD. Ramus 70-80% proximal stenosis and occluded vein graft.  [name] left total knee replacement      2001  . NM MYOVIEW LTD  January 2008; December 2015   a. Referred for For mild inferolateral and anterolateral/apical lateral defect with mild reversibility.;; b. Large defect in the anterior and inferior wall. Suggestive of potential infarct plus ischemia. HIGH RISK.  [name] RADIOLOGY WITH ANESTHESIA N/A 08/07/2016   Procedure: EMBOLIZATION;  Surgeon: Luanne Bras, MD;  Location: [facility];  Service: Radiology;  Laterality: N/A;  . right total knee replacement      2003  . TRANSTHORACIC ECHOCARDIOGRAM  02/19/2004; December 2015   a. EF 45-50%, Normal LV function, moderate hypokinesis of anterior wall.;; b. EF 50-55%. No RWMA, GR 1 DD. Normal valves      OB History   No obstetric history on file.     Family History  Problem Relation Age of Onset  . Alzheimer's disease Mother   . Alzheimer's disease Sister   . Hyperlipidemia  Sister   . Hypertension Sister   . Diabetes Sister   . Hyperlipidemia Sister   . Hypertension Sister   . Pancreatitis Child     Social History   Tobacco Use  . Smoking status: Never Smoker  . Smokeless tobacco: Never Used  Substance Use Topics  . Alcohol use: Not Currently    Alcohol/week: 1.0 standard drinks    Types: 1 Standard drinks or equivalent per week    Comment: 1 glass of liquor weekly   . Drug use: No    Home Medications Prior to Admission medications   Medication Sig Start Date End Date Taking? Authorizing Provider  aspirin EC 81 MG tablet Take 81 mg by mouth at bedtime.     [provider]  atenolol (TENORMIN) 25 MG tablet Take 1 tablet (25 mg total) daily by mouth. May take an additional 25 mg of medication  daily, if SBP> 160 or DBP >100 Patient taking differently: Take 25 mg by mouth at bedtime. May take an additional 25 mg of medication daily, if SBP> 160 or DBP >100 03/14/17   Leonie Man, MD  brimonidine (ALPHAGAN P) 0.1 % SOLN Place 1 drop into the left eye in the morning and at bedtime.    [provider]  Cholecalciferol (VITAMIN D3) 50 MCG (2000 UT) capsule Take 1 capsule (2,000 Units total) by mouth at bedtime. 04/09/19   Leonie Man, MD  clopidogrel (PLAVIX) 75 MG tablet Take 1 tablet (75 mg total) by mouth daily. 05/26/19   Leonie Man, MD  donepezil (ARICEPT) 10 MG tablet Take 1 tablet (10 mg total) by mouth at bedtime. Patient not taking: Reported on 09/01/2019 08/21/19   Lomax, Amy, NP  dorzolamide-timolol (COSOPT) 22.3-6.8 MG/ML ophthalmic solution Place 1 drop into the left eye 2 (two) times daily.  11/14/17   [provider]  latanoprost (XALATAN) 0.005 % ophthalmic solution Place 1 drop into both eyes at bedtime. 06/26/16   [provider]  lisinopril (PRINIVIL,ZESTRIL) 20 MG tablet Take 20 mg by mouth at bedtime.     [provider]  memantine (NAMENDA) 5 MG tablet Take 1 tablet (5 mg total) by  mouth 2 (two) times daily. 08/21/19   Lomax, Amy, NP    Allergies    Nsaids, Statins, Tricor [fenofibrate], Zetia [ezetimibe], Lescol [fluvastatin], Livalo [pitavastatin], Niaspan [niacin], and Relafen [nabumetone]  Review of Systems   Review of Systems  Unable to perform ROS: Mental status change  Gastrointestinal: Positive for diarrhea and vomiting.    Physical Exam Updated Vital Signs BP (!) 111/49   Pulse 73   Temp 98.5 F (36.9 C)   Resp 18   SpO2 98%   Physical Exam Vitals and nursing note reviewed.  HENT:     Head: Atraumatic.  Cardiovascular:     Rate and Rhythm: Normal rate and regular rhythm.     Heart sounds: No murmur.  Pulmonary:     Effort: Pulmonary effort is normal.     Breath sounds: No wheezing, rhonchi or rales.  Chest:     Chest wall: No tenderness.  Abdominal:     General: Abdomen is flat.     Palpations: Abdomen is soft.     Tenderness: There is abdominal tenderness (Patient grimaces with any palpation of abdomen). There is no guarding.  Musculoskeletal:     Cervical back: Normal range of motion and neck supple.  Skin:    General: Skin is warm and dry.  Neurological:     Comments: Patient responsive to voice, keeps eyes closed, does not follow command.      ED Results / Procedures / Treatments   Labs (all labs ordered are listed, but only abnormal results are displayed) Labs Reviewed  CBC - Abnormal; Notable for the following components:      Result Value   WBC 23.8 (*)    Platelets 512 (*)    All other components within normal limits  LIPASE, BLOOD  COMPREHENSIVE METABOLIC PANEL  URINALYSIS, ROUTINE W REFLEX MICROSCOPIC    EKG None  Radiology No results found.  Procedures Procedures (including critical care time)  Medications Ordered in ED Medications  sodium chloride flush (NS) 0.9 % injection 3 mL (3 mLs Intravenous Given 09/19/19 2123)    ED Course  I have reviewed the triage vital signs and the nursing  notes.  Pertinent labs & imaging results  that were available during my care of the patient were reviewed by me and considered in my medical decision making (see chart for details).    MDM Rules/Calculators/A&P                      Patient to ED with history per [name] as detailed in the HPI.  With her altered mental status, hypotension, WBC 23, code sepsis was initiated. IV antibiotics for unknown source started - cefepime, flagyl, vancomycin. Remainder of labs pending. CT abdomen ordered to evaluate tenderness on exam. Fluids infusing at 75 cc/hr. Will bolus per sepsis protocol if lactate significantly elevated.   CT findings c/w infectious vs inflammatory colitis, less likely ischemic, favor infectious given appearance of sepsis. Blood pressure 110/49. GI panel ordered.  Hospitalist paged for admission. Patient is accepted by Dr. Marice Potter.  Final Clinical Impression(s) / ED Diagnoses Final diagnoses:  None   1. Sepsis 2. Colitis   Rx / DC Orders ED Discharge Orders    None       Charlann Lange, PA-C 09/20/19 0258    Malvin Johns, MD 09/20/19 [phone]

## 2019-09-19 NOTE — ED Triage Notes (Signed)
Pt BIB [facility] from home for nausea, vomiting, diarrhea and decreased appetite since Thursday. Family reports that pt took Remeron Wednesday night prior to symptoms starting. Hypotensive (90/50 -> 118/42 with a 560mL bolus enroute). Dementia patient. Assists with ADLs, but only oriented to self at baseline.

## 2019-09-20 DIAGNOSIS — E869 Volume depletion, unspecified: Secondary | ICD-10-CM | POA: Diagnosis not present

## 2019-09-20 DIAGNOSIS — E871 Hypo-osmolality and hyponatremia: Secondary | ICD-10-CM | POA: Diagnosis present

## 2019-09-20 DIAGNOSIS — F0390 Unspecified dementia without behavioral disturbance: Secondary | ICD-10-CM | POA: Diagnosis present

## 2019-09-20 DIAGNOSIS — F0150 Vascular dementia without behavioral disturbance: Secondary | ICD-10-CM | POA: Diagnosis present

## 2019-09-20 DIAGNOSIS — R7881 Bacteremia: Secondary | ICD-10-CM | POA: Diagnosis not present

## 2019-09-20 DIAGNOSIS — A419 Sepsis, unspecified organism: Secondary | ICD-10-CM | POA: Diagnosis not present

## 2019-09-20 DIAGNOSIS — E860 Dehydration: Secondary | ICD-10-CM | POA: Diagnosis present

## 2019-09-20 DIAGNOSIS — I25710 Atherosclerosis of autologous vein coronary artery bypass graft(s) with unstable angina pectoris: Secondary | ICD-10-CM | POA: Diagnosis not present

## 2019-09-20 DIAGNOSIS — I10 Essential (primary) hypertension: Secondary | ICD-10-CM | POA: Diagnosis not present

## 2019-09-20 DIAGNOSIS — Z7902 Long term (current) use of antithrombotics/antiplatelets: Secondary | ICD-10-CM | POA: Diagnosis not present

## 2019-09-20 DIAGNOSIS — Z515 Encounter for palliative care: Secondary | ICD-10-CM | POA: Diagnosis not present

## 2019-09-20 DIAGNOSIS — Z7189 Other specified counseling: Secondary | ICD-10-CM | POA: Diagnosis not present

## 2019-09-20 DIAGNOSIS — I959 Hypotension, unspecified: Secondary | ICD-10-CM | POA: Diagnosis present

## 2019-09-20 DIAGNOSIS — A411 Sepsis due to other specified staphylococcus: Secondary | ICD-10-CM | POA: Diagnosis present

## 2019-09-20 DIAGNOSIS — R1310 Dysphagia, unspecified: Secondary | ICD-10-CM | POA: Diagnosis not present

## 2019-09-20 DIAGNOSIS — Z66 Do not resuscitate: Secondary | ICD-10-CM | POA: Diagnosis present

## 2019-09-20 DIAGNOSIS — K529 Noninfective gastroenteritis and colitis, unspecified: Secondary | ICD-10-CM | POA: Diagnosis not present

## 2019-09-20 DIAGNOSIS — I252 Old myocardial infarction: Secondary | ICD-10-CM | POA: Diagnosis not present

## 2019-09-20 DIAGNOSIS — R918 Other nonspecific abnormal finding of lung field: Secondary | ICD-10-CM | POA: Diagnosis not present

## 2019-09-20 DIAGNOSIS — Z820 Family history of epilepsy and other diseases of the nervous system: Secondary | ICD-10-CM | POA: Diagnosis not present

## 2019-09-20 DIAGNOSIS — G9340 Encephalopathy, unspecified: Secondary | ICD-10-CM | POA: Diagnosis not present

## 2019-09-20 DIAGNOSIS — L89152 Pressure ulcer of sacral region, stage 2: Secondary | ICD-10-CM | POA: Diagnosis present

## 2019-09-20 DIAGNOSIS — N179 Acute kidney failure, unspecified: Secondary | ICD-10-CM | POA: Diagnosis present

## 2019-09-20 DIAGNOSIS — E785 Hyperlipidemia, unspecified: Secondary | ICD-10-CM | POA: Diagnosis present

## 2019-09-20 DIAGNOSIS — R627 Adult failure to thrive: Secondary | ICD-10-CM | POA: Diagnosis present

## 2019-09-20 DIAGNOSIS — B957 Other staphylococcus as the cause of diseases classified elsewhere: Secondary | ICD-10-CM | POA: Diagnosis not present

## 2019-09-20 DIAGNOSIS — A09 Infectious gastroenteritis and colitis, unspecified: Secondary | ICD-10-CM | POA: Diagnosis present

## 2019-09-20 DIAGNOSIS — K8020 Calculus of gallbladder without cholecystitis without obstruction: Secondary | ICD-10-CM | POA: Diagnosis not present

## 2019-09-20 DIAGNOSIS — I25810 Atherosclerosis of coronary artery bypass graft(s) without angina pectoris: Secondary | ICD-10-CM | POA: Diagnosis not present

## 2019-09-20 DIAGNOSIS — E876 Hypokalemia: Secondary | ICD-10-CM | POA: Diagnosis present

## 2019-09-20 DIAGNOSIS — G9341 Metabolic encephalopathy: Secondary | ICD-10-CM | POA: Diagnosis present

## 2019-09-20 DIAGNOSIS — F05 Delirium due to known physiological condition: Secondary | ICD-10-CM | POA: Diagnosis present

## 2019-09-20 DIAGNOSIS — Z20822 Contact with and (suspected) exposure to covid-19: Secondary | ICD-10-CM | POA: Diagnosis present

## 2019-09-20 DIAGNOSIS — I2510 Atherosclerotic heart disease of native coronary artery without angina pectoris: Secondary | ICD-10-CM | POA: Diagnosis present

## 2019-09-20 LAB — PHOSPHORUS: Phosphorus: 5.9 mg/dL — ABNORMAL HIGH (ref 2.5–4.6)

## 2019-09-20 LAB — CBC
HCT: 37.1 % (ref 36.0–46.0)
Hemoglobin: 11.9 g/dL — ABNORMAL LOW (ref 12.0–15.0)
MCH: 31.2 pg (ref 26.0–34.0)
MCHC: 32.1 g/dL (ref 30.0–36.0)
MCV: 97.1 fL (ref 80.0–100.0)
Platelets: 423 10*3/uL — ABNORMAL HIGH (ref 150–400)
RBC: 3.82 MIL/uL — ABNORMAL LOW (ref 3.87–5.11)
RDW: 13 % (ref 11.5–15.5)
WBC: 19.2 10*3/uL — ABNORMAL HIGH (ref 4.0–10.5)
nRBC: 0 % (ref 0.0–0.2)

## 2019-09-20 LAB — COMPREHENSIVE METABOLIC PANEL
ALT: 24 U/L (ref 0–44)
AST: 40 U/L (ref 15–41)
Albumin: 2.8 g/dL — ABNORMAL LOW (ref 3.5–5.0)
Alkaline Phosphatase: 126 U/L (ref 38–126)
Anion gap: 11 (ref 5–15)
BUN: 44 mg/dL — ABNORMAL HIGH (ref 8–23)
CO2: 22 mmol/L (ref 22–32)
Calcium: 8.5 mg/dL — ABNORMAL LOW (ref 8.9–10.3)
Chloride: 108 mmol/L (ref 98–111)
Creatinine, Ser: 2.42 mg/dL — ABNORMAL HIGH (ref 0.44–1.00)
GFR calc Af Amer: 20 mL/min — ABNORMAL LOW (ref >60)
GFR calc non Af Amer: 18 mL/min — ABNORMAL LOW (ref >60)
Glucose, Bld: 136 mg/dL — ABNORMAL HIGH (ref 70–99)
Potassium: 4.3 mmol/L (ref 3.5–5.1)
Sodium: 141 mmol/L (ref 135–145)
Total Bilirubin: 0.9 mg/dL (ref 0.3–1.2)
Total Protein: 5.5 g/dL — ABNORMAL LOW (ref 6.5–8.1)

## 2019-09-20 LAB — LACTIC ACID, PLASMA: Lactic Acid, Venous: 1.6 mmol/L (ref 0.5–1.9)

## 2019-09-20 LAB — SODIUM, URINE, RANDOM: Sodium, Ur: 78 mmol/L

## 2019-09-20 LAB — PROTIME-INR
INR: 1.3 — ABNORMAL HIGH (ref 0.8–1.2)
Prothrombin Time: 15.5 seconds — ABNORMAL HIGH (ref 11.4–15.2)

## 2019-09-20 LAB — CREATININE, URINE, RANDOM: Creatinine, Urine: 142.59 mg/dL

## 2019-09-20 LAB — MAGNESIUM: Magnesium: 2.2 mg/dL (ref 1.7–2.4)

## 2019-09-20 LAB — APTT: aPTT: 32 seconds (ref 24–36)

## 2019-09-20 LAB — SARS CORONAVIRUS 2 BY RT PCR (HOSPITAL ORDER, PERFORMED IN ~~LOC~~ HOSPITAL LAB): SARS Coronavirus 2: NEGATIVE

## 2019-09-20 MED ORDER — SODIUM CHLORIDE 0.9 % IV SOLN
2.0000 g | INTRAVENOUS | Status: DC
  Administered 2019-09-20 – 2019-09-21 (×2): 2 g via INTRAVENOUS
  Filled 2019-09-20 (×2): qty 2

## 2019-09-20 MED ORDER — LACTATED RINGERS IV BOLUS
500.0000 mL | Freq: Once | INTRAVENOUS | Status: AC
  Administered 2019-09-20: 500 mL via INTRAVENOUS

## 2019-09-20 MED ORDER — MIRTAZAPINE 15 MG PO TABS: 7.5000 mg | ORAL_TABLET | Freq: Every evening | ORAL | Status: DC | PRN

## 2019-09-20 MED ORDER — HEPARIN SODIUM (PORCINE) 5000 UNIT/ML IJ SOLN
5000.0000 [IU] | Freq: Three times a day (TID) | INTRAMUSCULAR | Status: DC
  Administered 2019-09-20 – 2019-10-01 (×35): 5000 [IU] via SUBCUTANEOUS
  Filled 2019-09-20 (×33): qty 1

## 2019-09-20 MED ORDER — DORZOLAMIDE HCL-TIMOLOL MAL 2-0.5 % OP SOLN
1.0000 [drp] | Freq: Two times a day (BID) | OPHTHALMIC | Status: DC
  Administered 2019-09-20 – 2019-10-01 (×24): 1 [drp] via OPHTHALMIC
  Filled 2019-09-20: qty 10

## 2019-09-20 MED ORDER — VANCOMYCIN HCL 500 MG/100ML IV SOLN: 500.0000 mg | INTRAVENOUS | Status: DC

## 2019-09-20 MED ORDER — METRONIDAZOLE IN NACL 5-0.79 MG/ML-% IV SOLN
500.0000 mg | Freq: Three times a day (TID) | INTRAVENOUS | Status: DC
  Administered 2019-09-20 – 2019-09-22 (×7): 500 mg via INTRAVENOUS
  Filled 2019-09-20 (×7): qty 100

## 2019-09-20 MED ORDER — SODIUM CHLORIDE 0.9 % IV SOLN: INTRAVENOUS | Status: AC

## 2019-09-20 MED ORDER — LATANOPROST 0.005 % OP SOLN
1.0000 [drp] | Freq: Every day | OPHTHALMIC | Status: DC
  Administered 2019-09-20 – 2019-10-01 (×12): 1 [drp] via OPHTHALMIC
  Filled 2019-09-20: qty 2.5

## 2019-09-20 MED ORDER — BRIMONIDINE TARTRATE 0.15 % OP SOLN
1.0000 [drp] | Freq: Two times a day (BID) | OPHTHALMIC | Status: DC
  Administered 2019-09-20 – 2019-10-01 (×24): 1 [drp] via OPHTHALMIC
  Filled 2019-09-20: qty 5

## 2019-09-20 MED ORDER — CLOPIDOGREL BISULFATE 75 MG PO TABS
75.0000 mg | ORAL_TABLET | Freq: Every day | ORAL | Status: DC
  Administered 2019-09-20 – 2019-10-01 (×6): 75 mg via ORAL
  Filled 2019-09-20 (×7): qty 1

## 2019-09-20 MED ORDER — DEXTROSE-NACL 5-0.45 % IV SOLN: INTRAVENOUS | Status: DC

## 2019-09-20 NOTE — Progress Notes (Signed)
Admission history not completed by previous admitting RN. Patient has dementia and is a poor historian.  Day RN will be notified to obtain when family at Bedside.Roderick Pee

## 2019-09-20 NOTE — Progress Notes (Signed)
[facility]                                                                              Patient Demographics  [name], is a 86 y.o. female, DOB - 17-May-1931, KGS:[id number]  Admit date - 09/19/2019   Admitting Physician Chauncey Mann, MD  Outpatient Primary MD for the patient is Carol Ada, MD  Outpatient specialists:   LOS - 0  days   Medical records reviewed and are as summarized below:    Chief Complaint  Patient presents with  . Diarrhea  . Emesis       Brief summary   Patient is 86 year old female with history of hypertension, hyperlipidemia, CAD, CVA, dementia presented with diarrhea, vomiting, was admitted for sepsis from presumed colitis Patient had reported vomiting and diarrhea for 2 days, unable to eat or drink anything on the day of admission, possible complaining of rectal soreness per granddaughter.  At baseline, normally able to walk with assistance, eat and usually fairly alert and awake.  Over last 24 hours prior to admission, she had been less responsive.  Per EMS, was initially hypotensive 90/50, improved with IV fluid bolus. In ED, lipase within normal limits, creatinine 2.35, alk phos 145, WBCs 23.8 lactate 2.1.  UA negative Chest x-ray hazy opacity in medial right lung base, early consolidation or atelectasis.  CT abdomen pelvis showed marked circumferential thickening and adjacent inflammation centered upon the rectum and distal sigmoid no associated pneumatosis, free air.  Differentials including infectious or inflammatory colitis.   Assessment & Plan    Principal Problem:   Sepsis ([place]), acute infectious colitis -Patient met sepsis criteria at the time of admission with acute kidney injury, leukocytosis, hypotensive, lactic acidosis -C. difficile, GI pathogen panel, blood cultures pending -Continue IV fluid hydration, DC vancomycin due to creatinine trending up -Continue IV cefepime and Flagyl -Continue to hold  antihypertensives. -Will place on clears as tolerated, placed on D5 half-normal saline at 100 cc an hour   Acute kidney injury -Creatinine 2.35 at the time of admission, baseline 0.73 on 07/17/2019 in the setting of #1, hypovolemia, ACE inhibitor -Increase IV fluid hydration, placed on D5 half-normal saline at 100 cc an hour -Creatinine worsened to 2.42 today.  If no significant improvement, will obtain renal ultrasound in a.m. -Hold lisinopril  Hypotension -Currently BP borderline -Hold atenolol, lisinopril, continue IV fluid hydration  Coronary artery disease -Continue to hold BP meds in the setting of hypotension, sepsis -Placing on clear liquid diet, will restart Plavix  Dementia -At baseline, can walk with assistance, eat and alert -Patient has not started Aricept, Namenda, will hold -Currently alert and awake, will restart Remeron as needed at bedtime   Code Status: Full CODE STATUS DVT Prophylaxis:   SCD's Family Communication: Discussed all imaging results, lab results, explained to the patient   Disposition Plan:     Status is: Inpatient  Remains inpatient appropriate because:Inpatient level of care appropriate due to severity of illness, creatinine trending up, still n.p.o., advancing diet to clears, increasing IV fluids   Dispo: The patient is from: Home  Anticipated d/c is to: Home              Anticipated d/c date is: 2 days              Patient currently is not medically stable to d/c.       Time Spent in minutes 25 minutes  Procedures:  CT abdomen  Consultants:   None  Antimicrobials:   Anti-infectives (From admission, onward)   Start     Dose/Rate Route Frequency Ordered Stop   09/21/19 1200  vancomycin (VANCOREADY) IVPB 500 mg/100 mL  Status:  Discontinued     500 mg 100 mL/hr over 60 Minutes Intravenous Every 36 hours 09/20/19 0449 09/20/19 0941   09/20/19 2200  ceFEPIme (MAXIPIME) 2 g in sodium chloride 0.9 % 100 mL IVPB     2  g 200 mL/hr over 30 Minutes Intravenous Every 24 hours 09/20/19 0449     09/20/19 0800  metroNIDAZOLE (FLAGYL) IVPB 500 mg     500 mg 100 mL/hr over 60 Minutes Intravenous Every 8 hours 09/20/19 0202     09/19/19 2245  ceFEPIme (MAXIPIME) 2 g in sodium chloride 0.9 % 100 mL IVPB     2 g 200 mL/hr over 30 Minutes Intravenous  Once 09/19/19 2236 09/19/19 2357   09/19/19 2245  metroNIDAZOLE (FLAGYL) IVPB 500 mg     500 mg 100 mL/hr over 60 Minutes Intravenous  Once 09/19/19 2236 09/20/19 0102   09/19/19 2245  vancomycin (VANCOCIN) IVPB 1000 mg/200 mL premix     1,000 mg 200 mL/hr over 60 Minutes Intravenous  Once 09/19/19 2236 09/20/19 0206          Medications  Scheduled Meds: . brimonidine  1 drop Left Eye BID  . clopidogrel  75 mg Oral Daily  . dorzolamide-timolol  1 drop Left Eye BID  . heparin  5,000 Units Subcutaneous Q8H  . latanoprost  1 drop Both Eyes QHS   Continuous Infusions: . ceFEPime (MAXIPIME) IV    . dextrose 5 % and 0.45% NaCl 100 mL/hr at 09/20/19 1026  . metronidazole 500 mg (09/20/19 0819)   PRN Meds:.mirtazapine      Subjective:   [name] was seen and examined today.  No acute nausea or vomiting.  No fevers or chills.  Patient denies dizziness, chest pain, shortness of breath,  new weakness, numbess, tingling.   Objective:   Vitals:   09/20/19 0442 09/20/19 0846 09/20/19 0917 09/20/19 1229  BP: 106/70 (!) 115/44 (!) 118/55 (!) 124/44  Pulse: 69 69  75  Resp: (!) '24 15  15  ' Temp: (!) 97.5 F (36.4 C) 98.3 F (36.8 C)  98.1 F (36.7 C)  TempSrc: Oral Oral  Oral  SpO2: 98% 97%  100%  Weight:        Intake/Output Summary (Last 24 hours) at 09/20/2019 1327 Last data filed at 09/20/2019 0102 Gross per 24 hour  Intake 192.86 ml  Output --  Net 192.86 ml     Wt Readings from Last 3 Encounters:  09/20/19 55 kg  07/15/19 55 kg  07/14/19 55 kg     Exam  General: Alert and awake, NAD  Cardiovascular: S1 S2 auscultated, no  murmurs, RRR  Respiratory: Clear to auscultation bilaterally, no wheezing, rales or rhonchi  Gastrointestinal: Soft, nontender, nondistended, + bowel sounds  Ext: no pedal edema bilaterally  Neuro: No new deficits  Musculoskeletal: No digital cyanosis, clubbing  Skin: No rashes  Psych: History of dementia,  alert and awake   Data Reviewed:  I have personally reviewed following labs and imaging studies  Micro Results Recent Results (from the past 240 hour(s))  SARS Coronavirus 2 by RT PCR (hospital order, performed in [facility] hospital lab) Nasopharyngeal Nasopharyngeal Swab     Status: None   Collection Time: 09/19/19 11:19 PM   Specimen: Nasopharyngeal Swab  Result Value Ref Range Status   SARS Coronavirus 2 NEGATIVE NEGATIVE Final    Comment: Performed at [facility], [place] [address] [place], [place] [address]    Radiology Reports CT ABDOMEN PELVIS WO CONTRAST  Result Date: 09/20/2019 CLINICAL DATA:  Nausea and vomiting EXAM: CT ABDOMEN AND PELVIS WITHOUT CONTRAST TECHNIQUE: Multidetector CT imaging of the abdomen and pelvis was performed following the standard protocol without IV contrast. COMPARISON:  CT 07/15/2019 FINDINGS: Lower chest: Small to moderate right pleural effusion with some adjacent passive atelectasis. Atelectatic changes present the left lung base as well. Normal heart size. No pericardial effusion. Atherosclerotic calcifications noted upon the coronary arteries as well as the included portions of the thoracic aorta. No pericardial effusion. Hepatobiliary: No visible liver lesions. Smooth liver surface contour. Normal hepatic attenuation. Mild distension of the gallbladder with dependently layering calcified gallstones towards the gallbladder neck. No visible intraductal gallstones or biliary ductal dilatation is seen. Pancreas: Diffuse pancreatic atrophy. No concerning pancreatic lesions, a inflammation, or ductal dilatation. Spleen: Normal  spleen. Small accessory splenule towards the hilum. Adrenals/Urinary Tract: Normal adrenal glands. Stable appearance of a fluid attenuation 3 cm cyst in the posterior right interpolar kidney no concerning renal mass. No urolithiasis or hydronephrosis. Urinary bladder is largely decompressed at the time of exam and therefore poorly evaluated by CT imaging. No gross bladder abnormality. Stomach/Bowel: Distal esophagus, stomach and duodenal sweep are unremarkable. No small bowel wall thickening or dilatation. No evidence of obstruction. Few sparse small bowel diverticula are noted no visible small bowel diverticular inflammation is seen however no proximal colonic wall thickening or dilatation. There is marked distal colonic inflammatory change extending in contiguous fashion from the level of the rectum through the distal sigmoid. Extensive stranding and inflammatory change noted in the perirectal and pericolonic fat. No free fluid or free air. No associated pneumatosis. Vascular/Lymphatic: Atherosclerotic calcifications throughout the abdominal aorta and branch vessels. No aneurysm or ectasia. No enlarged abdominopelvic lymph nodes. Reproductive: Normal appearance of the uterus and adnexal structures. Other: Inflammatory changes centered upon the rectosigmoid as above. Postsurgical changes noted in lateral left hip soft tissues. No abdominopelvic free air or fluid. No bowel containing hernia. Musculoskeletal: Multilevel degenerative changes are present in the imaged portions of the spine. Retrolisthesis of L2 on L3 and grade 1 anterolisthesis L4 on L5 is similar to prior. Surgical anchors noted in the right greater trochanter. Additional degenerative changes noted in both hips. Sclerotic changes thickening at the symphysis pubis are chronic and may reflect sequela of prior osteitis pubis. IMPRESSION: 1. Marked circumferential thickening and adjacent inflammation centered upon the rectum and distal sigmoid. No free  fluid or free air. No associated pneumatosis. Differential considerations include infectious or inflammatory colitis versus less likely ischemic colitis. 2. Small to moderate right pleural effusion with some adjacent passive atelectasis. 3. Cholelithiasis without CT evidence of acute cholecystitis. 4. Aortic Atherosclerosis (ICD10-I70.0). Electronically Signed   By: Lovena Le M.D.   On: 09/20/2019 00:09   DG Lumbar Spine Complete  Result Date: 09/01/2019 CLINICAL DATA:  Hip pain after fall. EXAM: LUMBAR SPINE - COMPLETE 4+  VIEW COMPARISON:  July 14, 2019. FINDINGS: Mild grade 1 retrolisthesis of L1-2 and L2-3 is noted secondary to moderate degenerative disc disease at these levels. Mild grade 1 anterolisthesis of L4-5 is noted secondary to posterior facet joint hypertrophy. Severe degenerative disc disease is noted at L5-S1. No fracture is noted. Atherosclerosis of abdominal aorta is noted. IMPRESSION: Multilevel degenerative disc disease. No acute abnormality seen in the lumbar spine. Aortic Atherosclerosis (ICD10-I70.0). Electronically Signed   By: Marijo Conception M.D.   On: 09/01/2019 13:34   CT Head Wo Contrast  Result Date: 09/01/2019 CLINICAL DATA:  Unwitnessed fall EXAM: CT HEAD WITHOUT CONTRAST TECHNIQUE: Contiguous axial images were obtained from the base of the skull through the vertex without intravenous contrast. COMPARISON:  07/14/2019 FINDINGS: Brain: No evidence of acute infarction, hemorrhage, hydrocephalus, extra-axial collection or mass lesion/mass effect. Extensive low-density changes within the periventricular and subcortical white matter compatible with chronic microvascular ischemic change. Mild-moderate diffuse cerebral volume loss. Vascular: Atherosclerotic calcifications involving the large vessels of the skull base. No unexpected hyperdense vessel. Skull: Normal. Negative for fracture or focal lesion. Sinuses/Orbits: No acute finding. Other: None. IMPRESSION: 1.  No acute  intracranial findings. 2. Chronic microvascular ischemic change and cerebral volume loss, stable from prior. Electronically Signed   By: Davina Poke D.O.   On: 09/01/2019 14:15   DG Chest Port 1 View  Result Date: 09/19/2019 CLINICAL DATA:  Sepsis, nausea vomiting and diarrhea with decreased appetite since Thursday. EXAM: PORTABLE CHEST 1 VIEW COMPARISON:  Radiograph 07/14/2019 FINDINGS: There is some hazy opacity in the medial right lung base, increased from prior. Remaining portions the lungs are predominantly clear. No pneumothorax or effusion. No convincing features of edema. Postsurgical changes related to prior CABG including aligned sternotomy wires and multiple surgical clips projecting over the mediastinum. Inferior most sternal suture is fractured, similar to comparison. The aorta is calcified. The remaining cardiomediastinal contours are unremarkable. Implantable loop recorder is noted. No acute osseous or soft tissue abnormality. Degenerative changes are present in the imaged spine and shoulders. IMPRESSION: 1. Hazy opacity in the medial right lung base, increased from prior. Findings could reflect early consolidation or atelectasis. 2. Stable postoperative changes related to prior CABG. Inferior most sternal suture is fractured, similar to comparison. 3.  Aortic Atherosclerosis (ICD10-I70.0). Electronically Signed   By: Lovena Le M.D.   On: 09/19/2019 23:12   DG Hips Bilat W or Wo Pelvis 3-4 Views  Result Date: 09/01/2019 CLINICAL DATA:  Status post fall, left hip pain EXAM: DG HIP (WITH OR WITHOUT PELVIS) 3-4V BILAT COMPARISON:  None. FINDINGS: Generalized osteopenia. No acute fracture or dislocation. 2 bone anchors are present in the right greater trochanter likely from gluteus tendon repair. Mild joint space narrowing of bilateral hips consistent with mild osteoarthritis. No aggressive osseous lesion. Lower lumbar spine spondylosis. Mild osteoarthritis of bilateral SI joints.  Degenerative changes of the pubic symphysis. IMPRESSION: No acute osseous injury of bilateral hips. Given the patient's age and osteopenia, if there is persistent clinical concern for an occult hip fracture, a MRI of the hip is recommended for increased sensitivity. Electronically Signed   By: Kathreen Devoid   On: 09/01/2019 13:33    Lab Data:  CBC: Recent Labs  Lab 09/19/19 2044 09/20/19 0537  WBC 23.8* 19.2*  HGB 13.1 11.9*  HCT 40.1 37.1  MCV 95.5 97.1  PLT 512* 425*   Basic Metabolic Panel: Recent Labs  Lab 09/19/19 2044 09/20/19 0537  NA 142 141  K 4.4 4.3  CL 105 108  CO2 22 22  GLUCOSE 158* 136*  BUN 38* 44*  CREATININE 2.35* 2.42*  CALCIUM 9.0 8.5*  MG 2.2  --   PHOS 5.9*  --    GFR: Estimated Creatinine Clearance: 14.5 mL/min (A) (by C-G formula based on SCr of 2.42 mg/dL (H)). Liver Function Tests: Recent Labs  Lab 09/19/19 2044 09/20/19 0537  AST 44* 40  ALT 26 24  ALKPHOS 145* 126  BILITOT 1.1 0.9  PROT 6.4* 5.5*  ALBUMIN 3.1* 2.8*   Recent Labs  Lab 09/19/19 2044  LIPASE 16   No results for input(s): AMMONIA in the last 168 hours. Coagulation Profile: Recent Labs  Lab 09/19/19 2318  INR 1.3*   Cardiac Enzymes: No results for input(s): CKTOTAL, CKMB, CKMBINDEX, TROPONINI in the last 168 hours. BNP (last 3 results) No results for input(s): PROBNP in the last 8760 hours. HbA1C: No results for input(s): HGBA1C in the last 72 hours. CBG: No results for input(s): GLUCAP in the last 168 hours. Lipid Profile: No results for input(s): CHOL, HDL, LDLCALC, TRIG, CHOLHDL, LDLDIRECT in the last 72 hours. Thyroid Function Tests: No results for input(s): TSH, T4TOTAL, FREET4, T3FREE, THYROIDAB in the last 72 hours. Anemia Panel: No results for input(s): VITAMINB12, FOLATE, FERRITIN, TIBC, IRON, RETICCTPCT in the last 72 hours. Urine analysis:    Component Value Date/Time   COLORURINE YELLOW 09/19/2019 2044   APPEARANCEUR CLEAR 09/19/2019 2044    APPEARANCEUR Clear 01/27/2016 1115   LABSPEC 1.016 09/19/2019 2044   PHURINE 5.0 09/19/2019 2044   GLUCOSEU NEGATIVE 09/19/2019 2044   HGBUR NEGATIVE 09/19/2019 2044   BILIRUBINUR NEGATIVE 09/19/2019 2044   BILIRUBINUR Negative 01/27/2016 [place] 09/19/2019 2044   PROTEINUR NEGATIVE 09/19/2019 2044   NITRITE NEGATIVE 09/19/2019 2044   LEUKOCYTESUR NEGATIVE 09/19/2019 2044     Jacere Pangborn M.D. [facility] 09/20/2019, 1:27 PM   Call night coverage person covering after 7pm

## 2019-09-20 NOTE — Progress Notes (Signed)
Patient's blood pressure is 118/55  this morning.  Dr Tana Coast paged.

## 2019-09-20 NOTE — Progress Notes (Signed)
Pharmacy Antibiotic Note  [name] is a 86 y.o. female admitted on 09/19/2019 with sepsis.  Pharmacy has been consulted for Vancomycin, cefepime dosing.  Plan: Vancomycin 1gm iv x1, then Vancomycin 500 mg IV Q 36 hrs. Goal AUC 400-550. Expected AUC: 488 SCr used: 2.35  Cefepime 2gm iv x1, then 2gm iv q24hr   Weight: 55 kg (121 lb 4.1 oz)(last noted )  Temp (24hrs), Avg:97.8 F (36.6 C), Min:97.5 F (36.4 C), Max:98.5 F (36.9 C)  Recent Labs  Lab 09/19/19 2044 09/19/19 2224 09/20/19 0111  WBC 23.8*  --   --   CREATININE 2.35*  --   --   LATICACIDVEN  --  2.1* 1.6    Estimated Creatinine Clearance: 14.9 mL/min (A) (by C-G formula based on SCr of 2.35 mg/dL (H)).    Allergies  Allergen Reactions  . Nsaids Other (See Comments)    URINARY RETENTION  . Statins Other (See Comments)    MYALGIAS HURTS ALL OVER  . Tricor [Fenofibrate] Other (See Comments)    MYALGIAS HURTS ALL OVER  . Zetia [Ezetimibe] Other (See Comments)    MYALGIAS HURTS ALL OVER  . Lescol [Fluvastatin]     Muscle pain  . Livalo [Pitavastatin]     Muscle pain  . Niaspan [Niacin]     Severe flushing   . Relafen [Nabumetone] Rash    Antimicrobials this admission: Vancomycin 09/19/2019 >> Cefepime 09/19/2019 >>    Dose adjustments this admission: -  Microbiology results: -  Thank you for allowing pharmacy to be a part of this patient's care.  Nani Skillern Crowford 09/20/2019 4:50 AM

## 2019-09-20 NOTE — H&P (Signed)
[facility] History and Physical  [name]:[id number] DOB: 09-May-1931 DOA: 09/19/2019  Referring EDP: Phoebe Sharps PCP: Carol Ada, MD   Chief Complaint: Diarrhea/emesis  HPI: [name] is a 86 y.o. female with PMH of HTN, HLD, CAD, CVA and dementia who presents to ED with diarrhea and vomiting and admitted for sepsis from presumed colitis.   History provided by EDP and chart review as history already obtained from granddaughter by EDP and did not want to call again at this hour.  Granddaughter reported that patient lives with her. Yesterday she noted patient to have vomiting and diarrhea which has persisted to today. Patient unable to eat or drink anything today. Patient possible complaining of "rectal soreness" per granddaughter. At baseline, patient is normally able to walk with assistance, eat and is usually fairly alert and awake. Granddaughter noted that over last 24 hours she has been less responsive. Denied sick contacts, respiratory symptoms, fever. EMS reported patient was initially hypotensive to 90/50 which improved with fluid bolus. When patient was asked if she had pain during my exam, she stated yes but unable to further clarify.   In the ED: Vitals stable on room air. Labs remarkable for Lipase WNL, glucose 158, Cr 2.35, Alk Phos 145, WBC 23.8 otherwise CBC WNL. UA without evidence of infection. Lactate 2.1. Blood cx drawn.  CXR:  1. Hazy opacity in the medial right lung base, increased from prior. Findings could reflect early consolidation or atelectasis. 2. Stable postoperative changes related to prior CABG. Inferior most sternal suture is fractured, similar to comparison. 3.  Aortic Atherosclerosis (ICD10-I70.0).  CT Abd/Pelv: 1. Marked circumferential thickening and adjacent inflammation centered upon the rectum and distal sigmoid. No free fluid or free air. No associated pneumatosis. Differential considerations include infectious or inflammatory  colitis versus less likely ischemic colitis. 2. Small to moderate right pleural effusion with some adjacent passive atelectasis. 3. Cholelithiasis without CT evidence of acute cholecystitis. 4. Aortic Atherosclerosis (ICD10-I70.0).  Patient was given Vanc/Cefepime/Flagyl and called for admission for sepsis with presumed abdominal source.   Review of Systems:  Unable to obtain due to patient factors: Dementia and AMS.  Past Medical History:  Diagnosis Date  . Actinic keratosis   . Allergic rhinitis   . Aneurysm ([place])   . Arthritis    osteoarthritis, back, hands, wrists  . Basal cell carcinoma   . Bursitis    hips bilat   . CAD (coronary artery disease), autologous vein bypass graft March 2008   Follow-up cath: December 2015 Occluded SVG-D1 and occluded SVG-RI.  [name] CAD in native artery 2005, 03/2014   a.  Severe disease noted in LAD & RI --> referred for CABG x3; b.  Follow-up cath for abnormal Myoview: Occluded SVG-RI and SVG-D1 with patent LIMA-LAD and competitive flow.  60-70% LAD and RI lesions.  Otherwise minimal disease.  EF 70%.  . Cancer ([place])    + basal cell- on leg  . Cataract   . Dementia ([place])   . Dyslipidemia    Statin intolerant  . Glaucoma   . History of kidney stones   . Hypertension   . Non-STEMI (non-ST elevated myocardial infarction) [facility]) October 2005   EF by 35-40%, echo 40-50%. Angiography: 99% mid LAD involving D1 followed by 70% mid LAD; 80% RI. --> CABG  . Osteoarthritis   . PONV (postoperative nausea and vomiting)   . S/P CABG x 01 February 2004   LIMA-LAD, SVG-D1, SVG-RI.  [name] Statin intolerance   .  Stroke [facility])    Past Surgical History:  Procedure Laterality Date  . BUNIONECTOMY     2002  . CARDIOVASCULAR STRESS TEST  05/23/2006   Mild lateral/inferolateral ischemia, likely due to occluded SVGs with exhisting disease.  [name] CAROTID DOPPLER  07/15/2009   Bilat ICAs 0-49% diameter reduction. Normal patency of Bilat subclavian arteries.  . CORONARY  ARTERY BYPASS GRAFT  02/22/2004   x3. LIMA to distal LAD, SVG to first diag, SVG to ramus. SVG harvest from rt thigh.  . EP IMPLANTABLE DEVICE N/A 03/02/2016   Procedure: Loop Recorder Insertion;  Surgeon: Thompson Grayer, MD;  Location: [place] CV LAB;  Service: Cardiovascular;  Laterality: N/A;  . EXCISION/RELEASE BURSA HIP Right 06/15/2015   Procedure: RIGHT HIP BURSECTOMY WITH GLUTEAL TENDON REPAIR;  Surgeon: Gaynelle Arabian, MD;  Location: [facility];  Service: Orthopedics;  Laterality: Right;  . EYE SURGERY     cataract surgery bilat   . IR ANGIO INTRA EXTRACRAN SEL COM CAROTID INNOMINATE BILAT MOD SED  08/07/2016  . IR ANGIO VERTEBRAL SEL SUBCLAVIAN INNOMINATE BILAT MOD SED  08/07/2016  . IR GENERIC HISTORICAL  06/01/2016   IR RADIOLOGIST EVAL & MGMT 06/01/2016 [facility]-INTERV RAD  . IR RADIOLOGIST EVAL & MGMT  05/07/2017  . LEFT HEART CATHETERIZATION WITH CORONARY ANGIOGRAM  02/19/2004   Significant 2 vessel CAD - LAD, D1 and RI  . LEFT HEART CATHETERIZATION WITH CORONARY/GRAFT ANGIOGRAM N/A 04/28/2014   Procedure: LEFT HEART CATHETERIZATION WITH Beatrix Fetters;  Surgeon: Sinclair Grooms, MD;  Location: [facility] CATH LAB: CTO of SVG-Diag & SVG-RI, patent LIMA-LAD. Patent native circumflex, LAD and RCA. 70% stenosis in a branch of RI. --> Does not explain "high risk perfusion study "  . LEFT HEART CATHETERIZATION WITH CORONARY/GRAFT ANGIOGRAM   07/25/2006   Totally occluded SVG to diag and ramus. Patent LIMA-LAD. Ramus 70-80% proximal stenosis and occluded vein graft.  [name] left total knee replacement      2001  . NM MYOVIEW LTD  January 2008; December 2015   a. Referred for For mild inferolateral and anterolateral/apical lateral defect with mild reversibility.;; b. Large defect in the anterior and inferior wall. Suggestive of potential infarct plus ischemia. HIGH RISK.  [name] RADIOLOGY WITH ANESTHESIA N/A 08/07/2016   Procedure: EMBOLIZATION;  Surgeon: Luanne Bras, MD;  Location: [facility];  Service:  Radiology;  Laterality: N/A;  . right total knee replacement      2003  . TRANSTHORACIC ECHOCARDIOGRAM  02/19/2004; December 2015   a. EF 45-50%, Normal LV function, moderate hypokinesis of anterior wall.;; b. EF 50-55%. No RWMA, GR 1 DD. Normal valves    Social History:  reports that she has never smoked. She has never used smokeless tobacco. She reports previous alcohol use of about 1.0 standard drinks of alcohol per week. She reports that she does not use drugs.  Allergies  Allergen Reactions  . Nsaids Other (See Comments)    URINARY RETENTION  . Statins Other (See Comments)    MYALGIAS HURTS ALL OVER  . Tricor [Fenofibrate] Other (See Comments)    MYALGIAS HURTS ALL OVER  . Zetia [Ezetimibe] Other (See Comments)    MYALGIAS HURTS ALL OVER  . Lescol [Fluvastatin]     Muscle pain  . Livalo [Pitavastatin]     Muscle pain  . Niaspan [Niacin]     Severe flushing   . Relafen [Nabumetone] Rash    Family History  Problem Relation Age of Onset  . Alzheimer's disease Mother   .  Alzheimer's disease Sister   . Hyperlipidemia Sister   . Hypertension Sister   . Diabetes Sister   . Hyperlipidemia Sister   . Hypertension Sister   . Pancreatitis Child     Prior to Admission medications   Medication Sig Start Date End Date Taking? Authorizing Provider  aspirin EC 81 MG tablet Take 81 mg by mouth at bedtime.    Yes [provider]  atenolol (TENORMIN) 25 MG tablet Take 1 tablet (25 mg total) daily by mouth. May take an additional 25 mg of medication daily, if SBP> 160 or DBP >100 Patient taking differently: Take 25 mg by mouth at bedtime. May take an additional 25 mg of medication daily, if SBP> 160 or DBP >100 03/14/17  Yes Leonie Man, MD  brimonidine (ALPHAGAN P) 0.1 % SOLN Place 1 drop into the left eye in the morning and at bedtime.   Yes [provider]  Cholecalciferol (VITAMIN D3) 50 MCG (2000 UT) capsule Take 1 capsule (2,000 Units total) by mouth at  bedtime. 04/09/19  Yes Leonie Man, MD  clopidogrel (PLAVIX) 75 MG tablet Take 1 tablet (75 mg total) by mouth daily. 05/26/19  Yes Leonie Man, MD  dorzolamide-timolol (COSOPT) 22.3-6.8 MG/ML ophthalmic solution Place 1 drop into the left eye 2 (two) times daily.  11/14/17  Yes [provider]  latanoprost (XALATAN) 0.005 % ophthalmic solution Place 1 drop into both eyes at bedtime. 06/26/16  Yes [provider]  lisinopril (PRINIVIL,ZESTRIL) 20 MG tablet Take 20 mg by mouth at bedtime.    Yes [provider]  mirtazapine (REMERON) 7.5 MG tablet Take 7.5 mg by mouth at bedtime as needed (sleep/anxiety).  09/08/19  Yes [provider]  donepezil (ARICEPT) 10 MG tablet Take 1 tablet (10 mg total) by mouth at bedtime. 08/21/19   Lomax, Amy, NP  memantine (NAMENDA) 5 MG tablet Take 1 tablet (5 mg total) by mouth 2 (two) times daily. 08/21/19   Debbora Presto, NP   Physical Exam: Vitals:   09/19/19 2212 09/19/19 2300 09/20/19 0000 09/20/19 0001  BP: (!) 111/49 (!) 121/48 (!) 110/49   Pulse: 73 71  73  Resp: 18  18   Temp:      SpO2: 98% 97%  97%    Wt Readings from Last 3 Encounters:  07/15/19 55 kg  07/14/19 55 kg  05/09/19 54.4 kg    . General:  Resting in bed. Barely opens eyes. Mumbles to name. Does not follow commands. . Eyes: EOMI, normal lids, irises & conjunctiva . ENT: grossly normal hearing, lips & tongue . Neck: normal ROM . Cardiovascular: RRR, no m/r/g. No LE edema. [name] Respiratory: CTA bilaterally, no w/r/r. Normal respiratory effort. . Abdomen: soft, but generalized TTP most noted in lower and mid quadrants . Skin: warm and dry  . Musculoskeletal: grossly normal tone BUE/BLE . Psychiatric: semi-responsive to voice; mumbled speech  . Neurologic: grossly non-focal, does not follow commands or answer questions          Labs on Admission:  Basic Metabolic Panel: Recent Labs  Lab 09/19/19 2044  NA 142  K 4.4  CL 105  CO2 22    GLUCOSE 158*  BUN 38*  CREATININE 2.35*  CALCIUM 9.0   Liver Function Tests: Recent Labs  Lab 09/19/19 2044  AST 44*  ALT 26  ALKPHOS 145*  BILITOT 1.1  PROT 6.4*  ALBUMIN 3.1*   Recent Labs  Lab 09/19/19 2044  LIPASE  16   No results for input(s): AMMONIA in the last 168 hours. CBC: Recent Labs  Lab 09/19/19 2044  WBC 23.8*  HGB 13.1  HCT 40.1  MCV 95.5  PLT 512*   Cardiac Enzymes: No results for input(s): CKTOTAL, CKMB, CKMBINDEX, TROPONINI in the last 168 hours.  BNP (last 3 results) No results for input(s): BNP in the last 8760 hours.  ProBNP (last 3 results) No results for input(s): PROBNP in the last 8760 hours.  CBG: No results for input(s): GLUCAP in the last 168 hours.  Radiological Exams on Admission: CT ABDOMEN PELVIS WO CONTRAST  Result Date: 09/20/2019 CLINICAL DATA:  Nausea and vomiting EXAM: CT ABDOMEN AND PELVIS WITHOUT CONTRAST TECHNIQUE: Multidetector CT imaging of the abdomen and pelvis was performed following the standard protocol without IV contrast. COMPARISON:  CT 07/15/2019 FINDINGS: Lower chest: Small to moderate right pleural effusion with some adjacent passive atelectasis. Atelectatic changes present the left lung base as well. Normal heart size. No pericardial effusion. Atherosclerotic calcifications noted upon the coronary arteries as well as the included portions of the thoracic aorta. No pericardial effusion. Hepatobiliary: No visible liver lesions. Smooth liver surface contour. Normal hepatic attenuation. Mild distension of the gallbladder with dependently layering calcified gallstones towards the gallbladder neck. No visible intraductal gallstones or biliary ductal dilatation is seen. Pancreas: Diffuse pancreatic atrophy. No concerning pancreatic lesions, a inflammation, or ductal dilatation. Spleen: Normal spleen. Small accessory splenule towards the hilum. Adrenals/Urinary Tract: Normal adrenal glands. Stable appearance of a fluid  attenuation 3 cm cyst in the posterior right interpolar kidney no concerning renal mass. No urolithiasis or hydronephrosis. Urinary bladder is largely decompressed at the time of exam and therefore poorly evaluated by CT imaging. No gross bladder abnormality. Stomach/Bowel: Distal esophagus, stomach and duodenal sweep are unremarkable. No small bowel wall thickening or dilatation. No evidence of obstruction. Few sparse small bowel diverticula are noted no visible small bowel diverticular inflammation is seen however no proximal colonic wall thickening or dilatation. There is marked distal colonic inflammatory change extending in contiguous fashion from the level of the rectum through the distal sigmoid. Extensive stranding and inflammatory change noted in the perirectal and pericolonic fat. No free fluid or free air. No associated pneumatosis. Vascular/Lymphatic: Atherosclerotic calcifications throughout the abdominal aorta and branch vessels. No aneurysm or ectasia. No enlarged abdominopelvic lymph nodes. Reproductive: Normal appearance of the uterus and adnexal structures. Other: Inflammatory changes centered upon the rectosigmoid as above. Postsurgical changes noted in lateral left hip soft tissues. No abdominopelvic free air or fluid. No bowel containing hernia. Musculoskeletal: Multilevel degenerative changes are present in the imaged portions of the spine. Retrolisthesis of L2 on L3 and grade 1 anterolisthesis L4 on L5 is similar to prior. Surgical anchors noted in the right greater trochanter. Additional degenerative changes noted in both hips. Sclerotic changes thickening at the symphysis pubis are chronic and may reflect sequela of prior osteitis pubis. IMPRESSION: 1. Marked circumferential thickening and adjacent inflammation centered upon the rectum and distal sigmoid. No free fluid or free air. No associated pneumatosis. Differential considerations include infectious or inflammatory colitis versus less  likely ischemic colitis. 2. Small to moderate right pleural effusion with some adjacent passive atelectasis. 3. Cholelithiasis without CT evidence of acute cholecystitis. 4. Aortic Atherosclerosis (ICD10-I70.0). Electronically Signed   By: Lovena Le M.D.   On: 09/20/2019 00:09   DG Chest Port 1 View  Result Date: 09/19/2019 CLINICAL DATA:  Sepsis, nausea vomiting and diarrhea with decreased appetite since  Thursday. EXAM: PORTABLE CHEST 1 VIEW COMPARISON:  Radiograph 07/14/2019 FINDINGS: There is some hazy opacity in the medial right lung base, increased from prior. Remaining portions the lungs are predominantly clear. No pneumothorax or effusion. No convincing features of edema. Postsurgical changes related to prior CABG including aligned sternotomy wires and multiple surgical clips projecting over the mediastinum. Inferior most sternal suture is fractured, similar to comparison. The aorta is calcified. The remaining cardiomediastinal contours are unremarkable. Implantable loop recorder is noted. No acute osseous or soft tissue abnormality. Degenerative changes are present in the imaged spine and shoulders. IMPRESSION: 1. Hazy opacity in the medial right lung base, increased from prior. Findings could reflect early consolidation or atelectasis. 2. Stable postoperative changes related to prior CABG. Inferior most sternal suture is fractured, similar to comparison. 3.  Aortic Atherosclerosis (ICD10-I70.0). Electronically Signed   By: Lovena Le M.D.   On: 09/19/2019 23:12    EKG: Independently reviewed. HR 70. Sinus rhythm. QTc 481. No STEMI.  Assessment/Plan Principal Problem:   Sepsis ([place]) Active Problems:   CAD (coronary artery disease), autologous vein bypass graft   Essential hypertension   Hyperlipidemia LDL goal <70   Memory loss   Dementia without behavioral disturbance ([name])   AKI (acute kidney injury) ([place])   Colitis  86 y.o. female with PMH of HTN, HLD, CAD, CVA and dementia who  presents to ED with diarrhea and vomiting and admitted for sepsis from presumed colitis.   Sepsis; POA Presumed secondary to Colitis  - Hx of vomiting and diarrhea x 24 hours - WBC elevated, hypotensive with EMS, lactate elevated - Fluid bolus on admission; repeat PRN (no hx CHF) - F/u Blood Cx - F/u GI panel and C diff screen  - F/u Lactate until normal - Cont Vanc/Cefepime/Flagyl - UA negative; f/u cx - CXR with possible opacity; monitor respiratory symptoms  - Hold home anti-hypertensives - NPO due to decreased mentation - PT/OT when recovering   AKI - Cr 2.35 on admission with normal baseline - Likely in setting of hypovolemia - Cont IVF - Ur Na and Cr ordered  - Avoid nephrotoxic agents - hold home BP meds  - heparin  - CrCl ~ 15  HTN CAD - hold BP meds in setting of hypotension/infection/AKI - patient normally takes Atenolol and Lisinopril - Hold Plavix as patient currently NPO due to altered mentation  Dementia - at baseline can walk with assistance, eat and is awake/alert - decreased function/mentation likely secondary to acute infection - Home meds Aricept and Namenda had not yet been started so will not give plus NPO currently  - Hold Mirtazapine in setting of altered mentation/drowsiness  HLD - no home meds  Code Status: Full; presumed (Full code on last admission; confirm with granddaughter) DVT Prophylaxis: Heparin; CrCl ~ 15 Family Communication: None at this time due to hour and granddaughter already spoke with EDP Disposition Plan: Admit to Inpatient. Patient is at high risk for further decompensation due to age and co-morbidities. Anticipate discharge home in 5-7 days.    Time spent: 70 minutes  Chauncey Mann, MD [facility] Pager [phone]

## 2019-09-20 NOTE — Progress Notes (Signed)
   09/20/19 0226  Vitals  Temp (!) 97.5 F (36.4 C)  Temp Source Oral  BP (!) 93/46  MAP (mmHg) (!) 60  BP Location Left Arm  BP Method Automatic  Patient Position (if appropriate) Lying  Pulse Rate 72  Resp (!) 25  Oxygen Therapy  SpO2 98 %  O2 Device Room Air  MEWS Score  MEWS Temp 0  MEWS Systolic 1  MEWS Pulse 0  MEWS RR 1  MEWS LOC 1  MEWS Score 3  MEWS Score Color Yellow  Provider Notification  Provider Name/Title Randol Kern  Date Provider Notified 09/20/19  Time Provider Notified 0250  Notification Type Page  Notification Reason Change in status  Response See new orders  Date of Provider Response 09/20/19  Time of Provider Response [phone]

## 2019-09-21 DIAGNOSIS — L8990 Pressure ulcer of unspecified site, unspecified stage: Secondary | ICD-10-CM | POA: Insufficient documentation

## 2019-09-21 LAB — CBC
HCT: 33.5 % — ABNORMAL LOW (ref 36.0–46.0)
Hemoglobin: 10.7 g/dL — ABNORMAL LOW (ref 12.0–15.0)
MCH: 30.7 pg (ref 26.0–34.0)
MCHC: 31.9 g/dL (ref 30.0–36.0)
MCV: 96.3 fL (ref 80.0–100.0)
Platelets: 361 10*3/uL (ref 150–400)
RBC: 3.48 MIL/uL — ABNORMAL LOW (ref 3.87–5.11)
RDW: 13 % (ref 11.5–15.5)
WBC: 12.6 10*3/uL — ABNORMAL HIGH (ref 4.0–10.5)
nRBC: 0 % (ref 0.0–0.2)

## 2019-09-21 LAB — COMPREHENSIVE METABOLIC PANEL
ALT: 22 U/L (ref 0–44)
AST: 46 U/L — ABNORMAL HIGH (ref 15–41)
Albumin: 2.5 g/dL — ABNORMAL LOW (ref 3.5–5.0)
Alkaline Phosphatase: 110 U/L (ref 38–126)
Anion gap: 8 (ref 5–15)
BUN: 40 mg/dL — ABNORMAL HIGH (ref 8–23)
CO2: 23 mmol/L (ref 22–32)
Calcium: 8.2 mg/dL — ABNORMAL LOW (ref 8.9–10.3)
Chloride: 108 mmol/L (ref 98–111)
Creatinine, Ser: 1.47 mg/dL — ABNORMAL HIGH (ref 0.44–1.00)
GFR calc Af Amer: 37 mL/min — ABNORMAL LOW (ref >60)
GFR calc non Af Amer: 32 mL/min — ABNORMAL LOW (ref >60)
Glucose, Bld: 120 mg/dL — ABNORMAL HIGH (ref 70–99)
Potassium: 3.6 mmol/L (ref 3.5–5.1)
Sodium: 139 mmol/L (ref 135–145)
Total Bilirubin: 0.6 mg/dL (ref 0.3–1.2)
Total Protein: 5.3 g/dL — ABNORMAL LOW (ref 6.5–8.1)

## 2019-09-21 LAB — BLOOD CULTURE ID PANEL (REFLEXED)

## 2019-09-21 LAB — URINE CULTURE: Culture: NO GROWTH

## 2019-09-21 NOTE — Progress Notes (Signed)
[facility]                                                                              Patient Demographics  [name], is a 86 y.o. female, DOB - 08-30-1931, IDC:[id number]  Admit date - 09/19/2019   Admitting Physician Chauncey Mann, MD  Outpatient Primary MD for the patient is Carol Ada, MD  Outpatient specialists:   LOS - 1  days   Medical records reviewed and are as summarized below:    Chief Complaint  Patient presents with  . Diarrhea  . Emesis       Brief summary   Patient is 86 year old female with history of hypertension, hyperlipidemia, CAD, CVA, dementia presented with diarrhea, vomiting, was admitted for sepsis from presumed colitis Patient had reported vomiting and diarrhea for 2 days, unable to eat or drink anything on the day of admission, possible complaining of rectal soreness per granddaughter.  At baseline, normally able to walk with assistance, eat and usually fairly alert and awake.  Over last 24 hours prior to admission, she had been less responsive.  Per EMS, was initially hypotensive 90/50, improved with IV fluid bolus. In ED, lipase within normal limits, creatinine 2.35, alk phos 145, WBCs 23.8 lactate 2.1.  UA negative Chest x-ray hazy opacity in medial right lung base, early consolidation or atelectasis.  CT abdomen pelvis showed marked circumferential thickening and adjacent inflammation centered upon the rectum and distal sigmoid no associated pneumatosis, free air.  Differentials including infectious or inflammatory colitis.   Assessment & Plan    Principal Problem:   Sepsis ([place]), acute infectious colitis -Patient met sepsis criteria at the time of admission with acute kidney injury, leukocytosis, hypotensive, lactic acidosis -C. difficile, GI pathogen panel, BC ID staph possibly contaminant -Vancomycin discontinued, continue IV fluid hydration today, creatinine improving 1.4 today -Continue IV cefepime and  Flagyl -Continue to hold antihypertensives. -Advance diet to full liquids, decrease IV fluids to 75 cc an hour   Acute kidney injury -Creatinine 2.35 at the time of admission, baseline 0.73 on 07/17/2019 in the setting of #1, hypovolemia, ACE inhibitor -Creatinine improving 1.4 today, continue gentle hydration today  Hypotension -BP is now improving, continue to hold ACE inhibitor  Coronary artery disease -Continue to hold BP meds in the setting of hypotension, sepsis -Continue Plavix, full liquid diet  Dementia -At baseline, can walk with assistance, eat and alert -Patient has not started Aricept, Namenda, will hold -Currently alert and awake, will restart Remeron as needed at bedtime   Code Status: Full CODE STATUS DVT Prophylaxis:   SCD's Family Communication: Discussed all imaging results, lab results, explained to the patient.  Called patient's granddaughter on the phone, unable to make contact, left detailed message.  Disposition Plan:     Status is: Inpatient  Remains inpatient appropriate because:Inpatient level of care appropriate due to severity of illness, creatinine improving, still not at baseline, hopefully DC in 24 to 48 hours if tolerating diet   Dispo: The patient is from: Home              Anticipated d/c is to: Home  Anticipated d/c date is: 2 days              Patient currently is not medically stable to d/c.       Time Spent in minutes 25 minutes  Procedures:  CT abdomen  Consultants:   None  Antimicrobials:   Anti-infectives (From admission, onward)   Start     Dose/Rate Route Frequency Ordered Stop   09/21/19 1200  vancomycin (VANCOREADY) IVPB 500 mg/100 mL  Status:  Discontinued     500 mg 100 mL/hr over 60 Minutes Intravenous Every 36 hours 09/20/19 0449 09/20/19 0941   09/20/19 2200  ceFEPIme (MAXIPIME) 2 g in sodium chloride 0.9 % 100 mL IVPB     2 g 200 mL/hr over 30 Minutes Intravenous Every 24 hours 09/20/19 0449      09/20/19 0800  metroNIDAZOLE (FLAGYL) IVPB 500 mg     500 mg 100 mL/hr over 60 Minutes Intravenous Every 8 hours 09/20/19 0202     09/19/19 2245  ceFEPIme (MAXIPIME) 2 g in sodium chloride 0.9 % 100 mL IVPB     2 g 200 mL/hr over 30 Minutes Intravenous  Once 09/19/19 2236 09/19/19 2357   09/19/19 2245  metroNIDAZOLE (FLAGYL) IVPB 500 mg     500 mg 100 mL/hr over 60 Minutes Intravenous  Once 09/19/19 2236 09/20/19 0102   09/19/19 2245  vancomycin (VANCOCIN) IVPB 1000 mg/200 mL premix     1,000 mg 200 mL/hr over 60 Minutes Intravenous  Once 09/19/19 2236 09/20/19 0206         Medications  Scheduled Meds: . brimonidine  1 drop Left Eye BID  . clopidogrel  75 mg Oral Daily  . dorzolamide-timolol  1 drop Left Eye BID  . heparin  5,000 Units Subcutaneous Q8H  . latanoprost  1 drop Both Eyes QHS   Continuous Infusions: . ceFEPime (MAXIPIME) IV Stopped (09/20/19 2316)  . dextrose 5 % and 0.45% NaCl 100 mL/hr at 09/21/19 0653  . metronidazole 500 mg (09/21/19 0913)   PRN Meds:.mirtazapine      Subjective:   [name] was seen and examined today.  No acute issues, no reported vomiting fever chills abdominal pain or any diarrhea.  However very difficult to obtain review of system from the patient due to her mental status/dementia  Objective:   Vitals:   09/20/19 0917 09/20/19 1229 09/20/19 2054 09/21/19 0614  BP: (!) 118/55 (!) 124/44 (!) 120/56 (!) 144/61  Pulse:  75 68 70  Resp:  '15 18 16  ' Temp:  98.1 F (36.7 C) 98 F (36.7 C) 97.6 F (36.4 C)  TempSrc:  Oral Oral Oral  SpO2:  100% 98% 98%  Weight:        Intake/Output Summary (Last 24 hours) at 09/21/2019 1315 Last data filed at 09/21/2019 0500 Gross per 24 hour  Intake 1200 ml  Output --  Net 1200 ml     Wt Readings from Last 3 Encounters:  09/20/19 55 kg  07/15/19 55 kg  07/14/19 55 kg   Physical Exam  General: Alert and awake, does not respond to verbal questioning but smiles,  cooperative  Cardiovascular: S1 S2 clear, RRR. No pedal edema b/l  Respiratory: CTAB, no wheezing, rales or rhonchi  Gastrointestinal: Soft, nontender, nondistended, NBS  Ext: no pedal edema bilaterally  Neuro: no new deficits  Musculoskeletal: No cyanosis, clubbing  Skin: No rashes  Psych: Dementia    Data Reviewed:  I have personally reviewed following labs and  imaging studies  Micro Results Recent Results (from the past 240 hour(s))  Urine culture     Status: None   Collection Time: 09/19/19 10:23 PM   Specimen: Urine, Clean Catch  Result Value Ref Range Status   Specimen Description   Final    URINE, CLEAN CATCH Performed at [facility], [place] [address] [place], [place] [address]    Special Requests   Final    NONE Performed at [facility], [place] [address] [place], [place] [address]    Culture   Final    NO GROWTH Performed at [facility], [place] [address] [place], [place] [address]    Report Status 09/21/2019 FINAL  Final  Blood Culture (routine x 2)     Status: None (Preliminary result)   Collection Time: 09/19/19 11:18 PM   Specimen: BLOOD  Result Value Ref Range Status   Specimen Description   Final    BLOOD Performed at [place] [address], [place], [place] [address]    Special Requests   Final    NONE Performed at [facility], [place] [address], [place], [place] [address]    Culture  Setup Time   Final    GRAM POSITIVE COCCI IN CLUSTERS AEROBIC BOTTLE ONLY CRITICAL RESULT CALLED TO, READ BACK BY AND VERIFIED WITH: Audrea Muscat [id number] 09/21/2019 Mena Goes Performed at [facility], [place] [address] [place], [place] [address]    Culture GRAM POSITIVE COCCI  Final   Report Status PENDING  Incomplete  Blood Culture (routine x 2)     Status: None (Preliminary result)   Collection Time: 09/19/19 11:19 PM   Specimen: BLOOD  Result Value Ref Range Status    Specimen Description   Final    BLOOD Performed at [place] [address] [place], [place] [address]    Special Requests   Final    NONE Performed at [facility], [place] [address] [place], [place] [address]    Culture  Setup Time   Final    GRAM POSITIVE COCCI IN CLUSTERS AEROBIC BOTTLE ONLY CRITICAL RESULT CALLED TO, READ BACK BY AND VERIFIED WITH: Audrea Muscat [id number] 09/21/2019 Mena Goes Performed at [facility], [place] [address] [place],  [address]    Culture GRAM POSITIVE COCCI  Final   Report Status PENDING  Incomplete  SARS Coronavirus 2 by RT PCR (hospital order, performed in [facility] hospital lab) Nasopharyngeal Nasopharyngeal Swab     Status: None   Collection Time: 09/19/19 11:19 PM   Specimen: Nasopharyngeal Swab  Result Value Ref Range Status   SARS Coronavirus 2 NEGATIVE NEGATIVE Final    Comment: Performed at [facility], [place] [address], [place],  [address]  Blood Culture ID Panel (Reflexed)     Status: Abnormal   Collection Time: 09/19/19 11:19 PM  Result Value Ref Range Status   Enterococcus species NOT DETECTED NOT DETECTED Final   Listeria monocytogenes NOT DETECTED NOT DETECTED Final   Staphylococcus species DETECTED (A) NOT DETECTED Final    Comment: Methicillin (oxacillin) susceptible coagulase negative staphylococcus. Possible blood culture contaminant (unless isolated from more than one blood culture draw or clinical case suggests pathogenicity). No antibiotic treatment is indicated for blood  culture contaminants. CRITICAL RESULT CALLED TO, READ BACK BY AND VERIFIED WITH: J. GRIMSLEY,PHARMD [id number] 09/21/2019 T. TYSOR    Staphylococcus aureus (BCID) NOT DETECTED NOT  DETECTED Final   Methicillin resistance NOT DETECTED NOT DETECTED Final   Streptococcus species NOT DETECTED NOT DETECTED Final   Streptococcus agalactiae NOT DETECTED NOT DETECTED Final   Streptococcus  pneumoniae NOT DETECTED NOT DETECTED Final   Streptococcus pyogenes NOT DETECTED NOT DETECTED Final   Acinetobacter baumannii NOT DETECTED NOT DETECTED Final   Enterobacteriaceae species NOT DETECTED NOT DETECTED Final   Enterobacter cloacae complex NOT DETECTED NOT DETECTED Final   Escherichia coli NOT DETECTED NOT DETECTED Final   Klebsiella oxytoca NOT DETECTED NOT DETECTED Final   Klebsiella pneumoniae NOT DETECTED NOT DETECTED Final   Proteus species NOT DETECTED NOT DETECTED Final   Serratia marcescens NOT DETECTED NOT DETECTED Final   Haemophilus influenzae NOT DETECTED NOT DETECTED Final   Neisseria meningitidis NOT DETECTED NOT DETECTED Final   Pseudomonas aeruginosa NOT DETECTED NOT DETECTED Final   Candida albicans NOT DETECTED NOT DETECTED Final   Candida glabrata NOT DETECTED NOT DETECTED Final   Candida krusei NOT DETECTED NOT DETECTED Final   Candida parapsilosis NOT DETECTED NOT DETECTED Final   Candida tropicalis NOT DETECTED NOT DETECTED Final    Comment: Performed at [facility] [place] [facility], [place] [address], [place], [place] [address]    Radiology Reports CT ABDOMEN PELVIS WO CONTRAST  Result Date: 09/20/2019 CLINICAL DATA:  Nausea and vomiting EXAM: CT ABDOMEN AND PELVIS WITHOUT CONTRAST TECHNIQUE: Multidetector CT imaging of the abdomen and pelvis was performed following the standard protocol without IV contrast. COMPARISON:  CT 07/15/2019 FINDINGS: Lower chest: Small to moderate right pleural effusion with some adjacent passive atelectasis. Atelectatic changes present the left lung base as well. Normal heart size. No pericardial effusion. Atherosclerotic calcifications noted upon the coronary arteries as well as the included portions of the thoracic aorta. No pericardial effusion. Hepatobiliary: No visible liver lesions. Smooth liver surface contour. Normal hepatic attenuation. Mild distension of the gallbladder with dependently layering calcified gallstones towards the  gallbladder neck. No visible intraductal gallstones or biliary ductal dilatation is seen. Pancreas: Diffuse pancreatic atrophy. No concerning pancreatic lesions, a inflammation, or ductal dilatation. Spleen: Normal spleen. Small accessory splenule towards the hilum. Adrenals/Urinary Tract: Normal adrenal glands. Stable appearance of a fluid attenuation 3 cm cyst in the posterior right interpolar kidney no concerning renal mass. No urolithiasis or hydronephrosis. Urinary bladder is largely decompressed at the time of exam and therefore poorly evaluated by CT imaging. No gross bladder abnormality. Stomach/Bowel: Distal esophagus, stomach and duodenal sweep are unremarkable. No small bowel wall thickening or dilatation. No evidence of obstruction. Few sparse small bowel diverticula are noted no visible small bowel diverticular inflammation is seen however no proximal colonic wall thickening or dilatation. There is marked distal colonic inflammatory change extending in contiguous fashion from the level of the rectum through the distal sigmoid. Extensive stranding and inflammatory change noted in the perirectal and pericolonic fat. No free fluid or free air. No associated pneumatosis. Vascular/Lymphatic: Atherosclerotic calcifications throughout the abdominal aorta and branch vessels. No aneurysm or ectasia. No enlarged abdominopelvic lymph nodes. Reproductive: Normal appearance of the uterus and adnexal structures. Other: Inflammatory changes centered upon the rectosigmoid as above. Postsurgical changes noted in lateral left hip soft tissues. No abdominopelvic free air or fluid. No bowel containing hernia. Musculoskeletal: Multilevel degenerative changes are present in the imaged portions of the spine. Retrolisthesis of L2 on L3 and grade 1 anterolisthesis L4 on L5 is similar to prior. Surgical anchors noted in the right greater trochanter. Additional degenerative changes noted in both  hips. Sclerotic changes thickening  at the symphysis pubis are chronic and may reflect sequela of prior osteitis pubis. IMPRESSION: 1. Marked circumferential thickening and adjacent inflammation centered upon the rectum and distal sigmoid. No free fluid or free air. No associated pneumatosis. Differential considerations include infectious or inflammatory colitis versus less likely ischemic colitis. 2. Small to moderate right pleural effusion with some adjacent passive atelectasis. 3. Cholelithiasis without CT evidence of acute cholecystitis. 4. Aortic Atherosclerosis (ICD10-I70.0). Electronically Signed   By: Lovena Le M.D.   On: 09/20/2019 00:09   DG Lumbar Spine Complete  Result Date: 09/01/2019 CLINICAL DATA:  Hip pain after fall. EXAM: LUMBAR SPINE - COMPLETE 4+ VIEW COMPARISON:  July 14, 2019. FINDINGS: Mild grade 1 retrolisthesis of L1-2 and L2-3 is noted secondary to moderate degenerative disc disease at these levels. Mild grade 1 anterolisthesis of L4-5 is noted secondary to posterior facet joint hypertrophy. Severe degenerative disc disease is noted at L5-S1. No fracture is noted. Atherosclerosis of abdominal aorta is noted. IMPRESSION: Multilevel degenerative disc disease. No acute abnormality seen in the lumbar spine. Aortic Atherosclerosis (ICD10-I70.0). Electronically Signed   By: Marijo Conception M.D.   On: 09/01/2019 13:34   CT Head Wo Contrast  Result Date: 09/01/2019 CLINICAL DATA:  Unwitnessed fall EXAM: CT HEAD WITHOUT CONTRAST TECHNIQUE: Contiguous axial images were obtained from the base of the skull through the vertex without intravenous contrast. COMPARISON:  07/14/2019 FINDINGS: Brain: No evidence of acute infarction, hemorrhage, hydrocephalus, extra-axial collection or mass lesion/mass effect. Extensive low-density changes within the periventricular and subcortical white matter compatible with chronic microvascular ischemic change. Mild-moderate diffuse cerebral volume loss. Vascular: Atherosclerotic calcifications  involving the large vessels of the skull base. No unexpected hyperdense vessel. Skull: Normal. Negative for fracture or focal lesion. Sinuses/Orbits: No acute finding. Other: None. IMPRESSION: 1.  No acute intracranial findings. 2. Chronic microvascular ischemic change and cerebral volume loss, stable from prior. Electronically Signed   By: Davina Poke D.O.   On: 09/01/2019 14:15   DG Chest Port 1 View  Result Date: 09/19/2019 CLINICAL DATA:  Sepsis, nausea vomiting and diarrhea with decreased appetite since Thursday. EXAM: PORTABLE CHEST 1 VIEW COMPARISON:  Radiograph 07/14/2019 FINDINGS: There is some hazy opacity in the medial right lung base, increased from prior. Remaining portions the lungs are predominantly clear. No pneumothorax or effusion. No convincing features of edema. Postsurgical changes related to prior CABG including aligned sternotomy wires and multiple surgical clips projecting over the mediastinum. Inferior most sternal suture is fractured, similar to comparison. The aorta is calcified. The remaining cardiomediastinal contours are unremarkable. Implantable loop recorder is noted. No acute osseous or soft tissue abnormality. Degenerative changes are present in the imaged spine and shoulders. IMPRESSION: 1. Hazy opacity in the medial right lung base, increased from prior. Findings could reflect early consolidation or atelectasis. 2. Stable postoperative changes related to prior CABG. Inferior most sternal suture is fractured, similar to comparison. 3.  Aortic Atherosclerosis (ICD10-I70.0). Electronically Signed   By: Lovena Le M.D.   On: 09/19/2019 23:12   DG Hips Bilat W or Wo Pelvis 3-4 Views  Result Date: 09/01/2019 CLINICAL DATA:  Status post fall, left hip pain EXAM: DG HIP (WITH OR WITHOUT PELVIS) 3-4V BILAT COMPARISON:  None. FINDINGS: Generalized osteopenia. No acute fracture or dislocation. 2 bone anchors are present in the right greater trochanter likely from gluteus tendon  repair. Mild joint space narrowing of bilateral hips consistent with mild osteoarthritis. No aggressive osseous lesion. Lower  lumbar spine spondylosis. Mild osteoarthritis of bilateral SI joints. Degenerative changes of the pubic symphysis. IMPRESSION: No acute osseous injury of bilateral hips. Given the patient's age and osteopenia, if there is persistent clinical concern for an occult hip fracture, a MRI of the hip is recommended for increased sensitivity. Electronically Signed   By: Kathreen Devoid   On: 09/01/2019 13:33    Lab Data:  CBC: Recent Labs  Lab 09/19/19 2044 09/20/19 0537 09/21/19 0517  WBC 23.8* 19.2* 12.6*  HGB 13.1 11.9* 10.7*  HCT 40.1 37.1 33.5*  MCV 95.5 97.1 96.3  PLT 512* 423* 250   Basic Metabolic Panel: Recent Labs  Lab 09/19/19 2044 09/20/19 0537 09/21/19 0517  NA 142 141 139  K 4.4 4.3 3.6  CL 105 108 108  CO2 '22 22 23  ' GLUCOSE 158* 136* 120*  BUN 38* 44* 40*  CREATININE 2.35* 2.42* 1.47*  CALCIUM 9.0 8.5* 8.2*  MG 2.2  --   --   PHOS 5.9*  --   --    GFR: Estimated Creatinine Clearance: 23.9 mL/min (A) (by C-G formula based on SCr of 1.47 mg/dL (H)). Liver Function Tests: Recent Labs  Lab 09/19/19 2044 09/20/19 0537 09/21/19 0517  AST 44* 40 46*  ALT '26 24 22  ' ALKPHOS 145* 126 110  BILITOT 1.1 0.9 0.6  PROT 6.4* 5.5* 5.3*  ALBUMIN 3.1* 2.8* 2.5*   Recent Labs  Lab 09/19/19 2044  LIPASE 16   No results for input(s): AMMONIA in the last 168 hours. Coagulation Profile: Recent Labs  Lab 09/19/19 2318  INR 1.3*   Cardiac Enzymes: No results for input(s): CKTOTAL, CKMB, CKMBINDEX, TROPONINI in the last 168 hours. BNP (last 3 results) No results for input(s): PROBNP in the last 8760 hours. HbA1C: No results for input(s): HGBA1C in the last 72 hours. CBG: No results for input(s): GLUCAP in the last 168 hours. Lipid Profile: No results for input(s): CHOL, HDL, LDLCALC, TRIG, CHOLHDL, LDLDIRECT in the last 72 hours. Thyroid  Function Tests: No results for input(s): TSH, T4TOTAL, FREET4, T3FREE, THYROIDAB in the last 72 hours. Anemia Panel: No results for input(s): VITAMINB12, FOLATE, FERRITIN, TIBC, IRON, RETICCTPCT in the last 72 hours. Urine analysis:    Component Value Date/Time   COLORURINE YELLOW 09/19/2019 2044   APPEARANCEUR CLEAR 09/19/2019 2044   APPEARANCEUR Clear 01/27/2016 1115   LABSPEC 1.016 09/19/2019 2044   PHURINE 5.0 09/19/2019 2044   GLUCOSEU NEGATIVE 09/19/2019 2044   HGBUR NEGATIVE 09/19/2019 2044   BILIRUBINUR NEGATIVE 09/19/2019 2044   BILIRUBINUR Negative 01/27/2016 [place] 09/19/2019 2044   PROTEINUR NEGATIVE 09/19/2019 2044   NITRITE NEGATIVE 09/19/2019 2044   LEUKOCYTESUR NEGATIVE 09/19/2019 2044     Breeann Reposa M.D. [facility] 09/21/2019, 1:15 PM   Call night coverage person covering after 7pm

## 2019-09-21 NOTE — Progress Notes (Signed)
PHARMACY - PHYSICIAN COMMUNICATION CRITICAL VALUE ALERT - BLOOD CULTURE IDENTIFICATION (BCID)  [name] is an 86 y.o. female who presented to [facility] on 09/19/2019 with a chief complaint of Diarrhea, Emesis  Assessment:  Patient with BCID noted below (include suspected source if known)  Name of physician (or Provider) Contacted: X. Blount  Current antibiotics: Cefepime/Flagyl  Changes to prescribed antibiotics recommended:  Patient is on recommended antibiotics - No changes needed  Continue cefepime/flagyl for possible Intra-abdominal infection.  Results for orders placed or performed during the hospital encounter of 09/19/19  Blood Culture ID Panel (Reflexed) (Collected: 09/19/2019 11:19 PM)  Result Value Ref Range   Enterococcus species NOT DETECTED NOT DETECTED   Listeria monocytogenes NOT DETECTED NOT DETECTED   Staphylococcus species DETECTED (A) NOT DETECTED   Staphylococcus aureus (BCID) NOT DETECTED NOT DETECTED   Methicillin resistance NOT DETECTED NOT DETECTED   Streptococcus species NOT DETECTED NOT DETECTED   Streptococcus agalactiae NOT DETECTED NOT DETECTED   Streptococcus pneumoniae NOT DETECTED NOT DETECTED   Streptococcus pyogenes NOT DETECTED NOT DETECTED   Acinetobacter baumannii NOT DETECTED NOT DETECTED   Enterobacteriaceae species NOT DETECTED NOT DETECTED   Enterobacter cloacae complex NOT DETECTED NOT DETECTED   Escherichia coli NOT DETECTED NOT DETECTED   Klebsiella oxytoca NOT DETECTED NOT DETECTED   Klebsiella pneumoniae NOT DETECTED NOT DETECTED   Proteus species NOT DETECTED NOT DETECTED   Serratia marcescens NOT DETECTED NOT DETECTED   Haemophilus influenzae NOT DETECTED NOT DETECTED   Neisseria meningitidis NOT DETECTED NOT DETECTED   Pseudomonas aeruginosa NOT DETECTED NOT DETECTED   Candida albicans NOT DETECTED NOT DETECTED   Candida glabrata NOT DETECTED NOT DETECTED   Candida krusei NOT DETECTED NOT DETECTED   Candida parapsilosis  NOT DETECTED NOT DETECTED   Candida tropicalis NOT DETECTED NOT DETECTED    [name] 09/21/2019  1:58 AM

## 2019-09-21 NOTE — Progress Notes (Signed)
Patient has not had BM yet. Unable to provide stool sample.[name]

## 2019-09-22 DIAGNOSIS — F0390 Unspecified dementia without behavioral disturbance: Secondary | ICD-10-CM

## 2019-09-22 DIAGNOSIS — I25810 Atherosclerosis of coronary artery bypass graft(s) without angina pectoris: Secondary | ICD-10-CM

## 2019-09-22 DIAGNOSIS — R413 Other amnesia: Secondary | ICD-10-CM

## 2019-09-22 DIAGNOSIS — I10 Essential (primary) hypertension: Secondary | ICD-10-CM

## 2019-09-22 DIAGNOSIS — E785 Hyperlipidemia, unspecified: Secondary | ICD-10-CM

## 2019-09-22 LAB — CULTURE, BLOOD (ROUTINE X 2)

## 2019-09-22 LAB — COMPREHENSIVE METABOLIC PANEL
ALT: 20 U/L (ref 0–44)
AST: 36 U/L (ref 15–41)
Albumin: 2.4 g/dL — ABNORMAL LOW (ref 3.5–5.0)
Alkaline Phosphatase: 96 U/L (ref 38–126)
Anion gap: 8 (ref 5–15)
BUN: 24 mg/dL — ABNORMAL HIGH (ref 8–23)
CO2: 20 mmol/L — ABNORMAL LOW (ref 22–32)
Calcium: 8 mg/dL — ABNORMAL LOW (ref 8.9–10.3)
Chloride: 111 mmol/L (ref 98–111)
Creatinine, Ser: 0.94 mg/dL (ref 0.44–1.00)
GFR calc Af Amer: >60 mL/min (ref >60)
GFR calc non Af Amer: 55 mL/min — ABNORMAL LOW (ref >60)
Glucose, Bld: 141 mg/dL — ABNORMAL HIGH (ref 70–99)
Potassium: 3.2 mmol/L — ABNORMAL LOW (ref 3.5–5.1)
Sodium: 139 mmol/L (ref 135–145)
Total Bilirubin: 0.6 mg/dL (ref 0.3–1.2)
Total Protein: 5 g/dL — ABNORMAL LOW (ref 6.5–8.1)

## 2019-09-22 LAB — BLOOD GAS, ARTERIAL
Acid-base deficit: 1.8 mmol/L (ref 0.0–2.0)
Bicarbonate: 20.4 mmol/L (ref 20.0–28.0)
Drawn by: 23281
FIO2: 21
O2 Saturation: 98.3 %
Patient temperature: 99.9
pCO2 arterial: 28.3 mmHg — ABNORMAL LOW (ref 32.0–48.0)
pH, Arterial: 7.474 — ABNORMAL HIGH (ref 7.350–7.450)
pO2, Arterial: 102 mmHg (ref 83.0–108.0)

## 2019-09-22 LAB — GLUCOSE, CAPILLARY: Glucose-Capillary: 117 mg/dL — ABNORMAL HIGH (ref 70–99)

## 2019-09-22 LAB — CBC
HCT: 33.5 % — ABNORMAL LOW (ref 36.0–46.0)
Hemoglobin: 11 g/dL — ABNORMAL LOW (ref 12.0–15.0)
MCH: 31.1 pg (ref 26.0–34.0)
MCHC: 32.8 g/dL (ref 30.0–36.0)
MCV: 94.6 fL (ref 80.0–100.0)
Platelets: 323 10*3/uL (ref 150–400)
RBC: 3.54 MIL/uL — ABNORMAL LOW (ref 3.87–5.11)
RDW: 12.7 % (ref 11.5–15.5)
WBC: 12.2 10*3/uL — ABNORMAL HIGH (ref 4.0–10.5)
nRBC: 0 % (ref 0.0–0.2)

## 2019-09-22 LAB — BRAIN NATRIURETIC PEPTIDE: B Natriuretic Peptide: 433.4 pg/mL — ABNORMAL HIGH (ref 0.0–100.0)

## 2019-09-22 MED ORDER — POTASSIUM CHLORIDE 10 MEQ/100ML IV SOLN
10.0000 meq | INTRAVENOUS | Status: AC
  Administered 2019-09-22 (×3): 10 meq via INTRAVENOUS
  Filled 2019-09-22 (×3): qty 100

## 2019-09-22 MED ORDER — SODIUM CHLORIDE 0.9 % IV SOLN
2.0000 g | Freq: Two times a day (BID) | INTRAVENOUS | Status: DC
  Administered 2019-09-22: 2 g via INTRAVENOUS
  Filled 2019-09-22: qty 2

## 2019-09-22 MED ORDER — VANCOMYCIN HCL 750 MG/150ML IV SOLN
750.0000 mg | INTRAVENOUS | Status: AC
  Administered 2019-09-23 – 2019-10-01 (×9): 750 mg via INTRAVENOUS
  Filled 2019-09-22 (×9): qty 150

## 2019-09-22 MED ORDER — DEXTROSE-NACL 5-0.45 % IV SOLN: INTRAVENOUS | Status: AC

## 2019-09-22 MED ORDER — BOOST / RESOURCE BREEZE PO LIQD CUSTOM
1.0000 | Freq: Three times a day (TID) | ORAL | Status: DC
  Administered 2019-09-25 – 2019-09-30 (×12): 1 via ORAL

## 2019-09-22 MED ORDER — DEXTROSE-NACL 5-0.45 % IV SOLN
INTRAVENOUS | Status: AC
  Administered 2019-09-22: 1000 mL via INTRAVENOUS

## 2019-09-22 MED ORDER — CHLORHEXIDINE GLUCONATE 0.12 % MT SOLN
15.0000 mL | Freq: Two times a day (BID) | OROMUCOSAL | Status: DC
  Administered 2019-09-23 – 2019-10-01 (×15): 15 mL via OROMUCOSAL
  Filled 2019-09-22 (×12): qty 15

## 2019-09-22 MED ORDER — ORAL CARE MOUTH RINSE
15.0000 mL | Freq: Two times a day (BID) | OROMUCOSAL | Status: DC
  Administered 2019-09-25 – 2019-10-01 (×8): 15 mL via OROMUCOSAL

## 2019-09-22 MED ORDER — VANCOMYCIN HCL IN DEXTROSE 1-5 GM/200ML-% IV SOLN
1000.0000 mg | INTRAVENOUS | Status: AC
  Administered 2019-09-22: 1000 mg via INTRAVENOUS
  Filled 2019-09-22: qty 200

## 2019-09-22 MED ORDER — ATENOLOL 25 MG PO TABS
25.0000 mg | ORAL_TABLET | Freq: Every day | ORAL | Status: DC
  Administered 2019-09-22 – 2019-10-01 (×5): 25 mg via ORAL
  Filled 2019-09-22 (×6): qty 1

## 2019-09-22 NOTE — Progress Notes (Signed)
Patient having difficulty swallowing pills. Dr Tana Coast notified of this.

## 2019-09-22 NOTE — Progress Notes (Signed)
[facility]                                                                              Patient Demographics  [name], is a 86 y.o. female, DOB - [birth year], HUD:[id number]  Admit date - 09/19/2019   Admitting Physician Chauncey Mann, MD  Outpatient Primary MD for the patient is Carol Ada, MD  Outpatient specialists:   LOS - 2  days   Medical records reviewed and are as summarized below:    Chief Complaint  Patient presents with   Diarrhea   Emesis       Brief summary   Patient is 86 year old female with history of hypertension, hyperlipidemia, CAD, CVA, dementia presented with diarrhea, vomiting, was admitted for sepsis from presumed colitis Patient had reported vomiting and diarrhea for 2 days, unable to eat or drink anything on the day of admission, possible complaining of rectal soreness per granddaughter.  At baseline, normally able to walk with assistance, eat and usually fairly alert and awake.  Over last 24 hours prior to admission, she had been less responsive.  Per EMS, was initially hypotensive 90/50, improved with IV fluid bolus. In ED, lipase within normal limits, creatinine 2.35, alk phos 145, WBCs 23.8 lactate 2.1.  UA negative Chest x-ray hazy opacity in medial right lung base, early consolidation or atelectasis.  CT abdomen pelvis showed marked circumferential thickening and adjacent inflammation centered upon the rectum and distal sigmoid no associated pneumatosis, free air.  Differentials including infectious or inflammatory colitis.   Assessment & Plan    Principal Problem:   Sepsis ([place]), acute infectious colitis, staph hominis bacteremia - Patient met sepsis criteria at the time of admission with acute kidney injury, leukocytosis, hypotensive, lactic acidosis - C. difficile, GI pathogen panel, BC ID staph possibly contaminant -Vancomycin was initiated on admission, BC ID possibly contaminant however now showing staph  hominis .  Reinitiated vancomycin.   -  Continue IV cefepime and Flagyl, infectious disease consult obtained, discussed with Dr. Linus Salmons. - Continue soft solid diet - repeat blood culture, await ID recommendations  Acute kidney injury -Creatinine 2.35 at the time of admission, baseline 0.73 on 07/17/2019 in the setting of #1, hypovolemia, ACE inhibitor -Creatinine now improved, 0.9  Hypotension  -BP now stable, restart atenolol 25 mg daily  Coronary artery disease -Restart beta-blocker -Continue Plavix, full liquid diet  Dementia with delirium/acute encephalopathy - At baseline, is alert, can walk with assistance, eat and alert - Patient has not started Aricept, Namenda, will continue to hold - Overnight issues noted, today much more somnolent, likely due to bacteremia   Code Status: Full CODE STATUS DVT Prophylaxis:   SCD's Family Communication: Discussed all imaging results, lab results, explained to the patient's grandaughter, Christine.    Disposition Plan:     Status is: Inpatient  Remains inpatient appropriate because:Inpatient level of care appropriate due to severity of illness, creatinine improving but much more somnolent and lethargic today    Dispo: The patient is from: Home              Anticipated d/c is to: Home  Anticipated d/c date is: 2 days              Patient currently is not medically stable to d/c.       Time Spent in minutes 25 minutes  Procedures:  CT abdomen  Consultants:   None  Antimicrobials:   Anti-infectives (From admission, onward)   Start     Dose/Rate Route Frequency Ordered Stop   09/23/19 1200  vancomycin (VANCOREADY) IVPB 750 mg/150 mL     750 mg 150 mL/hr over 60 Minutes Intravenous Every 24 hours 09/22/19 1106     09/22/19 1130  vancomycin (VANCOCIN) IVPB 1000 mg/200 mL premix     1,000 mg 200 mL/hr over 60 Minutes Intravenous STAT 09/22/19 1105 09/22/19 1334   09/22/19 1000  ceFEPIme (MAXIPIME) 2 g in sodium  chloride 0.9 % 100 mL IVPB     2 g 200 mL/hr over 30 Minutes Intravenous Every 12 hours 09/22/19 0838     09/21/19 1200  vancomycin (VANCOREADY) IVPB 500 mg/100 mL  Status:  Discontinued     500 mg 100 mL/hr over 60 Minutes Intravenous Every 36 hours 09/20/19 0449 09/20/19 0941   09/20/19 2200  ceFEPIme (MAXIPIME) 2 g in sodium chloride 0.9 % 100 mL IVPB  Status:  Discontinued     2 g 200 mL/hr over 30 Minutes Intravenous Every 24 hours 09/20/19 0449 09/22/19 0838   09/20/19 0800  metroNIDAZOLE (FLAGYL) IVPB 500 mg     500 mg 100 mL/hr over 60 Minutes Intravenous Every 8 hours 09/20/19 0202     09/19/19 2245  ceFEPIme (MAXIPIME) 2 g in sodium chloride 0.9 % 100 mL IVPB     2 g 200 mL/hr over 30 Minutes Intravenous  Once 09/19/19 2236 09/19/19 2357   09/19/19 2245  metroNIDAZOLE (FLAGYL) IVPB 500 mg     500 mg 100 mL/hr over 60 Minutes Intravenous  Once 09/19/19 2236 09/20/19 0102   09/19/19 2245  vancomycin (VANCOCIN) IVPB 1000 mg/200 mL premix     1,000 mg 200 mL/hr over 60 Minutes Intravenous  Once 09/19/19 2236 09/20/19 0206         Medications  Scheduled Meds:  brimonidine  1 drop Left Eye BID   clopidogrel  75 mg Oral Daily   dorzolamide-timolol  1 drop Left Eye BID   feeding supplement  1 Container Oral TID BM   heparin  5,000 Units Subcutaneous Q8H   latanoprost  1 drop Both Eyes QHS   Continuous Infusions:  ceFEPime (MAXIPIME) IV 2 g (09/22/19 1059)   dextrose 5 % and 0.45% NaCl 1,000 mL (09/22/19 1104)   metronidazole 500 mg (09/22/19 0856)   [START ON 09/23/2019] vancomycin     PRN Meds:.mirtazapine      Subjective:   [name] was seen and examined today.  Overnight issues noted, very somnolent and lethargic, difficult to arouse.  No fevers, diarrhea, pain.  Difficult to obtain review of system from the patient.  Objective:   Vitals:   09/22/19 0550 09/22/19 0605 09/22/19 0929 09/22/19 1333  BP: (!) 125/51  (!) 139/56 (!) 165/79    Pulse: 79 76 76 80  Resp: (!) 26 (!) '27 20 17  ' Temp: 99.2 F (37.3 C)  98.6 F (37 C) 99.2 F (37.3 C)  TempSrc: Rectal  Oral Oral  SpO2: 95% 97% 96% 98%  Weight:        Intake/Output Summary (Last 24 hours) at 09/22/2019 1353 Last data filed at 09/22/2019 3810 Gross  per 24 hour  Intake 1398.8 ml  Output --  Net 1398.8 ml     Wt Readings from Last 3 Encounters:  09/20/19 55 kg  07/15/19 55 kg  07/14/19 55 kg   Physical Exam  General: Somnolent, lethargic  Cardiovascular: S1 S2 clear, RRR. No pedal edema b/l  Respiratory: CTAB, no wheezing, rales or rhonchi  Gastrointestinal: Soft, nontender, nondistended, NBS  Ext: no pedal edema bilaterally  Neuro: no new deficits  Musculoskeletal: No cyanosis, clubbing  Skin: No rashes  Psych: Somnolent, not following verbal commands      Data Reviewed:  I have personally reviewed following labs and imaging studies  Micro Results Recent Results (from the past 240 hour(s))  Urine culture     Status: None   Collection Time: 09/19/19 10:23 PM   Specimen: Urine, Clean Catch  Result Value Ref Range Status   Specimen Description   Final    URINE, CLEAN CATCH Performed at [facility] [address] [place], [place] [address]    Special Requests   Final    NONE Performed at [facility], [place] [address] [place], [place] [address]    Culture   Final    NO GROWTH Performed at [facility], [place] [address] [place], [place] [address]    Report Status 09/21/2019 FINAL  Final  Blood Culture (routine x 2)     Status: Abnormal   Collection Time: 09/19/19 11:18 PM   Specimen: BLOOD  Result Value Ref Range Status   Specimen Description   Final    BLOOD Performed at [facility] [address] [place], [place] [address]    Special Requests   Final    NONE Performed at [facility], [place] [address], [place], [place] [address]     Culture  Setup Time   Final    GRAM POSITIVE COCCI IN CLUSTERS AEROBIC BOTTLE ONLY CRITICAL RESULT CALLED TO, READ BACK BY AND VERIFIED WITH: Audrea Muscat [id number] 09/21/2019 Mena Goes Performed at [facility], [place] [address] [place], [place] [address]    Culture STAPHYLOCOCCUS HOMINIS (A)  Final   Report Status 09/22/2019 FINAL  Final  Blood Culture (routine x 2)     Status: Abnormal   Collection Time: 09/19/19 11:19 PM   Specimen: BLOOD  Result Value Ref Range Status   Specimen Description   Final    BLOOD Performed at [employer] [address], [place], [place] [address]    Special Requests   Final    NONE Performed at [facility], [place] [address], [place], [place] [address]    Culture  Setup Time   Final    GRAM POSITIVE COCCI IN CLUSTERS AEROBIC BOTTLE ONLY CRITICAL RESULT CALLED TO, READ BACK BY AND VERIFIED WITH: Audrea Muscat [id number] 09/21/2019 Mena Goes Performed at [facility], [place] [address] [place],  [address]    Culture STAPHYLOCOCCUS HOMINIS (A)  Final   Report Status 09/22/2019 FINAL  Final   Organism ID, Bacteria STAPHYLOCOCCUS HOMINIS  Final      Susceptibility   Staphylococcus hominis - MIC*    CIPROFLOXACIN <=0.5 SENSITIVE Sensitive     ERYTHROMYCIN >=8 RESISTANT Resistant     GENTAMICIN <=0.5 SENSITIVE Sensitive     OXACILLIN RESISTANT Resistant     TETRACYCLINE <=1 SENSITIVE Sensitive     VANCOMYCIN <=0.5 SENSITIVE Sensitive     TRIMETH/SULFA <=10 SENSITIVE  Sensitive     CLINDAMYCIN <=0.25 SENSITIVE Sensitive     RIFAMPIN <=0.5 SENSITIVE Sensitive     Inducible Clindamycin NEGATIVE Sensitive     * STAPHYLOCOCCUS HOMINIS  SARS Coronavirus 2 by RT PCR (hospital order, performed in [facility] hospital lab) Nasopharyngeal Nasopharyngeal Swab     Status: None   Collection Time: 09/19/19 11:19 PM   Specimen: Nasopharyngeal Swab  Result Value Ref Range Status   SARS Coronavirus 2 NEGATIVE  NEGATIVE Final    Comment: Performed at [facility], [place] [address] [place], [place] [address]  Blood Culture ID Panel (Reflexed)     Status: Abnormal   Collection Time: 09/19/19 11:19 PM  Result Value Ref Range Status   Enterococcus species NOT DETECTED NOT DETECTED Final   Listeria monocytogenes NOT DETECTED NOT DETECTED Final   Staphylococcus species DETECTED (A) NOT DETECTED Final    Comment: Methicillin (oxacillin) susceptible coagulase negative staphylococcus. Possible blood culture contaminant (unless isolated from more than one blood culture draw or clinical case suggests pathogenicity). No antibiotic treatment is indicated for blood  culture contaminants. CRITICAL RESULT CALLED TO, READ BACK BY AND VERIFIED WITH: J. GRIMSLEY,PHARMD 0113 09/21/2019 T. TYSOR    Staphylococcus aureus (BCID) NOT DETECTED NOT DETECTED Final   Methicillin resistance NOT DETECTED NOT DETECTED Final   Streptococcus species NOT DETECTED NOT DETECTED Final   Streptococcus agalactiae NOT DETECTED NOT DETECTED Final   Streptococcus pneumoniae NOT DETECTED NOT DETECTED Final   Streptococcus pyogenes NOT DETECTED NOT DETECTED Final   Acinetobacter baumannii NOT DETECTED NOT DETECTED Final   Enterobacteriaceae species NOT DETECTED NOT DETECTED Final   Enterobacter cloacae complex NOT DETECTED NOT DETECTED Final   Escherichia coli NOT DETECTED NOT DETECTED Final   Klebsiella oxytoca NOT DETECTED NOT DETECTED Final   Klebsiella pneumoniae NOT DETECTED NOT DETECTED Final   Proteus species NOT DETECTED NOT DETECTED Final   Serratia marcescens NOT DETECTED NOT DETECTED Final   Haemophilus influenzae NOT DETECTED NOT DETECTED Final   Neisseria meningitidis NOT DETECTED NOT DETECTED Final   Pseudomonas aeruginosa NOT DETECTED NOT DETECTED Final   Candida albicans NOT DETECTED NOT DETECTED Final   Candida glabrata NOT DETECTED NOT DETECTED Final   Candida krusei NOT DETECTED NOT DETECTED Final    Candida parapsilosis NOT DETECTED NOT DETECTED Final   Candida tropicalis NOT DETECTED NOT DETECTED Final    Comment: Performed at [facility], [address] [place], [place] [address]    Radiology Reports CT ABDOMEN PELVIS WO CONTRAST  Result Date: 09/20/2019 CLINICAL DATA:  Nausea and vomiting EXAM: CT ABDOMEN AND PELVIS WITHOUT CONTRAST TECHNIQUE: Multidetector CT imaging of the abdomen and pelvis was performed following the standard protocol without IV contrast. COMPARISON:  CT 07/15/2019 FINDINGS: Lower chest: Small to moderate right pleural effusion with some adjacent passive atelectasis. Atelectatic changes present the left lung base as well. Normal heart size. No pericardial effusion. Atherosclerotic calcifications noted upon the coronary arteries as well as the included portions of the thoracic aorta. No pericardial effusion. Hepatobiliary: No visible liver lesions. Smooth liver surface contour. Normal hepatic attenuation. Mild distension of the gallbladder with dependently layering calcified gallstones towards the gallbladder neck. No visible intraductal gallstones or biliary ductal dilatation is seen. Pancreas: Diffuse pancreatic atrophy. No concerning pancreatic lesions, a inflammation, or ductal dilatation. Spleen: Normal spleen. Small accessory splenule towards the hilum. Adrenals/Urinary Tract: Normal adrenal glands. Stable appearance of a fluid attenuation 3 cm cyst in the posterior right interpolar  kidney no concerning renal mass. No urolithiasis or hydronephrosis. Urinary bladder is largely decompressed at the time of exam and therefore poorly evaluated by CT imaging. No gross bladder abnormality. Stomach/Bowel: Distal esophagus, stomach and duodenal sweep are unremarkable. No small bowel wall thickening or dilatation. No evidence of obstruction. Few sparse small bowel diverticula are noted no visible small bowel diverticular inflammation is seen however no proximal colonic wall  thickening or dilatation. There is marked distal colonic inflammatory change extending in contiguous fashion from the level of the rectum through the distal sigmoid. Extensive stranding and inflammatory change noted in the perirectal and pericolonic fat. No free fluid or free air. No associated pneumatosis. Vascular/Lymphatic: Atherosclerotic calcifications throughout the abdominal aorta and branch vessels. No aneurysm or ectasia. No enlarged abdominopelvic lymph nodes. Reproductive: Normal appearance of the uterus and adnexal structures. Other: Inflammatory changes centered upon the rectosigmoid as above. Postsurgical changes noted in lateral left hip soft tissues. No abdominopelvic free air or fluid. No bowel containing hernia. Musculoskeletal: Multilevel degenerative changes are present in the imaged portions of the spine. Retrolisthesis of L2 on L3 and grade 1 anterolisthesis L4 on L5 is similar to prior. Surgical anchors noted in the right greater trochanter. Additional degenerative changes noted in both hips. Sclerotic changes thickening at the symphysis pubis are chronic and may reflect sequela of prior osteitis pubis. IMPRESSION: 1. Marked circumferential thickening and adjacent inflammation centered upon the rectum and distal sigmoid. No free fluid or free air. No associated pneumatosis. Differential considerations include infectious or inflammatory colitis versus less likely ischemic colitis. 2. Small to moderate right pleural effusion with some adjacent passive atelectasis. 3. Cholelithiasis without CT evidence of acute cholecystitis. 4. Aortic Atherosclerosis (ICD10-I70.0). Electronically Signed   By: Lovena Le M.D.   On: 09/20/2019 00:09   DG Lumbar Spine Complete  Result Date: 09/01/2019 CLINICAL DATA:  Hip pain after fall. EXAM: LUMBAR SPINE - COMPLETE 4+ VIEW COMPARISON:  July 14, 2019. FINDINGS: Mild grade 1 retrolisthesis of L1-2 and L2-3 is noted secondary to moderate degenerative disc  disease at these levels. Mild grade 1 anterolisthesis of L4-5 is noted secondary to posterior facet joint hypertrophy. Severe degenerative disc disease is noted at L5-S1. No fracture is noted. Atherosclerosis of abdominal aorta is noted. IMPRESSION: Multilevel degenerative disc disease. No acute abnormality seen in the lumbar spine. Aortic Atherosclerosis (ICD10-I70.0). Electronically Signed   By: Marijo Conception M.D.   On: 09/01/2019 13:34   CT Head Wo Contrast  Result Date: 09/01/2019 CLINICAL DATA:  Unwitnessed fall EXAM: CT HEAD WITHOUT CONTRAST TECHNIQUE: Contiguous axial images were obtained from the base of the skull through the vertex without intravenous contrast. COMPARISON:  07/14/2019 FINDINGS: Brain: No evidence of acute infarction, hemorrhage, hydrocephalus, extra-axial collection or mass lesion/mass effect. Extensive low-density changes within the periventricular and subcortical white matter compatible with chronic microvascular ischemic change. Mild-moderate diffuse cerebral volume loss. Vascular: Atherosclerotic calcifications involving the large vessels of the skull base. No unexpected hyperdense vessel. Skull: Normal. Negative for fracture or focal lesion. Sinuses/Orbits: No acute finding. Other: None. IMPRESSION: 1.  No acute intracranial findings. 2. Chronic microvascular ischemic change and cerebral volume loss, stable from prior. Electronically Signed   By: Davina Poke D.O.   On: 09/01/2019 14:15   DG Chest Port 1 View  Result Date: 09/19/2019 CLINICAL DATA:  Sepsis, nausea vomiting and diarrhea with decreased appetite since Thursday. EXAM: PORTABLE CHEST 1 VIEW COMPARISON:  Radiograph 07/14/2019 FINDINGS: There is some hazy opacity in the  medial right lung base, increased from prior. Remaining portions the lungs are predominantly clear. No pneumothorax or effusion. No convincing features of edema. Postsurgical changes related to prior CABG including aligned sternotomy wires and  multiple surgical clips projecting over the mediastinum. Inferior most sternal suture is fractured, similar to comparison. The aorta is calcified. The remaining cardiomediastinal contours are unremarkable. Implantable loop recorder is noted. No acute osseous or soft tissue abnormality. Degenerative changes are present in the imaged spine and shoulders. IMPRESSION: 1. Hazy opacity in the medial right lung base, increased from prior. Findings could reflect early consolidation or atelectasis. 2. Stable postoperative changes related to prior CABG. Inferior most sternal suture is fractured, similar to comparison. 3.  Aortic Atherosclerosis (ICD10-I70.0). Electronically Signed   By: Lovena Le M.D.   On: 09/19/2019 23:12   DG Hips Bilat W or Wo Pelvis 3-4 Views  Result Date: 09/01/2019 CLINICAL DATA:  Status post fall, left hip pain EXAM: DG HIP (WITH OR WITHOUT PELVIS) 3-4V BILAT COMPARISON:  None. FINDINGS: Generalized osteopenia. No acute fracture or dislocation. 2 bone anchors are present in the right greater trochanter likely from gluteus tendon repair. Mild joint space narrowing of bilateral hips consistent with mild osteoarthritis. No aggressive osseous lesion. Lower lumbar spine spondylosis. Mild osteoarthritis of bilateral SI joints. Degenerative changes of the pubic symphysis. IMPRESSION: No acute osseous injury of bilateral hips. Given the patient's age and osteopenia, if there is persistent clinical concern for an occult hip fracture, a MRI of the hip is recommended for increased sensitivity. Electronically Signed   By: Kathreen Devoid   On: 09/01/2019 13:33    Lab Data:  CBC: Recent Labs  Lab 09/19/19 2044 09/20/19 0537 09/21/19 0517 09/22/19 0457  WBC 23.8* 19.2* 12.6* 12.2*  HGB 13.1 11.9* 10.7* 11.0*  HCT 40.1 37.1 33.5* 33.5*  MCV 95.5 97.1 96.3 94.6  PLT 512* 423* 361 832   Basic Metabolic Panel: Recent Labs  Lab 09/19/19 2044 09/20/19 0537 09/21/19 0517 09/22/19 0457  NA 142  141 139 139  K 4.4 4.3 3.6 3.2*  CL 105 108 108 111  CO2 '22 22 23 ' 20*  GLUCOSE 158* 136* 120* 141*  BUN 38* 44* 40* 24*  CREATININE 2.35* 2.42* 1.47* 0.94  CALCIUM 9.0 8.5* 8.2* 8.0*  MG 2.2  --   --   --   PHOS 5.9*  --   --   --    GFR: Estimated Creatinine Clearance: 37.3 mL/min (by C-G formula based on SCr of 0.94 mg/dL). Liver Function Tests: Recent Labs  Lab 09/19/19 2044 09/20/19 0537 09/21/19 0517 09/22/19 0457  AST 44* 40 46* 36  ALT '26 24 22 20  ' ALKPHOS 145* 126 110 96  BILITOT 1.1 0.9 0.6 0.6  PROT 6.4* 5.5* 5.3* 5.0*  ALBUMIN 3.1* 2.8* 2.5* 2.4*   Recent Labs  Lab 09/19/19 2044  LIPASE 16   No results for input(s): AMMONIA in the last 168 hours. Coagulation Profile: Recent Labs  Lab 09/19/19 2318  INR 1.3*   Cardiac Enzymes: No results for input(s): CKTOTAL, CKMB, CKMBINDEX, TROPONINI in the last 168 hours. BNP (last 3 results) No results for input(s): PROBNP in the last 8760 hours. HbA1C: No results for input(s): HGBA1C in the last 72 hours. CBG: Recent Labs  Lab 09/22/19 0504  GLUCAP 117*   Lipid Profile: No results for input(s): CHOL, HDL, LDLCALC, TRIG, CHOLHDL, LDLDIRECT in the last 72 hours. Thyroid Function Tests: No results for input(s): TSH, T4TOTAL, FREET4, T3FREE,  THYROIDAB in the last 72 hours. Anemia Panel: No results for input(s): VITAMINB12, FOLATE, FERRITIN, TIBC, IRON, RETICCTPCT in the last 72 hours. Urine analysis:    Component Value Date/Time   COLORURINE YELLOW 09/19/2019 2044   APPEARANCEUR CLEAR 09/19/2019 2044   APPEARANCEUR Clear 01/27/2016 1115   LABSPEC 1.016 09/19/2019 2044   PHURINE 5.0 09/19/2019 2044   GLUCOSEU NEGATIVE 09/19/2019 2044   HGBUR NEGATIVE 09/19/2019 2044   BILIRUBINUR NEGATIVE 09/19/2019 2044   BILIRUBINUR Negative 01/27/2016 [place] 09/19/2019 2044   PROTEINUR NEGATIVE 09/19/2019 2044   NITRITE NEGATIVE 09/19/2019 2044   LEUKOCYTESUR NEGATIVE 09/19/2019 2044      Nolyn Swab M.D. [facility] 09/22/2019, 1:53 PM   Call night coverage person covering after 7pm

## 2019-09-22 NOTE — Evaluation (Signed)
Physical Therapy Evaluation Patient Details Name: [name] MRN: [id number] DOB: 12-31-1931 Today's Date: 09/22/2019   History of Present Illness  86 year old female with history of hypertension, hyperlipidemia, CAD, CVA, dementia presented with diarrhea, vomiting, was admitted for sepsis from presumed colitis  Clinical Impression  Pt admitted with above diagnosis.  Pt currently with functional limitations due to the deficits listed below (see PT Problem List). Pt will benefit from skilled PT to increase their independence and safety with mobility to allow discharge to the venue listed below.  Limited evaluation due to cognition.  Pt kept eyes closed and had occasional grimacing however no verbalizations or attempts to communicate nonverbally.  Pt allowed PROM of extremities however resistant to attempting to assist trunk off bed.  Pt typically ambulatory with assist per chart review.  Pt may need SNF upon d/c.       Follow Up Recommendations SNF    Equipment Recommendations  None recommended by PT    Recommendations for Other Services       Precautions / Restrictions Precautions Precautions: Fall      Mobility  Bed Mobility Overal bed mobility: Needs Assistance             General bed mobility comments: attempted to only raise trunk from [facility] however pt was resistant to movement; maintained closed eyes  Transfers                    Ambulation/Gait                Stairs            Wheelchair Mobility    Modified Rankin (Stroke Patients Only)       Balance                                             Pertinent Vitals/Pain Pain Assessment: Faces Faces Pain Scale: Hurts little more Pain Location: movement of L LE and L UE Pain Descriptors / Indicators: Grimacing Pain Intervention(s): Repositioned;Monitored during session    Home Living Family/patient expects to be discharged to:: Private residence Living  Arrangements: Other relatives(granddaughter) Available Help at Discharge: Available PRN/intermittently Type of Home: House Home Access: Level entry     Home Layout: Two level;Able to live on main level with bedroom/bathroom Home Equipment: Shower seat;Grab bars - tub/shower Additional Comments: above per previous admission, uncertain of accuracy; from home with granddaughter per chart review    Prior Function Level of Independence: Independent with assistive device(s)         Comments: ambulatory with assist at baseline per chart review     Hand Dominance        Extremity/Trunk Assessment   Upper Extremity Assessment Upper Extremity Assessment: Difficult to assess due to impaired cognition    Lower Extremity Assessment Lower Extremity Assessment: Difficult to assess due to impaired cognition;Generalized weakness(observed increased resting bil PF in ankles)       Communication   Communication: HOH  Cognition Arousal/Alertness: Lethargic   Overall Cognitive Status: No family/caregiver present to determine baseline cognitive functioning                                 General Comments: hx of dementia, typically more awake and alert per chart review, pt kept eyes closed today,  occasional grimacing, no verbalizations      General Comments      Exercises     Assessment/Plan    PT Assessment Patient needs continued PT services  PT Problem List Decreased strength;Decreased mobility;Decreased activity tolerance;Decreased balance;Decreased knowledge of use of DME;Decreased cognition       PT Treatment Interventions DME instruction;Therapeutic activities;Gait training;Therapeutic exercise;Patient/family education;Functional mobility training;Balance training;Wheelchair mobility training    PT Goals (Current goals can be found in the Care Plan section)  Acute Rehab PT Goals PT Goal Formulation: Patient unable to participate in goal setting Time For Goal  Achievement: 10/06/19 Potential to Achieve Goals: Fair    Frequency Min 2X/week   Barriers to discharge        Co-evaluation               AM-PAC PT "6 Clicks" Mobility  Outcome Measure Help needed turning from your back to your side while in a flat bed without using bedrails?: Total Help needed moving from lying on your back to sitting on the side of a flat bed without using bedrails?: Total Help needed moving to and from a bed to a chair (including a wheelchair)?: Total Help needed standing up from a chair using your arms (e.g., wheelchair or bedside chair)?: Total Help needed to walk in hospital room?: Total Help needed climbing 3-5 steps with a railing? : Total 6 Click Score: 6    End of Session   Activity Tolerance: Patient limited by fatigue Patient left: in bed;with call bell/phone within reach;with bed alarm set;with nursing/sitter in room Nurse Communication: Mobility status PT Visit Diagnosis: Difficulty in walking, not elsewhere classified (R26.2);Adult, failure to thrive (R62.7)    Time: [id number] PT Time Calculation (min) (ACUTE ONLY): 25 min   Charges:   PT Evaluation $PT Eval Low Complexity: 1 Low     Kati PT, DPT Acute Rehabilitation Services Office: [phone]  York Ram E 09/22/2019, 3:50 PM

## 2019-09-22 NOTE — Progress Notes (Signed)
Initial Nutrition Assessment  INTERVENTION:   -Boost Breeze po TID, each supplement provides 250 kcal and 9 grams of protein -Multivitamin with minerals daily  NUTRITION DIAGNOSIS:   Increased nutrient needs related to wound healing as evidenced by estimated needs.  GOAL:   Patient will meet greater than or equal to 90% of their needs  MONITOR:   Supplement acceptance, Weight trends, PO intake, Labs, I & O's, Skin  REASON FOR ASSESSMENT:   Other (Comment)(Wound)    ASSESSMENT:   86 year old female with history of hypertension, hyperlipidemia, CAD, CVA, dementia presented with diarrhea, vomiting, was admitted for sepsis from presumed colitisPatient had reported vomiting and diarrhea for 2 days, unable to eat or drink anything on the day of admission, possible complaining of rectal soreness per granddaughter.  Admitted for sepsis, acute infectious colitis.  **RD working remotely**  Patient with dementia, alert/oriented x 1. Pt s/p rapid response this morning. Now stable. Pt admitted 5/22. Per chart review, pt was having diarrhea and vomiting. Pt was unable to tolerate any food or drink the day of admission. Pt typically eats with no issues.  Will order Boost Breeze and daily MVI.  Admission weight: 121 lbs. Per weight records, pt's weight has been stable since March 2020.  I/Os: +2.7L since admit  Medications: IV KCl Labs reviewed: CBGs: 117 Low K  NUTRITION - FOCUSED PHYSICAL EXAM:  RD working remotely.  Diet Order:   Diet Order            DIET SOFT Room service appropriate? Yes; Fluid consistency: Thin  Diet effective 1400              EDUCATION NEEDS:   Not appropriate for education at this time  Skin:  Skin Assessment: Skin Integrity Issues: Skin Integrity Issues:: Stage II Stage II: mid sacrum  Last BM:  5/23  Height:   Ht Readings from Last 1 Encounters:  07/15/19 5\' 5"  (1.651 m)    Weight:   Wt Readings from Last 1 Encounters:  09/20/19  55 kg    Ideal Body Weight:     BMI:  Body mass index is 20.18 kg/m.  Estimated Nutritional Needs:   Kcal:  1650-1850  Protein:  75-85g  Fluid:  1.8L/day  Clayton Bibles, MS, RD, LDN Inpatient Clinical Dietitian Contact information available via Amion

## 2019-09-22 NOTE — Progress Notes (Signed)
   09/22/19 0550  Assess: MEWS Score  BP (!) 125/51  Pulse Rate 79  ECG Heart Rate 79  Resp (!) 26  SpO2 95 %  Assess: MEWS Score  MEWS Temp 0  MEWS Systolic 0  MEWS Pulse 0  MEWS RR 2  MEWS LOC 0  MEWS Score 2  MEWS Score Color Yellow  Assess: if the MEWS score is Yellow or Red  Were vital signs taken at a resting state? Yes  Focused Assessment Documented focused assessment  Early Detection of Sepsis Score *See Row Information* Medium  MEWS guidelines implemented *See Row Information* Yes  Treat  MEWS Interventions Consulted Respiratory Therapy;Other (Comment) (notified MD, Rapid Response, and Respiratory. )  Take Vital Signs  Increase Vital Sign Frequency  Yellow: Q 2hr X 2 then Q 4hr X 2, if remains yellow, continue Q 4hrs  Escalate  MEWS: Escalate Yellow: discuss with charge nurse/RN and consider discussing with provider and RRT  Notify: Charge Nurse/RN  Name of Charge Nurse/RN Notified Rocco Serene (self) and 2nd RN Oletha Cruel notified  Date Charge Nurse/RN Notified 09/22/19  Time Charge Nurse/RN Notified 0540  Notify: Provider  Provider Name/Title Blount  Date Provider Notified 09/22/19  Time Provider Notified [phone]  Notification Type Page  Notification Reason Change in status  Response See new orders  Date of Provider Response 09/22/19  Time of Provider Response 0550  Notify: Rapid Response  Name of Rapid Response RN Notified Pamala Hurry May,RN  Date Rapid Response Notified 09/22/19  Time Rapid Response Notified [id number]  Document  Patient Outcome Other (Comment) (Stable at this time, just very lethargic.MD coming to assess)  Progress note created (see row info) Yes  Patient triggered a yellow MEWS. Rapid Response RN came to assess patient and MD notified. Respiratory came up to obtain an ABG.  On call hospitalist will round on patient shortly. Will monitor per protocol.Roderick Pee

## 2019-09-22 NOTE — Significant Event (Addendum)
Rapid Response Event Note  Overview: Time Called: 0540 Event Type: Neurologic, Other (Comment)(Increased lethergy)  Initial Focused Assessment: RRT called to bedside by primary nurse D/T decreased LOC, and increased lethargy. Upon arrival patient does not respond to verbal stimuli, appears to be in pain with sternal rub, patient makes a grimace.  but does not respond. See vital signs, VSS. HR 78, NSR, Sat's 96 RA, Resp 26, unlabored, B/P  125/51    Interventions: ABG. Pending   Recheck temp rectally 99.9    Plan of Care (if not transferred): Continue to monitor  ABG completed.  New orders for K and Fluids.    Event Summary: Name of Physician Notified: [facility] NP at [place] Kelvyn Schunk MSN, RN-BC  [place]

## 2019-09-22 NOTE — Consult Note (Addendum)
[place] for Infectious Disease       Reason for Consult: bacteremia    Referring Physician: Dr.Rai  Principal Problem:   Sepsis ([place]) Active Problems:   CAD (coronary artery disease), autologous vein bypass graft   Essential hypertension   Hyperlipidemia LDL goal <70   Memory loss   Dementia without behavioral disturbance ([name])   AKI (acute kidney injury) ([place])   Colitis   Pressure injury of skin   . atenolol  25 mg Oral Daily  . brimonidine  1 drop Left Eye BID  . clopidogrel  75 mg Oral Daily  . dorzolamide-timolol  1 drop Left Eye BID  . feeding supplement  1 Container Oral TID BM  . heparin  5,000 Units Subcutaneous Q8H  . latanoprost  1 drop Both Eyes QHS    Recommendations: Continue vancomycin Will stop cefepime and flagyl  Repeat blood cultures  Assessment: She came in dehydrated secondary to n/v and blood cultures with Staph hominis in one bottle each of both sets.  She has a history of a loop recorder in place but no other prosthetic or cardiac material noted.  Continue vancomycin and can use doxycycline at discharge for 10 days total antibiotics unless repeat blood cultures again positive.  She also came in with a report of diarrhea and vomiting and CT with some inflammatory changes c/w infectious or inflammatory colitis.  No stool output since admission but nurse reports mucous discharge and complaint of rectal discomfort.  No indication for further antibiotics for this.   Antibiotics: Vancomycin, cefepime, flagyl x 4 days  HPI: [name] is a 86 y.o. female with dementia who lives with her granddaughter who came in with v/d.  Unable to take adequate po intake.  Some rectal 'soreness'.  Normally is ambulatory at baseline.  No known sick contacts or travel.  No history otherwise obtainable from the patient who is presently non-verbal.     Review of Systems:  Unable to be assessed due to mental status  Past Medical History:  Diagnosis Date   . Actinic keratosis   . Allergic rhinitis   . Aneurysm ([place])   . Arthritis    osteoarthritis, back, hands, wrists  . Basal cell carcinoma   . Bursitis    hips bilat   . CAD (coronary artery disease), autologous vein bypass graft March 2008   Follow-up cath: December 2015 Occluded SVG-D1 and occluded SVG-RI.  [name] CAD in native artery 2005, 03/2014   a.  Severe disease noted in LAD & RI --> referred for CABG x3; b.  Follow-up cath for abnormal Myoview: Occluded SVG-RI and SVG-D1 with patent LIMA-LAD and competitive flow.  60-70% LAD and RI lesions.  Otherwise minimal disease.  EF 70%.  . Cancer ([place])    + basal cell- on leg  . Cataract   . Dementia ([place])   . Dyslipidemia    Statin intolerant  . Glaucoma   . History of kidney stones   . Hypertension   . Non-STEMI (non-ST elevated myocardial infarction) [facility]) October 2005   EF by 35-40%, echo 40-50%. Angiography: 99% mid LAD involving D1 followed by 70% mid LAD; 80% RI. --> CABG  . Osteoarthritis   . PONV (postoperative nausea and vomiting)   . S/P CABG x 01 February 2004   LIMA-LAD, SVG-D1, SVG-RI.  [name] Statin intolerance   . Stroke [facility])     Social History   Tobacco Use  . Smoking status: Never Smoker  .  Smokeless tobacco: Never Used  Substance Use Topics  . Alcohol use: Not Currently    Alcohol/week: 1.0 standard drinks    Types: 1 Standard drinks or equivalent per week    Comment: 1 glass of liquor weekly   . Drug use: No    Family History  Problem Relation Age of Onset  . Alzheimer's disease Mother   . Alzheimer's disease Sister   . Hyperlipidemia Sister   . Hypertension Sister   . Diabetes Sister   . Hyperlipidemia Sister   . Hypertension Sister   . Pancreatitis Child     Allergies  Allergen Reactions  . Nsaids Other (See Comments)    URINARY RETENTION  . Statins Other (See Comments)    MYALGIAS HURTS ALL OVER  . Tricor [Fenofibrate] Other (See Comments)    MYALGIAS HURTS ALL OVER  . Zetia [Ezetimibe]  Other (See Comments)    MYALGIAS HURTS ALL OVER  . Lescol [Fluvastatin]     Muscle pain  . Livalo [Pitavastatin]     Muscle pain  . Niaspan [Niacin]     Severe flushing   . Relafen [Nabumetone] Rash   History per record, unobtainable from the patient  Physical Exam: Constitutional: in no apparent distress  Vitals:   09/22/19 0929 09/22/19 1333  BP: (!) 139/56 (!) 165/79  Pulse: 76 80  Resp: 20 17  Temp: 98.6 F (37 C) 99.2 F (37.3 C)  SpO2: 96% 98%   EYES: anicteric Cardiovascular: Cor RRR Respiratory: clear; GI: Bowel sounds are normal, liver is not enlarged, spleen is not enlarged Musculoskeletal: no pedal edema noted Skin: negatives: no rash Neuro: non-focal  Lab Results  Component Value Date   WBC 12.2 (H) 09/22/2019   HGB 11.0 (L) 09/22/2019   HCT 33.5 (L) 09/22/2019   MCV 94.6 09/22/2019   PLT 323 09/22/2019    Lab Results  Component Value Date   CREATININE 0.94 09/22/2019   BUN 24 (H) 09/22/2019   NA 139 09/22/2019   K 3.2 (L) 09/22/2019   CL 111 09/22/2019   CO2 20 (L) 09/22/2019    Lab Results  Component Value Date   ALT 20 09/22/2019   AST 36 09/22/2019   ALKPHOS 96 09/22/2019     Microbiology: Recent Results (from the past 240 hour(s))  Urine culture     Status: None   Collection Time: 09/19/19 10:23 PM   Specimen: Urine, Clean Catch  Result Value Ref Range Status   Specimen Description   Final    URINE, CLEAN CATCH Performed at [facility], [place] [address], [place], [place] [address]    Special Requests   Final    NONE Performed at [facility], [place] [address], [place], [place] [address]    Culture   Final    NO GROWTH Performed at [facility], [place] [address] [place], [place] [address]    Report Status 09/21/2019 FINAL  Final  Blood Culture (routine x 2)     Status: Abnormal   Collection Time: 09/19/19 11:18 PM   Specimen: BLOOD  Result Value Ref Range Status   Specimen  Description   Final    BLOOD Performed at [facility] [address], [place], [place] [address]    Special Requests   Final    NONE Performed at [facility], [place] [address] [place], [place] [address]    Culture  Setup Time   Final  GRAM POSITIVE COCCI IN CLUSTERS AEROBIC BOTTLE ONLY CRITICAL RESULT CALLED TO, READ BACK BY AND VERIFIED WITH: Audrea Muscat 0113 09/21/2019 Mena Goes Performed at [facility], [place] [address], [place], [place] [address]    Culture STAPHYLOCOCCUS HOMINIS (A)  Final   Report Status 09/22/2019 FINAL  Final  Blood Culture (routine x 2)     Status: Abnormal   Collection Time: 09/19/19 11:19 PM   Specimen: BLOOD  Result Value Ref Range Status   Specimen Description   Final    BLOOD Performed at [place] [address], [place], [place] [address]    Special Requests   Final    NONE Performed at [facility], [place] [address], [place], [place] [address]    Culture  Setup Time   Final    GRAM POSITIVE COCCI IN CLUSTERS AEROBIC BOTTLE ONLY CRITICAL RESULT CALLED TO, READ BACK BY AND VERIFIED WITH: Audrea Muscat [id number] 09/21/2019 Mena Goes Performed at [facility], [place] [address] [place], [place] [address]    Culture STAPHYLOCOCCUS HOMINIS (A)  Final   Report Status 09/22/2019 FINAL  Final   Organism ID, Bacteria STAPHYLOCOCCUS HOMINIS  Final      Susceptibility   Staphylococcus hominis - MIC*    CIPROFLOXACIN <=0.5 SENSITIVE Sensitive     ERYTHROMYCIN >=8 RESISTANT Resistant     GENTAMICIN <=0.5 SENSITIVE Sensitive     OXACILLIN RESISTANT Resistant     TETRACYCLINE <=1 SENSITIVE Sensitive     VANCOMYCIN <=0.5 SENSITIVE Sensitive     TRIMETH/SULFA <=10 SENSITIVE Sensitive     CLINDAMYCIN <=0.25 SENSITIVE Sensitive     RIFAMPIN <=0.5 SENSITIVE Sensitive     Inducible Clindamycin NEGATIVE Sensitive     * STAPHYLOCOCCUS HOMINIS  SARS Coronavirus 2  by RT PCR (hospital order, performed in [facility] hospital lab) Nasopharyngeal Nasopharyngeal Swab     Status: None   Collection Time: 09/19/19 11:19 PM   Specimen: Nasopharyngeal Swab  Result Value Ref Range Status   SARS Coronavirus 2 NEGATIVE NEGATIVE Final    Comment: Performed at [facility], [place] [address] [place], [place] [address]  Blood Culture ID Panel (Reflexed)     Status: Abnormal   Collection Time: 09/19/19 11:19 PM  Result Value Ref Range Status   Enterococcus species NOT DETECTED NOT DETECTED Final   Listeria monocytogenes NOT DETECTED NOT DETECTED Final   Staphylococcus species DETECTED (A) NOT DETECTED Final    Comment: Methicillin (oxacillin) susceptible coagulase negative staphylococcus. Possible blood culture contaminant (unless isolated from more than one blood culture draw or clinical case suggests pathogenicity). No antibiotic treatment is indicated for blood  culture contaminants. CRITICAL RESULT CALLED TO, READ BACK BY AND VERIFIED WITH: J. GRIMSLEY,PHARMD 0113 09/21/2019 T. TYSOR    Staphylococcus aureus (BCID) NOT DETECTED NOT DETECTED Final   Methicillin resistance NOT DETECTED NOT DETECTED Final   Streptococcus species NOT DETECTED NOT DETECTED Final   Streptococcus agalactiae NOT DETECTED NOT DETECTED Final   Streptococcus pneumoniae NOT DETECTED NOT DETECTED Final   Streptococcus pyogenes NOT DETECTED NOT DETECTED Final   Acinetobacter baumannii NOT DETECTED NOT DETECTED Final   Enterobacteriaceae species NOT DETECTED NOT DETECTED Final   Enterobacter cloacae complex NOT DETECTED NOT DETECTED Final   Escherichia coli NOT DETECTED NOT DETECTED Final   Klebsiella oxytoca NOT DETECTED NOT DETECTED Final   Klebsiella pneumoniae NOT DETECTED NOT DETECTED Final   Proteus species NOT DETECTED NOT DETECTED Final  Serratia marcescens NOT DETECTED NOT DETECTED Final   Haemophilus influenzae NOT DETECTED NOT DETECTED Final   Neisseria  meningitidis NOT DETECTED NOT DETECTED Final   Pseudomonas aeruginosa NOT DETECTED NOT DETECTED Final   Candida albicans NOT DETECTED NOT DETECTED Final   Candida glabrata NOT DETECTED NOT DETECTED Final   Candida krusei NOT DETECTED NOT DETECTED Final   Candida parapsilosis NOT DETECTED NOT DETECTED Final   Candida tropicalis NOT DETECTED NOT DETECTED Final    Comment: Performed at [facility], [place] [address], [place], [place] [address]    Roena Sassaman W [place], [place] for Infectious Disease [facility] [email or web address] 09/22/2019, 2:44 PM

## 2019-09-22 NOTE — Progress Notes (Signed)
Patient is alert to self at this time.  I have been attempting to push PO fluids and patient will not open her mouth. She is unable to follow commands. She continues to remain very lethargic. MD was notified of this earlier this am. Will continue to monitor patient and try to push fluids/food. Roderick Pee

## 2019-09-22 NOTE — Progress Notes (Signed)
Patient extremely difficult to arouse this am.  Patient barely opens her eyes even with sternal rub.  Vitals and CBG WNL. Rapid Response RN and MD notified.  Getting an ABG on patient at this time.Roderick Pee

## 2019-09-22 NOTE — Progress Notes (Signed)
Pharmacy Antibiotic Note  [name] is a 86 y.o. female admitted on 09/19/2019 with diarrhea, emesis.  Pharmacy has been consulted for vancomycin and cefepime dosing.  Pt presented to ED with CC of diarrhea, emesis, currently on cefepime + metronidazole for colitis. BCID now + 2/2 staphylococcus hominis. Per discussion with MD, will initiate vancomycin and consulted infectious diseases.  Today, 09/22/19 -WBC 12.2 -SCr 0.94, CrCl ~37 mL/min. Improved -Afebrile  Plan:  Increase cefepime to 2 g IV q12h  Vancomycin 1000 mg IV LD followed by 750 mg IV q24h  Metronidazole 500 mg IV q8h per MD  Follow renal function, culture data. Check vancomycin levels as needed: goal VT 15-20 mcg/mL  Weight: 55 kg (121 lb 4.1 oz)(last noted )  Temp (24hrs), Avg:98.7 F (37.1 C), Min:98.2 F (36.8 C), Max:99.2 F (37.3 C)  Recent Labs  Lab 09/19/19 2044 09/19/19 2224 09/20/19 0111 09/20/19 0537 09/21/19 0517 09/22/19 0457  WBC 23.8*  --   --  19.2* 12.6* 12.2*  CREATININE 2.35*  --   --  2.42* 1.47* 0.94  LATICACIDVEN  --  2.1* 1.6  --   --   --     Estimated Creatinine Clearance: 37.3 mL/min (by C-G formula based on SCr of 0.94 mg/dL).    Allergies  Allergen Reactions  . Nsaids Other (See Comments)    URINARY RETENTION  . Statins Other (See Comments)    MYALGIAS HURTS ALL OVER  . Tricor [Fenofibrate] Other (See Comments)    MYALGIAS HURTS ALL OVER  . Zetia [Ezetimibe] Other (See Comments)    MYALGIAS HURTS ALL OVER  . Lescol [Fluvastatin]     Muscle pain  . Livalo [Pitavastatin]     Muscle pain  . Niaspan [Niacin]     Severe flushing   . Relafen [Nabumetone] Rash    Antimicrobials this admission: cefepime 5/21 >>  metronidazole 5/22 >>  Vancomycin x1 on 5/22 then resumed 5/24 >>  Dose adjustments this admission: 5/24 Cefepime 2 g q24h --> 2 g IV q12h  Microbiology results: 5/21 BCx: 2/2 staphylococcus hominis (R erythromycin, oxacillin) 5/21 UCx: ngF    Thank you for allowing pharmacy to be a part of this patient's care.  Lenis Noon, PharmD 09/22/2019 11:06 AM

## 2019-09-23 DIAGNOSIS — Z7189 Other specified counseling: Secondary | ICD-10-CM

## 2019-09-23 DIAGNOSIS — A419 Sepsis, unspecified organism: Secondary | ICD-10-CM

## 2019-09-23 DIAGNOSIS — Z515 Encounter for palliative care: Secondary | ICD-10-CM

## 2019-09-23 DIAGNOSIS — F0150 Vascular dementia without behavioral disturbance: Secondary | ICD-10-CM

## 2019-09-23 DIAGNOSIS — G9340 Encephalopathy, unspecified: Secondary | ICD-10-CM

## 2019-09-23 DIAGNOSIS — B957 Other staphylococcus as the cause of diseases classified elsewhere: Secondary | ICD-10-CM

## 2019-09-23 LAB — COMPREHENSIVE METABOLIC PANEL
ALT: 17 U/L (ref 0–44)
AST: 29 U/L (ref 15–41)
Albumin: 2.3 g/dL — ABNORMAL LOW (ref 3.5–5.0)
Alkaline Phosphatase: 93 U/L (ref 38–126)
Anion gap: 8 (ref 5–15)
BUN: 16 mg/dL (ref 8–23)
CO2: 22 mmol/L (ref 22–32)
Calcium: 7.9 mg/dL — ABNORMAL LOW (ref 8.9–10.3)
Chloride: 108 mmol/L (ref 98–111)
Creatinine, Ser: 0.85 mg/dL (ref 0.44–1.00)
GFR calc Af Amer: >60 mL/min (ref >60)
GFR calc non Af Amer: >60 mL/min (ref >60)
Glucose, Bld: 116 mg/dL — ABNORMAL HIGH (ref 70–99)
Potassium: 3.3 mmol/L — ABNORMAL LOW (ref 3.5–5.1)
Sodium: 138 mmol/L (ref 135–145)
Total Bilirubin: 0.7 mg/dL (ref 0.3–1.2)
Total Protein: 4.9 g/dL — ABNORMAL LOW (ref 6.5–8.1)

## 2019-09-23 LAB — CBC
HCT: 32.6 % — ABNORMAL LOW (ref 36.0–46.0)
Hemoglobin: 10.5 g/dL — ABNORMAL LOW (ref 12.0–15.0)
MCH: 30.4 pg (ref 26.0–34.0)
MCHC: 32.2 g/dL (ref 30.0–36.0)
MCV: 94.5 fL (ref 80.0–100.0)
Platelets: 290 10*3/uL (ref 150–400)
RBC: 3.45 MIL/uL — ABNORMAL LOW (ref 3.87–5.11)
RDW: 12.7 % (ref 11.5–15.5)
WBC: 9.3 10*3/uL (ref 4.0–10.5)
nRBC: 0 % (ref 0.0–0.2)

## 2019-09-23 LAB — CULTURE, BLOOD (ROUTINE X 2)

## 2019-09-23 MED ORDER — DEXTROSE-NACL 5-0.45 % IV SOLN: INTRAVENOUS | Status: AC

## 2019-09-23 MED ORDER — HYDRALAZINE HCL 20 MG/ML IJ SOLN
10.0000 mg | Freq: Once | INTRAMUSCULAR | Status: AC
  Administered 2019-09-23: 10 mg via INTRAVENOUS
  Filled 2019-09-23: qty 1

## 2019-09-23 MED ORDER — HYDRALAZINE HCL 20 MG/ML IJ SOLN: 10.0000 mg | Freq: Four times a day (QID) | INTRAMUSCULAR | Status: DC | PRN

## 2019-09-23 NOTE — Evaluation (Signed)
Clinical/Bedside Swallow Evaluation Patient Details  Name: [name] MRN: [id number] Date of Birth: [birth year]  Today's Date: 09/23/2019 Time: SLP Start Time (ACUTE ONLY): 41 SLP Stop Time (ACUTE ONLY): 1540 SLP Time Calculation (min) (ACUTE ONLY): 30 min  Past Medical History:  Past Medical History:  Diagnosis Date  . Actinic keratosis   . Allergic rhinitis   . Aneurysm ([place])   . Arthritis    osteoarthritis, back, hands, wrists  . Basal cell carcinoma   . Bursitis    hips bilat   . CAD (coronary artery disease), autologous vein bypass graft March 2008   Follow-up cath: December 2015 Occluded SVG-D1 and occluded SVG-RI.  [name] CAD in native artery 2005, 03/2014   a.  Severe disease noted in LAD & RI --> referred for CABG x3; b.  Follow-up cath for abnormal Myoview: Occluded SVG-RI and SVG-D1 with patent LIMA-LAD and competitive flow.  60-70% LAD and RI lesions.  Otherwise minimal disease.  EF 70%.  . Cancer ([place])    + basal cell- on leg  . Cataract   . Dementia ([place])   . Dyslipidemia    Statin intolerant  . Glaucoma   . History of kidney stones   . Hypertension   . Non-STEMI (non-ST elevated myocardial infarction) [facility]) October 2005   EF by 35-40%, echo 40-50%. Angiography: 99% mid LAD involving D1 followed by 70% mid LAD; 80% RI. --> CABG  . Osteoarthritis   . PONV (postoperative nausea and vomiting)   . S/P CABG x 01 February 2004   LIMA-LAD, SVG-D1, SVG-RI.  [name] Statin intolerance   . Stroke [facility])    Past Surgical History:  Past Surgical History:  Procedure Laterality Date  . BUNIONECTOMY     2002  . CARDIOVASCULAR STRESS TEST  05/23/2006   Mild lateral/inferolateral ischemia, likely due to occluded SVGs with exhisting disease.  [name] CAROTID DOPPLER  07/15/2009   Bilat ICAs 0-49% diameter reduction. Normal patency of Bilat subclavian arteries.  . CORONARY ARTERY BYPASS GRAFT  02/22/2004   x3. LIMA to distal LAD, SVG to first diag, SVG to ramus. SVG harvest from rt  thigh.  . EP IMPLANTABLE DEVICE N/A 03/02/2016   Procedure: Loop Recorder Insertion;  Surgeon: Thompson Grayer, MD;  Location: [facility];  Service: Cardiovascular;  Laterality: N/A;  . EXCISION/RELEASE BURSA HIP Right 06/15/2015   Procedure: RIGHT HIP BURSECTOMY WITH GLUTEAL TENDON REPAIR;  Surgeon: Gaynelle Arabian, MD;  Location: [facility];  Service: Orthopedics;  Laterality: Right;  . EYE SURGERY     cataract surgery bilat   . IR ANGIO INTRA EXTRACRAN SEL COM CAROTID INNOMINATE BILAT MOD SED  08/07/2016  . IR ANGIO VERTEBRAL SEL SUBCLAVIAN INNOMINATE BILAT MOD SED  08/07/2016  . IR GENERIC HISTORICAL  06/01/2016   IR RADIOLOGIST EVAL & MGMT 06/01/2016 [facility]-INTERV RAD  . IR RADIOLOGIST EVAL & MGMT  05/07/2017  . LEFT HEART CATHETERIZATION WITH CORONARY ANGIOGRAM  02/19/2004   Significant 2 vessel CAD - LAD, D1 and RI  . LEFT HEART CATHETERIZATION WITH CORONARY/GRAFT ANGIOGRAM N/A 04/28/2014   Procedure: LEFT HEART CATHETERIZATION WITH Beatrix Fetters;  Surgeon: Sinclair Grooms, MD;  Location: [facility] CATH LAB: CTO of SVG-Diag & SVG-RI, patent LIMA-LAD. Patent native circumflex, LAD and RCA. 70% stenosis in a branch of RI. --> Does not explain "high risk perfusion study "  . LEFT HEART CATHETERIZATION WITH CORONARY/GRAFT ANGIOGRAM   07/25/2006   Totally occluded SVG to diag and ramus. Patent LIMA-LAD. Ramus  70-80% proximal stenosis and occluded vein graft.  [name] left total knee replacement      2001  . NM MYOVIEW LTD  January 2008; December 2015   a. Referred for For mild inferolateral and anterolateral/apical lateral defect with mild reversibility.;; b. Large defect in the anterior and inferior wall. Suggestive of potential infarct plus ischemia. HIGH RISK.  [name] RADIOLOGY WITH ANESTHESIA N/A 08/07/2016   Procedure: EMBOLIZATION;  Surgeon: Luanne Bras, MD;  Location: [facility];  Service: Radiology;  Laterality: N/A;  . right total knee replacement      2003  . TRANSTHORACIC ECHOCARDIOGRAM  02/19/2004;  December 2015   a. EF 45-50%, Normal LV function, moderate hypokinesis of anterior wall.;; b. EF 50-55%. No RWMA, GR 1 DD. Normal valves    HPI:  86yo female admitted 09/19/19 with diarrhea, n/v, sepsis from presumed colitis. PMH: HTN, HLD, CAD/CABG, CVA, advanced dementia, failure to thrive, poor oral intake. CXR = hazy opacity RML base - consolidation vs atelectasis   Assessment / Plan / Recommendation Clinical Impression  Pt presents with altered mental status - awake, but unable to follow commands consistently. Oral care was completed with suction - mod cues required for cooperation. Following oral care, pt was given a small ice chip. No oral manipulation noted. Right anterior leakage observed shortly after ice chip was given. No swallow reflex observed. Immediate anterior spillage of thin liquid given by spoon, with no swallow elicited. Pt did not accept trial of puree, spitting at bolus at her lips. No further po trials were given. Pt is at high risk of aspiration and inadequate nutritional intake based on presentation and altered mental status at bedside. No safe PO diet can be recommended at this time.   Recommend NPO status, including medications until pt is more participatory. Also recommend consideration of palliative care consult to facilitate establishment of appropriate goals of care for this pt. RN and MD informed.    SLP Visit Diagnosis: Dysphagia, unspecified (R13.10)    Aspiration Risk  Moderate aspiration risk;Risk for inadequate nutrition/hydration;Severe aspiration risk    Diet Recommendation NPO   Medication Administration: Via alternative means    Other  Recommendations Oral Care Recommendations: Oral care QID Other Recommendations: Have oral suction available   Follow up Recommendations 24 hour supervision/assistance;Skilled Nursing facility      Frequency and Duration min 1 x/week  1 week;2 weeks       Prognosis Prognosis for Safe Diet Advancement:  Guarded Barriers to Reach Goals: Cognitive deficits      Swallow Study   General Date of Onset: 09/19/19 HPI: 86yo female admitted 09/19/19 with diarrhea, n/v, sepsis from presumed colitis. PMH: HTN, HLD, CAD/CABG, CVA, advanced dementia, failure to thrive, poor oral intake. CXR = hazy opacity RML base - consolidation vs atelectasis Type of Study: Bedside Swallow Evaluation Previous Swallow Assessment: none Diet Prior to this Study: Dysphagia 3 (soft);Thin liquids Temperature Spikes Noted: No Respiratory Status: Room air History of Recent Intubation: No Behavior/Cognition: Alert;Confused;Uncooperative;Doesn't follow directions;Requires cueing Oral Cavity Assessment: Dry Oral Care Completed by SLP: Yes Oral Cavity - Dentition: Adequate natural dentition;Missing dentition Self-Feeding Abilities: Total assist Patient Positioning: Upright in bed Baseline Vocal Quality: Low vocal intensity Volitional Cough: Cognitively unable to elicit Volitional Swallow: Unable to elicit    Oral/Motor/Sensory Function Overall Oral Motor/Sensory Function: Generalized oral weakness   Ice Chips Ice chips: Impaired Oral Phase Impairments: Poor awareness of bolus;Reduced labial seal;Reduced lingual movement/coordination Oral Phase Functional Implications: Right anterior spillage Pharyngeal Phase Impairments: Suspected  delayed Swallow   Thin Liquid Thin Liquid: Impaired Presentation: Spoon Oral Phase Impairments: Reduced labial seal;Reduced lingual movement/coordination;Poor awareness of bolus Oral Phase Functional Implications: Right anterior spillage Pharyngeal  Phase Impairments: Suspected delayed Swallow    Nectar Thick Nectar Thick Liquid: Not tested   Honey Thick Honey Thick Liquid: Not tested   Puree Puree: Impaired Presentation: Spoon Oral Phase Impairments: Reduced lingual movement/coordination;Reduced labial seal;Poor awareness of bolus   Solid     Solid: Not tested     Anthonia Monger B. Quentin Ore,  [facility], [place] Speech Language Pathologist Office: [phone]  Shonna Chock 09/23/2019,3:48 PM

## 2019-09-23 NOTE — Consult Note (Signed)
Consultation Note Date: 09/23/2019   Patient Name: [name]  DOB: Jul 29, 1931  MRN: [id number]  Age / Sex: 86 y.o., female  PCP: Erica Ada, MD Referring Physician: Mendel Corning, MD  Reason for Consultation: Establishing goals of care  HPI/Patient Profile: 86 y.o. female  with past medical history of stroke, osteoarthritis, non-STEMI, hypertension, dementia, CAD, basal cell carcinoma, aneurysm, hyperlipidemia admitted on 09/19/2019 with sepsis secondary to colitis.   Clinical Assessment and Goals of Care: Went to visit patient at bedside - answers questions today inconsistently due to confusion. She answers yes and no to some questions and does not answer other questions - she is not able to have a conversation today.  I have reviewed medical records including EPIC notes, labs and imaging, received report from primary RN, assessed the patient and then met with granddaughter/[name] via phone to discuss diagnosis and GOC.  As far as functional and nutritional status [name] states that the patient had a fall 2 months ago, which is when she started to notice a drastic decline in functional and mental status. At baseline, the patient was able to ambulate with assistance and had a fair appetite - she had 24/7 care at home assisting with all needs and ADLs. Currently, SLP is recommending an NPO diet due to severe aspiration risk - no swallow reflex was noted during observations. [name] has noticed an increase in confusion for the patient as well since her fall and especially this admission.  Concepts specific to code status were considered and discussed. It was noted that during previous admissions in 2017 and 2018 the patient had been a DNR. Discussed if [name] would like to change code status for this admission. At this time, she wishes for [name] to remain full code and full scope care.  Discussed with patient/family the  importance of continued conversation with family and the medical providers regarding overall plan of care and treatment options, ensuring decisions are within the context of the patient's values and GOCs.    [name] is open for a more in-depth conversation tomorrow afternoon when she gets off work.   Questions and concerns were addressed. The family was encouraged to call with questions or concerns.     Primary Decision Maker HCPOA - granddaughter - [name]    SUMMARY OF RECOMMENDATIONS   -Continue current medical treatment -Continue full code -Will follow up with Erica/granddaughter tomorrow afternoon for further code status and GOC discussion  Code Status/Advance Care Planning:  Full code  Palliative Prophylaxis:   Aspiration, Delirium Protocol and Frequent Pain Assessment  Additional Recommendations (Limitations, Scope, Preferences):  Full Scope Treatment  Psycho-social/Spiritual:   Created space and opportunity for patient and family to express thoughts and feelings regarding patient's current medical situation.  Prognosis:   Unable to determine  Discharge Planning: To Be Determined      Primary Diagnoses: Present on Admission: . Sepsis ([place]) . CAD (coronary artery disease), autologous vein bypass graft . Essential hypertension . Hyperlipidemia LDL goal <70 . Memory loss . Dementia without behavioral disturbance ([place])   I have reviewed the medical record, interviewed the patient and family, and examined the patient. The following aspects are pertinent.  Past Medical History:  Diagnosis Date  . Actinic keratosis   . Allergic rhinitis   . Aneurysm ([place])   . Arthritis    osteoarthritis, back, hands, wrists  . Basal cell carcinoma   . Bursitis    hips bilat   . CAD (coronary artery  disease), autologous vein bypass graft March 2008   Follow-up cath: December 2015 Occluded SVG-D1 and occluded SVG-RI.  [name] CAD in native artery 2005, 03/2014     a.  Severe disease noted in LAD & RI --> referred for CABG x3; b.  Follow-up cath for abnormal Myoview: Occluded SVG-RI and SVG-D1 with patent LIMA-LAD and competitive flow.  60-70% LAD and RI lesions.  Otherwise minimal disease.  EF 70%.  . Cancer ([place])    + basal cell- on leg  . Cataract   . Dementia ([place])   . Dyslipidemia    Statin intolerant  . Glaucoma   . History of kidney stones   . Hypertension   . Non-STEMI (non-ST elevated myocardial infarction) [facility]) October 2005   EF by 35-40%, echo 40-50%. Angiography: 99% mid LAD involving D1 followed by 70% mid LAD; 80% RI. --> CABG  . Osteoarthritis   . PONV (postoperative nausea and vomiting)   . S/P CABG x 01 February 2004   LIMA-LAD, SVG-D1, SVG-RI.  [name] Statin intolerance   . Stroke [facility])    Social History   Socioeconomic History  . Marital status: Widowed    Spouse name: Not on file  . Number of children: 2  . Years of education: 16+  . Highest education level: Not on file  Occupational History  . Occupation: retired  Tobacco Use  . Smoking status: Never Smoker  . Smokeless tobacco: Never Used  Substance and Sexual Activity  . Alcohol use: Not Currently    Alcohol/week: 1.0 standard drinks    Types: 1 Standard drinks or equivalent per week    Comment: 1 glass of liquor weekly   . Drug use: No  . Sexual activity: Not on file  Other Topics Concern  . Not on file  Social History Narrative   She is a widowed, mother of 2 grandmother of 3.    Lives with granddaughter -[name]   Drinks coffee every morning. 1 cup.   She used to do exercising through the [employer] program as well as the [employer]. Unfortunately, this finding of the classes for seniors changed, and she was unable to make the new classes. She did not like exercising with non-seniors. Besides that she does existing disease, walks up and down the stairs, walk her dog. She is not as active as he had been before.   She never smoked, never drank alcohol.       Social Determinants of Health   Financial Resource Strain:   . Difficulty of Paying Living Expenses:   Food Insecurity:   . Worried About Charity fundraiser in the Last Year:   . Arboriculturist in the Last Year:   Transportation Needs:   . Film/video editor (Medical):   [name] Lack of Transportation (Non-Medical):   Physical Activity:   . Days of Exercise per Week:   . Minutes of Exercise per Session:   Stress:   . Feeling of Stress :   Social Connections:   . Frequency of Communication with Friends and Family:   . Frequency of Social Gatherings with Friends and Family:   . Attends Religious Services:   . Active Member of Clubs or Organizations:   . Attends Archivist Meetings:   [name] Marital Status:    Family History  Problem Relation Age of Onset  . Alzheimer's disease Mother   . Alzheimer's disease Sister   . Hyperlipidemia Sister   . Hypertension Sister   .  Diabetes Sister   . Hyperlipidemia Sister   . Hypertension Sister   . Pancreatitis Child    Scheduled Meds: . atenolol  25 mg Oral Daily  . brimonidine  1 drop Left Eye BID  . chlorhexidine  15 mL Mouth Rinse BID  . clopidogrel  75 mg Oral Daily  . dorzolamide-timolol  1 drop Left Eye BID  . feeding supplement  1 Container Oral TID BM  . heparin  5,000 Units Subcutaneous Q8H  . latanoprost  1 drop Both Eyes QHS  . mouth rinse  15 mL Mouth Rinse q12n4p   Continuous Infusions: . vancomycin 750 mg (09/23/19 1153)   PRN Meds:.hydrALAZINE Allergies  Allergen Reactions  . Nsaids Other (See Comments)    URINARY RETENTION  . Statins Other (See Comments)    MYALGIAS HURTS ALL OVER  . Tricor [Fenofibrate] Other (See Comments)    MYALGIAS HURTS ALL OVER  . Zetia [Ezetimibe] Other (See Comments)    MYALGIAS HURTS ALL OVER  . Lescol [Fluvastatin]     Muscle pain  . Livalo [Pitavastatin]     Muscle pain  . Niaspan [Niacin]     Severe flushing   . Relafen [Nabumetone] Rash   Review of  Systems  Unable to perform ROS: Dementia    Physical Exam Constitutional:      General: She is not in acute distress.    Appearance: She is not ill-appearing.  Pulmonary:     Effort: Pulmonary effort is normal. No respiratory distress.  Abdominal:     Palpations: Abdomen is soft.  Musculoskeletal:     Right lower leg: No edema.     Left lower leg: No edema.  Skin:    General: Skin is warm and dry.  Neurological:     Mental Status: She is alert. She is disoriented and confused.     Vital Signs: BP (!) 130/56 (BP Location: Left Arm)   Pulse 76   Temp 97.9 F (36.6 C) (Oral)   Resp 20   Wt 55 kg Comment: last noted   SpO2 99%   BMI 20.18 kg/m  Pain Scale: 0-10 POSS *See Group Information*: S-Acceptable,Sleep, easy to arouse Pain Score: Asleep   SpO2: SpO2: 99 % O2 Device:SpO2: 99 % O2 Flow Rate: .   IO: Intake/output summary:   Intake/Output Summary (Last 24 hours) at 09/23/2019 1618 Last data filed at 09/22/2019 2227 Gross per 24 hour  Intake 733.55 ml  Output 200 ml  Net 533.55 ml    LBM: Last BM Date: (PTA) Baseline Weight: Weight: 55 kg(last noted ) Most recent weight: Weight: 55 kg(last noted )     Palliative Assessment/Data: 10%    Time Total: 40 min  Greater than 50%  of this time was spent counseling and coordinating care related to the above assessment and plan.  [facility] FNP-BC   [place], DNP, AGNP-C Palliative Medicine Team [phone] Pager: [phone]

## 2019-09-23 NOTE — Care Management Important Message (Signed)
Important Message  Patient Details IM Letter given to Marney Doctor RN Case Manager to present to the Patient Name: [name] MRN: [id number] Date of Birth: Nov 26, 1931   Medicare Important Message Given:  Yes     Kerin Salen 09/23/2019, 10:23 AM

## 2019-09-23 NOTE — Progress Notes (Signed)
[facility]                                                                              Patient Demographics  [name], is a 86 y.o. female, DOB - 1931-09-29, CBJ:[id number]  Admit date - 09/19/2019   Admitting Physician Erica Mann, MD  Outpatient Primary MD for the patient is Erica Ada, MD  Outpatient specialists:   LOS - 3  days   Medical records reviewed and are as summarized below:    Chief Complaint  Patient presents with   Diarrhea   Emesis       Brief summary   Patient is 86 year old female with history of hypertension, hyperlipidemia, CAD, CVA, dementia presented with diarrhea, vomiting, was admitted for sepsis from presumed colitis Patient had reported vomiting and diarrhea for 2 days, unable to eat or drink anything on the day of admission, possible complaining of rectal soreness per granddaughter.  At baseline, normally able to walk with assistance, eat and usually fairly alert and awake.  Over last 24 hours prior to admission, she had been less responsive.  Per EMS, was initially hypotensive 90/50, improved with IV fluid bolus. In ED, lipase within normal limits, creatinine 2.35, alk phos 145, WBCs 23.8 lactate 2.1.  UA negative Chest x-ray hazy opacity in medial right lung base, early consolidation or atelectasis.  CT abdomen pelvis showed marked circumferential thickening and adjacent inflammation centered upon the rectum and distal sigmoid no associated pneumatosis, free air.  Differentials including infectious or inflammatory colitis.   Assessment & Plan    Principal Problem:   Sepsis ([place]), acute infectious colitis, staph hominis bacteremia - Patient met sepsis criteria at the time of admission with acute kidney injury, leukocytosis, hypotensive, lactic acidosis -Vancomycin was initiated on admission, BC ID possibly contaminant however now showing staph hominis .  Reinitiated vancomycin.   -ID following, appreciate  recommendations, continue vancomycin cefepime and Flagyl for 4 days -SLP evaluation  Acute kidney injury -Creatinine 2.35 at the time of admission, baseline 0.73 on 07/17/2019 in the setting of #1, hypovolemia, ACE inhibitor -Creatinine now improved 0.85  Hypotension  -BP elevated, continue atenolol, added hydralazine IV as needed with parameters  Coronary artery disease -Restart beta-blocker -Continue Plavix  Dementia with delirium/acute metabolic encephalopathy - At baseline, is alert, can walk with assistance - Patient has not started Aricept, Namenda, will continue to hold -Much more alert and awake today than yesterday however given advanced dementia, failure to thrive, poor oral intake, palliative medicine consulted for goals of care   Code Status: Full CODE STATUS DVT Prophylaxis:   SCD's Family Communication: Discussed all imaging results, lab results, explained to the patient's grandaughter, [name] on 5/24.    Disposition Plan:     Status is: Inpatient  Remains inpatient appropriate because:Inpatient level of care appropriate due to severity of illness, palliative medicine goals of care pending   Dispo: The patient is from: Home              Anticipated d/c is to: Home              Anticipated d/c date is: 2  days              Patient currently is not medically stable to d/c.       Time Spent in minutes 25 minutes  Procedures:  CT abdomen  Consultants:   None  Antimicrobials:   Anti-infectives (From admission, onward)   Start     Dose/Rate Route Frequency Ordered Stop   09/23/19 1200  vancomycin (VANCOREADY) IVPB 750 mg/150 mL     750 mg 150 mL/hr over 60 Minutes Intravenous Every 24 hours 09/22/19 1106     09/22/19 1130  vancomycin (VANCOCIN) IVPB 1000 mg/200 mL premix     1,000 mg 200 mL/hr over 60 Minutes Intravenous STAT 09/22/19 1105 09/22/19 1334   09/22/19 1000  ceFEPIme (MAXIPIME) 2 g in sodium chloride 0.9 % 100 mL IVPB  Status:   Discontinued     2 g 200 mL/hr over 30 Minutes Intravenous Every 12 hours 09/22/19 0838 09/22/19 1447   09/21/19 1200  vancomycin (VANCOREADY) IVPB 500 mg/100 mL  Status:  Discontinued     500 mg 100 mL/hr over 60 Minutes Intravenous Every 36 hours 09/20/19 0449 09/20/19 0941   09/20/19 2200  ceFEPIme (MAXIPIME) 2 g in sodium chloride 0.9 % 100 mL IVPB  Status:  Discontinued     2 g 200 mL/hr over 30 Minutes Intravenous Every 24 hours 09/20/19 0449 09/22/19 0838   09/20/19 0800  metroNIDAZOLE (FLAGYL) IVPB 500 mg  Status:  Discontinued     500 mg 100 mL/hr over 60 Minutes Intravenous Every 8 hours 09/20/19 0202 09/22/19 1447   09/19/19 2245  ceFEPIme (MAXIPIME) 2 g in sodium chloride 0.9 % 100 mL IVPB     2 g 200 mL/hr over 30 Minutes Intravenous  Once 09/19/19 2236 09/19/19 2357   09/19/19 2245  metroNIDAZOLE (FLAGYL) IVPB 500 mg     500 mg 100 mL/hr over 60 Minutes Intravenous  Once 09/19/19 2236 09/20/19 0102   09/19/19 2245  vancomycin (VANCOCIN) IVPB 1000 mg/200 mL premix     1,000 mg 200 mL/hr over 60 Minutes Intravenous  Once 09/19/19 2236 09/20/19 0206         Medications  Scheduled Meds:  atenolol  25 mg Oral Daily   brimonidine  1 drop Left Eye BID   chlorhexidine  15 mL Mouth Rinse BID   clopidogrel  75 mg Oral Daily   dorzolamide-timolol  1 drop Left Eye BID   feeding supplement  1 Container Oral TID BM   heparin  5,000 Units Subcutaneous Q8H   latanoprost  1 drop Both Eyes QHS   mouth rinse  15 mL Mouth Rinse q12n4p   Continuous Infusions:  vancomycin 750 mg (09/23/19 1153)   PRN Meds:.      Subjective:   [name] was seen and examined today.  Sitting up, much more alert and awake today.  However does not respond verbally to commands, smiles and tracks with eyes,  Objective:   Vitals:   09/22/19 2311 09/23/19 0612 09/23/19 1054 09/23/19 1055  BP: 137/76 (!) 148/64 (!) 164/69 (!) 164/69  Pulse: 83 80 87 77  Resp: 18 20    Temp:  98.1 F (36.7 C) 97.9 F (36.6 C)    TempSrc: Oral Oral    SpO2: 96% 97%  99%  Weight:        Intake/Output Summary (Last 24 hours) at 09/23/2019 1335 Last data filed at 09/22/2019 2227 Gross per 24 hour  Intake 733.55 ml  Output  200 ml  Net 533.55 ml     Wt Readings from Last 3 Encounters:  09/20/19 55 kg  07/15/19 55 kg  07/14/19 55 kg   Physical Exam  General: Alert and awake, mental status improving from yesterday  Cardiovascular: S1 S2 clear, RRR. No pedal edema b/l  Respiratory: CTAB, no wheezing, rales or rhonchi  Gastrointestinal: Soft, nontender, nondistended, NBS  Ext: no pedal edema bilaterally  Neuro: Does not follow commands  Musculoskeletal: No cyanosis, clubbing  Skin: No rashes  Psych: Does not follow verbal commands, still confused, dementia      Data Reviewed:  I have personally reviewed following labs and imaging studies  Micro Results Recent Results (from the past 240 hour(s))  Urine culture     Status: None   Collection Time: 09/19/19 10:23 PM   Specimen: Urine, Clean Catch  Result Value Ref Range Status   Specimen Description   Final    URINE, CLEAN CATCH Performed at [facility], [place] [address], [place], [place] [address]    Special Requests   Final    NONE Performed at [facility], [place] [address], [place], [place] [address]    Culture   Final    NO GROWTH Performed at [facility], [place] [address] [place], [place] [address]    Report Status 09/21/2019 FINAL  Final  Blood Culture (routine x 2)     Status: Abnormal   Collection Time: 09/19/19 11:18 PM   Specimen: BLOOD  Result Value Ref Range Status   Specimen Description   Final    BLOOD Performed at [place] [address] [place], [place] [address]    Special Requests   Final    NONE Performed at [facility], [place] [address], [place], [place] [address]    Culture  Setup Time    Final    GRAM POSITIVE COCCI IN CLUSTERS AEROBIC BOTTLE ONLY CRITICAL RESULT CALLED TO, READ BACK BY AND VERIFIED WITH: Audrea Muscat [id number] 09/21/2019 Mena Goes Performed at [facility], [place] [address] [place], [place] [address]    Culture STAPHYLOCOCCUS HOMINIS (A)  Final   Report Status 09/22/2019 FINAL  Final  Blood Culture (routine x 2)     Status: Abnormal   Collection Time: 09/19/19 11:19 PM   Specimen: BLOOD  Result Value Ref Range Status   Specimen Description   Final    BLOOD Performed at [place] [address] [place], [place] [address]    Special Requests   Final    NONE Performed at [facility], [place], [place] [address] [place], [place] [address]    Culture  Setup Time   Final    GRAM POSITIVE COCCI IN CLUSTERS IN BOTH AEROBIC AND ANAEROBIC BOTTLES CRITICAL RESULT CALLED TO, READ BACK BY AND VERIFIED WITH: Audrea Muscat 0113 09/21/2019 Mena Goes Performed at [facility], [place] [address] [place], [place] [address]    Culture STAPHYLOCOCCUS HOMINIS (A)  Final   Report Status 09/22/2019 FINAL  Final   Organism ID, Bacteria STAPHYLOCOCCUS HOMINIS  Final      Susceptibility   Staphylococcus hominis - MIC*    CIPROFLOXACIN <=0.5 SENSITIVE Sensitive     ERYTHROMYCIN >=8 RESISTANT Resistant     GENTAMICIN <=0.5 SENSITIVE Sensitive     OXACILLIN RESISTANT Resistant     TETRACYCLINE <=1 SENSITIVE Sensitive     VANCOMYCIN <=0.5 SENSITIVE Sensitive  TRIMETH/SULFA <=10 SENSITIVE Sensitive     CLINDAMYCIN <=0.25 SENSITIVE Sensitive     RIFAMPIN <=0.5 SENSITIVE Sensitive     Inducible Clindamycin NEGATIVE Sensitive     * STAPHYLOCOCCUS HOMINIS  SARS Coronavirus 2 by RT PCR (hospital order, performed in [facility] hospital lab) Nasopharyngeal Nasopharyngeal Swab     Status: None   Collection Time: 09/19/19 11:19 PM   Specimen: Nasopharyngeal Swab  Result Value Ref Range Status   SARS Coronavirus 2 NEGATIVE  NEGATIVE Final    Comment: Performed at [facility], [place] [address], [place], [place] [address]  Blood Culture ID Panel (Reflexed)     Status: Abnormal   Collection Time: 09/19/19 11:19 PM  Result Value Ref Range Status   Enterococcus species NOT DETECTED NOT DETECTED Final   Listeria monocytogenes NOT DETECTED NOT DETECTED Final   Staphylococcus species DETECTED (A) NOT DETECTED Final    Comment: Methicillin (oxacillin) susceptible coagulase negative staphylococcus. Possible blood culture contaminant (unless isolated from more than one blood culture draw or clinical case suggests pathogenicity). No antibiotic treatment is indicated for blood  culture contaminants. CRITICAL RESULT CALLED TO, READ BACK BY AND VERIFIED WITH: J. GRIMSLEY,PHARMD 0113 09/21/2019 T. TYSOR    Staphylococcus aureus (BCID) NOT DETECTED NOT DETECTED Final   Methicillin resistance NOT DETECTED NOT DETECTED Final   Streptococcus species NOT DETECTED NOT DETECTED Final   Streptococcus agalactiae NOT DETECTED NOT DETECTED Final   Streptococcus pneumoniae NOT DETECTED NOT DETECTED Final   Streptococcus pyogenes NOT DETECTED NOT DETECTED Final   Acinetobacter baumannii NOT DETECTED NOT DETECTED Final   Enterobacteriaceae species NOT DETECTED NOT DETECTED Final   Enterobacter cloacae complex NOT DETECTED NOT DETECTED Final   Escherichia coli NOT DETECTED NOT DETECTED Final   Klebsiella oxytoca NOT DETECTED NOT DETECTED Final   Klebsiella pneumoniae NOT DETECTED NOT DETECTED Final   Proteus species NOT DETECTED NOT DETECTED Final   Serratia marcescens NOT DETECTED NOT DETECTED Final   Haemophilus influenzae NOT DETECTED NOT DETECTED Final   Neisseria meningitidis NOT DETECTED NOT DETECTED Final   Pseudomonas aeruginosa NOT DETECTED NOT DETECTED Final   Candida albicans NOT DETECTED NOT DETECTED Final   Candida glabrata NOT DETECTED NOT DETECTED Final   Candida krusei NOT DETECTED NOT DETECTED Final     Candida parapsilosis NOT DETECTED NOT DETECTED Final   Candida tropicalis NOT DETECTED NOT DETECTED Final    Comment: Performed at [facility], [address] [place], [place] [address]    Radiology Reports CT ABDOMEN PELVIS WO CONTRAST  Result Date: 09/20/2019 CLINICAL DATA:  Nausea and vomiting EXAM: CT ABDOMEN AND PELVIS WITHOUT CONTRAST TECHNIQUE: Multidetector CT imaging of the abdomen and pelvis was performed following the standard protocol without IV contrast. COMPARISON:  CT 07/15/2019 FINDINGS: Lower chest: Small to moderate right pleural effusion with some adjacent passive atelectasis. Atelectatic changes present the left lung base as well. Normal heart size. No pericardial effusion. Atherosclerotic calcifications noted upon the coronary arteries as well as the included portions of the thoracic aorta. No pericardial effusion. Hepatobiliary: No visible liver lesions. Smooth liver surface contour. Normal hepatic attenuation. Mild distension of the gallbladder with dependently layering calcified gallstones towards the gallbladder neck. No visible intraductal gallstones or biliary ductal dilatation is seen. Pancreas: Diffuse pancreatic atrophy. No concerning pancreatic lesions, a inflammation, or ductal dilatation. Spleen: Normal spleen. Small accessory splenule towards the hilum. Adrenals/Urinary Tract: Normal adrenal glands. Stable appearance of a fluid attenuation 3 cm cyst in  the posterior right interpolar kidney no concerning renal mass. No urolithiasis or hydronephrosis. Urinary bladder is largely decompressed at the time of exam and therefore poorly evaluated by CT imaging. No gross bladder abnormality. Stomach/Bowel: Distal esophagus, stomach and duodenal sweep are unremarkable. No small bowel wall thickening or dilatation. No evidence of obstruction. Few sparse small bowel diverticula are noted no visible small bowel diverticular inflammation is seen however no proximal colonic wall  thickening or dilatation. There is marked distal colonic inflammatory change extending in contiguous fashion from the level of the rectum through the distal sigmoid. Extensive stranding and inflammatory change noted in the perirectal and pericolonic fat. No free fluid or free air. No associated pneumatosis. Vascular/Lymphatic: Atherosclerotic calcifications throughout the abdominal aorta and branch vessels. No aneurysm or ectasia. No enlarged abdominopelvic lymph nodes. Reproductive: Normal appearance of the uterus and adnexal structures. Other: Inflammatory changes centered upon the rectosigmoid as above. Postsurgical changes noted in lateral left hip soft tissues. No abdominopelvic free air or fluid. No bowel containing hernia. Musculoskeletal: Multilevel degenerative changes are present in the imaged portions of the spine. Retrolisthesis of L2 on L3 and grade 1 anterolisthesis L4 on L5 is similar to prior. Surgical anchors noted in the right greater trochanter. Additional degenerative changes noted in both hips. Sclerotic changes thickening at the symphysis pubis are chronic and may reflect sequela of prior osteitis pubis. IMPRESSION: 1. Marked circumferential thickening and adjacent inflammation centered upon the rectum and distal sigmoid. No free fluid or free air. No associated pneumatosis. Differential considerations include infectious or inflammatory colitis versus less likely ischemic colitis. 2. Small to moderate right pleural effusion with some adjacent passive atelectasis. 3. Cholelithiasis without CT evidence of acute cholecystitis. 4. Aortic Atherosclerosis (ICD10-I70.0). Electronically Signed   By: Lovena Le M.D.   On: 09/20/2019 00:09   DG Lumbar Spine Complete  Result Date: 09/01/2019 CLINICAL DATA:  Hip pain after fall. EXAM: LUMBAR SPINE - COMPLETE 4+ VIEW COMPARISON:  July 14, 2019. FINDINGS: Mild grade 1 retrolisthesis of L1-2 and L2-3 is noted secondary to moderate degenerative disc  disease at these levels. Mild grade 1 anterolisthesis of L4-5 is noted secondary to posterior facet joint hypertrophy. Severe degenerative disc disease is noted at L5-S1. No fracture is noted. Atherosclerosis of abdominal aorta is noted. IMPRESSION: Multilevel degenerative disc disease. No acute abnormality seen in the lumbar spine. Aortic Atherosclerosis (ICD10-I70.0). Electronically Signed   By: Marijo Conception M.D.   On: 09/01/2019 13:34   CT Head Wo Contrast  Result Date: 09/01/2019 CLINICAL DATA:  Unwitnessed fall EXAM: CT HEAD WITHOUT CONTRAST TECHNIQUE: Contiguous axial images were obtained from the base of the skull through the vertex without intravenous contrast. COMPARISON:  07/14/2019 FINDINGS: Brain: No evidence of acute infarction, hemorrhage, hydrocephalus, extra-axial collection or mass lesion/mass effect. Extensive low-density changes within the periventricular and subcortical white matter compatible with chronic microvascular ischemic change. Mild-moderate diffuse cerebral volume loss. Vascular: Atherosclerotic calcifications involving the large vessels of the skull base. No unexpected hyperdense vessel. Skull: Normal. Negative for fracture or focal lesion. Sinuses/Orbits: No acute finding. Other: None. IMPRESSION: 1.  No acute intracranial findings. 2. Chronic microvascular ischemic change and cerebral volume loss, stable from prior. Electronically Signed   By: Davina Poke D.O.   On: 09/01/2019 14:15   DG Chest Port 1 View  Result Date: 09/19/2019 CLINICAL DATA:  Sepsis, nausea vomiting and diarrhea with decreased appetite since Thursday. EXAM: PORTABLE CHEST 1 VIEW COMPARISON:  Radiograph 07/14/2019 FINDINGS: There is some  hazy opacity in the medial right lung base, increased from prior. Remaining portions the lungs are predominantly clear. No pneumothorax or effusion. No convincing features of edema. Postsurgical changes related to prior CABG including aligned sternotomy wires and  multiple surgical clips projecting over the mediastinum. Inferior most sternal suture is fractured, similar to comparison. The aorta is calcified. The remaining cardiomediastinal contours are unremarkable. Implantable loop recorder is noted. No acute osseous or soft tissue abnormality. Degenerative changes are present in the imaged spine and shoulders. IMPRESSION: 1. Hazy opacity in the medial right lung base, increased from prior. Findings could reflect early consolidation or atelectasis. 2. Stable postoperative changes related to prior CABG. Inferior most sternal suture is fractured, similar to comparison. 3.  Aortic Atherosclerosis (ICD10-I70.0). Electronically Signed   By: Lovena Le M.D.   On: 09/19/2019 23:12   DG Hips Bilat W or Wo Pelvis 3-4 Views  Result Date: 09/01/2019 CLINICAL DATA:  Status post fall, left hip pain EXAM: DG HIP (WITH OR WITHOUT PELVIS) 3-4V BILAT COMPARISON:  None. FINDINGS: Generalized osteopenia. No acute fracture or dislocation. 2 bone anchors are present in the right greater trochanter likely from gluteus tendon repair. Mild joint space narrowing of bilateral hips consistent with mild osteoarthritis. No aggressive osseous lesion. Lower lumbar spine spondylosis. Mild osteoarthritis of bilateral SI joints. Degenerative changes of the pubic symphysis. IMPRESSION: No acute osseous injury of bilateral hips. Given the patient's age and osteopenia, if there is persistent clinical concern for an occult hip fracture, a MRI of the hip is recommended for increased sensitivity. Electronically Signed   By: Kathreen Devoid   On: 09/01/2019 13:33    Lab Data:  CBC: Recent Labs  Lab 09/19/19 2044 09/20/19 6720 09/21/19 0517 09/22/19 0457 09/23/19 0555  WBC 23.8* 19.2* 12.6* 12.2* 9.3  HGB 13.1 11.9* 10.7* 11.0* 10.5*  HCT 40.1 37.1 33.5* 33.5* 32.6*  MCV 95.5 97.1 96.3 94.6 94.5  PLT 512* 423* 361 323 947   Basic Metabolic Panel: Recent Labs  Lab 09/19/19 2044 09/20/19 0537  09/21/19 0517 09/22/19 0457 09/23/19 0555  NA 142 141 139 139 138  K 4.4 4.3 3.6 3.2* 3.3*  CL 105 108 108 111 108  CO2 '22 22 23 ' 20* 22  GLUCOSE 158* 136* 120* 141* 116*  BUN 38* 44* 40* 24* 16  CREATININE 2.35* 2.42* 1.47* 0.94 0.85  CALCIUM 9.0 8.5* 8.2* 8.0* 7.9*  MG 2.2  --   --   --   --   PHOS 5.9*  --   --   --   --    GFR: Estimated Creatinine Clearance: 41.3 mL/min (by C-G formula based on SCr of 0.85 mg/dL). Liver Function Tests: Recent Labs  Lab 09/19/19 2044 09/20/19 0537 09/21/19 0517 09/22/19 0457 09/23/19 0555  AST 44* 40 46* 36 29  ALT '26 24 22 20 17  ' ALKPHOS 145* 126 110 96 93  BILITOT 1.1 0.9 0.6 0.6 0.7  PROT 6.4* 5.5* 5.3* 5.0* 4.9*  ALBUMIN 3.1* 2.8* 2.5* 2.4* 2.3*   Recent Labs  Lab 09/19/19 2044  LIPASE 16   No results for input(s): AMMONIA in the last 168 hours. Coagulation Profile: Recent Labs  Lab 09/19/19 2318  INR 1.3*   Cardiac Enzymes: No results for input(s): CKTOTAL, CKMB, CKMBINDEX, TROPONINI in the last 168 hours. BNP (last 3 results) No results for input(s): PROBNP in the last 8760 hours. HbA1C: No results for input(s): HGBA1C in the last 72 hours. CBG: Recent Labs  Lab  09/22/19 0504  GLUCAP 117*   Lipid Profile: No results for input(s): CHOL, HDL, LDLCALC, TRIG, CHOLHDL, LDLDIRECT in the last 72 hours. Thyroid Function Tests: No results for input(s): TSH, T4TOTAL, FREET4, T3FREE, THYROIDAB in the last 72 hours. Anemia Panel: No results for input(s): VITAMINB12, FOLATE, FERRITIN, TIBC, IRON, RETICCTPCT in the last 72 hours. Urine analysis:    Component Value Date/Time   COLORURINE YELLOW 09/19/2019 2044   APPEARANCEUR CLEAR 09/19/2019 2044   APPEARANCEUR Clear 01/27/2016 1115   LABSPEC 1.016 09/19/2019 2044   PHURINE 5.0 09/19/2019 2044   GLUCOSEU NEGATIVE 09/19/2019 2044   HGBUR NEGATIVE 09/19/2019 2044   BILIRUBINUR NEGATIVE 09/19/2019 2044   BILIRUBINUR Negative 01/27/2016 [place]  09/19/2019 2044   PROTEINUR NEGATIVE 09/19/2019 2044   NITRITE NEGATIVE 09/19/2019 2044   LEUKOCYTESUR NEGATIVE 09/19/2019 2044     Keiley Levey M.D. [facility] 09/23/2019, 1:35 PM   Call night coverage person covering after 7pm

## 2019-09-23 NOTE — Progress Notes (Signed)
[facility] for Infectious Disease   Reason for visit: Follow up on bacteremia  Interval History: WBC wnl, remains afebrile.    Physical Exam: Constitutional:  Vitals:   09/23/19 1055 09/23/19 1343  BP: (!) 164/69 (!) 130/56  Pulse: 77 76  Resp:    Temp:    SpO2: 99%    patient appears in NAD, more alert Respiratory: Normal respiratory effort; CTA B Cardiovascular: RRR GI: soft, nt, nd  Review of Systems: Unable to be assessed due to mental status  Lab Results  Component Value Date   WBC 9.3 09/23/2019   HGB 10.5 (L) 09/23/2019   HCT 32.6 (L) 09/23/2019   MCV 94.5 09/23/2019   PLT 290 09/23/2019    Lab Results  Component Value Date   CREATININE 0.85 09/23/2019   BUN 16 09/23/2019   NA 138 09/23/2019   K 3.3 (L) 09/23/2019   CL 108 09/23/2019   CO2 22 09/23/2019    Lab Results  Component Value Date   ALT 17 09/23/2019   AST 29 09/23/2019   ALKPHOS 93 09/23/2019     Microbiology: Recent Results (from the past 240 hour(s))  Urine culture     Status: None   Collection Time: 09/19/19 10:23 PM   Specimen: Urine, Clean Catch  Result Value Ref Range Status   Specimen Description   Final    URINE, CLEAN CATCH Performed at [facility], [place] [address] [place], [place] [address]    Special Requests   Final    NONE Performed at [facility], [place] [address], [place], [place] [address]    Culture   Final    NO GROWTH Performed at [facility], [place] [address] [place], [place] [address]    Report Status 09/21/2019 FINAL  Final  Blood Culture (routine x 2)     Status: Abnormal   Collection Time: 09/19/19 11:18 PM   Specimen: BLOOD  Result Value Ref Range Status   Specimen Description   Final    BLOOD Performed at [facility] [address], [place], [place] [address]    Special Requests   Final    NONE Performed at [facility], [place] [address], [place], [place]  [address]    Culture  Setup Time   Final    GRAM POSITIVE COCCI IN CLUSTERS AEROBIC BOTTLE ONLY CRITICAL RESULT CALLED TO, READ BACK BY AND VERIFIED WITH: Audrea Muscat [id number] 09/21/2019 Mena Goes Performed at [facility], [place] [address], [place], [place] [address]    Culture STAPHYLOCOCCUS HOMINIS (A)  Final   Report Status 09/22/2019 FINAL  Final  Blood Culture (routine x 2)     Status: Abnormal   Collection Time: 09/19/19 11:19 PM   Specimen: BLOOD  Result Value Ref Range Status   Specimen Description   Final    BLOOD Performed at [facility] [address], [place], [place] [address]    Special Requests   Final    NONE Performed at [facility], [place] [address], [place], [place] [address]    Culture  Setup Time   Final    GRAM POSITIVE COCCI IN CLUSTERS IN BOTH AEROBIC AND ANAEROBIC BOTTLES CRITICAL RESULT CALLED TO, READ BACK BY AND VERIFIED WITH: Audrea Muscat 0113 09/21/2019 Mena Goes Performed at [facility], [place] [address], [place],  [address]    Culture STAPHYLOCOCCUS HOMINIS (A)  Final  Report Status 09/22/2019 FINAL  Final   Organism ID, Bacteria STAPHYLOCOCCUS HOMINIS  Final      Susceptibility   Staphylococcus hominis - MIC*    CIPROFLOXACIN <=0.5 SENSITIVE Sensitive     ERYTHROMYCIN >=8 RESISTANT Resistant     GENTAMICIN <=0.5 SENSITIVE Sensitive     OXACILLIN RESISTANT Resistant     TETRACYCLINE <=1 SENSITIVE Sensitive     VANCOMYCIN <=0.5 SENSITIVE Sensitive     TRIMETH/SULFA <=10 SENSITIVE Sensitive     CLINDAMYCIN <=0.25 SENSITIVE Sensitive     RIFAMPIN <=0.5 SENSITIVE Sensitive     Inducible Clindamycin NEGATIVE Sensitive     * STAPHYLOCOCCUS HOMINIS  SARS Coronavirus 2 by RT PCR (hospital order, performed in [facility] hospital lab) Nasopharyngeal Nasopharyngeal Swab     Status: None   Collection Time: 09/19/19 11:19 PM   Specimen: Nasopharyngeal Swab  Result Value Ref Range Status    SARS Coronavirus 2 NEGATIVE NEGATIVE Final    Comment: Performed at [facility], [place] [address], [place], [place] [address]  Blood Culture ID Panel (Reflexed)     Status: Abnormal   Collection Time: 09/19/19 11:19 PM  Result Value Ref Range Status   Enterococcus species NOT DETECTED NOT DETECTED Final   Listeria monocytogenes NOT DETECTED NOT DETECTED Final   Staphylococcus species DETECTED (A) NOT DETECTED Final    Comment: Methicillin (oxacillin) susceptible coagulase negative staphylococcus. Possible blood culture contaminant (unless isolated from more than one blood culture draw or clinical case suggests pathogenicity). No antibiotic treatment is indicated for blood  culture contaminants. CRITICAL RESULT CALLED TO, READ BACK BY AND VERIFIED WITH: J. GRIMSLEY,PHARMD 0113 09/21/2019 T. TYSOR    Staphylococcus aureus (BCID) NOT DETECTED NOT DETECTED Final   Methicillin resistance NOT DETECTED NOT DETECTED Final   Streptococcus species NOT DETECTED NOT DETECTED Final   Streptococcus agalactiae NOT DETECTED NOT DETECTED Final   Streptococcus pneumoniae NOT DETECTED NOT DETECTED Final   Streptococcus pyogenes NOT DETECTED NOT DETECTED Final   Acinetobacter baumannii NOT DETECTED NOT DETECTED Final   Enterobacteriaceae species NOT DETECTED NOT DETECTED Final   Enterobacter cloacae complex NOT DETECTED NOT DETECTED Final   Escherichia coli NOT DETECTED NOT DETECTED Final   Klebsiella oxytoca NOT DETECTED NOT DETECTED Final   Klebsiella pneumoniae NOT DETECTED NOT DETECTED Final   Proteus species NOT DETECTED NOT DETECTED Final   Serratia marcescens NOT DETECTED NOT DETECTED Final   Haemophilus influenzae NOT DETECTED NOT DETECTED Final   Neisseria meningitidis NOT DETECTED NOT DETECTED Final   Pseudomonas aeruginosa NOT DETECTED NOT DETECTED Final   Candida albicans NOT DETECTED NOT DETECTED Final   Candida glabrata NOT DETECTED NOT DETECTED Final   Candida krusei NOT  DETECTED NOT DETECTED Final   Candida parapsilosis NOT DETECTED NOT DETECTED Final   Candida tropicalis NOT DETECTED NOT DETECTED Final    Comment: Performed at [facility], [address] [place], [place] [address]    Impression/Plan:  1. Staph hominis bacteremia - now positive in 3/4 bottles.  Slow improvement.  Repeat blood cultures sent.   Continue vancomycin Can continue with doxycycline at discharge for a total of 10 days if repeat blood culture remains negative  2. Encephalopathy - more alert now with improvement of #1.    I will sign off, call with any questions.

## 2019-09-24 DIAGNOSIS — Z515 Encounter for palliative care: Secondary | ICD-10-CM

## 2019-09-24 DIAGNOSIS — Z7189 Other specified counseling: Secondary | ICD-10-CM

## 2019-09-24 LAB — CBC
HCT: 33 % — ABNORMAL LOW (ref 36.0–46.0)
Hemoglobin: 10.9 g/dL — ABNORMAL LOW (ref 12.0–15.0)
MCH: 31 pg (ref 26.0–34.0)
MCHC: 33 g/dL (ref 30.0–36.0)
MCV: 93.8 fL (ref 80.0–100.0)
Platelets: 292 10*3/uL (ref 150–400)
RBC: 3.52 MIL/uL — ABNORMAL LOW (ref 3.87–5.11)
RDW: 12.6 % (ref 11.5–15.5)
WBC: 8.4 10*3/uL (ref 4.0–10.5)
nRBC: 0 % (ref 0.0–0.2)

## 2019-09-24 LAB — POTASSIUM: Potassium: 3.6 mmol/L (ref 3.5–5.1)

## 2019-09-24 LAB — BASIC METABOLIC PANEL
Anion gap: 8 (ref 5–15)
BUN: 14 mg/dL (ref 8–23)
CO2: 21 mmol/L — ABNORMAL LOW (ref 22–32)
Calcium: 7.8 mg/dL — ABNORMAL LOW (ref 8.9–10.3)
Chloride: 107 mmol/L (ref 98–111)
Creatinine, Ser: 0.66 mg/dL (ref 0.44–1.00)
GFR calc Af Amer: >60 mL/min (ref >60)
GFR calc non Af Amer: >60 mL/min (ref >60)
Glucose, Bld: 112 mg/dL — ABNORMAL HIGH (ref 70–99)
Potassium: 2.8 mmol/L — ABNORMAL LOW (ref 3.5–5.1)
Sodium: 136 mmol/L (ref 135–145)

## 2019-09-24 LAB — MAGNESIUM: Magnesium: 1.3 mg/dL — ABNORMAL LOW (ref 1.7–2.4)

## 2019-09-24 MED ORDER — POTASSIUM CHLORIDE 10 MEQ/100ML IV SOLN
10.0000 meq | INTRAVENOUS | Status: AC
  Administered 2019-09-24 (×3): 10 meq via INTRAVENOUS
  Filled 2019-09-24: qty 200
  Filled 2019-09-24 (×2): qty 100

## 2019-09-24 MED ORDER — MAGNESIUM SULFATE 50 % IJ SOLN
4.0000 g | Freq: Once | INTRAVENOUS | Status: AC
  Administered 2019-09-24: 4 g via INTRAVENOUS
  Filled 2019-09-24: qty 8

## 2019-09-24 MED ORDER — KCL IN DEXTROSE-NACL 20-5-0.2 MEQ/L-%-% IV SOLN
INTRAVENOUS | Status: DC
  Filled 2019-09-24 (×3): qty 1000

## 2019-09-24 NOTE — Progress Notes (Signed)
  Speech Language Pathology Treatment: Dysphagia  Patient Details Name: [name] MRN: [id number] DOB: 12-Feb-1932 Today's Date: 09/24/2019 Time: [id number] SLP Time Calculation (min) (ACUTE ONLY): 25 min  Assessment / Plan / Recommendation Clinical Impression  New order received to repeat bedside swallow evaluation, as pt more alert this morning per MD and RN. Pt was awake, answering some questions, following directions inconsistently. Oral care was completed with suction. Pt accepted trials of ice chips, which she appeared to tolerate without anterior leakage or overt s/s aspiration. Trials of thin and nectar thick liquid resulted in immediate cough response. Puree texture was held orally, and not swallowed. Suction was used to clear oral cavity of puree/secretions.  While pt is more alert and cooperative today, she remains inappropriate for po intake due to cognitive state. Cough response following liquids raises concern for airway compromise. She is unable to follow commands consistently, even with visual, verbal, and tactile cues. Pt may have individual ice chips immediately following oral care, for moisture and comfort. SLP will continue to assess readiness for po intake.   HPI HPI: 86yo female admitted 09/19/19 with diarrhea, n/v, sepsis from presumed colitis. PMH: HTN, HLD, CAD/CABG, CVA, advanced dementia, failure to thrive, poor oral intake. CXR = hazy opacity RML base - consolidation vs atelectasis      SLP Plan  Continue with current plan of care       Recommendations  Diet recommendations: NPO Medication Administration: Via alternative means                Oral Care Recommendations: Oral care QID Follow up Recommendations: 24 hour supervision/assistance;Skilled Nursing facility SLP Visit Diagnosis: Dysphagia, unspecified (R13.10) Plan: Continue with current plan of care       GO              Farzana Koci B. Quentin Ore, [facility], [place] Speech Language Pathologist Office:  [phone]  Shonna Chock 09/24/2019, 9:42 AM

## 2019-09-24 NOTE — Progress Notes (Signed)
Daily Progress Note   Patient Name: [name]       Date: 09/24/2019 DOB: 1931/10/29  Age: 86 y.o. MRN#: [id number] Attending Physician: Mendel Corning, MD Primary Care Physician: Carol Ada, MD Admit Date: 09/19/2019  Reason for Consultation/Follow-up: Establishing goals of care and Psychosocial/spiritual support  Subjective: Patient laying in bed and is more awake, alert, cooperative, and conversational today. She is able to answer questions; however, not all of her answers are appropriate. She is still confused.    Length of Stay: 4  Current Medications: Scheduled Meds:  . atenolol  25 mg Oral Daily  . brimonidine  1 drop Left Eye BID  . chlorhexidine  15 mL Mouth Rinse BID  . clopidogrel  75 mg Oral Daily  . dorzolamide-timolol  1 drop Left Eye BID  . feeding supplement  1 Container Oral TID BM  . heparin  5,000 Units Subcutaneous Q8H  . latanoprost  1 drop Both Eyes QHS  . mouth rinse  15 mL Mouth Rinse q12n4p    Continuous Infusions: . dextrose 5 % and 0.2 % NaCl with KCl 20 mEq    . magnesium sulfate LVP 250-500 ml    . vancomycin 750 mg (09/24/19 1221)    PRN Meds: hydrALAZINE  Physical Exam Pulmonary:     Effort: Pulmonary effort is normal. No respiratory distress.  Chest:     Chest wall: Tenderness (mid-sternum) present.  Skin:    General: Skin is warm and dry.  Neurological:     Mental Status: She is alert.  Psychiatric:        Mood and Affect: Mood normal.        Behavior: Behavior is cooperative.             Vital Signs: BP (!) 153/56 (BP Location: Left Arm)   Pulse 76   Temp 98.7 F (37.1 C) (Oral)   Resp 17   Wt 55 kg Comment: last noted   SpO2 98%   BMI 20.18 kg/m  SpO2: SpO2: 98 % O2 Device: O2 Device: Room Air O2 Flow Rate:     Intake/output summary: No intake or output data in the 24 hours ending 09/24/19 1441 LBM: Last BM Date: (PTA) Baseline Weight: Weight: 55 kg(last noted ) Most recent weight: Weight: 55 kg(last noted )       Palliative Assessment/Data: PPS 10-20% due to PO intake    Flowsheet Rows     Most Recent Value  Intake Tab  Referral Department  Hospitalist  Unit at Time of Referral  Med/Surg Unit  Palliative Care Primary Diagnosis  Neurology  Date Notified  09/23/19  Palliative Care Type  New Palliative care  Reason for referral  Clarify Goals of Care  Date of Admission  09/19/19  Date first seen by Palliative Care  09/23/19  # of days Palliative referral response time  0 Day(s)  # of days IP prior to Palliative referral  4  Clinical Assessment  Palliative Performance Scale Score  10%  Psychosocial & Spiritual Assessment  Palliative Care Outcomes  Patient/Family meeting held?  Yes  Who was at the meeting?  granddaughter  Palliative Care Outcomes  Clarified goals of care  Patient Active Problem List   Diagnosis Date Noted  . Goals of care, counseling/discussion   . Palliative care by specialist   . Pressure injury of skin 09/21/2019  . Sepsis ([place]) 09/20/2019  . AKI (acute kidney injury) ([place]) 09/20/2019  . Colitis 09/20/2019  . Frequent PVCs 08/24/2019  . Dementia without behavioral disturbance ([place]) 08/24/2019  . History of recent fall 08/24/2019  . Hip pain 07/15/2019  . Memory loss 07/09/2018  . Vitamin B12 deficiency 07/09/2018  . Hypertensive urgency 10/10/2016  . Hypokalemia 10/10/2016  . Migraine equivalent   . Aneurysm, cerebral, nonruptured   . TIA (transient ischemic attack) 04/05/2016  . Cerebellar infarct ([place]) 02/16/2016  . Stroke (cerebrum) ([place]) 01/21/2016  . Neurological deficit present 01/21/2016  . Greater trochanteric bursitis of right hip 06/15/2015  . Hyperlipidemia LDL goal <70 05/14/2013  . Statin intolerance   . Essential hypertension    . CAD (coronary artery disease), autologous vein bypass graft 06/30/2006  . 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 female  with past medical history of stroke, osteoarthritis, non-STEMI, hypertension, dementia, CAD, basal cell carcinoma, aneurysm, hyperlipidemia admitted on 09/19/2019 with sepsis secondary to colitis.  Assessment: I have reviewed medical records including EPIC notes, labs and imaging, received report from primary RN, assessed the patient and then met with granddaughter/Christine Lilyan Punt via phone to discuss diagnosis, prognosis, GOC, EOL wishes, disposition, and options.  [name] stated that she had been to see the patient last night. She said the patient by the time she left for the evening had started talking in full sentences - asking for a drink and stating several times that she wanted to leave. [name] that last night, Ms. Machorro was not back to her baseline, but was better than the previous several days of her admission.  Explained that due to Ms. Langhorst's increased alertness this morning, Dr. Tana Coast wanted SLP to evaluate her again. Results were reviewed and explained. The SLP evaluation today still found Ms. Leiner to be at a high risk for aspiration due to her cognitive state.   Christine asked how long Ms. Bacot's hospital stay would be  - it was explained that her nutritional status is a barrier and that SLP would continue to evaluate her. Christine asked what next steps would be if Ms. Storti is not able to eat. Conversation was had around careful hand feeding vs feeding tube options. We discussed that evidence has shown that feeding tubes in patients with advanced dementia do not increase survival, prevent aspiration, or improve wound healing.  They can promote isolation and use of restraints leading to increased potential for pressure ulcers. Use of feeding tubes is not medically recommended in this population; rather,  careful hand feeding with aspiration precautions has been shown to provide the best quality of life.   We discussed patient's current illness and what it means in the larger context of patient's on-going co-morbidities. Christine asked about results from the patient's CT abd and pelvis that was completed on 09/20/19 - results reviewed and questions answered. Discussed the colitis episode and the overall impact it might have had on Ms. Hindes's body. Explained medically we are hopeful for swallowing recovery; however, Ms. Castles might have a new baseline for her dementia after this acute colitis episode. [name] would like to give at least one more day to observe for improvements.   I attempted to elicit values and goals of care from [facility]  that would be important to the patient. Conversation was had around what Ms. Dinsmore would and would not want for herself. [name] stated that Ms. Osterman would not want a ventilator for a long period of time. Christine stated Ms. Lea does have 24/7 care at home and the goal is try and get her back home when discharged, if that is appropriate.  Discussed with [name] the importance of continued conversation with family and the medical providers regarding overall plan of care and treatment options, ensuring decisions are within the context of the patient's values and GOCs.    Questions and concerns were addressed. The family was encouraged to call with questions or concerns.    Recommendations/Plan:  Continue current medical treatment  Continue full code  Family's goal is for patient to return home when discharged if appropriate - she has 24/7 care already set up  Will continue to follow-up and  provide support with navigating treatment options as patient medically improves or declines  Will speak with [name] tomorrow afternoon for continued [place] conversation  Depending on hospital course, outpatient Palliative vs hospice may be  appropriate  Goals of Care and Additional Recommendations:  Limitations on Scope of Treatment: Full Scope Treatment  Code Status:  Full code  Prognosis:   Unable to determine  Discharge Planning:  Home with Palliative? Depends on hospital course  Care plan was discussed with primary RN  Thank you for allowing the Palliative Medicine Team to assist in the care of this patient.   Total Time 35 mins Prolonged Time Billed  no       Greater than 50%  of this time was spent counseling and coordinating care related to the above assessment and plan.  Amber M. Smith FNP-BC  [place], DNP, [facility] Palliative Medicine Team Team Phone # [phone]  Pager [phone]

## 2019-09-24 NOTE — Plan of Care (Signed)
  Problem: Elimination: Goal: Will not experience complications related to urinary retention Outcome: Progressing   Problem: Safety: Goal: Ability to remain free from injury will improve Outcome: Progressing   Problem: Skin Integrity: Goal: Risk for impaired skin integrity will decrease Outcome: Progressing   Problem: Activity: Goal: Risk for activity intolerance will decrease Outcome: Not Progressing   Problem: Nutrition: Goal: Adequate nutrition will be maintained Outcome: Not Progressing   Problem: Elimination: Goal: Will not experience complications related to bowel motility Outcome: Not Progressing

## 2019-09-24 NOTE — Progress Notes (Signed)
[facility]                                                                              Patient Demographics  [name], is a 86 y.o. female, DOB - June 16, 1931, AVW:[id number]  Admit date - 09/19/2019   Admitting Physician Chauncey Mann, MD  Outpatient Primary MD for the patient is Carol Ada, MD  Outpatient specialists:   LOS - 4  days   Medical records reviewed and are as summarized below:    Chief Complaint  Patient presents with  . Diarrhea  . Emesis       Brief summary   Patient is 86 year old female with history of hypertension, hyperlipidemia, CAD, CVA, dementia presented with diarrhea, vomiting, was admitted for sepsis from presumed colitis Patient had reported vomiting and diarrhea for 2 days, unable to eat or drink anything on the day of admission, possible complaining of rectal soreness per granddaughter.  At baseline, normally able to walk with assistance, eat and usually fairly alert and awake.  Over last 24 hours prior to admission, she had been less responsive.  Per EMS, was initially hypotensive 90/50, improved with IV fluid bolus. In ED, lipase within normal limits, creatinine 2.35, alk phos 145, WBCs 23.8 lactate 2.1.  UA negative Chest x-ray hazy opacity in medial right lung base, early consolidation or atelectasis.  CT abdomen pelvis showed marked circumferential thickening and adjacent inflammation centered upon the rectum and distal sigmoid no associated pneumatosis, free air.  Differentials including infectious or inflammatory colitis.   Assessment & Plan    Principal Problem:   Sepsis ([place]), acute infectious colitis, staph hominis bacteremia - Patient met sepsis criteria at the time of admission with acute kidney injury, leukocytosis, hypotensive, lactic acidosis -Blood cultures +staph hominis in 3/4 bottles, ID consult was obtained  -Patient was placed on vancomycin cefepime and Flagyl -Per Dr. Linus Salmons, continue vancomycin, can  continue with doxycycline at discharge for total of 10 days if repeat blood cultures negative -Blood cultures 5/24 so far negative  Acute kidney injury -Creatinine 2.35 at the time of admission, baseline 0.73 on 07/17/2019 in the setting of #1, hypovolemia, ACE inhibitor -Creatinine improved to 0.6  Hypokalemia, hypomagnesemia -Potassium 2.8, magnesium 1.3, replaced IV -Also added maintenance fluid with potassium supplementation as patient is n.p.o. due to aspiration risk  Aspiration/dysphagia -Patient was very somnolent, failed aspiration evaluation on 5/25 -Today much more alert and oriented, conversing, requested SLP to reevaluate  Hypotension  -BP elevated, currently n.p.o., continue hydralazine IV as needed with parameters  -Resume atenolol once taking p.o.  Coronary artery disease -Restart beta-blocker -Continue Plavix  Dementia with delirium/acute metabolic encephalopathy - At baseline, is alert, can walk with assistance - Patient has not started Aricept, Namenda, will continue to hold -Much more alert, oriented and conversing today -Palliative medicine was consulted for goals of care due to advanced dementia, dysphagia, sepsis, debility   Code Status: Full CODE STATUS DVT Prophylaxis:   SCD's Family Communication: Goals of care meeting today  Disposition Plan:     Status is: Inpatient  Remains inpatient appropriate because:Inpatient level of care appropriate due to severity of  illness, palliative medicine goals of care pending   Dispo: The patient is from: Home              Anticipated d/c is to: Home              Anticipated d/c date is: 2 days              Patient currently is not medically stable to d/c.       Time Spent in minutes 25 minutes  Procedures:  CT abdomen  Consultants:   None  Antimicrobials:   Anti-infectives (From admission, onward)   Start     Dose/Rate Route Frequency Ordered Stop   09/23/19 1200  vancomycin (VANCOREADY) IVPB  750 mg/150 mL     750 mg 150 mL/hr over 60 Minutes Intravenous Every 24 hours 09/22/19 1106     09/22/19 1130  vancomycin (VANCOCIN) IVPB 1000 mg/200 mL premix     1,000 mg 200 mL/hr over 60 Minutes Intravenous STAT 09/22/19 1105 09/22/19 1334   09/22/19 1000  ceFEPIme (MAXIPIME) 2 g in sodium chloride 0.9 % 100 mL IVPB  Status:  Discontinued     2 g 200 mL/hr over 30 Minutes Intravenous Every 12 hours 09/22/19 0838 09/22/19 1447   09/21/19 1200  vancomycin (VANCOREADY) IVPB 500 mg/100 mL  Status:  Discontinued     500 mg 100 mL/hr over 60 Minutes Intravenous Every 36 hours 09/20/19 0449 09/20/19 0941   09/20/19 2200  ceFEPIme (MAXIPIME) 2 g in sodium chloride 0.9 % 100 mL IVPB  Status:  Discontinued     2 g 200 mL/hr over 30 Minutes Intravenous Every 24 hours 09/20/19 0449 09/22/19 0838   09/20/19 0800  metroNIDAZOLE (FLAGYL) IVPB 500 mg  Status:  Discontinued     500 mg 100 mL/hr over 60 Minutes Intravenous Every 8 hours 09/20/19 0202 09/22/19 1447   09/19/19 2245  ceFEPIme (MAXIPIME) 2 g in sodium chloride 0.9 % 100 mL IVPB     2 g 200 mL/hr over 30 Minutes Intravenous  Once 09/19/19 2236 09/19/19 2357   09/19/19 2245  metroNIDAZOLE (FLAGYL) IVPB 500 mg     500 mg 100 mL/hr over 60 Minutes Intravenous  Once 09/19/19 2236 09/20/19 0102   09/19/19 2245  vancomycin (VANCOCIN) IVPB 1000 mg/200 mL premix     1,000 mg 200 mL/hr over 60 Minutes Intravenous  Once 09/19/19 2236 09/20/19 0206         Medications  Scheduled Meds: . atenolol  25 mg Oral Daily  . brimonidine  1 drop Left Eye BID  . chlorhexidine  15 mL Mouth Rinse BID  . clopidogrel  75 mg Oral Daily  . dorzolamide-timolol  1 drop Left Eye BID  . feeding supplement  1 Container Oral TID BM  . heparin  5,000 Units Subcutaneous Q8H  . latanoprost  1 drop Both Eyes QHS  . mouth rinse  15 mL Mouth Rinse q12n4p   Continuous Infusions: . vancomycin 750 mg (09/24/19 1221)   PRN Meds:.      Subjective:    [name] was seen and examined today.  Sitting up, much more alert and oriented today, oriented to self and place "I am in the hospital ", following verbal commands, pleasant.  Overnight no acute issues.     Objective:   Vitals:   09/23/19 1055 09/23/19 1343 09/23/19 2210 09/24/19 0541  BP: (!) 164/69 (!) 130/56 128/60 (!) 153/56  Pulse: 77 76 75 76  Resp:  15 17  Temp:   97.6 F (36.4 C) 98.7 F (37.1 C)  TempSrc:    Oral  SpO2: 99%  99% 98%  Weight:       No intake or output data in the 24 hours ending 09/24/19 1431   Wt Readings from Last 3 Encounters:  09/20/19 55 kg  07/15/19 55 kg  07/14/19 55 kg    Physical Exam  General: Alert and oriented x self and place, NAD  Cardiovascular: S1 S2 clear, RRR. No pedal edema b/l  Respiratory: CTAB, no wheezing, rales or rhonchi  Gastrointestinal: Soft, nontender, nondistended, NBS  Ext: no pedal edema bilaterally  Neuro: no new deficits  Musculoskeletal: No cyanosis, clubbing  Skin: No rashes  Psych: Much more alert and oriented today, has dementia, close to baseline   Data Reviewed:  I have personally reviewed following labs and imaging studies  Micro Results Recent Results (from the past 240 hour(s))  Urine culture     Status: None   Collection Time: 09/19/19 10:23 PM   Specimen: Urine, Clean Catch  Result Value Ref Range Status   Specimen Description   Final    URINE, CLEAN CATCH Performed at [facility] [address] [place], [place] [address]    Special Requests   Final    NONE Performed at [facility], [place] [address] [place], [place] [address]    Culture   Final    NO GROWTH Performed at [facility], [place] [address], [place], [place] [address]    Report Status 09/21/2019 FINAL  Final  Blood Culture (routine x 2)     Status: Abnormal   Collection Time: 09/19/19 11:18 PM   Specimen: BLOOD  Result Value Ref Range Status   Specimen  Description   Final    BLOOD Performed at [facility] [address] [place], [place] [address]    Special Requests   Final    NONE Performed at [facility], [place] [address] [place], [place] [address]    Culture  Setup Time   Final    GRAM POSITIVE COCCI IN CLUSTERS AEROBIC BOTTLE ONLY CRITICAL RESULT CALLED TO, READ BACK BY AND VERIFIED WITH: Audrea Muscat [id number] 09/21/2019 Mena Goes Performed at [facility], [place] [address], [place], [place] [address]    Culture STAPHYLOCOCCUS HOMINIS (A)  Final   Report Status 09/22/2019 FINAL  Final  Blood Culture (routine x 2)     Status: Abnormal   Collection Time: 09/19/19 11:19 PM   Specimen: BLOOD  Result Value Ref Range Status   Specimen Description   Final    BLOOD Performed at [facility] [address], [place], [place] [address]    Special Requests   Final    NONE Performed at [facility], [place] [address], [place], [place] [address]    Culture  Setup Time   Final    GRAM POSITIVE COCCI IN CLUSTERS IN BOTH AEROBIC AND ANAEROBIC BOTTLES CRITICAL RESULT CALLED TO, READ BACK BY AND VERIFIED WITH: Audrea Muscat [phone] 09/21/2019 Mena Goes Performed at [facility], [place] [address], [place], [place] [address]    Culture STAPHYLOCOCCUS HOMINIS (A)  Final   Report Status 09/22/2019 FINAL  Final   Organism ID, Bacteria STAPHYLOCOCCUS HOMINIS  Final      Susceptibility   Staphylococcus hominis - MIC*    CIPROFLOXACIN <=0.5 SENSITIVE Sensitive  ERYTHROMYCIN >=8 RESISTANT Resistant     GENTAMICIN <=0.5 SENSITIVE Sensitive     OXACILLIN RESISTANT Resistant     TETRACYCLINE <=1 SENSITIVE Sensitive     VANCOMYCIN <=0.5 SENSITIVE Sensitive     TRIMETH/SULFA <=10 SENSITIVE Sensitive     CLINDAMYCIN <=0.25 SENSITIVE Sensitive     RIFAMPIN <=0.5 SENSITIVE Sensitive     Inducible Clindamycin NEGATIVE Sensitive     * STAPHYLOCOCCUS HOMINIS   SARS Coronavirus 2 by RT PCR (hospital order, performed in [facility] hospital lab) Nasopharyngeal Nasopharyngeal Swab     Status: None   Collection Time: 09/19/19 11:19 PM   Specimen: Nasopharyngeal Swab  Result Value Ref Range Status   SARS Coronavirus 2 NEGATIVE NEGATIVE Final    Comment: Performed at [facility], [place] [address], [place], [place] [address]  Blood Culture ID Panel (Reflexed)     Status: Abnormal   Collection Time: 09/19/19 11:19 PM  Result Value Ref Range Status   Enterococcus species NOT DETECTED NOT DETECTED Final   Listeria monocytogenes NOT DETECTED NOT DETECTED Final   Staphylococcus species DETECTED (A) NOT DETECTED Final    Comment: Methicillin (oxacillin) susceptible coagulase negative staphylococcus. Possible blood culture contaminant (unless isolated from more than one blood culture draw or clinical case suggests pathogenicity). No antibiotic treatment is indicated for blood  culture contaminants. CRITICAL RESULT CALLED TO, READ BACK BY AND VERIFIED WITH: J. GRIMSLEY,PHARMD [id number] 09/21/2019 T. TYSOR    Staphylococcus aureus (BCID) NOT DETECTED NOT DETECTED Final   Methicillin resistance NOT DETECTED NOT DETECTED Final   Streptococcus species NOT DETECTED NOT DETECTED Final   Streptococcus agalactiae NOT DETECTED NOT DETECTED Final   Streptococcus pneumoniae NOT DETECTED NOT DETECTED Final   Streptococcus pyogenes NOT DETECTED NOT DETECTED Final   Acinetobacter baumannii NOT DETECTED NOT DETECTED Final   Enterobacteriaceae species NOT DETECTED NOT DETECTED Final   Enterobacter cloacae complex NOT DETECTED NOT DETECTED Final   Escherichia coli NOT DETECTED NOT DETECTED Final   Klebsiella oxytoca NOT DETECTED NOT DETECTED Final   Klebsiella pneumoniae NOT DETECTED NOT DETECTED Final   Proteus species NOT DETECTED NOT DETECTED Final   Serratia marcescens NOT DETECTED NOT DETECTED Final   Haemophilus influenzae NOT DETECTED NOT DETECTED  Final   Neisseria meningitidis NOT DETECTED NOT DETECTED Final   Pseudomonas aeruginosa NOT DETECTED NOT DETECTED Final   Candida albicans NOT DETECTED NOT DETECTED Final   Candida glabrata NOT DETECTED NOT DETECTED Final   Candida krusei NOT DETECTED NOT DETECTED Final   Candida parapsilosis NOT DETECTED NOT DETECTED Final   Candida tropicalis NOT DETECTED NOT DETECTED Final    Comment: Performed at [facility], [address], [place], [place] [address]  Culture, blood (Routine X 2) w Reflex to ID Panel     Status: None (Preliminary result)   Collection Time: 09/22/19  2:02 PM   Specimen: BLOOD LEFT HAND  Result Value Ref Range Status   Specimen Description   Final    BLOOD LEFT HAND Performed at [facility] [address] [place], [place] [address]    Special Requests   Final    BOTTLES DRAWN AEROBIC ONLY Blood Culture adequate volume Performed at [facility] [address], [place], [place] [address]    Culture   Final    NO GROWTH 2 DAYS Performed at [facility] [address], [place], [place] [address]    Report Status PENDING  Incomplete  Radiology Reports CT ABDOMEN PELVIS WO CONTRAST  Result Date: 09/20/2019 CLINICAL DATA:  Nausea and vomiting EXAM: CT ABDOMEN AND PELVIS WITHOUT CONTRAST TECHNIQUE: Multidetector CT imaging of the abdomen and pelvis was performed following the standard protocol without IV contrast. COMPARISON:  CT 07/15/2019 FINDINGS: Lower chest: Small to moderate right pleural effusion with some adjacent passive atelectasis. Atelectatic changes present the left lung base as well. Normal heart size. No pericardial effusion. Atherosclerotic calcifications noted upon the coronary arteries as well as the included portions of the thoracic aorta. No pericardial effusion. Hepatobiliary: No visible liver lesions. Smooth liver surface contour. Normal hepatic attenuation. Mild distension of the gallbladder  with dependently layering calcified gallstones towards the gallbladder neck. No visible intraductal gallstones or biliary ductal dilatation is seen. Pancreas: Diffuse pancreatic atrophy. No concerning pancreatic lesions, a inflammation, or ductal dilatation. Spleen: Normal spleen. Small accessory splenule towards the hilum. Adrenals/Urinary Tract: Normal adrenal glands. Stable appearance of a fluid attenuation 3 cm cyst in the posterior right interpolar kidney no concerning renal mass. No urolithiasis or hydronephrosis. Urinary bladder is largely decompressed at the time of exam and therefore poorly evaluated by CT imaging. No gross bladder abnormality. Stomach/Bowel: Distal esophagus, stomach and duodenal sweep are unremarkable. No small bowel wall thickening or dilatation. No evidence of obstruction. Few sparse small bowel diverticula are noted no visible small bowel diverticular inflammation is seen however no proximal colonic wall thickening or dilatation. There is marked distal colonic inflammatory change extending in contiguous fashion from the level of the rectum through the distal sigmoid. Extensive stranding and inflammatory change noted in the perirectal and pericolonic fat. No free fluid or free air. No associated pneumatosis. Vascular/Lymphatic: Atherosclerotic calcifications throughout the abdominal aorta and branch vessels. No aneurysm or ectasia. No enlarged abdominopelvic lymph nodes. Reproductive: Normal appearance of the uterus and adnexal structures. Other: Inflammatory changes centered upon the rectosigmoid as above. Postsurgical changes noted in lateral left hip soft tissues. No abdominopelvic free air or fluid. No bowel containing hernia. Musculoskeletal: Multilevel degenerative changes are present in the imaged portions of the spine. Retrolisthesis of L2 on L3 and grade 1 anterolisthesis L4 on L5 is similar to prior. Surgical anchors noted in the right greater trochanter. Additional  degenerative changes noted in both hips. Sclerotic changes thickening at the symphysis pubis are chronic and may reflect sequela of prior osteitis pubis. IMPRESSION: 1. Marked circumferential thickening and adjacent inflammation centered upon the rectum and distal sigmoid. No free fluid or free air. No associated pneumatosis. Differential considerations include infectious or inflammatory colitis versus less likely ischemic colitis. 2. Small to moderate right pleural effusion with some adjacent passive atelectasis. 3. Cholelithiasis without CT evidence of acute cholecystitis. 4. Aortic Atherosclerosis (ICD10-I70.0). Electronically Signed   By: Lovena Le M.D.   On: 09/20/2019 00:09   DG Lumbar Spine Complete  Result Date: 09/01/2019 CLINICAL DATA:  Hip pain after fall. EXAM: LUMBAR SPINE - COMPLETE 4+ VIEW COMPARISON:  July 14, 2019. FINDINGS: Mild grade 1 retrolisthesis of L1-2 and L2-3 is noted secondary to moderate degenerative disc disease at these levels. Mild grade 1 anterolisthesis of L4-5 is noted secondary to posterior facet joint hypertrophy. Severe degenerative disc disease is noted at L5-S1. No fracture is noted. Atherosclerosis of abdominal aorta is noted. IMPRESSION: Multilevel degenerative disc disease. No acute abnormality seen in the lumbar spine. Aortic Atherosclerosis (ICD10-I70.0). Electronically Signed   By: Marijo Conception M.D.   On: 09/01/2019 13:34   CT Head Wo Contrast  Result  Date: 09/01/2019 CLINICAL DATA:  Unwitnessed fall EXAM: CT HEAD WITHOUT CONTRAST TECHNIQUE: Contiguous axial images were obtained from the base of the skull through the vertex without intravenous contrast. COMPARISON:  07/14/2019 FINDINGS: Brain: No evidence of acute infarction, hemorrhage, hydrocephalus, extra-axial collection or mass lesion/mass effect. Extensive low-density changes within the periventricular and subcortical white matter compatible with chronic microvascular ischemic change. Mild-moderate  diffuse cerebral volume loss. Vascular: Atherosclerotic calcifications involving the large vessels of the skull base. No unexpected hyperdense vessel. Skull: Normal. Negative for fracture or focal lesion. Sinuses/Orbits: No acute finding. Other: None. IMPRESSION: 1.  No acute intracranial findings. 2. Chronic microvascular ischemic change and cerebral volume loss, stable from prior. Electronically Signed   By: Davina Poke D.O.   On: 09/01/2019 14:15   DG Chest Port 1 View  Result Date: 09/19/2019 CLINICAL DATA:  Sepsis, nausea vomiting and diarrhea with decreased appetite since Thursday. EXAM: PORTABLE CHEST 1 VIEW COMPARISON:  Radiograph 07/14/2019 FINDINGS: There is some hazy opacity in the medial right lung base, increased from prior. Remaining portions the lungs are predominantly clear. No pneumothorax or effusion. No convincing features of edema. Postsurgical changes related to prior CABG including aligned sternotomy wires and multiple surgical clips projecting over the mediastinum. Inferior most sternal suture is fractured, similar to comparison. The aorta is calcified. The remaining cardiomediastinal contours are unremarkable. Implantable loop recorder is noted. No acute osseous or soft tissue abnormality. Degenerative changes are present in the imaged spine and shoulders. IMPRESSION: 1. Hazy opacity in the medial right lung base, increased from prior. Findings could reflect early consolidation or atelectasis. 2. Stable postoperative changes related to prior CABG. Inferior most sternal suture is fractured, similar to comparison. 3.  Aortic Atherosclerosis (ICD10-I70.0). Electronically Signed   By: Lovena Le M.D.   On: 09/19/2019 23:12   DG Hips Bilat W or Wo Pelvis 3-4 Views  Result Date: 09/01/2019 CLINICAL DATA:  Status post fall, left hip pain EXAM: DG HIP (WITH OR WITHOUT PELVIS) 3-4V BILAT COMPARISON:  None. FINDINGS: Generalized osteopenia. No acute fracture or dislocation. 2 bone anchors  are present in the right greater trochanter likely from gluteus tendon repair. Mild joint space narrowing of bilateral hips consistent with mild osteoarthritis. No aggressive osseous lesion. Lower lumbar spine spondylosis. Mild osteoarthritis of bilateral SI joints. Degenerative changes of the pubic symphysis. IMPRESSION: No acute osseous injury of bilateral hips. Given the patient's age and osteopenia, if there is persistent clinical concern for an occult hip fracture, a MRI of the hip is recommended for increased sensitivity. Electronically Signed   By: Kathreen Devoid   On: 09/01/2019 13:33    Lab Data:  CBC: Recent Labs  Lab 09/20/19 0537 09/21/19 0517 09/22/19 0457 09/23/19 0555 09/24/19 0505  WBC 19.2* 12.6* 12.2* 9.3 8.4  HGB 11.9* 10.7* 11.0* 10.5* 10.9*  HCT 37.1 33.5* 33.5* 32.6* 33.0*  MCV 97.1 96.3 94.6 94.5 93.8  PLT 423* 361 323 290 250   Basic Metabolic Panel: Recent Labs  Lab 09/19/19 2044 09/19/19 2044 09/20/19 0537 09/21/19 0517 09/22/19 0457 09/23/19 0555 09/24/19 0505 09/24/19 0717  NA 142   < > 141 139 139 138 136  --   K 4.4   < > 4.3 3.6 3.2* 3.3* 2.8*  --   CL 105   < > 108 108 111 108 107  --   CO2 22   < > 22 23 20* 22 21*  --   GLUCOSE 158*   < > 136* 120*  141* 116* 112*  --   BUN 38*   < > 44* 40* 24* 16 14  --   CREATININE 2.35*   < > 2.42* 1.47* 0.94 0.85 0.66  --   CALCIUM 9.0   < > 8.5* 8.2* 8.0* 7.9* 7.8*  --   MG 2.2  --   --   --   --   --   --  1.3*  PHOS 5.9*  --   --   --   --   --   --   --    < > = values in this interval not displayed.   GFR: Estimated Creatinine Clearance: 43.8 mL/min (by C-G formula based on SCr of 0.66 mg/dL). Liver Function Tests: Recent Labs  Lab 09/19/19 2044 09/20/19 0537 09/21/19 0517 09/22/19 0457 09/23/19 0555  AST 44* 40 46* 36 29  ALT '26 24 22 20 17  ' ALKPHOS 145* 126 110 96 93  BILITOT 1.1 0.9 0.6 0.6 0.7  PROT 6.4* 5.5* 5.3* 5.0* 4.9*  ALBUMIN 3.1* 2.8* 2.5* 2.4* 2.3*   Recent Labs  Lab  09/19/19 2044  LIPASE 16   No results for input(s): AMMONIA in the last 168 hours. Coagulation Profile: Recent Labs  Lab 09/19/19 2318  INR 1.3*   Cardiac Enzymes: No results for input(s): CKTOTAL, CKMB, CKMBINDEX, TROPONINI in the last 168 hours. BNP (last 3 results) No results for input(s): PROBNP in the last 8760 hours. HbA1C: No results for input(s): HGBA1C in the last 72 hours. CBG: Recent Labs  Lab 09/22/19 0504  GLUCAP 117*   Lipid Profile: No results for input(s): CHOL, HDL, LDLCALC, TRIG, CHOLHDL, LDLDIRECT in the last 72 hours. Thyroid Function Tests: No results for input(s): TSH, T4TOTAL, FREET4, T3FREE, THYROIDAB in the last 72 hours. Anemia Panel: No results for input(s): VITAMINB12, FOLATE, FERRITIN, TIBC, IRON, RETICCTPCT in the last 72 hours. Urine analysis:    Component Value Date/Time   COLORURINE YELLOW 09/19/2019 2044   APPEARANCEUR CLEAR 09/19/2019 2044   APPEARANCEUR Clear 01/27/2016 1115   LABSPEC 1.016 09/19/2019 2044   PHURINE 5.0 09/19/2019 2044   GLUCOSEU NEGATIVE 09/19/2019 2044   HGBUR NEGATIVE 09/19/2019 2044   BILIRUBINUR NEGATIVE 09/19/2019 2044   BILIRUBINUR Negative 01/27/2016 [place] 09/19/2019 2044   PROTEINUR NEGATIVE 09/19/2019 2044   NITRITE NEGATIVE 09/19/2019 2044   LEUKOCYTESUR NEGATIVE 09/19/2019 2044     Rayyan Orsborn M.D. [facility] 09/24/2019, 2:31 PM   Call night coverage person covering after 7pm

## 2019-09-25 ENCOUNTER — Other Ambulatory Visit: Payer: Self-pay | Admitting: Internal Medicine

## 2019-09-25 DIAGNOSIS — R1310 Dysphagia, unspecified: Secondary | ICD-10-CM

## 2019-09-25 LAB — AMMONIA: Ammonia: 54 umol/L — ABNORMAL HIGH (ref 9–35)

## 2019-09-25 LAB — BASIC METABOLIC PANEL
Anion gap: 9 (ref 5–15)
BUN: 9 mg/dL (ref 8–23)
CO2: 23 mmol/L (ref 22–32)
Calcium: 7.6 mg/dL — ABNORMAL LOW (ref 8.9–10.3)
Chloride: 102 mmol/L (ref 98–111)
Creatinine, Ser: 0.55 mg/dL (ref 0.44–1.00)
GFR calc Af Amer: >60 mL/min (ref >60)
GFR calc non Af Amer: >60 mL/min (ref >60)
Glucose, Bld: 113 mg/dL — ABNORMAL HIGH (ref 70–99)
Potassium: 3.7 mmol/L (ref 3.5–5.1)
Sodium: 134 mmol/L — ABNORMAL LOW (ref 135–145)

## 2019-09-25 LAB — MAGNESIUM: Magnesium: 1.9 mg/dL (ref 1.7–2.4)

## 2019-09-25 MED ORDER — SODIUM CHLORIDE 0.9 % IV SOLN: INTRAVENOUS | Status: AC

## 2019-09-25 NOTE — Progress Notes (Signed)
  Speech Language Pathology Treatment: Dysphagia  Patient Details Name: [name] MRN: [id number] DOB: 1932-03-19 Today's Date: 09/25/2019 Time: [id number] SLP Time Calculation (min) (ACUTE ONLY): 31 min  Assessment / Plan / Recommendation Clinical Impression  Today pt with improved mentation, but not consistently following directions. Her voice is clear when she speaks but she is off topic and is disoriented *presumed due to her dementia.  With hand over hand assist, pt accepted intake of ice cream, nectar thick juice, single graham cracker bolus and water.  Hand over hand assist improved pt's participation, however intake remained limited.  Decreased awareness to boluses noted with delayed swallow.  Subtle cough apparent after thin liquids and after consumption of few boluses of ice cream - that may indicative airway infiltration. Pt did not accept more than a few boluses - which she required max encouragement to consume.    She remains high aspiration and malnutrition risk due to her current level of dysphagia - suspect exacerbation of baseline due to her medical status of colitis.     Following testing, SLP contacted [name], pt's granddaughter and reviewed findings of session after which she inquired as to what is done if pt can't meet nutritional needs. Reviewed comfort feeding concept to meet pt in her current state and accommodate her if she desires po intake.  Also reviewed lack of research supporting feeding tubes for people with cognitive deficits over the age of 33.  [name] said she was at work and was becoming stressed out and thus did not desire to talk about it anymore.  SLP informed [name] that SLP was calling to inform her of results of session and to educate her = not to upset her.    Paged palliative care NP [name] and discussed findings of session and conversation with granddaughter.  Unfortunately, pt's swallowing ability has NOT improved significantly to allow po for  nutrition.  Feasible option would be po for comfort/enjoyment instead of nutrition of full liquids, nectar thick - icec ream, etc allowed - to maximize comfort and mitigate aspiration. Informed RN of recommendations and provided swallow precaution sign to be posted if comfort po is desired.   [name]   HPI HPI: 86yo female admitted 09/19/19 with diarrhea, n/v, sepsis from presumed colitis. PMH: HTN, HLD, CAD/CABG, CVA, advanced dementia, failure to thrive, poor oral intake. CXR = hazy opacity RML base - consolidation vs atelectasis  Pt has been followed by palliative and NP for palliative has educated family to lack of medical gains with tube feeding for pt's over the age of 76 with cognitive deficits.         SLP Plan  Continue with current plan of care       Recommendations  Diet recommendations: NPO(vs comfort po of full liquids- nectar thick, allowing popsicles, icecream, etc for comfort) Medication Administration: Via alternative means                Oral Care Recommendations: Oral care QID Follow up Recommendations: 24 hour supervision/assistance;Skilled Nursing facility SLP Visit Diagnosis: Dysphagia, unspecified (R13.10) Plan: Continue with current plan of care       [name] 09/25/2019, 2:16 PM   Erica Lime, MS [place] Office [phone]

## 2019-09-25 NOTE — Progress Notes (Signed)
PROGRESS NOTE    [name]    Code Status: Full Code  KPV:[id number] DOB: Jan 22, 1932 DOA: 09/19/2019 LOS: 5 days  PCP: Carol Ada, MD CC:  Chief Complaint  Patient presents with  . Diarrhea  . Emesis       Hospital Summary   Patient is 86 year old female with history of hypertension, hyperlipidemia, CAD, CVA, dementia presented with diarrhea, vomiting, was admitted for sepsis from presumed colitis Patient had reported vomiting and diarrhea for 2 days, unable to eat or drink anything on the day of admission, possible complaining of rectal soreness per granddaughter.  At baseline, normally able to walk with assistance, eat and usually fairly alert and awake.  Over last 24 hours prior to admission, she had been less responsive.  Per EMS, was initially hypotensive 90/50, improved with IV fluid bolus. In ED, lipase within normal limits, creatinine 2.35, alk phos 145, WBCs 23.8 lactate 2.1.  UA negative Chest x-ray hazy opacity in medial right lung base, early consolidation or atelectasis.  CT abdomen pelvis showed marked circumferential thickening and adjacent inflammation centered upon the rectum and distal sigmoid no associated pneumatosis, free air.  Differentials including infectious or inflammatory colitis  Blood cultures positive for staph hominis and patient was started on vancomycin.  ID was consulted and plan was to continue vancomycin until discharge at which time she would be changed to doxycycline for total 10 days treatment.  Currently in talks with palliative care and family.  A & P   Principal Problem:   Sepsis ([place]) Active Problems:   CAD (coronary artery disease), autologous vein bypass graft   Essential hypertension   Hyperlipidemia LDL goal <70   Memory loss   Dementia without behavioral disturbance ([name])   AKI (acute kidney injury) ([place])   Colitis   Pressure injury of skin   Goals of care, counseling/discussion   Palliative care by  specialist   1. Sepsis, multifactorial: Acute infectious colitis, staph hominis bacteremia a. Sepsis has resolved b. Continue vancomycin until patient is ready for discharge and transition to doxycycline for total 10 days  2. Dementia with delirium/acute metabolic encephalopathy/failure to thrive a. At baseline she is alert and can walk with assistance however that does not seem to be at baseline b. CT had negative on 5/3 c. Likely from poor p.o. intake and sepsis/bacteremia d. Continue to treat bacteremia e. We will continue talks with palliative and family and SLP eval f. We will give IV fluids throughout the night g. Discussion with patient's granddaughter regarding G-tube recommended against at this point h. will check ammonia level  3. Hyponatremia from poor p.o. intake a. IV fluids  4. Po kalemia/hypomagnesemia a. Resolved  5. Aspiration/dysphagia a. Continue with SLP and palliative b. Would not recommend G-tube at this point as this increases her risk of aspiration and puts her through the surgery, continue with IV fluids and encouraging p.o. intake  6. Hypotension a. Resolved  7. CAD a. Continue beta-blocker and Plavix  DVT prophylaxis: SCDs Family Communication: Patient's granddaughter has been updated  Disposition Plan:  Status is: Inpatient  Remains inpatient appropriate because:Unsafe d/c plan   Dispo: The patient is from: Home              Anticipated d/c is to: TBD              Anticipated d/c date is: > 3 days              Patient currently is not  medically stable to d/c.           Pressure injury documentation   Pressure Injury 09/20/19 Sacrum Mid Stage 2 -  Partial thickness loss of dermis presenting as a shallow open injury with a red, pink wound bed without slough. (Active)  09/20/19 2300  Location: Sacrum  Location Orientation: Mid  Staging: Stage 2 -  Partial thickness loss of dermis presenting as a shallow open injury with a red, pink  wound bed without slough.  Wound Description (Comments):   Present on Admission: Yes     Consultants  Plan of care ID   Procedures  None  Antibiotics   Anti-infectives (From admission, onward)   Start     Dose/Rate Route Frequency Ordered Stop   09/23/19 1200  vancomycin (VANCOREADY) IVPB 750 mg/150 mL     750 mg 150 mL/hr over 60 Minutes Intravenous Every 24 hours 09/22/19 1106     09/22/19 1130  vancomycin (VANCOCIN) IVPB 1000 mg/200 mL premix     1,000 mg 200 mL/hr over 60 Minutes Intravenous STAT 09/22/19 1105 09/22/19 1334   09/22/19 1000  ceFEPIme (MAXIPIME) 2 g in sodium chloride 0.9 % 100 mL IVPB  Status:  Discontinued     2 g 200 mL/hr over 30 Minutes Intravenous Every 12 hours 09/22/19 0838 09/22/19 1447   09/21/19 1200  vancomycin (VANCOREADY) IVPB 500 mg/100 mL  Status:  Discontinued     500 mg 100 mL/hr over 60 Minutes Intravenous Every 36 hours 09/20/19 0449 09/20/19 0941   09/20/19 2200  ceFEPIme (MAXIPIME) 2 g in sodium chloride 0.9 % 100 mL IVPB  Status:  Discontinued     2 g 200 mL/hr over 30 Minutes Intravenous Every 24 hours 09/20/19 0449 09/22/19 0838   09/20/19 0800  metroNIDAZOLE (FLAGYL) IVPB 500 mg  Status:  Discontinued     500 mg 100 mL/hr over 60 Minutes Intravenous Every 8 hours 09/20/19 0202 09/22/19 1447   09/19/19 2245  ceFEPIme (MAXIPIME) 2 g in sodium chloride 0.9 % 100 mL IVPB     2 g 200 mL/hr over 30 Minutes Intravenous  Once 09/19/19 2236 09/19/19 2357   09/19/19 2245  metroNIDAZOLE (FLAGYL) IVPB 500 mg     500 mg 100 mL/hr over 60 Minutes Intravenous  Once 09/19/19 2236 09/20/19 0102   09/19/19 2245  vancomycin (VANCOCIN) IVPB 1000 mg/200 mL premix     1,000 mg 200 mL/hr over 60 Minutes Intravenous  Once 09/19/19 2236 09/20/19 0206        Subjective   Patient resting comfortably today however unable to provide a good history due to her mental status.  No overnight events  Objective   Vitals:   09/25/19 0534 09/25/19 1323  09/25/19 1324 09/25/19 1814  BP: (!) 163/83 (!) 148/130 (!) 145/68   Pulse: 81 81 82   Resp: 16  19   Temp: (!) 97.4 F (36.3 C) 99 F (37.2 C) 99 F (37.2 C)   TempSrc: Oral Oral Oral   SpO2: (!) 80% 95% 98%   Weight:      Height:    '5\' 4"'  (1.626 m)    Intake/Output Summary (Last 24 hours) at 09/25/2019 1819 Last data filed at 09/24/2019 1900 Gross per 24 hour  Intake 200.84 ml  Output 500 ml  Net -299.16 ml   Filed Weights   09/20/19 0300  Weight: 55 kg    Examination:  Physical Exam Vitals and nursing note reviewed.  Constitutional:  General: She is not in acute distress. HENT:     Head: Normocephalic.     Mouth/Throat:     Mouth: Mucous membranes are dry.  Eyes:     Conjunctiva/sclera: Conjunctivae normal.  Cardiovascular:     Rate and Rhythm: Normal rate and regular rhythm.  Pulmonary:     Effort: Pulmonary effort is normal. No respiratory distress.  Abdominal:     General: Abdomen is flat. There is no distension.  Musculoskeletal:        General: No swelling or tenderness.  Neurological:     Mental Status: She is alert. She is disoriented.  Psychiatric:        Mood and Affect: Mood normal.     Data Reviewed: I have personally reviewed following labs and imaging studies  CBC: Recent Labs  Lab 09/20/19 0537 09/21/19 0517 09/22/19 0457 09/23/19 0555 09/24/19 0505  WBC 19.2* 12.6* 12.2* 9.3 8.4  HGB 11.9* 10.7* 11.0* 10.5* 10.9*  HCT 37.1 33.5* 33.5* 32.6* 33.0*  MCV 97.1 96.3 94.6 94.5 93.8  PLT 423* 361 323 290 919   Basic Metabolic Panel: Recent Labs  Lab 09/19/19 2044 09/20/19 0537 09/21/19 0517 09/21/19 0517 09/22/19 0457 09/23/19 0555 09/24/19 0505 09/24/19 0717 09/24/19 1702 09/25/19 0551  NA 142   < > 139  --  139 138 136  --   --  134*  K 4.4   < > 3.6   < > 3.2* 3.3* 2.8*  --  3.6 3.7  CL 105   < > 108  --  111 108 107  --   --  102  CO2 22   < > 23  --  20* 22 21*  --   --  23  GLUCOSE 158*   < > 120*  --  141* 116*  112*  --   --  113*  BUN 38*   < > 40*  --  24* 16 14  --   --  9  CREATININE 2.35*   < > 1.47*  --  0.94 0.85 0.66  --   --  0.55  CALCIUM 9.0   < > 8.2*  --  8.0* 7.9* 7.8*  --   --  7.6*  MG 2.2  --   --   --   --   --   --  1.3*  --  1.9  PHOS 5.9*  --   --   --   --   --   --   --   --   --    < > = values in this interval not displayed.   GFR: Estimated Creatinine Clearance: 43.6 mL/min (by C-G formula based on SCr of 0.55 mg/dL). Liver Function Tests: Recent Labs  Lab 09/19/19 2044 09/20/19 0537 09/21/19 0517 09/22/19 0457 09/23/19 0555  AST 44* 40 46* 36 29  ALT '26 24 22 20 17  ' ALKPHOS 145* 126 110 96 93  BILITOT 1.1 0.9 0.6 0.6 0.7  PROT 6.4* 5.5* 5.3* 5.0* 4.9*  ALBUMIN 3.1* 2.8* 2.5* 2.4* 2.3*   Recent Labs  Lab 09/19/19 2044  LIPASE 16   No results for input(s): AMMONIA in the last 168 hours. Coagulation Profile: Recent Labs  Lab 09/19/19 2318  INR 1.3*   Cardiac Enzymes: No results for input(s): CKTOTAL, CKMB, CKMBINDEX, TROPONINI in the last 168 hours. BNP (last 3 results) No results for input(s): PROBNP in the last 8760 hours. HbA1C: No results for input(s): HGBA1C in the  last 72 hours. CBG: Recent Labs  Lab 09/22/19 0504  GLUCAP 117*   Lipid Profile: No results for input(s): CHOL, HDL, LDLCALC, TRIG, CHOLHDL, LDLDIRECT in the last 72 hours. Thyroid Function Tests: No results for input(s): TSH, T4TOTAL, FREET4, T3FREE, THYROIDAB in the last 72 hours. Anemia Panel: No results for input(s): VITAMINB12, FOLATE, FERRITIN, TIBC, IRON, RETICCTPCT in the last 72 hours. Sepsis Labs: Recent Labs  Lab 09/19/19 2224 09/20/19 0111  LATICACIDVEN 2.1* 1.6    Recent Results (from the past 240 hour(s))  Urine culture     Status: None   Collection Time: 09/19/19 10:23 PM   Specimen: Urine, Clean Catch  Result Value Ref Range Status   Specimen Description   Final    URINE, CLEAN CATCH Performed at [facility], [place] [address] [place], [place] [address]    Special Requests   Final    NONE Performed at [facility], [place] [address], [place], [place] [address]    Culture   Final    NO GROWTH Performed at [facility], [place] [address] [place], [place] [address]    Report Status 09/21/2019 FINAL  Final  Blood Culture (routine x 2)     Status: Abnormal   Collection Time: 09/19/19 11:18 PM   Specimen: BLOOD  Result Value Ref Range Status   Specimen Description   Final    BLOOD Performed at [facility] [facility] [address], [place], [place] [address]    Special Requests   Final    NONE Performed at [facility], [place] [address], [place], [place] [address]    Culture  Setup Time   Final    GRAM POSITIVE COCCI IN CLUSTERS AEROBIC BOTTLE ONLY CRITICAL RESULT CALLED TO, READ BACK BY AND VERIFIED WITH: Audrea Muscat [id number] 09/21/2019 Mena Goes Performed at [facility], [place] [address], [place], [place] [address]    Culture STAPHYLOCOCCUS HOMINIS (A)  Final   Report Status 09/22/2019 FINAL  Final  Blood Culture (routine x 2)     Status: Abnormal   Collection Time: 09/19/19 11:19 PM   Specimen: BLOOD  Result Value Ref Range Status   Specimen Description   Final    BLOOD Performed at [place] [address] [place], [place] [address]    Special Requests   Final    NONE Performed at [facility], [place] [address], [place], [place] [address]    Culture  Setup Time   Final    GRAM POSITIVE COCCI IN CLUSTERS IN BOTH AEROBIC AND ANAEROBIC BOTTLES CRITICAL RESULT CALLED TO, READ BACK BY AND VERIFIED WITH: Audrea Muscat 0113 09/21/2019 Mena Goes Performed at [facility], [place] [address] [place], [place] [address]    Culture STAPHYLOCOCCUS HOMINIS (A)  Final   Report Status 09/22/2019 FINAL  Final   Organism ID, Bacteria STAPHYLOCOCCUS HOMINIS  Final      Susceptibility   Staphylococcus  hominis - MIC*    CIPROFLOXACIN <=0.5 SENSITIVE Sensitive     ERYTHROMYCIN >=8 RESISTANT Resistant     GENTAMICIN <=0.5 SENSITIVE Sensitive     OXACILLIN RESISTANT Resistant     TETRACYCLINE <=1 SENSITIVE Sensitive     VANCOMYCIN <=0.5 SENSITIVE Sensitive     TRIMETH/SULFA <=10 SENSITIVE Sensitive     CLINDAMYCIN <=0.25 SENSITIVE Sensitive     RIFAMPIN <=0.5 SENSITIVE Sensitive     Inducible Clindamycin NEGATIVE Sensitive     *  STAPHYLOCOCCUS HOMINIS  SARS Coronavirus 2 by RT PCR (hospital order, performed in [facility] hospital lab) Nasopharyngeal Nasopharyngeal Swab     Status: None   Collection Time: 09/19/19 11:19 PM   Specimen: Nasopharyngeal Swab  Result Value Ref Range Status   SARS Coronavirus 2 NEGATIVE NEGATIVE Final    Comment: Performed at [facility], [place] [address], [place], [place] [address]  Blood Culture ID Panel (Reflexed)     Status: Abnormal   Collection Time: 09/19/19 11:19 PM  Result Value Ref Range Status   Enterococcus species NOT DETECTED NOT DETECTED Final   Listeria monocytogenes NOT DETECTED NOT DETECTED Final   Staphylococcus species DETECTED (A) NOT DETECTED Final    Comment: Methicillin (oxacillin) susceptible coagulase negative staphylococcus. Possible blood culture contaminant (unless isolated from more than one blood culture draw or clinical case suggests pathogenicity). No antibiotic treatment is indicated for blood  culture contaminants. CRITICAL RESULT CALLED TO, READ BACK BY AND VERIFIED WITH: J. GRIMSLEY,PHARMD 0113 09/21/2019 T. TYSOR    Staphylococcus aureus (BCID) NOT DETECTED NOT DETECTED Final   Methicillin resistance NOT DETECTED NOT DETECTED Final   Streptococcus species NOT DETECTED NOT DETECTED Final   Streptococcus agalactiae NOT DETECTED NOT DETECTED Final   Streptococcus pneumoniae NOT DETECTED NOT DETECTED Final   Streptococcus pyogenes NOT DETECTED NOT DETECTED Final   Acinetobacter baumannii NOT DETECTED NOT  DETECTED Final   Enterobacteriaceae species NOT DETECTED NOT DETECTED Final   Enterobacter cloacae complex NOT DETECTED NOT DETECTED Final   Escherichia coli NOT DETECTED NOT DETECTED Final   Klebsiella oxytoca NOT DETECTED NOT DETECTED Final   Klebsiella pneumoniae NOT DETECTED NOT DETECTED Final   Proteus species NOT DETECTED NOT DETECTED Final   Serratia marcescens NOT DETECTED NOT DETECTED Final   Haemophilus influenzae NOT DETECTED NOT DETECTED Final   Neisseria meningitidis NOT DETECTED NOT DETECTED Final   Pseudomonas aeruginosa NOT DETECTED NOT DETECTED Final   Candida albicans NOT DETECTED NOT DETECTED Final   Candida glabrata NOT DETECTED NOT DETECTED Final   Candida krusei NOT DETECTED NOT DETECTED Final   Candida parapsilosis NOT DETECTED NOT DETECTED Final   Candida tropicalis NOT DETECTED NOT DETECTED Final    Comment: Performed at [facility], [address], [place], [place] [address]  Culture, blood (Routine X 2) w Reflex to ID Panel     Status: None (Preliminary result)   Collection Time: 09/22/19  2:02 PM   Specimen: BLOOD LEFT HAND  Result Value Ref Range Status   Specimen Description   Final    BLOOD LEFT HAND Performed at [facility] [address] [place], [place] [address]    Special Requests   Final    BOTTLES DRAWN AEROBIC ONLY Blood Culture adequate volume Performed at [facility] [address] [place], [place] [address]    Culture   Final    NO GROWTH 3 DAYS Performed at [facility], [place] [address] [place], [place] [address]    Report Status PENDING  Incomplete         Radiology Studies: No results found.      Scheduled Meds: . atenolol  25 mg Oral Daily  . brimonidine  1 drop Left Eye BID  . chlorhexidine  15 mL Mouth Rinse BID  . clopidogrel  75 mg Oral Daily  . dorzolamide-timolol  1 drop Left Eye BID  . feeding supplement  1 Container Oral TID BM  .  heparin  5,000 Units  Subcutaneous Q8H  . latanoprost  1 drop Both Eyes QHS  . mouth rinse  15 mL Mouth Rinse q12n4p   Continuous Infusions: . dextrose 5 % and 0.2 % NaCl with KCl 20 mEq 60 mL/hr at 09/25/19 0809  . vancomycin 750 mg (09/25/19 1200)     Time spent: 35 minutes with over 50% of the time coordinating the patient's care    Harold Hedge, DO [facility] Pager [phone]  Call night coverage person covering after 7pm

## 2019-09-25 NOTE — TOC Initial Note (Signed)
Transition of Care [facility]) - Initial/Assessment Note    Patient Details  Name: [name] MRN: [id number] Date of Birth: 1931-11-06  Transition [facility] CM/SW Contact:    Lynnell Catalan, RN Phone Number: 09/25/2019, 9:58 AM  Clinical Narrative:                 TOC following for dc planning. Pt from home with granddaughter and 24hr caregivers. Per [place] with Palliative Medicine Team, plan is for pt to go back home with granddaughter at dc. PMT is continuing conversations around [place]. Pt may need referral for outpatient palliative services at dc. TOC will continue to follow.     Expected Discharge Plan: Home/Self Care, caregivers, granddaughter. Barriers to Discharge: Continued Medical Work up   Expected Discharge Plan and Services Expected Discharge Plan: Home/Self Care     Prior Living Arrangements/Services   Lives with:: Relatives          Need for Family Participation in Patient Care: Yes (Comment) Care giver support system in place?: Yes (comment)   Criminal Activity/Legal Involvement Pertinent to Current Situation/Hospitalization: No - Comment as needed  Activities of Daily Living Home Assistive Devices/Equipment: Other (Comment), Built-in shower seat, Grab bars in shower, Walker (specify type)(walk-in shower, front wheeled walker) ADL Screening (condition at time of admission) Patient's cognitive ability adequate to safely complete daily activities?: No Is the patient deaf or have difficulty hearing?: No Does the patient have difficulty seeing, even when wearing glasses/contacts?: Yes Does the patient have difficulty concentrating, remembering, or making decisions?: Yes Patient able to express need for assistance with ADLs?: Yes Does the patient have difficulty dressing or bathing?: Yes Independently performs ADLs?: No Communication: Independent Dressing (OT): Needs assistance Is this a change from baseline?: Pre-admission baseline Grooming: Needs  assistance Is this a change from baseline?: Pre-admission baseline Feeding: Needs assistance Is this a change from baseline?: Pre-admission baseline Bathing: Needs assistance Is this a change from baseline?: Pre-admission baseline Toileting: Needs assistance Is this a change from baseline?: Pre-admission baseline In/Out Bed: Needs assistance Is this a change from baseline?: Pre-admission baseline Walks in Home: Needs assistance Is this a change from baseline?: Pre-admission baseline Does the patient have difficulty walking or climbing stairs?: Yes(secondary to weakness) Weakness of Legs: Both Weakness of Arms/Hands: None  Permission Sought/Granted                  Emotional Assessment Appearance:: Appears stated age     Orientation: : Oriented to Self, Fluctuating Orientation (Suspected and/or reported [place])      Admission diagnosis:  Colitis [K52.9] Sepsis ([place]) [A41.9] Sepsis, due to unspecified organism, unspecified whether acute organ dysfunction present [facility]) [A41.9] Patient Active Problem List   Diagnosis Date Noted  . Goals of care, counseling/discussion   . Palliative care by specialist   . Pressure injury of skin 09/21/2019  . Sepsis ([place]) 09/20/2019  . AKI (acute kidney injury) ([place]) 09/20/2019  . Colitis 09/20/2019  . Frequent PVCs 08/24/2019  . Dementia without behavioral disturbance ([place]) 08/24/2019  . History of recent fall 08/24/2019  . Hip pain 07/15/2019  . Memory loss 07/09/2018  . Vitamin B12 deficiency 07/09/2018  . Hypertensive urgency 10/10/2016  . Hypokalemia 10/10/2016  . Migraine equivalent   . Aneurysm, cerebral, nonruptured   . TIA (transient ischemic attack) 04/05/2016  . Cerebellar infarct ([place]) 02/16/2016  . Stroke (cerebrum) ([place]) 01/21/2016  . Neurological deficit present 01/21/2016  . Greater trochanteric bursitis of right hip 06/15/2015  .  Hyperlipidemia LDL goal <70 05/14/2013  . Statin intolerance   . Essential  hypertension   . CAD (coronary artery disease), autologous vein bypass graft 06/30/2006  . S/P CABG x 3 01/30/2004   PCP:  Carol Ada, MD Pharmacy:   [id number] - [place], [place]. [address] [place] [address] Phone: [phone] Fax: [phone]     Social Determinants of Health (SDOH) Interventions    Readmission Risk Interventions No flowsheet data found.

## 2019-09-25 NOTE — Progress Notes (Signed)
Pharmacy Antibiotic Note  [name] is a 86 y.o. female admitted on 09/19/2019 with diarrhea, emesis.  Pharmacy has been consulted for vancomycin and cefepime dosing. Patient found to have bacteremia - Cefepime/Flagyl d/c'd 5/24 and Vanc continued  Today, 09/25/19  - Day 3 Vanc of planned 10 days total per ID notes - WBC 8.4, stable - SCr 0.55, improved - Afebrile - 5/21 BCx: staph hominis, 5/24 repeat BCx: no growth to date  Plan:  Continue vancomycin 750 mg IV q24h fo rnow  Plan per ID, if repeat BCx continue to be negative, plan change to doxy PO at discharge for total of 10 days of vanc/doxy combo  Follow renal function, culture data. Check vancomycin levels as needed: goal VT 15-20 mcg/mL  Weight: 55 kg (121 lb 4.1 oz)(last noted )  Temp (24hrs), Avg:98 F (36.7 C), Min:97.4 F (36.3 C), Max:98.4 F (36.9 C)  Recent Labs  Lab 09/19/19 2044 09/19/19 2224 09/20/19 0111 09/20/19 0537 09/20/19 0537 09/21/19 0517 09/22/19 0457 09/23/19 0555 09/24/19 0505 09/25/19 0551  WBC   < >  --   --  19.2*  --  12.6* 12.2* 9.3 8.4  --   CREATININE   < >  --   --  2.42*   < > 1.47* 0.94 0.85 0.66 0.55  LATICACIDVEN  --  2.1* 1.6  --   --   --   --   --   --   --    < > = values in this interval not displayed.    Estimated Creatinine Clearance: 43.8 mL/min (by C-G formula based on SCr of 0.55 mg/dL).    Allergies  Allergen Reactions  . Nsaids Other (See Comments)    URINARY RETENTION  . Statins Other (See Comments)    MYALGIAS HURTS ALL OVER  . Tricor [Fenofibrate] Other (See Comments)    MYALGIAS HURTS ALL OVER  . Zetia [Ezetimibe] Other (See Comments)    MYALGIAS HURTS ALL OVER  . Lescol [Fluvastatin]     Muscle pain  . Livalo [Pitavastatin]     Muscle pain  . Niaspan [Niacin]     Severe flushing   . Relafen [Nabumetone] Rash    Antimicrobials this admission: cefepime 5/21 >> 5/24 metronidazole 5/22 >> 5/24  Vancomycin x1 on 5/22 then resumed 5/24  >>  Dose adjustments this admission: 5/24 Cefepime 2 g q24h --> 2 g IV q12h  Microbiology results: 5/21 BCx: 2/2 staphylococcus hominis (R erythromycin, oxacillin) 5/21 UCx: ngF  5/24 BCx: ngtd  Thank you for allowing pharmacy to be a part of this patient's care.  Kara Mead, PharmD 09/25/2019 8:54 AM

## 2019-09-25 NOTE — Progress Notes (Signed)
Daily Progress Note   Patient Name: [name]       Date: 09/25/2019 DOB: May 18, 1931  Age: 86 y.o. MRN#: [id number] Attending Physician: Harold Hedge, MD Primary Care Physician: Carol Ada, MD Admit Date: 09/19/2019  Reason for Consultation/Follow-up: Establishing goals of care and Psychosocial/spiritual support  Subjective: Patient seems less interactive to me today than yesterday - she is alert but verbal interaction is minimal.   Length of Stay: 5  Current Medications: Scheduled Meds:  . atenolol  25 mg Oral Daily  . brimonidine  1 drop Left Eye BID  . chlorhexidine  15 mL Mouth Rinse BID  . clopidogrel  75 mg Oral Daily  . dorzolamide-timolol  1 drop Left Eye BID  . feeding supplement  1 Container Oral TID BM  . heparin  5,000 Units Subcutaneous Q8H  . latanoprost  1 drop Both Eyes QHS  . mouth rinse  15 mL Mouth Rinse q12n4p    Continuous Infusions: . dextrose 5 % and 0.2 % NaCl with KCl 20 mEq 60 mL/hr at 09/25/19 0809  . vancomycin 750 mg (09/25/19 1200)    PRN Meds: hydrALAZINE  Physical Exam Constitutional:      General: She is not in acute distress. Pulmonary:     Effort: Pulmonary effort is normal. No respiratory distress.  Skin:    General: Skin is warm and dry.  Neurological:     Mental Status: She is alert. She is disoriented.  Psychiatric:        Behavior: Behavior is cooperative.             Vital Signs: BP (!) 145/68 (BP Location: Left Arm)   Pulse 82   Temp 99 F (37.2 C) (Oral)   Resp 19   Wt 55 kg Comment: last noted   SpO2 98%   BMI 20.18 kg/m  SpO2: SpO2: 98 % O2 Device: O2 Device: Room Air O2 Flow Rate:    Intake/output summary:   Intake/Output Summary (Last 24 hours) at 09/25/2019 1600 Last data filed at 09/24/2019 1900 Gross per  24 hour  Intake 200.84 ml  Output 500 ml  Net -299.16 ml   LBM: Last BM Date: (PTA) Baseline Weight: Weight: 55 kg(last noted ) Most recent weight: Weight: 55 kg(last noted )       Palliative Assessment/Data: PPS 10-20% due to PO intake    Flowsheet Rows     Most Recent Value  Intake Tab  Referral Department  Hospitalist  Unit at Time of Referral  Med/Surg Unit  Palliative Care Primary Diagnosis  Neurology  Date Notified  09/23/19  Palliative Care Type  New Palliative care  Reason for referral  Clarify Goals of Care  Date of Admission  09/19/19  Date first seen by Palliative Care  09/23/19  # of days Palliative referral response time  0 Day(s)  # of days IP prior to Palliative referral  4  Clinical Assessment  Palliative Performance Scale Score  10%  Psychosocial & Spiritual Assessment  Palliative Care Outcomes  Patient/Family meeting held?  Yes  Who was at the meeting?  granddaughter  Palliative Care Outcomes  Clarified goals of care  Patient Active Problem List   Diagnosis Date Noted  . Goals of care, counseling/discussion   . Palliative care by specialist   . Pressure injury of skin 09/21/2019  . Sepsis ([place]) 09/20/2019  . AKI (acute kidney injury) ([place]) 09/20/2019  . Colitis 09/20/2019  . Frequent PVCs 08/24/2019  . Dementia without behavioral disturbance ([place]) 08/24/2019  . History of recent fall 08/24/2019  . Hip pain 07/15/2019  . Memory loss 07/09/2018  . Vitamin B12 deficiency 07/09/2018  . Hypertensive urgency 10/10/2016  . Hypokalemia 10/10/2016  . Migraine equivalent   . Aneurysm, cerebral, nonruptured   . TIA (transient ischemic attack) 04/05/2016  . Cerebellar infarct ([place]) 02/16/2016  . Stroke (cerebrum) ([place]) 01/21/2016  . Neurological deficit present 01/21/2016  . Greater trochanteric bursitis of right hip 06/15/2015  . Hyperlipidemia LDL goal <70 05/14/2013  . Statin intolerance   . Essential hypertension   . CAD (coronary artery  disease), autologous vein bypass graft 06/30/2006  . 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 female  with past medical history of stroke, osteoarthritis, non-STEMI, hypertension, dementia, CAD, basal cell carcinoma, aneurysm, hyperlipidemia admitted on 09/19/2019 with sepsis secondary to colitis.  Assessment: [name] remains alert but minimally interactive.   Discussed situation with SLP - patient reevaluated today and continues to be at risk for aspiration and continues with poor PO intake. SLP briefly discussed this with granddaughter today.   Spoke with granddaughter regarding situation. We discussed patient is not eating much and high risk for aspiration - she brings up PEG tube. We discussed this in depth. We discussed that evidence has shown that PEG tubes in patients with advanced dementia do not increase survival, prevent aspiration, or improve wound healing.  They can promote isolation and use of restraints leading to increased potential for pressure ulcers. Use of PEG tubes is not medically recommended in this population; rather, careful hand feeding with aspiration precautions has been shown to provide the best quality of life.   She tells me she feels as though she is "giving up" on her grandmother if she does not pursue feeding tube. We discuss this idea of "giving up" and discuss the idea of caring for her grandmother through natural disease process of dementia. Discussed attempting to provide nutrition as her body allows in the safest way possible - sitting up straight, providing nutrition when she is most alert, providing textures she tolerates best. Working with SLP for suggestions on how to best provide nutrition through careful hand feeding. She seems to agree that careful hand feeding sounds like the best option but would like to see how patient tolerates in the hospital prior to discharge.   We also discussed patient will likely do better  in home setting where she is familiar than she will in hospital or rehab facility. Christine agrees. We discuss involving hospice for extra support at home. [name] seems to agree to this as well.   Shared conversation with Tammy SLP and Dr. Neysa Bonito.  Recommendations/Plan:  Continue current medical treatment  Continue full code  Family's goal is for patient to return home when discharged if appropriate - she has 24/7 care already set up  Hospice care at home possibly  Ongoing discussion about careful hand feeding vs feeding tube - for now agreeable to careful hand feeding  Code Status:  Full code  Prognosis:   Unable to determine  Discharge Planning:  Home with hospice?  Care  plan was discussed with primary RN, SLP, Dr Neysa Bonito, granddaughter  Thank you for allowing the Palliative Medicine Team to assist in the care of this patient.   Total Time 45 mins Prolonged Time Billed  no       Greater than 50%  of this time was spent counseling and coordinating care related to the above assessment and plan.  Juel Burrow, DNP, [facility] Palliative Medicine Team Team Phone # [phone]  Pager [phone]

## 2019-09-26 DIAGNOSIS — R1310 Dysphagia, unspecified: Secondary | ICD-10-CM

## 2019-09-26 LAB — CBC
HCT: 34.6 % — ABNORMAL LOW (ref 36.0–46.0)
Hemoglobin: 10.9 g/dL — ABNORMAL LOW (ref 12.0–15.0)
MCH: 31.1 pg (ref 26.0–34.0)
MCHC: 31.5 g/dL (ref 30.0–36.0)
MCV: 98.9 fL (ref 80.0–100.0)
Platelets: 334 10*3/uL (ref 150–400)
RBC: 3.5 MIL/uL — ABNORMAL LOW (ref 3.87–5.11)
RDW: 13 % (ref 11.5–15.5)
WBC: 9.5 10*3/uL (ref 4.0–10.5)
nRBC: 0 % (ref 0.0–0.2)

## 2019-09-26 LAB — GLUCOSE, CAPILLARY
Glucose-Capillary: 94 mg/dL (ref 70–99)
Glucose-Capillary: 93 mg/dL (ref 70–99)

## 2019-09-26 LAB — BASIC METABOLIC PANEL
Anion gap: 9 (ref 5–15)
BUN: 11 mg/dL (ref 8–23)
CO2: 20 mmol/L — ABNORMAL LOW (ref 22–32)
Calcium: 7.7 mg/dL — ABNORMAL LOW (ref 8.9–10.3)
Chloride: 105 mmol/L (ref 98–111)
Creatinine, Ser: 0.61 mg/dL (ref 0.44–1.00)
GFR calc Af Amer: >60 mL/min (ref >60)
GFR calc non Af Amer: >60 mL/min (ref >60)
Glucose, Bld: 98 mg/dL (ref 70–99)
Potassium: 3.7 mmol/L (ref 3.5–5.1)
Sodium: 134 mmol/L — ABNORMAL LOW (ref 135–145)

## 2019-09-26 LAB — MAGNESIUM: Magnesium: 1.6 mg/dL — ABNORMAL LOW (ref 1.7–2.4)

## 2019-09-26 LAB — PHOSPHORUS: Phosphorus: 2.2 mg/dL — ABNORMAL LOW (ref 2.5–4.6)

## 2019-09-26 MED ORDER — MAGNESIUM SULFATE 2 GM/50ML IV SOLN
2.0000 g | Freq: Once | INTRAVENOUS | Status: AC
  Administered 2019-09-26: 2 g via INTRAVENOUS
  Filled 2019-09-26: qty 50

## 2019-09-26 MED ORDER — RESOURCE THICKENUP CLEAR PO POWD
ORAL | Status: DC | PRN
  Filled 2019-09-26: qty 125

## 2019-09-26 MED ORDER — POTASSIUM PHOSPHATES 15 MMOLE/5ML IV SOLN
10.0000 mmol | Freq: Once | INTRAVENOUS | Status: AC
  Administered 2019-09-26: 10 mmol via INTRAVENOUS
  Filled 2019-09-26: qty 3.33

## 2019-09-26 MED ORDER — LACTULOSE ENEMA
300.0000 mL | Freq: Once | ORAL | Status: AC
  Administered 2019-09-26: 300 mL via RECTAL
  Filled 2019-09-26: qty 300

## 2019-09-26 NOTE — Care Management Important Message (Signed)
Important Message  Patient Details IM Letter given to Case Manager to present to the Patient Name: [name] MRN: [id number] Date of Birth: [birth year]   Medicare Important Message Given:  Yes     Kerin Salen 09/26/2019, 4:35 PM

## 2019-09-26 NOTE — Progress Notes (Signed)
PROGRESS NOTE    [name]    Code Status: Full Code  HYW:[id number] DOB: 11-02-1931 DOA: 09/19/2019 LOS: 6 days  PCP: Carol Ada, MD CC:  Chief Complaint  Patient presents with  . Diarrhea  . Emesis       Hospital Summary   Patient is 86 year old female with history of hypertension, hyperlipidemia, CAD, CVA, dementia presented with diarrhea, vomiting, was admitted for sepsis from presumed colitis Patient had reported vomiting and diarrhea for 2 days, unable to eat or drink anything on the day of admission, possible complaining of rectal soreness per granddaughter.  At baseline, normally able to walk with assistance, eat and usually fairly alert and awake.  Over last 24 hours prior to admission, she had been less responsive.  Per EMS, was initially hypotensive 90/50, improved with IV fluid bolus. In ED, lipase within normal limits, creatinine 2.35, alk phos 145, WBCs 23.8 lactate 2.1.  UA negative Chest x-ray hazy opacity in medial right lung base, early consolidation or atelectasis.  CT abdomen pelvis showed marked circumferential thickening and adjacent inflammation centered upon the rectum and distal sigmoid no associated pneumatosis, free air.  Differentials including infectious or inflammatory colitis  Blood cultures positive for staph hominis and patient was started on vancomycin.  ID was consulted and plan was to continue vancomycin until discharge at which time she would be changed to doxycycline for total 10 days treatment.  Currently in talks with palliative care and family.  A & P   Principal Problem:   Sepsis ([place]) Active Problems:   CAD (coronary artery disease), autologous vein bypass graft   Essential hypertension   Hyperlipidemia LDL goal <70   Memory loss   Dementia without behavioral disturbance ([name])   AKI (acute kidney injury) ([place])   Colitis   Pressure injury of skin   Goals of care, counseling/discussion   Palliative care by specialist  Dysphagia   1. Sepsis, multifactorial: Acute infectious colitis, staph hominis bacteremia a. Sepsis has resolved b. Continue vancomycin until patient is ready for discharge and transition to doxycycline for total 10 days  2. Dementia with delirium/acute metabolic encephalopathy/failure to thrive a. At baseline she is alert and can walk with assistance however that does not seem to be at baseline b. CT had unremarkable for acute pathology on 5/3 c. Likely from poor p.o. intake and sepsis/bacteremia d. Had elevated ammonia level today -> did not tolerate lactulose enema for long (not taking PO) e. Continue to treat bacteremia as above f. continue talks with palliative and family and SLP   3. Hyponatremia from poor p.o. intake a. IV fluids  4. Hypomagnesemia a. Replete  5. Hypokalemia a. Resolved  6. Hypophosphatemia a. Replete IV  7. Aspiration/dysphagia a. Continue with SLP and palliative b. Would not recommend G-tube at this point as this increases her risk of aspiration and puts her through the surgery, continue with IV fluids and encouraging p.o. intake  8. Hypotension a. Resolved  9. CAD a. Continue beta-blocker and Plavix  DVT prophylaxis: SCDs Family Communication: Patient's granddaughter has been updated on 5/27 Disposition Plan:  Status is: Inpatient  Remains inpatient appropriate because:Unsafe d/c plan   Dispo: The patient is from: Home              Anticipated d/c is to: TBD              Anticipated d/c date is: 3 days  Patient currently is not medically stable to d/c.           Pressure injury documentation   Pressure Injury 09/20/19 Sacrum Mid Stage 2 -  Partial thickness loss of dermis presenting as a shallow open injury with a red, pink wound bed without slough. (Active)  09/20/19 2300  Location: Sacrum  Location Orientation: Mid  Staging: Stage 2 -  Partial thickness loss of dermis presenting as a shallow open injury with a  red, pink wound bed without slough.  Wound Description (Comments):   Present on Admission: Yes     Consultants  Palliative  ID   Procedures  None  Antibiotics   Anti-infectives (From admission, onward)   Start     Dose/Rate Route Frequency Ordered Stop   09/23/19 1200  vancomycin (VANCOREADY) IVPB 750 mg/150 mL     750 mg 150 mL/hr over 60 Minutes Intravenous Every 24 hours 09/22/19 1106     09/22/19 1130  vancomycin (VANCOCIN) IVPB 1000 mg/200 mL premix     1,000 mg 200 mL/hr over 60 Minutes Intravenous STAT 09/22/19 1105 09/22/19 1334   09/22/19 1000  ceFEPIme (MAXIPIME) 2 g in sodium chloride 0.9 % 100 mL IVPB  Status:  Discontinued     2 g 200 mL/hr over 30 Minutes Intravenous Every 12 hours 09/22/19 0838 09/22/19 1447   09/21/19 1200  vancomycin (VANCOREADY) IVPB 500 mg/100 mL  Status:  Discontinued     500 mg 100 mL/hr over 60 Minutes Intravenous Every 36 hours 09/20/19 0449 09/20/19 0941   09/20/19 2200  ceFEPIme (MAXIPIME) 2 g in sodium chloride 0.9 % 100 mL IVPB  Status:  Discontinued     2 g 200 mL/hr over 30 Minutes Intravenous Every 24 hours 09/20/19 0449 09/22/19 0838   09/20/19 0800  metroNIDAZOLE (FLAGYL) IVPB 500 mg  Status:  Discontinued     500 mg 100 mL/hr over 60 Minutes Intravenous Every 8 hours 09/20/19 0202 09/22/19 1447   09/19/19 2245  ceFEPIme (MAXIPIME) 2 g in sodium chloride 0.9 % 100 mL IVPB     2 g 200 mL/hr over 30 Minutes Intravenous  Once 09/19/19 2236 09/19/19 2357   09/19/19 2245  metroNIDAZOLE (FLAGYL) IVPB 500 mg     500 mg 100 mL/hr over 60 Minutes Intravenous  Once 09/19/19 2236 09/20/19 0102   09/19/19 2245  vancomycin (VANCOCIN) IVPB 1000 mg/200 mL premix     1,000 mg 200 mL/hr over 60 Minutes Intravenous  Once 09/19/19 2236 09/20/19 0206        Subjective   Patient examined at bedside, unable to provide a good history due to mental status. No events overnight   Objective   Vitals:   09/26/19 0126 09/26/19 0542 09/26/19  0755 09/26/19 1338  BP: (!) 154/76 (!) 174/69 139/73 (!) 147/90  Pulse: 76 76 73 90  Resp: '15 16  14  ' Temp: 97.7 F (36.5 C)   98.3 F (36.8 C)  TempSrc: Oral   Oral  SpO2: 98% 95%  (!) 88%  Weight:      Height:        Intake/Output Summary (Last 24 hours) at 09/26/2019 1438 Last data filed at 09/26/2019 1100 Gross per 24 hour  Intake 1543.95 ml  Output --  Net 1543.95 ml   Filed Weights   09/20/19 0300  Weight: 55 kg    Examination:  Physical Exam Vitals and nursing note reviewed. Exam conducted with a chaperone present.  Constitutional:  General: She is not in acute distress. HENT:     Head: Normocephalic.  Eyes:     Conjunctiva/sclera: Conjunctivae normal.  Cardiovascular:     Rate and Rhythm: Normal rate and regular rhythm.  Pulmonary:     Effort: Pulmonary effort is normal.     Breath sounds: Normal breath sounds.  Abdominal:     General: Abdomen is flat. There is no distension.  Musculoskeletal:     Right lower leg: No edema.     Left lower leg: No edema.  Neurological:     Mental Status: She is alert.     Comments: Expressive aphasia similar to yesterday Unable to assess orientation     Data Reviewed: I have personally reviewed following labs and imaging studies  CBC: Recent Labs  Lab 09/21/19 0517 09/22/19 0457 09/23/19 0555 09/24/19 0505 09/26/19 0541  WBC 12.6* 12.2* 9.3 8.4 9.5  HGB 10.7* 11.0* 10.5* 10.9* 10.9*  HCT 33.5* 33.5* 32.6* 33.0* 34.6*  MCV 96.3 94.6 94.5 93.8 98.9  PLT 361 323 290 292 308   Basic Metabolic Panel: Recent Labs  Lab 09/19/19 2044 09/20/19 0537 09/22/19 0457 09/22/19 0457 09/23/19 0555 09/24/19 0505 09/24/19 0717 09/24/19 1702 09/25/19 0551 09/26/19 0541  NA 142   < > 139  --  138 136  --   --  134* 134*  K 4.4   < > 3.2*   < > 3.3* 2.8*  --  3.6 3.7 3.7  CL 105   < > 111  --  108 107  --   --  102 105  CO2 22   < > 20*  --  22 21*  --   --  23 20*  GLUCOSE 158*   < > 141*  --  116* 112*  --    --  113* 98  BUN 38*   < > 24*  --  16 14  --   --  9 11  CREATININE 2.35*   < > 0.94  --  0.85 0.66  --   --  0.55 0.61  CALCIUM 9.0   < > 8.0*  --  7.9* 7.8*  --   --  7.6* 7.7*  MG 2.2  --   --   --   --   --  1.3*  --  1.9 1.6*  PHOS 5.9*  --   --   --   --   --   --   --   --  2.2*   < > = values in this interval not displayed.   GFR: Estimated Creatinine Clearance: 43.6 mL/min (by C-G formula based on SCr of 0.61 mg/dL). Liver Function Tests: Recent Labs  Lab 09/19/19 2044 09/20/19 0537 09/21/19 0517 09/22/19 0457 09/23/19 0555  AST 44* 40 46* 36 29  ALT '26 24 22 20 17  ' ALKPHOS 145* 126 110 96 93  BILITOT 1.1 0.9 0.6 0.6 0.7  PROT 6.4* 5.5* 5.3* 5.0* 4.9*  ALBUMIN 3.1* 2.8* 2.5* 2.4* 2.3*   Recent Labs  Lab 09/19/19 2044  LIPASE 16   Recent Labs  Lab 09/25/19 1852  AMMONIA 54*   Coagulation Profile: Recent Labs  Lab 09/19/19 2318  INR 1.3*   Cardiac Enzymes: No results for input(s): CKTOTAL, CKMB, CKMBINDEX, TROPONINI in the last 168 hours. BNP (last 3 results) No results for input(s): PROBNP in the last 8760 hours. HbA1C: No results for input(s): HGBA1C in the last 72 hours. CBG: Recent Labs  Lab  09/22/19 0504 09/26/19 1202  GLUCAP 117* 94   Lipid Profile: No results for input(s): CHOL, HDL, LDLCALC, TRIG, CHOLHDL, LDLDIRECT in the last 72 hours. Thyroid Function Tests: No results for input(s): TSH, T4TOTAL, FREET4, T3FREE, THYROIDAB in the last 72 hours. Anemia Panel: No results for input(s): VITAMINB12, FOLATE, FERRITIN, TIBC, IRON, RETICCTPCT in the last 72 hours. Sepsis Labs: Recent Labs  Lab 09/19/19 2224 09/20/19 0111  LATICACIDVEN 2.1* 1.6    Recent Results (from the past 240 hour(s))  Urine culture     Status: None   Collection Time: 09/19/19 10:23 PM   Specimen: Urine, Clean Catch  Result Value Ref Range Status   Specimen Description   Final    URINE, CLEAN CATCH Performed at [facility], [place] [address], [place], [place] [address]    Special Requests   Final    NONE Performed at [facility], [place] [address], [place], [place] [address]    Culture   Final    NO GROWTH Performed at [facility], [place] [address], [place], [place] [address]    Report Status 09/21/2019 FINAL  Final  Blood Culture (routine x 2)     Status: Abnormal   Collection Time: 09/19/19 11:18 PM   Specimen: BLOOD  Result Value Ref Range Status   Specimen Description   Final    BLOOD Performed at [place] [address], [place], [place] [address]    Special Requests   Final    NONE Performed at [facility], [place] [address] [place], [place] [address]    Culture  Setup Time   Final    GRAM POSITIVE COCCI IN CLUSTERS AEROBIC BOTTLE ONLY CRITICAL RESULT CALLED TO, READ BACK BY AND VERIFIED WITH: Audrea Muscat [id number] 09/21/2019 Mena Goes Performed at [facility], [place] [address], [place], [place] [address]    Culture STAPHYLOCOCCUS HOMINIS (A)  Final   Report Status 09/22/2019 FINAL  Final  Blood Culture (routine x 2)     Status: Abnormal   Collection Time: 09/19/19 11:19 PM   Specimen: BLOOD  Result Value Ref Range Status   Specimen Description   Final    BLOOD Performed at [place] [address] [place], [place] [address]    Special Requests   Final    NONE Performed at [facility], [place] [address] [place], [place] [address]    Culture  Setup Time   Final    GRAM POSITIVE COCCI IN CLUSTERS IN BOTH AEROBIC AND ANAEROBIC BOTTLES CRITICAL RESULT CALLED TO, READ BACK BY AND VERIFIED WITH: Audrea Muscat [id number] 09/21/2019 Mena Goes Performed at [facility], [place] [address], [place], [place] [address]    Culture STAPHYLOCOCCUS HOMINIS (A)  Final   Report Status 09/22/2019 FINAL  Final   Organism ID, Bacteria STAPHYLOCOCCUS HOMINIS  Final      Susceptibility   Staphylococcus  hominis - MIC*    CIPROFLOXACIN <=0.5 SENSITIVE Sensitive     ERYTHROMYCIN >=8 RESISTANT Resistant     GENTAMICIN <=0.5 SENSITIVE Sensitive     OXACILLIN RESISTANT Resistant     TETRACYCLINE <=1 SENSITIVE Sensitive     VANCOMYCIN <=0.5 SENSITIVE Sensitive     TRIMETH/SULFA <=10 SENSITIVE Sensitive     CLINDAMYCIN <=0.25 SENSITIVE Sensitive     RIFAMPIN <=0.5 SENSITIVE Sensitive     Inducible Clindamycin NEGATIVE Sensitive     * STAPHYLOCOCCUS HOMINIS  SARS Coronavirus 2 by RT PCR (hospital order, performed in [facility] hospital lab) Nasopharyngeal Nasopharyngeal Swab     Status: None   Collection Time: 09/19/19 11:19 PM   Specimen: Nasopharyngeal Swab  Result Value Ref Range Status   SARS Coronavirus 2 NEGATIVE NEGATIVE Final    Comment: Performed at [facility], [place] [address], [place], [place] [address]  Blood Culture ID Panel (Reflexed)     Status: Abnormal   Collection Time: 09/19/19 11:19 PM  Result Value Ref Range Status   Enterococcus species NOT DETECTED NOT DETECTED Final   Listeria monocytogenes NOT DETECTED NOT DETECTED Final   Staphylococcus species DETECTED (A) NOT DETECTED Final    Comment: Methicillin (oxacillin) susceptible coagulase negative staphylococcus. Possible blood culture contaminant (unless isolated from more than one blood culture draw or clinical case suggests pathogenicity). No antibiotic treatment is indicated for blood  culture contaminants. CRITICAL RESULT CALLED TO, READ BACK BY AND VERIFIED WITH: J. GRIMSLEY,PHARMD 0113 09/21/2019 T. TYSOR    Staphylococcus aureus (BCID) NOT DETECTED NOT DETECTED Final   Methicillin resistance NOT DETECTED NOT DETECTED Final   Streptococcus species NOT DETECTED NOT DETECTED Final   Streptococcus agalactiae NOT DETECTED NOT DETECTED Final   Streptococcus pneumoniae NOT DETECTED NOT DETECTED Final   Streptococcus pyogenes NOT DETECTED NOT DETECTED Final   Acinetobacter baumannii NOT DETECTED NOT  DETECTED Final   Enterobacteriaceae species NOT DETECTED NOT DETECTED Final   Enterobacter cloacae complex NOT DETECTED NOT DETECTED Final   Escherichia coli NOT DETECTED NOT DETECTED Final   Klebsiella oxytoca NOT DETECTED NOT DETECTED Final   Klebsiella pneumoniae NOT DETECTED NOT DETECTED Final   Proteus species NOT DETECTED NOT DETECTED Final   Serratia marcescens NOT DETECTED NOT DETECTED Final   Haemophilus influenzae NOT DETECTED NOT DETECTED Final   Neisseria meningitidis NOT DETECTED NOT DETECTED Final   Pseudomonas aeruginosa NOT DETECTED NOT DETECTED Final   Candida albicans NOT DETECTED NOT DETECTED Final   Candida glabrata NOT DETECTED NOT DETECTED Final   Candida krusei NOT DETECTED NOT DETECTED Final   Candida parapsilosis NOT DETECTED NOT DETECTED Final   Candida tropicalis NOT DETECTED NOT DETECTED Final    Comment: Performed at [facility], [address], [place], [place] [address]  Culture, blood (Routine X 2) w Reflex to ID Panel     Status: None (Preliminary result)   Collection Time: 09/22/19  2:02 PM   Specimen: BLOOD LEFT HAND  Result Value Ref Range Status   Specimen Description   Final    BLOOD LEFT HAND Performed at [place] [address] [place], [place] [address]    Special Requests   Final    BOTTLES DRAWN AEROBIC ONLY Blood Culture adequate volume Performed at [place] [address], [place], [place] [address]    Culture   Final    NO GROWTH 4 DAYS Performed at [facility], [place] [address] [place], [place] [address]    Report Status PENDING  Incomplete  Culture, blood (Routine X 2) w Reflex to ID Panel     Status: None (Preliminary result)   Collection Time: 09/22/19  2:21 PM   Specimen: BLOOD LEFT HAND  Result Value Ref Range Status   Specimen Description   Final    BLOOD LEFT HAND Performed at [place] [address] [place], [place] [address]     Special Requests   Final  BOTTLES DRAWN AEROBIC ONLY Blood Culture results may not be optimal due to an inadequate volume of blood received in culture bottles Performed at [facility], [place] [address] [place], [place] [address]    Culture   Final    NO GROWTH 4 DAYS Performed at [facility], [place] [address] [place], [place] [address]    Report Status PENDING  Incomplete         Radiology Studies: No results found.      Scheduled Meds: . atenolol  25 mg Oral Daily  . brimonidine  1 drop Left Eye BID  . chlorhexidine  15 mL Mouth Rinse BID  . clopidogrel  75 mg Oral Daily  . dorzolamide-timolol  1 drop Left Eye BID  . feeding supplement  1 Container Oral TID BM  . heparin  5,000 Units Subcutaneous Q8H  . latanoprost  1 drop Both Eyes QHS  . mouth rinse  15 mL Mouth Rinse q12n4p   Continuous Infusions: . potassium PHOSPHATE IVPB (in mmol) 10 mmol (09/26/19 1045)  . vancomycin 750 mg (09/26/19 1335)     Time spent: 26 minutes with over 50% of the time coordinating the patient's care    Harold Hedge, DO [facility] Pager [phone]  Call night coverage person covering after 7pm

## 2019-09-26 NOTE — Progress Notes (Signed)
  Speech Language Pathology Treatment: Dysphagia  Patient Details Name: [name] MRN: [id number] DOB: Aug 13, 1931 Today's Date: 09/26/2019 Time: [id number] SLP Time Calculation (min) (ACUTE ONLY): 15 min  Assessment / Plan / Recommendation Clinical Impression  No overt clinical indication of aspiration with po observed today - however pt continues with delayed swallow and weakness.  She does not follow directions consistently - approx 25% of opportunities.  Use of straw today facilitated improved timing of swallow today.    Given oral holding inconsistently and pt's full code status, would not recommend thin with meals - due to risk for premature spillage and aspiration before the swallow.  She did accept a few more boluses than yesterday but intake remained poor.  Poor mastication effort with pt swallowing small bolus of solid likely whole with icecream thus do not recommend solids.    Anticipate pt's swallow performance to wax/wane and ulitimately she remains aspiration and malnutrition risk.  In speaking with MD, plan for diet to migitate discomfort/potential aspiration and maximize swallow efficiency.  Nectar full liquid diet placed and precautions posted.    Will follow up Monday to see how pt is progressing and readiness for advancement especially given pt not comfort care.  Thanks.   HPI HPI: 86yo female admitted 09/19/19 with diarrhea, n/v, sepsis from presumed colitis. PMH: HTN, HLD, CAD/CABG, CVA, advanced dementia, failure to thrive, poor oral intake. CXR = hazy opacity RML base - consolidation vs atelectasis  Pt has been lethargic and not accepting intake and SLP has spoken to granddaughter re: dementia/dysphagia. etc  Follow up for dysphagia management, education indicated.      SLP Plan  Continue with current plan of care       Recommendations  Diet recommendations: Nectar-thick liquid(full liquids - nectar and thin water between meals) Liquids provided via:  Straw Medication Administration: Via alternative means Supervision: Staff to assist with self feeding Compensations: Slow rate;Small sips/bites;Other (Comment)(assure pt swallows before giving more) Postural Changes and/or Swallow Maneuvers: Seated upright 90 degrees;Upright 30-60 min after meal                Oral Care Recommendations: Oral care QID Follow up Recommendations: 24 hour supervision/assistance;Skilled Nursing facility SLP Visit Diagnosis: Dysphagia, unspecified (R13.10) Plan: Continue with current plan of care       GO                Macario Golds 09/26/2019, 10:44 AM  Kathleen Lime, MS [place] Office [phone]

## 2019-09-26 NOTE — Plan of Care (Signed)
Pt BP rechecked this am and WNL.  Pt did not respond to prompts/questions regarding pain but is oriented to self and verbal this am.   Problem: Elimination: Goal: Will not experience complications related to urinary retention Outcome: Progressing   Problem: Safety: Goal: Ability to remain free from injury will improve Outcome: Progressing   Problem: Skin Integrity: Goal: Risk for impaired skin integrity will decrease Outcome: Progressing   Problem: Activity: Goal: Risk for activity intolerance will decrease Outcome: Not Progressing   Problem: Nutrition: Goal: Adequate nutrition will be maintained Outcome: Not Progressing   Problem: Elimination: Goal: Will not experience complications related to bowel motility Outcome: Not Progressing

## 2019-09-26 NOTE — Progress Notes (Signed)
Physical Therapy Treatment Patient Details Name: [name] MRN: [id number] DOB: May 25, 1931 Today's Date: 09/26/2019    History of Present Illness 86 year old female with history of hypertension, hyperlipidemia, CAD, CVA, dementia presented with diarrhea, vomiting, was admitted for sepsis from presumed colitis    PT Comments    Patient alert today and maintained eyes open throughout session. She was able to follow verbal/tactile cues for sequencing bed mob and transfers ~40% of time today but required increased time. Pt had difficulty initiating mobility for UE's/LE's to begin rolling and supine<>sit transfer. Pt required MAX assist to sit up EOB and is reliant on external support in sitting to stand. Patient able to complete 2x sit<>stand with 2+ Max assist to maintain balance. She will continue to benefit from skilled PT interventions to address impairments and progress mobility.    Follow Up Recommendations  SNF     Equipment Recommendations  None recommended by PT    Recommendations for Other Services       Precautions / Restrictions Precautions Precautions: Fall Precaution Comments: fragile skin Restrictions Weight Bearing Restrictions: No    Mobility  Bed Mobility Overal bed mobility: Needs Assistance Bed Mobility: Supine to Sit;Sit to Supine;Rolling Rolling: Max assist   Supine to sit: Max assist;HOB elevated Sit to supine: Max assist;+2 for safety/equipment;+2 for physical assistance   General bed mobility comments: pt required verbal/tactile cues to initiate UE reaching to bed rail and assist with bed pad to shift hips towards EOB. pt required cues to bring LE's off EOB. MAX assist to raise trunk upright and bring LE's off EOB. Max assist requried for hand placement to stabilize balance at EOB with Bil UE support, min guard with intermittent assist to steady. Max assist with 2+ to control trunk lowering and raise LE's into bed to return to supine. Pt requried  assist to sequenc rolling in bed to place dry pads.   Transfers Overall transfer level: Needs assistance Equipment used: 2 person hand held assist Transfers: Sit to/from Stand Sit to Stand: Max assist;+2 safety/equipment;+2 physical assistance;From elevated surface         General transfer comment: pt required verbal cues to encourage initiation to stand (counting down from 3 worked). Pt required MAX assist +2 with support at elbows/forearms  and gati belt to steady and assist with power up. pt performed 2x from EOB.  Ambulation/Gait        Stairs             Wheelchair Mobility    Modified Rankin (Stroke Patients Only)       Balance Overall balance assessment: Needs assistance Sitting-balance support: Bilateral upper extremity supported;Feet supported Sitting balance-Leahy Scale: Poor               Cognition Arousal/Alertness: Awake/alert Behavior During Therapy: Flat affect Overall Cognitive Status: No family/caregiver present to determine baseline cognitive functioning        General Comments: pt wtih hx of dementia. pt keeing eyes open and remained alert. pt responded apprpriately to questions ~25% of time with "yes" "okay" "sounds good". pt also talking in short sentences off subject/task at hand or questions askes ~75% of time.      Exercises      General Comments        Pertinent Vitals/Pain Pain Assessment: Faces Faces Pain Scale: Hurts little more Pain Location: neck pain - with repositioning Pain Descriptors / Indicators: Grimacing;Discomfort Pain Intervention(s): Limited activity within patient's tolerance;Monitored during session;Repositioned  Prior Function            PT Goals (current goals can now be found in the care plan section) Acute Rehab PT Goals PT Goal Formulation: Patient unable to participate in goal setting Time For Goal Achievement: 10/06/19 Potential to Achieve Goals: Fair Progress towards PT goals:  Progressing toward goals    Frequency    Min 2X/week      PT Plan Current plan remains appropriate       AM-PAC PT "6 Clicks" Mobility   Outcome Measure  Help needed turning from your back to your side while in a flat bed without using bedrails?: Total Help needed moving from lying on your back to sitting on the side of a flat bed without using bedrails?: Total Help needed moving to and from a bed to a chair (including a wheelchair)?: Total Help needed standing up from a chair using your arms (e.g., wheelchair or bedside chair)?: Total Help needed to walk in hospital room?: Total Help needed climbing 3-5 steps with a railing? : Total 6 Click Score: 6    End of Session Equipment Utilized During Treatment: Gait belt Activity Tolerance: Patient tolerated treatment well Patient left: in bed;with call bell/phone within reach;with bed alarm set;with nursing/sitter in room Nurse Communication: Mobility status PT Visit Diagnosis: Difficulty in walking, not elsewhere classified (R26.2);Adult, failure to thrive (R62.7)     Time: [id number] PT Time Calculation (min) (ACUTE ONLY): 24 min  Charges:  $Therapeutic Activity: 23-37 mins                     Verner Mould, DPT Physical Therapist with [facility] [phone]  09/26/2019 12:00 PM

## 2019-09-26 NOTE — Progress Notes (Signed)
Daily Progress Note   Patient Name: [name]       Date: 09/26/2019 DOB: 1931/11/10  Age: 86 y.o. MRN#: [id number] Attending Physician: Erica Hedge, MD Primary Care Physician: Erica Ada, MD Admit Date: 09/19/2019  Reason for Consultation/Follow-up: Establishing goals of care and Psychosocial/spiritual support  Subjective: Patient sleeping, does not wake to voice. Per nurse she worked with PT earlier - stood at bedside with assistance. Still with very poor PO intake. Diet has been advanced to full liquids but not taking much in.   Length of Stay: 6  Current Medications: Scheduled Meds:  . atenolol  25 mg Oral Daily  . brimonidine  1 drop Left Eye BID  . chlorhexidine  15 mL Mouth Rinse BID  . clopidogrel  75 mg Oral Daily  . dorzolamide-timolol  1 drop Left Eye BID  . feeding supplement  1 Container Oral TID BM  . heparin  5,000 Units Subcutaneous Q8H  . latanoprost  1 drop Both Eyes QHS  . mouth rinse  15 mL Mouth Rinse q12n4p    Continuous Infusions: . potassium PHOSPHATE IVPB (in mmol) 10 mmol (09/26/19 1045)  . vancomycin 750 mg (09/26/19 1335)    PRN Meds: hydrALAZINE, Resource ThickenUp Clear  Physical Exam Constitutional:      General: She is not in acute distress.    Comments: sleeping  Pulmonary:     Effort: Pulmonary effort is normal. No respiratory distress.  Skin:    General: Skin is warm and dry.  Psychiatric:        Cognition and Memory: Cognition is impaired.             Vital Signs: BP (!) 147/90 (BP Location: Left Arm) Comment: RN notified   Pulse 90   Temp 98.3 F (36.8 C) (Oral)   Resp 14   Ht 5\' 4"  (1.626 m)   Wt 55 kg Comment: last noted   SpO2 (!) 88%   BMI 20.81 kg/m  SpO2: SpO2: (!) 88 % O2 Device: O2 Device: Room Air O2 Flow Rate:      Intake/output summary:   Intake/Output Summary (Last 24 hours) at 09/26/2019 1408 Last data filed at 09/26/2019 1100 Gross per 24 hour  Intake 1543.95 ml  Output --  Net 1543.95 ml   LBM: Last BM Date: (PTA) Baseline Weight: Weight: 55 kg(last noted ) Most recent weight: Weight: 55 kg(last noted )       Palliative Assessment/Data: PPS 10-20% due to PO intake    Flowsheet Rows     Most Recent Value  Intake Tab  Referral Department  Hospitalist  Unit at Time of Referral  Med/Surg Unit  Palliative Care Primary Diagnosis  Neurology  Date Notified  09/23/19  Palliative Care Type  New Palliative care  Reason for referral  Clarify Goals of Care  Date of Admission  09/19/19  Date first seen by Palliative Care  09/23/19  # of days Palliative referral response time  0 Day(s)  # of days IP prior to Palliative referral  4  Clinical Assessment  Palliative Performance Scale Score  10%  Psychosocial & Spiritual Assessment  Palliative Care Outcomes  Patient/Family meeting held?  Yes  Who was at the meeting?  granddaughter  Palliative Care Outcomes  Clarified goals of care      Patient Active Problem List   Diagnosis Date Noted  . Dysphagia   . Goals of care, counseling/discussion   . Palliative care by specialist   . Pressure injury of skin 09/21/2019  . Sepsis ([place]) 09/20/2019  . AKI (acute kidney injury) ([place]) 09/20/2019  . Colitis 09/20/2019  . Frequent PVCs 08/24/2019  . Dementia without behavioral disturbance ([place]) 08/24/2019  . History of recent fall 08/24/2019  . Hip pain 07/15/2019  . Memory loss 07/09/2018  . Vitamin B12 deficiency 07/09/2018  . Hypertensive urgency 10/10/2016  . Hypokalemia 10/10/2016  . Migraine equivalent   . Aneurysm, cerebral, nonruptured   . TIA (transient ischemic attack) 04/05/2016  . Cerebellar infarct ([place]) 02/16/2016  . Stroke (cerebrum) ([place]) 01/21/2016  . Neurological deficit present 01/21/2016  . Greater trochanteric bursitis  of right hip 06/15/2015  . Hyperlipidemia LDL goal <70 05/14/2013  . Statin intolerance   . Essential hypertension   . CAD (coronary artery disease), autologous vein bypass graft 06/30/2006  . 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 female  with past medical history of stroke, osteoarthritis, non-STEMI, hypertension, dementia, CAD, basal cell carcinoma, aneurysm, hyperlipidemia admitted on 09/19/2019 with sepsis secondary to colitis.  Assessment: Follow up call with [name] today - we again reviewed patient situation. Reviewed how patient did with PT - requiring max assist for mobility. Also reviewed SLP eval - discussed [name] is still at risk for impaired nutrition/hydration and aspiration. Ultimately, not much change - no improvement or decline. [name] shares information she has been told by other providers - she feels she has a good understanding of the situation.   We discussed discharge planning - [name] plans to reach out to home health aids she has to verify that they will be able to provide the level of care [name] needs at home. We also discussed hospice support at home. [name] is agreeable to hospice liaison reaching out to her to discuss type of support that could be offered.   Lastly, we discussed code status. Discussed that resuscitation attempts would likely not benefit [name] and possibly cause more suffering. Discussed these attempts would not change her advanced dementia/dysphagia/failure to thrive. DNR was recommended. [name] expresses understanding but states she needs more time to think about it. She does state "I definitely wouldn't want her on a ventilator" - we discussed that if she would not want her on a ventilator then CPR would not be recommended as she would most likely be on a ventilator if she were to survive CPR. Again, [name] requests some time to consider this longer prior to changing code status.    Recommendations/Plan:  DNR recommended - granddaughter requests time to consider - maintain full code for now  Granddaughter requests education from SLP about safest diet at home, how to administer medications  - SLP consulted  TOC referral for hospice liaison to reach out to granddaughter  Code Status:  Full code  Prognosis:   Unable to determine - months  Discharge Planning:  Home with hospice?  Care plan was discussed with primary RN, Dr Neysa Bonito, granddaughter  Thank you for allowing the Palliative Medicine Team to assist in the care of this patient.   Total Time 25 mins Prolonged Time Billed  no       Greater than 50%  of this time was spent counseling and coordinating care related to the above assessment and plan.   Juel Burrow, DNP, [facility] Palliative Medicine Team Team Phone # [phone]  Pager [phone]

## 2019-09-27 DIAGNOSIS — E869 Volume depletion, unspecified: Secondary | ICD-10-CM

## 2019-09-27 DIAGNOSIS — R7881 Bacteremia: Secondary | ICD-10-CM

## 2019-09-27 LAB — CBC
HCT: 30.2 % — ABNORMAL LOW (ref 36.0–46.0)
Hemoglobin: 9.6 g/dL — ABNORMAL LOW (ref 12.0–15.0)
MCH: 30.2 pg (ref 26.0–34.0)
MCHC: 31.8 g/dL (ref 30.0–36.0)
MCV: 95 fL (ref 80.0–100.0)
Platelets: 376 10*3/uL (ref 150–400)
RBC: 3.18 MIL/uL — ABNORMAL LOW (ref 3.87–5.11)
RDW: 13.1 % (ref 11.5–15.5)
WBC: 10 10*3/uL (ref 4.0–10.5)
nRBC: 0 % (ref 0.0–0.2)

## 2019-09-27 LAB — CULTURE, BLOOD (ROUTINE X 2)
Culture: NO GROWTH
Culture: NO GROWTH
Special Requests: ADEQUATE

## 2019-09-27 LAB — GLUCOSE, CAPILLARY
Glucose-Capillary: 107 mg/dL — ABNORMAL HIGH (ref 70–99)
Glucose-Capillary: 143 mg/dL — ABNORMAL HIGH (ref 70–99)
Glucose-Capillary: 94 mg/dL (ref 70–99)

## 2019-09-27 LAB — BASIC METABOLIC PANEL
Anion gap: 9 (ref 5–15)
BUN: 14 mg/dL (ref 8–23)
CO2: 20 mmol/L — ABNORMAL LOW (ref 22–32)
Calcium: 8 mg/dL — ABNORMAL LOW (ref 8.9–10.3)
Chloride: 105 mmol/L (ref 98–111)
Creatinine, Ser: 0.71 mg/dL (ref 0.44–1.00)
GFR calc Af Amer: >60 mL/min (ref >60)
GFR calc non Af Amer: >60 mL/min (ref >60)
Glucose, Bld: 100 mg/dL — ABNORMAL HIGH (ref 70–99)
Potassium: 3.1 mmol/L — ABNORMAL LOW (ref 3.5–5.1)
Sodium: 134 mmol/L — ABNORMAL LOW (ref 135–145)

## 2019-09-27 LAB — PHOSPHORUS: Phosphorus: 3.1 mg/dL (ref 2.5–4.6)

## 2019-09-27 LAB — MAGNESIUM: Magnesium: 1.8 mg/dL (ref 1.7–2.4)

## 2019-09-27 LAB — AMMONIA: Ammonia: 33 umol/L (ref 9–35)

## 2019-09-27 MED ORDER — LACTATED RINGERS IV SOLN: INTRAVENOUS | Status: AC

## 2019-09-27 MED ORDER — LIP MEDEX EX OINT
TOPICAL_OINTMENT | CUTANEOUS | Status: AC
  Filled 2019-09-27: qty 7

## 2019-09-27 MED ORDER — POTASSIUM CHLORIDE 10 MEQ/100ML IV SOLN
10.0000 meq | INTRAVENOUS | Status: AC
  Administered 2019-09-27 (×4): 10 meq via INTRAVENOUS
  Filled 2019-09-27 (×3): qty 100

## 2019-09-27 NOTE — Progress Notes (Signed)
PROGRESS NOTE    [name]    Code Status: Full Code  ZOX:[id number] DOB: 1931-05-10 DOA: 09/19/2019 LOS: 7 days  PCP: Carol Ada, MD CC:  Chief Complaint  Patient presents with  . Diarrhea  . Emesis       Hospital Summary   Patient is 86 year old female with history of hypertension, hyperlipidemia, CAD, CVA, dementia presented with diarrhea, vomiting, was admitted for sepsis from presumed colitis Patient had reported vomiting and diarrhea for 2 days, unable to eat or drink anything on the day of admission, possible complaining of rectal soreness per granddaughter.  At baseline, normally able to walk with assistance, eat and usually fairly alert and awake.  Over last 24 hours prior to admission, she had been less responsive.  Per EMS, was initially hypotensive 90/50, improved with IV fluid bolus. In ED, lipase within normal limits, creatinine 2.35, alk phos 145, WBCs 23.8 lactate 2.1.  UA negative Chest x-ray hazy opacity in medial right lung base, early consolidation or atelectasis.  CT abdomen pelvis showed marked circumferential thickening and adjacent inflammation centered upon the rectum and distal sigmoid no associated pneumatosis, free air.  Differentials including infectious or inflammatory colitis  Blood cultures positive for staph hominis and patient was started on vancomycin.  ID was consulted and plan was to continue vancomycin until discharge at which time she would be changed to doxycycline for total 10 days treatment.  Currently in talks with palliative care and family.  A & P   Principal Problem:   Sepsis ([place]) Active Problems:   CAD (coronary artery disease), autologous vein bypass graft   Essential hypertension   Hyperlipidemia LDL goal <70   Memory loss   Dementia without behavioral disturbance ([name])   AKI (acute kidney injury) ([place])   Colitis   Pressure injury of skin   Goals of care, counseling/discussion   Palliative care by specialist  Dysphagia   1. Sepsis, multifactorial: Acute infectious colitis, staph hominis bacteremia a. Sepsis has resolved b. Continue vancomycin until patient is ready for discharge and transition to doxycycline for total 10 days, currently Day 5/10  2. Dementia with delirium/acute metabolic encephalopathy/failure to thrive a. At baseline she is alert and can walk with assistance however that does not seem to be at baseline b. CT had unremarkable for acute pathology on 5/3 c. Likely from poor p.o. intake and sepsis/bacteremia d. Ammonia level down after barely tolerating lactulose enema e. Continue to treat bacteremia as above f. Per palliative: Home with home health care, home based physical therapy, home SLP evaluation and management is requested by grand daughter [name]. She is accepting of home based palliative care, in addition to home health, for additional support and to track how the patient is doing after discharge.   3. Hyponatremia from poor p.o. intake a. Dry on exam b. LR  4. Hypomagnesemia a. Replete  5. Hypokalemia a. Resolved  6. Hypophosphatemia a. Replete IV  7. Aspiration/dysphagia a. Continue with SLP and palliative b. Would not recommend G-tube at this point as this increases her risk of aspiration and puts her through the surgery, continue with IV fluids and encouraging p.o. intake  8. Hypotension, now hypertensive a. Continue beta blocker  9. CAD a. Continue beta-blocker and Plavix  DVT prophylaxis: SCDs Family Communication: Patient's granddaughter updated by palliative today Disposition Plan:  Status is: Inpatient  Remains inpatient appropriate because:Unsafe d/c plan and IV treatments appropriate due to intensity of illness or inability to take PO  Dispo: The patient is from: Home              Anticipated d/c is to: TBD              Anticipated d/c date is: > 3 days              Patient currently is not medically stable to  d/c.           Pressure injury documentation   Pressure Injury 09/20/19 Sacrum Mid Stage 2 -  Partial thickness loss of dermis presenting as a shallow open injury with a red, pink wound bed without slough. (Active)  09/20/19 2300  Location: Sacrum  Location Orientation: Mid  Staging: Stage 2 -  Partial thickness loss of dermis presenting as a shallow open injury with a red, pink wound bed without slough.  Wound Description (Comments):   Present on Admission: Yes     Consultants  Palliative  ID   Procedures  None  Antibiotics   Anti-infectives (From admission, onward)   Start     Dose/Rate Route Frequency Ordered Stop   09/23/19 1200  vancomycin (VANCOREADY) IVPB 750 mg/150 mL     750 mg 150 mL/hr over 60 Minutes Intravenous Every 24 hours 09/22/19 1106     09/22/19 1130  vancomycin (VANCOCIN) IVPB 1000 mg/200 mL premix     1,000 mg 200 mL/hr over 60 Minutes Intravenous STAT 09/22/19 1105 09/22/19 1334   09/22/19 1000  ceFEPIme (MAXIPIME) 2 g in sodium chloride 0.9 % 100 mL IVPB  Status:  Discontinued     2 g 200 mL/hr over 30 Minutes Intravenous Every 12 hours 09/22/19 0838 09/22/19 1447   09/21/19 1200  vancomycin (VANCOREADY) IVPB 500 mg/100 mL  Status:  Discontinued     500 mg 100 mL/hr over 60 Minutes Intravenous Every 36 hours 09/20/19 0449 09/20/19 0941   09/20/19 2200  ceFEPIme (MAXIPIME) 2 g in sodium chloride 0.9 % 100 mL IVPB  Status:  Discontinued     2 g 200 mL/hr over 30 Minutes Intravenous Every 24 hours 09/20/19 0449 09/22/19 0838   09/20/19 0800  metroNIDAZOLE (FLAGYL) IVPB 500 mg  Status:  Discontinued     500 mg 100 mL/hr over 60 Minutes Intravenous Every 8 hours 09/20/19 0202 09/22/19 1447   09/19/19 2245  ceFEPIme (MAXIPIME) 2 g in sodium chloride 0.9 % 100 mL IVPB     2 g 200 mL/hr over 30 Minutes Intravenous  Once 09/19/19 2236 09/19/19 2357   09/19/19 2245  metroNIDAZOLE (FLAGYL) IVPB 500 mg     500 mg 100 mL/hr over 60 Minutes  Intravenous  Once 09/19/19 2236 09/20/19 0102   09/19/19 2245  vancomycin (VANCOCIN) IVPB 1000 mg/200 mL premix     1,000 mg 200 mL/hr over 60 Minutes Intravenous  Once 09/19/19 2236 09/20/19 0206        Subjective   Patient seemed slightly more alert today but still a poor historian and unable to provide much history, especially with difficulty with speech.   Objective   Vitals:   09/26/19 1338 09/26/19 2141 09/27/19 0603 09/27/19 1308  BP: (!) 147/90 (!) 146/59 127/64 (!) 155/75  Pulse: 90 92 (!) 49 83  Resp: '14 18 14 16  ' Temp: 98.3 F (36.8 C) 99 F (37.2 C) (!) 97.5 F (36.4 C) 98.3 F (36.8 C)  TempSrc: Oral Oral Oral Oral  SpO2: (!) 88% 95% 96% 97%  Weight:      Height:  Intake/Output Summary (Last 24 hours) at 09/27/2019 1628 Last data filed at 09/27/2019 1026 Gross per 24 hour  Intake 50 ml  Output --  Net 50 ml   Filed Weights   09/20/19 0300  Weight: 55 kg    Examination:  Physical Exam Vitals and nursing note reviewed.  Constitutional:      General: She is not in acute distress.    Appearance: She is not toxic-appearing.  HENT:     Head: Normocephalic.     Mouth/Throat:     Mouth: Mucous membranes are dry.  Cardiovascular:     Rate and Rhythm: Normal rate and regular rhythm.  Pulmonary:     Effort: Pulmonary effort is normal. No respiratory distress.  Abdominal:     General: There is no distension.     Tenderness: There is no abdominal tenderness.  Musculoskeletal:        General: No swelling or tenderness.  Skin:    Coloration: Skin is not jaundiced or pale.  Neurological:     Comments: Seems at new baseline     Data Reviewed: I have personally reviewed following labs and imaging studies  CBC: Recent Labs  Lab 09/22/19 0457 09/23/19 0555 09/24/19 0505 09/26/19 0541 09/27/19 0549  WBC 12.2* 9.3 8.4 9.5 10.0  HGB 11.0* 10.5* 10.9* 10.9* 9.6*  HCT 33.5* 32.6* 33.0* 34.6* 30.2*  MCV 94.6 94.5 93.8 98.9 95.0  PLT 323 290  292 334 824   Basic Metabolic Panel: Recent Labs  Lab 09/23/19 0555 09/23/19 0555 09/24/19 0505 09/24/19 0717 09/24/19 1702 09/25/19 0551 09/26/19 0541 09/27/19 0549  NA 138  --  136  --   --  134* 134* 134*  K 3.3*   < > 2.8*  --  3.6 3.7 3.7 3.1*  CL 108  --  107  --   --  102 105 105  CO2 22  --  21*  --   --  23 20* 20*  GLUCOSE 116*  --  112*  --   --  113* 98 100*  BUN 16  --  14  --   --  '9 11 14  ' CREATININE 0.85  --  0.66  --   --  0.55 0.61 0.71  CALCIUM 7.9*  --  7.8*  --   --  7.6* 7.7* 8.0*  MG  --   --   --  1.3*  --  1.9 1.6* 1.8  PHOS  --   --   --   --   --   --  2.2* 3.1   < > = values in this interval not displayed.   GFR: Estimated Creatinine Clearance: 43.6 mL/min (by C-G formula based on SCr of 0.71 mg/dL). Liver Function Tests: Recent Labs  Lab 09/21/19 0517 09/22/19 0457 09/23/19 0555  AST 46* 36 29  ALT '22 20 17  ' ALKPHOS 110 96 93  BILITOT 0.6 0.6 0.7  PROT 5.3* 5.0* 4.9*  ALBUMIN 2.5* 2.4* 2.3*   No results for input(s): LIPASE, AMYLASE in the last 168 hours. Recent Labs  Lab 09/25/19 1852 09/27/19 0620  AMMONIA 54* 33   Coagulation Profile: No results for input(s): INR, PROTIME in the last 168 hours. Cardiac Enzymes: No results for input(s): CKTOTAL, CKMB, CKMBINDEX, TROPONINI in the last 168 hours. BNP (last 3 results) No results for input(s): PROBNP in the last 8760 hours. HbA1C: No results for input(s): HGBA1C in the last 72 hours. CBG: Recent Labs  Lab 09/22/19 [id number]  09/26/19 1202 09/26/19 1728 09/27/19 1208  GLUCAP 117* 94 93 94   Lipid Profile: No results for input(s): CHOL, HDL, LDLCALC, TRIG, CHOLHDL, LDLDIRECT in the last 72 hours. Thyroid Function Tests: No results for input(s): TSH, T4TOTAL, FREET4, T3FREE, THYROIDAB in the last 72 hours. Anemia Panel: No results for input(s): VITAMINB12, FOLATE, FERRITIN, TIBC, IRON, RETICCTPCT in the last 72 hours. Sepsis Labs: No results for input(s): PROCALCITON,  LATICACIDVEN in the last 168 hours.  Recent Results (from the past 240 hour(s))  Urine culture     Status: None   Collection Time: 09/19/19 10:23 PM   Specimen: Urine, Clean Catch  Result Value Ref Range Status   Specimen Description   Final    URINE, CLEAN CATCH Performed at [facility], [place] [address] [place], [place] [address]    Special Requests   Final    NONE Performed at [facility], [place] [address] [place], [place] [address]    Culture   Final    NO GROWTH Performed at [facility], [place] [address] [place], [place] [address]    Report Status 09/21/2019 FINAL  Final  Blood Culture (routine x 2)     Status: Abnormal   Collection Time: 09/19/19 11:18 PM   Specimen: BLOOD  Result Value Ref Range Status   Specimen Description   Final    BLOOD Performed at [place] [address], [place], [place] [address]    Special Requests   Final    NONE Performed at [facility], [place] [address] [place], [place] [address]    Culture  Setup Time   Final    GRAM POSITIVE COCCI IN CLUSTERS AEROBIC BOTTLE ONLY CRITICAL RESULT CALLED TO, READ BACK BY AND VERIFIED WITH: Audrea Muscat [id number] 09/21/2019 Mena Goes Performed at [facility], [place] [address] [place], [place] [address]    Culture STAPHYLOCOCCUS HOMINIS (A)  Final   Report Status 09/22/2019 FINAL  Final  Blood Culture (routine x 2)     Status: Abnormal   Collection Time: 09/19/19 11:19 PM   Specimen: BLOOD  Result Value Ref Range Status   Specimen Description   Final    BLOOD Performed at [place] [address], [place],  [address]    Special Requests   Final    NONE Performed at [facility], [place] [address] [place],  [address]    Culture  Setup Time   Final    GRAM POSITIVE COCCI IN CLUSTERS IN BOTH AEROBIC AND ANAEROBIC BOTTLES CRITICAL RESULT CALLED TO, READ  BACK BY AND VERIFIED WITH: Audrea Muscat 0113 09/21/2019 Mena Goes Performed at [facility], [place] [address], [place],  [address]    Culture STAPHYLOCOCCUS HOMINIS (A)  Final   Report Status 09/22/2019 FINAL  Final   Organism ID, Bacteria STAPHYLOCOCCUS HOMINIS  Final      Susceptibility   Staphylococcus hominis - MIC*    CIPROFLOXACIN <=0.5 SENSITIVE Sensitive     ERYTHROMYCIN >=8 RESISTANT Resistant     GENTAMICIN <=0.5 SENSITIVE Sensitive     OXACILLIN RESISTANT Resistant     TETRACYCLINE <=1 SENSITIVE Sensitive     VANCOMYCIN <=0.5 SENSITIVE Sensitive     TRIMETH/SULFA <=10 SENSITIVE Sensitive     CLINDAMYCIN <=0.25 SENSITIVE Sensitive     RIFAMPIN <=0.5 SENSITIVE Sensitive     Inducible Clindamycin NEGATIVE Sensitive     * STAPHYLOCOCCUS HOMINIS  SARS Coronavirus 2 by RT PCR (hospital order, performed in [facility] hospital lab) Nasopharyngeal Nasopharyngeal Swab     Status: None   Collection Time: 09/19/19 11:19 PM   Specimen: Nasopharyngeal Swab  Result Value Ref Range Status   SARS Coronavirus 2 NEGATIVE NEGATIVE Final    Comment: Performed at [facility], [place] [address] [place], [place] [address]  Blood Culture ID Panel (Reflexed)     Status: Abnormal   Collection Time: 09/19/19 11:19 PM  Result Value Ref Range Status   Enterococcus species NOT DETECTED NOT DETECTED Final   Listeria monocytogenes NOT DETECTED NOT DETECTED Final   Staphylococcus species DETECTED (A) NOT DETECTED Final    Comment: Methicillin (oxacillin) susceptible coagulase negative staphylococcus. Possible blood culture contaminant (unless isolated from more than one blood culture draw or clinical case suggests pathogenicity). No antibiotic treatment is indicated for blood  culture contaminants. CRITICAL RESULT CALLED TO, READ BACK BY AND VERIFIED WITH: J. GRIMSLEY,PHARMD [id number] 09/21/2019 T. TYSOR    Staphylococcus aureus (BCID) NOT DETECTED NOT DETECTED Final    Methicillin resistance NOT DETECTED NOT DETECTED Final   Streptococcus species NOT DETECTED NOT DETECTED Final   Streptococcus agalactiae NOT DETECTED NOT DETECTED Final   Streptococcus pneumoniae NOT DETECTED NOT DETECTED Final   Streptococcus pyogenes NOT DETECTED NOT DETECTED Final   Acinetobacter baumannii NOT DETECTED NOT DETECTED Final   Enterobacteriaceae species NOT DETECTED NOT DETECTED Final   Enterobacter cloacae complex NOT DETECTED NOT DETECTED Final   Escherichia coli NOT DETECTED NOT DETECTED Final   Klebsiella oxytoca NOT DETECTED NOT DETECTED Final   Klebsiella pneumoniae NOT DETECTED NOT DETECTED Final   Proteus species NOT DETECTED NOT DETECTED Final   Serratia marcescens NOT DETECTED NOT DETECTED Final   Haemophilus influenzae NOT DETECTED NOT DETECTED Final   Neisseria meningitidis NOT DETECTED NOT DETECTED Final   Pseudomonas aeruginosa NOT DETECTED NOT DETECTED Final   Candida albicans NOT DETECTED NOT DETECTED Final   Candida glabrata NOT DETECTED NOT DETECTED Final   Candida krusei NOT DETECTED NOT DETECTED Final   Candida parapsilosis NOT DETECTED NOT DETECTED Final   Candida tropicalis NOT DETECTED NOT DETECTED Final    Comment: Performed at [facility], [address] [place], [place] [address]  Culture, blood (Routine X 2) w Reflex to ID Panel     Status: None   Collection Time: 09/22/19  2:02 PM   Specimen: BLOOD LEFT HAND  Result Value Ref Range Status   Specimen Description   Final    BLOOD LEFT HAND Performed at [place] [address] [place], [place] [address]    Special Requests   Final    BOTTLES DRAWN AEROBIC ONLY Blood Culture adequate volume Performed at [place] [address] [place], [place] [address]    Culture   Final    NO GROWTH 5 DAYS Performed at [facility], [place] [address], [place], [place] [address]    Report Status 09/27/2019 FINAL  Final  Culture, blood (Routine  X 2) w Reflex to ID Panel     Status: None   Collection Time: 09/22/19  2:21 PM   Specimen: BLOOD LEFT HAND  Result Value Ref Range Status   Specimen Description   Final    BLOOD LEFT HAND Performed at [place] [address] [place], [place] [address]    Special Requests   Final    BOTTLES DRAWN AEROBIC  ONLY Blood Culture results may not be optimal due to an inadequate volume of blood received in culture bottles Performed at [facility], [place] [address] [place], [place] [address]    Culture   Final    NO GROWTH 5 DAYS Performed at [facility], [place] [address], [place], [place] [address]    Report Status 09/27/2019 FINAL  Final         Radiology Studies: No results found.      Scheduled Meds: . atenolol  25 mg Oral Daily  . brimonidine  1 drop Left Eye BID  . chlorhexidine  15 mL Mouth Rinse BID  . clopidogrel  75 mg Oral Daily  . dorzolamide-timolol  1 drop Left Eye BID  . feeding supplement  1 Container Oral TID BM  . heparin  5,000 Units Subcutaneous Q8H  . latanoprost  1 drop Both Eyes QHS  . mouth rinse  15 mL Mouth Rinse q12n4p   Continuous Infusions: . lactated ringers 100 mL/hr at 09/27/19 1006  . vancomycin 750 mg (09/27/19 1155)     Time spent: 25 minutes with over 50% of the time coordinating the patient's care    Harold Hedge, DO [facility] Pager [phone]  Call night coverage person covering after 7pm

## 2019-09-27 NOTE — Progress Notes (Signed)
Daily Progress Note   Patient Name: [name]       Date: 09/27/2019 DOB: 02-19-1932  Age: 86 y.o. MRN#: [id number] Attending Physician: Harold Hedge, MD Primary Care Physician: Carol Ada, MD Admit Date: 09/19/2019  Reason for Consultation/Follow-up: Establishing goals of care and Psychosocial/spiritual support  Subjective: [name] is awake, resting in bed. She stacks me in the room upon entering. She answers a few yes/no type questions appropriately, how ever she then began to [facility] as well.   Call placed and discussed with grand daughter [name], see below.    Length of Stay: 7  Current Medications: Scheduled Meds:  . atenolol  25 mg Oral Daily  . brimonidine  1 drop Left Eye BID  . chlorhexidine  15 mL Mouth Rinse BID  . clopidogrel  75 mg Oral Daily  . dorzolamide-timolol  1 drop Left Eye BID  . feeding supplement  1 Container Oral TID BM  . heparin  5,000 Units Subcutaneous Q8H  . latanoprost  1 drop Both Eyes QHS  . mouth rinse  15 mL Mouth Rinse q12n4p    Continuous Infusions: . lactated ringers 100 mL/hr at 09/27/19 1006  . potassium chloride 10 mEq (09/27/19 1257)  . vancomycin 750 mg (09/27/19 1155)    PRN Meds: hydrALAZINE, Resource ThickenUp Clear  Physical Exam Constitutional:      General: She is not in acute distress.    Comments: Awake, resting in bed. Mumbles a few words. Is able to meaningfully interact some. Answers in mono syllables.   Pulmonary:     Effort: Pulmonary effort is normal. No respiratory distress.  Skin:    General: Skin is warm and dry.  Psychiatric:        Cognition and Memory: Cognition is impaired.             Vital Signs: BP (!) 155/75 (BP Location: Left Arm)   Pulse 83   Temp 98.3 F (36.8 C) (Oral)   Resp 16    Ht 5\' 4"  (1.626 m)   Wt 55 kg Comment: last noted   SpO2 97%   BMI 20.81 kg/m  SpO2: SpO2: 97 % O2 Device: O2 Device: Room Air O2 Flow Rate:    Intake/output summary:   Intake/Output Summary (Last 24 hours) at 09/27/2019 1356 Last data filed at 09/27/2019 1026 Gross per 24 hour  Intake 50 ml  Output --  Net 50 ml   LBM: Last BM Date: 09/25/19 Baseline Weight: Weight: 55 kg(last noted ) Most recent weight: Weight: 55 kg(last noted )       Palliative Assessment/Data: PPS 10-20% due to PO intake    Flowsheet Rows     Most Recent Value  Intake Tab  Referral Department  Hospitalist  Unit at Time of Referral  Med/Surg Unit  Palliative Care Primary Diagnosis  Neurology  Date Notified  09/23/19  Palliative Care Type  New Palliative care  Reason for referral  Clarify Goals of Care  Date of Admission  09/19/19  Date first seen by Palliative Care  09/23/19  # of days Palliative referral response time  0 Day(s)  # of days IP prior to Palliative referral  4  Clinical Assessment  Palliative Performance Scale Score  10%  Psychosocial & Spiritual Assessment  Palliative Care Outcomes  Patient/Family meeting held?  Yes  Who was at the meeting?  granddaughter  Palliative Care Outcomes  Clarified goals of care      Patient Active Problem List   Diagnosis Date Noted  . Dysphagia   . Goals of care, counseling/discussion   . Palliative care by specialist   . Pressure injury of skin 09/21/2019  . Sepsis ([place]) 09/20/2019  . AKI (acute kidney injury) ([place]) 09/20/2019  . Colitis 09/20/2019  . Frequent PVCs 08/24/2019  . Dementia without behavioral disturbance ([place]) 08/24/2019  . History of recent fall 08/24/2019  . Hip pain 07/15/2019  . Memory loss 07/09/2018  . Vitamin B12 deficiency 07/09/2018  . Hypertensive urgency 10/10/2016  . Hypokalemia 10/10/2016  . Migraine equivalent   . Aneurysm, cerebral, nonruptured   . TIA (transient ischemic attack) 04/05/2016  . Cerebellar  infarct ([place]) 02/16/2016  . Stroke (cerebrum) ([place]) 01/21/2016  . Neurological deficit present 01/21/2016  . Greater trochanteric bursitis of right hip 06/15/2015  . Hyperlipidemia LDL goal <70 05/14/2013  . Statin intolerance   . Essential hypertension   . CAD (coronary artery disease), autologous vein bypass graft 06/30/2006  . 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 female  with past medical history of stroke, osteoarthritis, non-STEMI, hypertension, dementia, CAD, basal cell carcinoma, aneurysm, hyperlipidemia admitted on 09/19/2019 with sepsis secondary to colitis.  Assessment: Follow up call with [name] today - Introduced myself and palliative care as follows: Palliative medicine is specialized medical care for people living with serious illness. It focuses on providing relief from the symptoms and stress of a serious illness. The goal is to improve quality of life for both the patient and the family.  Goals of care: Broad aims of medical therapy in relation to the patient's values and preferences. Our aim is to provide medical care aimed at enabling patients to achieve the goals that matter most to them, given the circumstances of their particular medical situation and their constraints.   Goals of care and treatment preferences re discussed. Talked about current hospitalization. Patient's grand daughter states that the patient is more alert, more interactive, verbalizing more and that she was able to carefully had feed the patient with some liquids today, at the time of her visit.   [name] tells me that she is hopeful that the patient will have some degree of stabilization/recovery. Prior to this current hospitalization, the patient had seen her neurologist who had started home based PT.   At this time, [name] requests for home based SLP evaluation, after discharge. She is hopeful that the patient's PO intake will improve once her  infection is better and once she is back homoe in her familiar surroundings. She wishes to resume the home based PT as well.   Additionally, we talked about home based palliative care for extra support, to which [name] is agreeable.   Plan: Full code, full scope care, no PEG for now.  Continue efforts at PO, OOB with PT and SLP here.  Home health care, home based PT, SLP at home has been requested by grand daughter [name].  Home based palliative to also follow.     Code Status:  Full code  Prognosis:   Unable to determine - months  Discharge Planning:  Home with home health care, home based physical therapy, home  SLP evaluation and management is requested by grand daughter [name]. She is accepting of home based palliative care, in addition to home health, for additional support and to track how the patient is doing after discharge.   Care plan was discussed with   Dr Neysa Bonito, granddaughter  Thank you for allowing the Palliative Medicine Team to assist in the care of this patient.   Total Time 25 mins Prolonged Time Billed  no       Greater than 50%  of this time was spent counseling and coordinating care related to the above assessment and plan.    Loistine Chance MD Palliative Medicine Team Team Phone # [phone]

## 2019-09-28 LAB — GLUCOSE, CAPILLARY
Glucose-Capillary: 126 mg/dL — ABNORMAL HIGH (ref 70–99)
Glucose-Capillary: 97 mg/dL (ref 70–99)
Glucose-Capillary: 101 mg/dL — ABNORMAL HIGH (ref 70–99)

## 2019-09-28 LAB — CBC
HCT: 31 % — ABNORMAL LOW (ref 36.0–46.0)
Hemoglobin: 10.1 g/dL — ABNORMAL LOW (ref 12.0–15.0)
MCH: 30.5 pg (ref 26.0–34.0)
MCHC: 32.6 g/dL (ref 30.0–36.0)
MCV: 93.7 fL (ref 80.0–100.0)
Platelets: 467 10*3/uL — ABNORMAL HIGH (ref 150–400)
RBC: 3.31 MIL/uL — ABNORMAL LOW (ref 3.87–5.11)
RDW: 13.1 % (ref 11.5–15.5)
WBC: 9.9 10*3/uL (ref 4.0–10.5)
nRBC: 0 % (ref 0.0–0.2)

## 2019-09-28 LAB — BASIC METABOLIC PANEL
Anion gap: 9 (ref 5–15)
BUN: 17 mg/dL (ref 8–23)
CO2: 22 mmol/L (ref 22–32)
Calcium: 8 mg/dL — ABNORMAL LOW (ref 8.9–10.3)
Chloride: 108 mmol/L (ref 98–111)
Creatinine, Ser: 0.65 mg/dL (ref 0.44–1.00)
GFR calc Af Amer: >60 mL/min (ref >60)
GFR calc non Af Amer: >60 mL/min (ref >60)
Glucose, Bld: 134 mg/dL — ABNORMAL HIGH (ref 70–99)
Potassium: 3.5 mmol/L (ref 3.5–5.1)
Sodium: 139 mmol/L (ref 135–145)

## 2019-09-28 NOTE — Progress Notes (Signed)
Pharmacy Antibiotic Note  [name] is a 86 y.o. female admitted on 09/19/2019 with diarrhea, emesis.  Pharmacy has been consulted for vancomycin and cefepime dosing. Patient found to have bacteremia - Cefepime/Flagyl d/c'd 5/24 and Vanc continued  Today, 09/28/19  - Day 7 Vanc of planned 10 days therapy per ID recs - WBC 9.9, stable/WNL - SCr 0.65 WNL - Afebrile - 5/21 BCx: staph hominis, 5/24 repeat BCx: no growth Final  Plan:  Continue vancomycin 750 mg IV q24h  Plan per ID, if repeat BCx continue to be negative, plan change to doxy PO at discharge for total of 10 days of vanc/doxy combo  Follow renal function, culture data. Check vancomycin levels as needed: goal VT 15-20 mcg/mL  If able to take PO could change to doxy 100 PO bid to complete planned 10 day course; on D#7/10 today  Height: 5\' 4"  (162.6 cm) Weight: 55 kg (121 lb 4.1 oz)(last noted ) IBW/kg (Calculated) : 54.7  Temp (24hrs), Avg:98.2 F (36.8 C), Min:97.6 F (36.4 C), Max:98.6 F (37 C)  Recent Labs  Lab 09/23/19 0555 09/23/19 0555 09/24/19 0505 09/25/19 0551 09/26/19 0541 09/27/19 0549 09/28/19 0539  WBC 9.3  --  8.4  --  9.5 10.0 9.9  CREATININE 0.85   < > 0.66 0.55 0.61 0.71 0.65   < > = values in this interval not displayed.    Estimated Creatinine Clearance: 43.6 mL/min (by C-G formula based on SCr of 0.65 mg/dL).    Allergies  Allergen Reactions  . Nsaids Other (See Comments)    URINARY RETENTION  . Statins Other (See Comments)    MYALGIAS HURTS ALL OVER  . Tricor [Fenofibrate] Other (See Comments)    MYALGIAS HURTS ALL OVER  . Zetia [Ezetimibe] Other (See Comments)    MYALGIAS HURTS ALL OVER  . Lescol [Fluvastatin]     Muscle pain  . Livalo [Pitavastatin]     Muscle pain  . Niaspan [Niacin]     Severe flushing   . Relafen [Nabumetone] Rash   Antimicrobials this admission:  Vancomycin 09/19/2019 x1 then 5/24 >> Cefepime 09/19/2019 >> 5/24 Flagyl 5/22 >> 5/24 Dose  adjustments this admission:  5/24 cefepime 2 g IV q24h --> 2 g IV q12h Microbiology results:  5/21 BCx: 3/4 staph hominis (R erythro, oxacillin5) 5/24 repeat BCx2: NGF 5/21 UCx: ngF  Thank you for allowing pharmacy to be a part of this patient's care.  Eudelia Bunch, Pharm.D 09/28/2019 8:54 AM

## 2019-09-28 NOTE — Progress Notes (Addendum)
PROGRESS NOTE    [name]    Code Status: Full Code  OQH:[id number] DOB: 01/11/1932 DOA: 09/19/2019 LOS: 8 days  PCP: Erica Ada, MD CC:  Chief Complaint  Patient presents with  . Diarrhea  . Emesis       Hospital Summary   Patient is 86 year old female with history of hypertension, hyperlipidemia, CAD, CVA, vascular dementia presented with diarrhea, vomiting, was admitted for sepsis from presumed colitis Patient had reported vomiting and diarrhea for 2 days, unable to eat or drink anything on the day of admission, possible complaining of rectal soreness per granddaughter.  At baseline, normally able to walk with assistance, eat and usually fairly alert and awake.  Over last 24 hours prior to admission, she had been less responsive.  Per EMS, was initially hypotensive 90/50, improved with IV fluid bolus. In ED, lipase within normal limits, creatinine 2.35, alk phos 145, WBCs 23.8 lactate 2.1.  UA negative Chest x-ray hazy opacity in medial right lung base, early consolidation or atelectasis.  CT abdomen pelvis showed marked circumferential thickening and adjacent inflammation centered upon the rectum and distal sigmoid no associated pneumatosis, free air.  Differentials including infectious or inflammatory colitis  Blood cultures positive for staph hominis and patient was started on vancomycin.  ID was consulted and plan was to continue vancomycin until discharge at which time she would be changed to doxycycline for total 10 days treatment.  Currently in talks with palliative care and family.  A & P   Principal Problem:   Sepsis ([name]) Active Problems:   CAD (coronary artery disease), autologous vein bypass graft   Essential hypertension   Hyperlipidemia LDL goal <70   Memory loss   Dementia without behavioral disturbance ([name])   AKI (acute kidney injury) ([name])   Colitis   Pressure injury of skin   Goals of care, counseling/discussion   Palliative care by specialist  Dysphagia   1. Sepsis, multifactorial: Acute infectious colitis, staph hominis bacteremia a. Sepsis has resolved b. Continue vancomycin until patient is ready for discharge and transition to doxycycline for total 10 days, currently Day 7/10 c. Likely to remain hospitalized until completion of IV antibiotics  2. Acute metabolic encephalopathy, multifactorial: elevated ammonia, sepsis, bacteremia, hospital acquired delirium, electrolyte abnormalities, vascular dementia  Failure to thrive a. At baseline she is alert and can walk with assistance, currently awake alert and oriented x0 b. CT had unremarkable for acute pathology on 5/3 c. Ammonia level down after barely tolerating lactulose enema d. Continue to treat bacteremia as above e. Per palliative: Home with home health care, home based physical therapy, home SLP evaluation and management is requested by grand daughter [name]. She is accepting of home based palliative care, in addition to home health, for additional support and to track how the patient is doing after discharge.   3. Hyponatremia from poor p.o. intake a. Resolved with IV fluids  4. Hypomagnesemia resolved  5. Hypokalemia Resolved  6. Hypophosphatemia resolved   7. Aspiration/dysphagia a. Continue with SLP and palliative b. Would not recommend G-tube at this point as this increases her risk of aspiration and puts her through the surgery, continue with IV fluids and encouraging p.o. intake as needed c. this will be a major limiting factor for her discharge as she is unable to have good PO intake at this time  8. Hypotension a. Continue beta blocker  9. CAD a. Continue beta-blocker and Plavix  DVT prophylaxis: SCDs Family Communication: Granddaughter at bedside Disposition  Plan:  Status is: Inpatient  Remains inpatient appropriate because:Unsafe d/c plan and IV treatments appropriate due to intensity of illness or inability to take PO   Dispo: The patient  is from: Home              Anticipated d/c is to: TBD              Anticipated d/c date is: > 3 days              Patient currently is not medically stable to d/c.           Pressure injury documentation   Pressure Injury 09/20/19 Sacrum Mid Stage 2 -  Partial thickness loss of dermis presenting as a shallow open injury with a red, pink wound bed without slough. (Active)  09/20/19 2300  Location: Sacrum  Location Orientation: Mid  Staging: Stage 2 -  Partial thickness loss of dermis presenting as a shallow open injury with a red, pink wound bed without slough.  Wound Description (Comments):   Present on Admission: Yes     Consultants  Palliative  ID   Procedures  None  Antibiotics   Anti-infectives (From admission, onward)   Start     Dose/Rate Route Frequency Ordered Stop   09/23/19 1200  vancomycin (VANCOREADY) IVPB 750 mg/150 mL     750 mg 150 mL/hr over 60 Minutes Intravenous Every 24 hours 09/22/19 1106     09/22/19 1130  vancomycin (VANCOCIN) IVPB 1000 mg/200 mL premix     1,000 mg 200 mL/hr over 60 Minutes Intravenous STAT 09/22/19 1105 09/22/19 1334   09/22/19 1000  ceFEPIme (MAXIPIME) 2 g in sodium chloride 0.9 % 100 mL IVPB  Status:  Discontinued     2 g 200 mL/hr over 30 Minutes Intravenous Every 12 hours 09/22/19 0838 09/22/19 1447   09/21/19 1200  vancomycin (VANCOREADY) IVPB 500 mg/100 mL  Status:  Discontinued     500 mg 100 mL/hr over 60 Minutes Intravenous Every 36 hours 09/20/19 0449 09/20/19 0941   09/20/19 2200  ceFEPIme (MAXIPIME) 2 g in sodium chloride 0.9 % 100 mL IVPB  Status:  Discontinued     2 g 200 mL/hr over 30 Minutes Intravenous Every 24 hours 09/20/19 0449 09/22/19 0838   09/20/19 0800  metroNIDAZOLE (FLAGYL) IVPB 500 mg  Status:  Discontinued     500 mg 100 mL/hr over 60 Minutes Intravenous Every 8 hours 09/20/19 0202 09/22/19 1447   09/19/19 2245  ceFEPIme (MAXIPIME) 2 g in sodium chloride 0.9 % 100 mL IVPB     2 g 200 mL/hr  over 30 Minutes Intravenous  Once 09/19/19 2236 09/19/19 2357   09/19/19 2245  metroNIDAZOLE (FLAGYL) IVPB 500 mg     500 mg 100 mL/hr over 60 Minutes Intravenous  Once 09/19/19 2236 09/20/19 0102   09/19/19 2245  vancomycin (VANCOCIN) IVPB 1000 mg/200 mL premix     1,000 mg 200 mL/hr over 60 Minutes Intravenous  Once 09/19/19 2236 09/20/19 0206        Subjective   Patient resting comfortably at bedside with granddaughter present.  History obtained from granddaughter.  States that the patient occasionally coughs when trying to drink Ensure and asking questions regarding aspiration.  I advised her that if she is coughing she is likely aspirating while she eats.  Granddaughter states that patient is typically slightly confused at home with more confused currently.  No events overnight   Objective   Vitals:  09/27/19 1308 09/27/19 2112 09/28/19 0613 09/28/19 1324  BP: (!) 155/75 126/67 138/61 (!) 166/65  Pulse: 83 92 86 85  Resp: '16 18 18 15  ' Temp: 98.3 F (36.8 C) 98.6 F (37 C) 97.6 F (36.4 C) 98.3 F (36.8 C)  TempSrc: Oral Oral Oral Oral  SpO2: 97% 97% 95% 95%  Weight:      Height:        Intake/Output Summary (Last 24 hours) at 09/28/2019 1559 Last data filed at 09/28/2019 4917 Gross per 24 hour  Intake --  Output 200 ml  Net -200 ml   Filed Weights   09/20/19 0300  Weight: 55 kg    Examination:  Physical Exam Vitals and nursing note reviewed.  Constitutional:      General: She is not in acute distress. HENT:     Head: Normocephalic.     Mouth/Throat:     Mouth: Mucous membranes are moist.  Eyes:     Conjunctiva/sclera: Conjunctivae normal.  Cardiovascular:     Rate and Rhythm: Normal rate and regular rhythm.  Pulmonary:     Effort: Pulmonary effort is normal.     Breath sounds: Normal breath sounds.  Abdominal:     General: There is no distension.  Neurological:     Mental Status: She is alert. She is disoriented.  Psychiatric:        Mood and  Affect: Mood normal.     Data Reviewed: I have personally reviewed following labs and imaging studies  CBC: Recent Labs  Lab 09/23/19 0555 09/24/19 0505 09/26/19 0541 09/27/19 0549 09/28/19 0539  WBC 9.3 8.4 9.5 10.0 9.9  HGB 10.5* 10.9* 10.9* 9.6* 10.1*  HCT 32.6* 33.0* 34.6* 30.2* 31.0*  MCV 94.5 93.8 98.9 95.0 93.7  PLT 290 292 334 376 915*   Basic Metabolic Panel: Recent Labs  Lab 09/24/19 0505 09/24/19 0505 09/24/19 0717 09/24/19 1702 09/25/19 0551 09/26/19 0541 09/27/19 0549 09/28/19 0539  NA 136  --   --   --  134* 134* 134* 139  K 2.8*   < >  --  3.6 3.7 3.7 3.1* 3.5  CL 107  --   --   --  102 105 105 108  CO2 21*  --   --   --  23 20* 20* 22  GLUCOSE 112*  --   --   --  113* 98 100* 134*  BUN 14  --   --   --  '9 11 14 17  ' CREATININE 0.66  --   --   --  0.55 0.61 0.71 0.65  CALCIUM 7.8*  --   --   --  7.6* 7.7* 8.0* 8.0*  MG  --   --  1.3*  --  1.9 1.6* 1.8  --   PHOS  --   --   --   --   --  2.2* 3.1  --    < > = values in this interval not displayed.   GFR: Estimated Creatinine Clearance: 43.6 mL/min (by C-G formula based on SCr of 0.65 mg/dL). Liver Function Tests: Recent Labs  Lab 09/22/19 0457 09/23/19 0555  AST 36 29  ALT 20 17  ALKPHOS 96 93  BILITOT 0.6 0.7  PROT 5.0* 4.9*  ALBUMIN 2.4* 2.3*   No results for input(s): LIPASE, AMYLASE in the last 168 hours. Recent Labs  Lab 09/25/19 1852 09/27/19 0620  AMMONIA 54* 33   Coagulation Profile: No results for input(s): INR, PROTIME  in the last 168 hours. Cardiac Enzymes: No results for input(s): CKTOTAL, CKMB, CKMBINDEX, TROPONINI in the last 168 hours. BNP (last 3 results) No results for input(s): PROBNP in the last 8760 hours. HbA1C: No results for input(s): HGBA1C in the last 72 hours. CBG: Recent Labs  Lab 09/27/19 1208 09/27/19 1736 09/27/19 2349 09/28/19 0539 09/28/19 1229  GLUCAP 94 107* 143* 126* 97   Lipid Profile: No results for input(s): CHOL, HDL, LDLCALC, TRIG,  CHOLHDL, LDLDIRECT in the last 72 hours. Thyroid Function Tests: No results for input(s): TSH, T4TOTAL, FREET4, T3FREE, THYROIDAB in the last 72 hours. Anemia Panel: No results for input(s): VITAMINB12, FOLATE, FERRITIN, TIBC, IRON, RETICCTPCT in the last 72 hours. Sepsis Labs: No results for input(s): PROCALCITON, LATICACIDVEN in the last 168 hours.  Recent Results (from the past 240 hour(s))  Urine culture     Status: None   Collection Time: 09/19/19 10:23 PM   Specimen: Urine, Clean Catch  Result Value Ref Range Status   Specimen Description   Final    URINE, CLEAN CATCH Performed at [facility], [place] [address] [place], [place] [address]    Special Requests   Final    NONE Performed at [facility], [place] [address] [place], [place] [address]    Culture   Final    NO GROWTH Performed at [facility], [place] [address] [place], [place] [address]    Report Status 09/21/2019 FINAL  Final  Blood Culture (routine x 2)     Status: Abnormal   Collection Time: 09/19/19 11:18 PM   Specimen: BLOOD  Result Value Ref Range Status   Specimen Description   Final    BLOOD Performed at [place] [address] [place], [place] [address]    Special Requests   Final    NONE Performed at [facility], [place] [address] [place], [place] [address]    Culture  Setup Time   Final    GRAM POSITIVE COCCI IN CLUSTERS AEROBIC BOTTLE ONLY CRITICAL RESULT CALLED TO, READ BACK BY AND VERIFIED WITH: Audrea Muscat [id number] 09/21/2019 Mena Goes Performed at [facility], [place] [address], [place], [place] [address]    Culture STAPHYLOCOCCUS HOMINIS (A)  Final   Report Status 09/22/2019 FINAL  Final  Blood Culture (routine x 2)     Status: Abnormal   Collection Time: 09/19/19 11:19 PM   Specimen: BLOOD  Result Value Ref Range Status   Specimen Description   Final    BLOOD Performed at [place] [address], [place], [place] [address]    Special Requests   Final    NONE Performed at [facility], [place] [address], [place], [place] [address]    Culture  Setup Time   Final    GRAM POSITIVE COCCI IN CLUSTERS IN BOTH AEROBIC AND ANAEROBIC BOTTLES CRITICAL RESULT CALLED TO, READ BACK BY AND VERIFIED WITH: Audrea Muscat 0113 09/21/2019 Mena Goes Performed at [facility], [place] [address], [place], [place] [address]    Culture STAPHYLOCOCCUS HOMINIS (A)  Final   Report Status 09/22/2019 FINAL  Final   Organism ID, Bacteria STAPHYLOCOCCUS HOMINIS  Final      Susceptibility   Staphylococcus hominis - MIC*    CIPROFLOXACIN <=0.5 SENSITIVE Sensitive     ERYTHROMYCIN >=8 RESISTANT Resistant     GENTAMICIN <=0.5 SENSITIVE Sensitive     OXACILLIN RESISTANT Resistant  TETRACYCLINE <=1 SENSITIVE Sensitive     VANCOMYCIN <=0.5 SENSITIVE Sensitive     TRIMETH/SULFA <=10 SENSITIVE Sensitive     CLINDAMYCIN <=0.25 SENSITIVE Sensitive     RIFAMPIN <=0.5 SENSITIVE Sensitive     Inducible Clindamycin NEGATIVE Sensitive     * STAPHYLOCOCCUS HOMINIS  SARS Coronavirus 2 by RT PCR (hospital order, performed in [facility] hospital lab) Nasopharyngeal Nasopharyngeal Swab     Status: None   Collection Time: 09/19/19 11:19 PM   Specimen: Nasopharyngeal Swab  Result Value Ref Range Status   SARS Coronavirus 2 NEGATIVE NEGATIVE Final    Comment: Performed at [facility], [place] [address], [place], [place] [address]  Blood Culture ID Panel (Reflexed)     Status: Abnormal   Collection Time: 09/19/19 11:19 PM  Result Value Ref Range Status   Enterococcus species NOT DETECTED NOT DETECTED Final   Listeria monocytogenes NOT DETECTED NOT DETECTED Final   Staphylococcus species DETECTED (A) NOT DETECTED Final    Comment: Methicillin (oxacillin) susceptible coagulase negative staphylococcus. Possible blood culture contaminant (unless isolated from  more than one blood culture draw or clinical case suggests pathogenicity). No antibiotic treatment is indicated for blood  culture contaminants. CRITICAL RESULT CALLED TO, READ BACK BY AND VERIFIED WITH: J. GRIMSLEY,PHARMD 0113 09/21/2019 T. TYSOR    Staphylococcus aureus (BCID) NOT DETECTED NOT DETECTED Final   Methicillin resistance NOT DETECTED NOT DETECTED Final   Streptococcus species NOT DETECTED NOT DETECTED Final   Streptococcus agalactiae NOT DETECTED NOT DETECTED Final   Streptococcus pneumoniae NOT DETECTED NOT DETECTED Final   Streptococcus pyogenes NOT DETECTED NOT DETECTED Final   Acinetobacter baumannii NOT DETECTED NOT DETECTED Final   Enterobacteriaceae species NOT DETECTED NOT DETECTED Final   Enterobacter cloacae complex NOT DETECTED NOT DETECTED Final   Escherichia coli NOT DETECTED NOT DETECTED Final   Klebsiella oxytoca NOT DETECTED NOT DETECTED Final   Klebsiella pneumoniae NOT DETECTED NOT DETECTED Final   Proteus species NOT DETECTED NOT DETECTED Final   Serratia marcescens NOT DETECTED NOT DETECTED Final   Haemophilus influenzae NOT DETECTED NOT DETECTED Final   Neisseria meningitidis NOT DETECTED NOT DETECTED Final   Pseudomonas aeruginosa NOT DETECTED NOT DETECTED Final   Candida albicans NOT DETECTED NOT DETECTED Final   Candida glabrata NOT DETECTED NOT DETECTED Final   Candida krusei NOT DETECTED NOT DETECTED Final   Candida parapsilosis NOT DETECTED NOT DETECTED Final   Candida tropicalis NOT DETECTED NOT DETECTED Final    Comment: Performed at [facility], [address], [place], [place] [address]  Culture, blood (Routine X 2) w Reflex to ID Panel     Status: None   Collection Time: 09/22/19  2:02 PM   Specimen: BLOOD LEFT HAND  Result Value Ref Range Status   Specimen Description   Final    BLOOD LEFT HAND Performed at [place] [address] [place], [place] [address]    Special Requests   Final    BOTTLES DRAWN  AEROBIC ONLY Blood Culture adequate volume Performed at [place] [address] [place], [place] [address]    Culture   Final    NO GROWTH 5 DAYS Performed at [facility], [place] [address], [place], [place] [address]    Report Status 09/27/2019 FINAL  Final  Culture, blood (Routine X 2) w Reflex to ID Panel     Status: None   Collection Time: 09/22/19  2:21 PM  Specimen: BLOOD LEFT HAND  Result Value Ref Range Status   Specimen Description   Final    BLOOD LEFT HAND Performed at [place] [address] [place], [place] [address]    Special Requests   Final    BOTTLES DRAWN AEROBIC ONLY Blood Culture results may not be optimal due to an inadequate volume of blood received in culture bottles Performed at [place] [address], [place], [place] [address]    Culture   Final    NO GROWTH 5 DAYS Performed at [facility], [place] [address] [place], [place] [address]    Report Status 09/27/2019 FINAL  Final         Radiology Studies: No results found.      Scheduled Meds: . atenolol  25 mg Oral Daily  . brimonidine  1 drop Left Eye BID  . chlorhexidine  15 mL Mouth Rinse BID  . clopidogrel  75 mg Oral Daily  . dorzolamide-timolol  1 drop Left Eye BID  . feeding supplement  1 Container Oral TID BM  . heparin  5,000 Units Subcutaneous Q8H  . latanoprost  1 drop Both Eyes QHS  . mouth rinse  15 mL Mouth Rinse q12n4p   Continuous Infusions: . vancomycin 750 mg (09/28/19 1231)     Time spent: 30 minutes with over 50% of the time coordinating the patient's care    Harold Hedge, DO [facility] Pager [phone]  Call night coverage person covering after 7pm

## 2019-09-29 LAB — GLUCOSE, CAPILLARY
Glucose-Capillary: 100 mg/dL — ABNORMAL HIGH (ref 70–99)
Glucose-Capillary: 96 mg/dL (ref 70–99)
Glucose-Capillary: 104 mg/dL — ABNORMAL HIGH (ref 70–99)
Glucose-Capillary: 110 mg/dL — ABNORMAL HIGH (ref 70–99)

## 2019-09-29 NOTE — Progress Notes (Signed)
  Speech Language Pathology Treatment: Dysphagia  Patient Details Name: [name] MRN: [id number] DOB: 1931/06/02 Today's Date: 09/29/2019 Time: 1110-1150 SLP Time Calculation (min) (ACUTE ONLY): 40 min  Assessment / Plan / Recommendation Clinical Impression  Separate session to educate granddaughter to pt's dysphagia, mitigation strategies including diet modifications, compensations.  Reviewed expectation of fluidity of swallowing due to pt's cognitive deficit and need to modify based on pt's performance and desire.  Educated that water is safest to consume due to pH neutral status and gustatory sensation of sweet is present at final stages.  In addition, pt's performance today is much improved thus advise to advance diet to dys3/thin.    Granddaughter desired information re: dietary recommendation.  Provided written handout re: current diet including food items to avoid - including dense items.  Importance of self feeding completed with demonstrating hand over hand assist and observation of swallow initiation via laryngeal elevation/palpation needed.  Advised pt's caregivers use a flashlight to assure pt clears her mouth after her meals.  Nectar thick liquids may be helpful during meals due to increased aspiration risk with solid food consumption but allowance of thin between meals recommended.   Requested if pt is coughing with thins - to stop and encourage pt to cough.  Sippy cup may be beneficial to help pt control bolus flow and physically feeding herself. Also provided granddaugher with information re: heimlich manuver.  Implored that pt's swallowing with wax/wane and diet modification/compensations, etc will need to modify based on pt. Not feeding pt unless she desires intake is advised for pt QOL and to decr aspiration risk.  Granddaughter reported understanding to information provided.    HPI HPI: 86yo female admitted 09/19/19 with diarrhea, n/v, sepsis from presumed colitis. PMH: HTN,  HLD, CAD/CABG, CVA, advanced dementia, failure to thrive, poor oral intake. CXR = hazy opacity RML base - consolidation vs atelectasis  Pt has been lethargic and not accepting intake and SLP has spoken to granddaughter re: dementia/dysphagia. etc  Follow up for dysphagia management, education indicated.      SLP Plan  Continue with current plan of care       Recommendations  Diet recommendations: Dysphagia 3 (mechanical soft);Nectar-thick liquid(thin ok between meals) Liquids provided via: Straw;Cup Medication Administration: Whole meds with puree Supervision: Staff to assist with self feeding Compensations: Slow rate;Small sips/bites;Other (Comment)(assure pt swallows before giving more) Postural Changes and/or Swallow Maneuvers: Seated upright 90 degrees;Upright 30-60 min after meal                Oral Care Recommendations: Oral care BID Follow up Recommendations: 24 hour supervision/assistance SLP Visit Diagnosis: Dysphagia, unspecified (R13.10) Plan: Continue with current plan of care       GO                Macario Golds 09/29/2019, 12:34 PM  Kathleen Lime, MS [place] Office [phone]

## 2019-09-29 NOTE — Progress Notes (Signed)
PROGRESS NOTE    [name]    Code Status: Full Code  DTH:[id number] DOB: May 15, 1931 DOA: 09/19/2019 LOS: 9 days  PCP: Carol Ada, MD CC:  Chief Complaint  Patient presents with  . Diarrhea  . Emesis       Hospital Summary   Patient is 86 year old female with history of hypertension, hyperlipidemia, CAD, CVA, vascular dementia presented with diarrhea, vomiting, was admitted for sepsis from presumed colitis Patient had reported vomiting and diarrhea for 2 days, unable to eat or drink anything on the day of admission, possible complaining of rectal soreness per granddaughter.  At baseline, normally able to walk with assistance, eat and usually fairly alert and awake.  Over last 24 hours prior to admission, she had been less responsive.  Per EMS, was initially hypotensive 90/50, improved with IV fluid bolus. In ED, lipase within normal limits, creatinine 2.35, alk phos 145, WBCs 23.8 lactate 2.1.  UA negative Chest x-ray hazy opacity in medial right lung base, early consolidation or atelectasis.  CT abdomen pelvis showed marked circumferential thickening and adjacent inflammation centered upon the rectum and distal sigmoid no associated pneumatosis, free air.  Differentials including infectious or inflammatory colitis  Blood cultures positive for staph hominis and patient was started on vancomycin.  ID was consulted and plan was to continue vancomycin until discharge at which time she would be changed to doxycycline for total 10 days treatment.  Currently in talks with palliative care, SLP and family.  A & P   Principal Problem:   Sepsis ([place]) Active Problems:   CAD (coronary artery disease), autologous vein bypass graft   Essential hypertension   Hyperlipidemia LDL goal <70   Memory loss   Dementia without behavioral disturbance ([name])   AKI (acute kidney injury) ([place])   Colitis   Pressure injury of skin   Goals of care, counseling/discussion   Palliative care by  specialist   Dysphagia   1. Sepsis, multifactorial: Acute infectious colitis, staph hominis bacteremia a. Sepsis has resolved b. Continue vancomycin until patient is ready for discharge and transition to doxycycline for total 10 days, currently Day 8/10 c. Likely to remain hospitalized until completion of IV antibiotics due to poor PO intake  2. Acute metabolic encephalopathy, multifactorial: elevated ammonia, sepsis, bacteremia, hospital acquired delirium, electrolyte abnormalities, vascular dementia  Failure to thrive a. At baseline she is alert and can walk with assistance, currently awake alert and oriented x0 b. CT had unremarkable for acute pathology on 5/3 c. Ammonia level down after barely tolerating lactulose enema d. Continue to treat bacteremia as above e. Per palliative: Home with home health care, home based physical therapy, home SLP evaluation and management is requested by grand daughter [name]. She is accepting of home based palliative care, in addition to home health, for additional support and to track how the patient is doing after discharge.   3. Hyponatremia from poor p.o. intake a. Resolved with IV fluids  4. Hypomagnesemia resolved  5. Hypokalemia Resolved  6. Hypophosphatemia resolved   7. Aspiration/dysphagia a. Continue with SLP and palliative b. Would not recommend G-tube at this point as this increases her risk of aspiration and puts her through the surgery, continue with IV fluids and encouraging p.o. intake as needed c. this will be a major limiting factor for her discharge as she is unable to have good PO intake at this time d. Today SLP recommended advancing diet as she did well on her assessment today. Would like to  see how she does with this prior to changing to PO meds  8. Hypotension a. Continue beta blocker  9. CAD a. Continue beta-blocker and Plavix  DVT prophylaxis: SCDs Family Communication: Granddaughter at bedside  yesterday Disposition Plan:  Status is: Inpatient  Remains inpatient appropriate because:Unsafe d/c plan and IV treatments appropriate due to intensity of illness or inability to take PO   Dispo: The patient is from: Home              Anticipated d/c is to: TBD              Anticipated d/c date is: 2 days              Patient currently is not medically stable to d/c.           Pressure injury documentation   Pressure Injury 09/20/19 Sacrum Mid Stage 2 -  Partial thickness loss of dermis presenting as a shallow open injury with a red, pink wound bed without slough. (Active)  09/20/19 2300  Location: Sacrum  Location Orientation: Mid  Staging: Stage 2 -  Partial thickness loss of dermis presenting as a shallow open injury with a red, pink wound bed without slough.  Wound Description (Comments):   Present on Admission: Yes     Consultants  Palliative  ID   Procedures  None  Antibiotics   Anti-infectives (From admission, onward)   Start     Dose/Rate Route Frequency Ordered Stop   09/23/19 1200  vancomycin (VANCOREADY) IVPB 750 mg/150 mL     750 mg 150 mL/hr over 60 Minutes Intravenous Every 24 hours 09/22/19 1106 10/01/19 2359   09/22/19 1130  vancomycin (VANCOCIN) IVPB 1000 mg/200 mL premix     1,000 mg 200 mL/hr over 60 Minutes Intravenous STAT 09/22/19 1105 09/22/19 1334   09/22/19 1000  ceFEPIme (MAXIPIME) 2 g in sodium chloride 0.9 % 100 mL IVPB  Status:  Discontinued     2 g 200 mL/hr over 30 Minutes Intravenous Every 12 hours 09/22/19 0838 09/22/19 1447   09/21/19 1200  vancomycin (VANCOREADY) IVPB 500 mg/100 mL  Status:  Discontinued     500 mg 100 mL/hr over 60 Minutes Intravenous Every 36 hours 09/20/19 0449 09/20/19 0941   09/20/19 2200  ceFEPIme (MAXIPIME) 2 g in sodium chloride 0.9 % 100 mL IVPB  Status:  Discontinued     2 g 200 mL/hr over 30 Minutes Intravenous Every 24 hours 09/20/19 0449 09/22/19 0838   09/20/19 0800  metroNIDAZOLE (FLAGYL) IVPB  500 mg  Status:  Discontinued     500 mg 100 mL/hr over 60 Minutes Intravenous Every 8 hours 09/20/19 0202 09/22/19 1447   09/19/19 2245  ceFEPIme (MAXIPIME) 2 g in sodium chloride 0.9 % 100 mL IVPB     2 g 200 mL/hr over 30 Minutes Intravenous  Once 09/19/19 2236 09/19/19 2357   09/19/19 2245  metroNIDAZOLE (FLAGYL) IVPB 500 mg     500 mg 100 mL/hr over 60 Minutes Intravenous  Once 09/19/19 2236 09/20/19 0102   09/19/19 2245  vancomycin (VANCOCIN) IVPB 1000 mg/200 mL premix     1,000 mg 200 mL/hr over 60 Minutes Intravenous  Once 09/19/19 2236 09/20/19 0206        Subjective   Patient resting comfortably, nurse at bedside, no overnight events. Patient is a poor historian and unable to provide much history due to dementia.    Objective   Vitals:   09/28/19 1324 09/28/19 2041  09/29/19 0618 09/29/19 1439  BP: (!) 166/65 (!) 148/79 (!) 143/81 122/85  Pulse: 85 92 80 (!) 52  Resp: '15 18 18 18  ' Temp: 98.3 F (36.8 C) 98.7 F (37.1 C) (!) 97.5 F (36.4 C) 98.7 F (37.1 C)  TempSrc: Oral Oral Oral Oral  SpO2: 95% 97% 96% 93%  Weight:      Height:        Intake/Output Summary (Last 24 hours) at 09/29/2019 1601 Last data filed at 09/29/2019 0900 Gross per 24 hour  Intake 790 ml  Output 700 ml  Net 90 ml   Filed Weights   09/20/19 0300  Weight: 55 kg    Examination:  Physical Exam Vitals and nursing note reviewed.  Constitutional:      Appearance: Normal appearance.  HENT:     Head: Normocephalic and atraumatic.  Eyes:     Conjunctiva/sclera: Conjunctivae normal.  Cardiovascular:     Rate and Rhythm: Normal rate and regular rhythm.  Pulmonary:     Effort: Pulmonary effort is normal.     Breath sounds: Normal breath sounds.  Abdominal:     General: Abdomen is flat.     Palpations: Abdomen is soft.  Musculoskeletal:        General: No swelling or tenderness.  Skin:    Coloration: Skin is not jaundiced or pale.  Neurological:     Mental Status: She is  alert. Mental status is at baseline.  Psychiatric:        Mood and Affect: Mood normal.        Behavior: Behavior normal.     Data Reviewed: I have personally reviewed following labs and imaging studies  CBC: Recent Labs  Lab 09/23/19 0555 09/24/19 0505 09/26/19 0541 09/27/19 0549 09/28/19 0539  WBC 9.3 8.4 9.5 10.0 9.9  HGB 10.5* 10.9* 10.9* 9.6* 10.1*  HCT 32.6* 33.0* 34.6* 30.2* 31.0*  MCV 94.5 93.8 98.9 95.0 93.7  PLT 290 292 334 376 185*   Basic Metabolic Panel: Recent Labs  Lab 09/24/19 0505 09/24/19 0505 09/24/19 0717 09/24/19 1702 09/25/19 0551 09/26/19 0541 09/27/19 0549 09/28/19 0539  NA 136  --   --   --  134* 134* 134* 139  K 2.8*   < >  --  3.6 3.7 3.7 3.1* 3.5  CL 107  --   --   --  102 105 105 108  CO2 21*  --   --   --  23 20* 20* 22  GLUCOSE 112*  --   --   --  113* 98 100* 134*  BUN 14  --   --   --  '9 11 14 17  ' CREATININE 0.66  --   --   --  0.55 0.61 0.71 0.65  CALCIUM 7.8*  --   --   --  7.6* 7.7* 8.0* 8.0*  MG  --   --  1.3*  --  1.9 1.6* 1.8  --   PHOS  --   --   --   --   --  2.2* 3.1  --    < > = values in this interval not displayed.   GFR: Estimated Creatinine Clearance: 43.6 mL/min (by C-G formula based on SCr of 0.65 mg/dL). Liver Function Tests: Recent Labs  Lab 09/23/19 0555  AST 29  ALT 17  ALKPHOS 93  BILITOT 0.7  PROT 4.9*  ALBUMIN 2.3*   No results for input(s): LIPASE, AMYLASE in the last 168 hours.  Recent Labs  Lab 09/25/19 1852 09/27/19 0620  AMMONIA 54* 33   Coagulation Profile: No results for input(s): INR, PROTIME in the last 168 hours. Cardiac Enzymes: No results for input(s): CKTOTAL, CKMB, CKMBINDEX, TROPONINI in the last 168 hours. BNP (last 3 results) No results for input(s): PROBNP in the last 8760 hours. HbA1C: No results for input(s): HGBA1C in the last 72 hours. CBG: Recent Labs  Lab 09/28/19 1229 09/28/19 1740 09/29/19 0005 09/29/19 0615 09/29/19 1149  GLUCAP 97 101* 100* 96 104*    Lipid Profile: No results for input(s): CHOL, HDL, LDLCALC, TRIG, CHOLHDL, LDLDIRECT in the last 72 hours. Thyroid Function Tests: No results for input(s): TSH, T4TOTAL, FREET4, T3FREE, THYROIDAB in the last 72 hours. Anemia Panel: No results for input(s): VITAMINB12, FOLATE, FERRITIN, TIBC, IRON, RETICCTPCT in the last 72 hours. Sepsis Labs: No results for input(s): PROCALCITON, LATICACIDVEN in the last 168 hours.  Recent Results (from the past 240 hour(s))  Urine culture     Status: None   Collection Time: 09/19/19 10:23 PM   Specimen: Urine, Clean Catch  Result Value Ref Range Status   Specimen Description   Final    URINE, CLEAN CATCH Performed at [facility], [place] [address] [place], [place] [address]    Special Requests   Final    NONE Performed at [facility], [place] [address] [place], [place] [address]    Culture   Final    NO GROWTH Performed at [facility], [place] [address], [place], [place] [address]    Report Status 09/21/2019 FINAL  Final  Blood Culture (routine x 2)     Status: Abnormal   Collection Time: 09/19/19 11:18 PM   Specimen: BLOOD  Result Value Ref Range Status   Specimen Description   Final    BLOOD Performed at [place] [address], [place], [place] [address]    Special Requests   Final    NONE Performed at [facility], [place] [address] [place], [place] [address]    Culture  Setup Time   Final    GRAM POSITIVE COCCI IN CLUSTERS AEROBIC BOTTLE ONLY CRITICAL RESULT CALLED TO, READ BACK BY AND VERIFIED WITH: Audrea Muscat [id number] 09/21/2019 Mena Goes Performed at [facility], [place] [address], [place], [place] [address]    Culture STAPHYLOCOCCUS HOMINIS (A)  Final   Report Status 09/22/2019 FINAL  Final  Blood Culture (routine x 2)     Status: Abnormal   Collection Time: 09/19/19 11:19 PM   Specimen: BLOOD  Result Value Ref Range Status    Specimen Description   Final    BLOOD Performed at [place] [address], [place], [place] [address]    Special Requests   Final    NONE Performed at [facility], [place] [address], [place], [place] [address]    Culture  Setup Time   Final    GRAM POSITIVE COCCI IN CLUSTERS IN BOTH AEROBIC AND ANAEROBIC BOTTLES CRITICAL RESULT CALLED TO, READ BACK BY AND VERIFIED WITH: Audrea Muscat 0113 09/21/2019 Mena Goes Performed at [facility], [place] [address] [place], [place] [address]    Culture STAPHYLOCOCCUS HOMINIS (A)  Final   Report Status 09/22/2019 FINAL  Final   Organism ID, Bacteria STAPHYLOCOCCUS HOMINIS  Final      Susceptibility   Staphylococcus hominis - MIC*    CIPROFLOXACIN <=0.5 SENSITIVE Sensitive  ERYTHROMYCIN >=8 RESISTANT Resistant     GENTAMICIN <=0.5 SENSITIVE Sensitive     OXACILLIN RESISTANT Resistant     TETRACYCLINE <=1 SENSITIVE Sensitive     VANCOMYCIN <=0.5 SENSITIVE Sensitive     TRIMETH/SULFA <=10 SENSITIVE Sensitive     CLINDAMYCIN <=0.25 SENSITIVE Sensitive     RIFAMPIN <=0.5 SENSITIVE Sensitive     Inducible Clindamycin NEGATIVE Sensitive     * STAPHYLOCOCCUS HOMINIS  SARS Coronavirus 2 by RT PCR (hospital order, performed in [place] hospital lab) Nasopharyngeal Nasopharyngeal Swab     Status: None   Collection Time: 09/19/19 11:19 PM   Specimen: Nasopharyngeal Swab  Result Value Ref Range Status   SARS Coronavirus 2 NEGATIVE NEGATIVE Final    Comment: Performed at [facility], [place] [address], [place], [place] [address]  Blood Culture ID Panel (Reflexed)     Status: Abnormal   Collection Time: 09/19/19 11:19 PM  Result Value Ref Range Status   Enterococcus species NOT DETECTED NOT DETECTED Final   Listeria monocytogenes NOT DETECTED NOT DETECTED Final   Staphylococcus species DETECTED (A) NOT DETECTED Final    Comment: Methicillin (oxacillin) susceptible coagulase  negative staphylococcus. Possible blood culture contaminant (unless isolated from more than one blood culture draw or clinical case suggests pathogenicity). No antibiotic treatment is indicated for blood  culture contaminants. CRITICAL RESULT CALLED TO, READ BACK BY AND VERIFIED WITH: J. GRIMSLEY,PHARMD 0113 09/21/2019 T. TYSOR    Staphylococcus aureus (BCID) NOT DETECTED NOT DETECTED Final   Methicillin resistance NOT DETECTED NOT DETECTED Final   Streptococcus species NOT DETECTED NOT DETECTED Final   Streptococcus agalactiae NOT DETECTED NOT DETECTED Final   Streptococcus pneumoniae NOT DETECTED NOT DETECTED Final   Streptococcus pyogenes NOT DETECTED NOT DETECTED Final   Acinetobacter baumannii NOT DETECTED NOT DETECTED Final   Enterobacteriaceae species NOT DETECTED NOT DETECTED Final   Enterobacter cloacae complex NOT DETECTED NOT DETECTED Final   Escherichia coli NOT DETECTED NOT DETECTED Final   Klebsiella oxytoca NOT DETECTED NOT DETECTED Final   Klebsiella pneumoniae NOT DETECTED NOT DETECTED Final   Proteus species NOT DETECTED NOT DETECTED Final   Serratia marcescens NOT DETECTED NOT DETECTED Final   Haemophilus influenzae NOT DETECTED NOT DETECTED Final   Neisseria meningitidis NOT DETECTED NOT DETECTED Final   Pseudomonas aeruginosa NOT DETECTED NOT DETECTED Final   Candida albicans NOT DETECTED NOT DETECTED Final   Candida glabrata NOT DETECTED NOT DETECTED Final   Candida krusei NOT DETECTED NOT DETECTED Final   Candida parapsilosis NOT DETECTED NOT DETECTED Final   Candida tropicalis NOT DETECTED NOT DETECTED Final    Comment: Performed at [facility], [address] [place], [place] [address]  Culture, blood (Routine X 2) w Reflex to ID Panel     Status: None   Collection Time: 09/22/19  2:02 PM   Specimen: BLOOD LEFT HAND  Result Value Ref Range Status   Specimen Description   Final    BLOOD LEFT HAND Performed at [place]  [address], [place], [place] [address]    Special Requests   Final    BOTTLES DRAWN AEROBIC ONLY Blood Culture adequate volume Performed at [place] [address], [place], [place] [address]    Culture   Final    NO GROWTH 5 DAYS Performed at [facility], [place] [address], [place], [place] [address]    Report Status 09/27/2019 FINAL  Final  Culture,  blood (Routine X 2) w Reflex to ID Panel     Status: None   Collection Time: 09/22/19  2:21 PM   Specimen: BLOOD LEFT HAND  Result Value Ref Range Status   Specimen Description   Final    BLOOD LEFT HAND Performed at [place] [address], [place], [place] [address]    Special Requests   Final    BOTTLES DRAWN AEROBIC ONLY Blood Culture results may not be optimal due to an inadequate volume of blood received in culture bottles Performed at [place] [address] [place], [place] [address]    Culture   Final    NO GROWTH 5 DAYS Performed at [facility], [place] [address], [place], [place] [address]    Report Status 09/27/2019 FINAL  Final         Radiology Studies: No results found.      Scheduled Meds: . atenolol  25 mg Oral Daily  . brimonidine  1 drop Left Eye BID  . chlorhexidine  15 mL Mouth Rinse BID  . clopidogrel  75 mg Oral Daily  . dorzolamide-timolol  1 drop Left Eye BID  . feeding supplement  1 Container Oral TID BM  . heparin  5,000 Units Subcutaneous Q8H  . latanoprost  1 drop Both Eyes QHS  . mouth rinse  15 mL Mouth Rinse q12n4p   Continuous Infusions: . vancomycin 750 mg (09/29/19 1201)     Time spent: 25 minutes with over 50% of the time coordinating the patient's care    Harold Hedge, DO [facility] Pager [phone]  Call night coverage person covering after 7pm

## 2019-09-29 NOTE — Progress Notes (Signed)
  Speech Language Pathology Treatment:    Patient Details Name: [name] MRN: [id number] DOB: 1931/11/26 Today's Date: 09/29/2019 Time: 1040-1110 SLP Time Calculation (min) (ACUTE ONLY): 30 min  Assessment / Plan / Recommendation Clinical Impression  Pt today with much improved strength and voice - able to help feed herself liquids by assisting to hold cup and able to hold a sandwich and graham cracker independently.  NO indication of aspiration with all po observed including 1/4 sandwich, 3 graham crackers, 2 ounces nectar thickened water, 1 ounce thin soda, 1 ounce thin coffee.  Pt benefited from hand over hand assist to facilitate swallowing as it provides improved proprioception. Voice was gurgly *slightly* x2 which cleared with cued cough - although pt did not perform dry swallow on command.  She continues to appear with delay in swallow - SLP unable to decipher if oral or pharyngeal but clinically appears oral.  No excessive mulitple swallows with solids today had been concerning for stasis during last session.  Pt has improved adequately to recommend diet advancement to dys3/nectar and allow thin between meals.  New signs posted to indicate care plan and SLP reached out to MD and informed RN.    HPI HPI: 86yo female admitted 09/19/19 with diarrhea, n/v, sepsis from presumed colitis. PMH: HTN, HLD, CAD/CABG, CVA, advanced dementia, failure to thrive, poor oral intake. CXR = hazy opacity RML base - consolidation vs atelectasis  Pt has been lethargic and not accepting intake and SLP has spoken to granddaughter re: dementia/dysphagia. etc  Follow up for dysphagia management, education indicated.      SLP Plan  Continue with current plan of care       Recommendations  Diet recommendations: Dysphagia 3 (mechanical soft);Nectar-thick liquid;Other(comment)(thin between meals) Liquids provided via: Straw;Cup Medication Administration: Whole meds with puree Supervision: Staff to assist  with self feeding Compensations: Slow rate;Small sips/bites;Other (Comment)(assure pt swallows before giving more) Postural Changes and/or Swallow Maneuvers: Seated upright 90 degrees;Upright 30-60 min after meal                Oral Care Recommendations: Oral care BID Follow up Recommendations: 24 hour supervision/assistance SLP Visit Diagnosis: Dysphagia, unspecified (R13.10) Plan: Continue with current plan of care       GO               Kathleen Lime, MS [place]  Macario Golds 09/29/2019, 12:19 PM

## 2019-09-30 LAB — BASIC METABOLIC PANEL WITH GFR
Anion gap: 8 (ref 5–15)
BUN: 15 mg/dL (ref 8–23)
CO2: 25 mmol/L (ref 22–32)
Calcium: 8.2 mg/dL — ABNORMAL LOW (ref 8.9–10.3)
Chloride: 107 mmol/L (ref 98–111)
Creatinine, Ser: 0.59 mg/dL (ref 0.44–1.00)
GFR calc Af Amer: >60 mL/min
GFR calc non Af Amer: >60 mL/min
Glucose, Bld: 113 mg/dL — ABNORMAL HIGH (ref 70–99)
Potassium: 3.9 mmol/L (ref 3.5–5.1)
Sodium: 140 mmol/L (ref 135–145)

## 2019-09-30 LAB — GLUCOSE, CAPILLARY
Glucose-Capillary: 106 mg/dL — ABNORMAL HIGH (ref 70–99)
Glucose-Capillary: 110 mg/dL — ABNORMAL HIGH (ref 70–99)
Glucose-Capillary: 110 mg/dL — ABNORMAL HIGH (ref 70–99)
Glucose-Capillary: 113 mg/dL — ABNORMAL HIGH (ref 70–99)
Glucose-Capillary: 132 mg/dL — ABNORMAL HIGH (ref 70–99)

## 2019-09-30 MED ORDER — ACETAMINOPHEN 500 MG PO TABS
500.0000 mg | ORAL_TABLET | Freq: Four times a day (QID) | ORAL | Status: DC | PRN
  Administered 2019-09-30: 500 mg via ORAL
  Filled 2019-09-30 (×2): qty 1

## 2019-09-30 NOTE — Care Management Important Message (Signed)
Important Message  Patient Details IM Letter given to Mariel Sleet RN Case Manager to present to the Patient Name: [name] MRN: [id number] Date of Birth: May 22, 1931   Medicare Important Message Given:  Yes     Kerin Salen 09/30/2019, 10:45 AM

## 2019-09-30 NOTE — Progress Notes (Signed)
PROGRESS NOTE    HARLEM THRESHER    Code Status: Full Code  NOM:[id number] DOB: 1931-09-06 DOA: 09/19/2019 LOS: 10 days  PCP: Carol Ada, MD CC:  Chief Complaint  Patient presents with   Diarrhea   Emesis       Hospital Summary   Patient is 86 year old female with history of hypertension, hyperlipidemia, CAD, CVA, vascular dementia presented with diarrhea, vomiting, was admitted for sepsis from presumed colitis Patient had reported vomiting and diarrhea for 2 days, unable to eat or drink anything on the day of admission, possible complaining of rectal soreness per granddaughter.  At baseline, normally able to walk with assistance, eat and usually fairly alert and awake.  Over last 24 hours prior to admission, she had been less responsive.  Per EMS, was initially hypotensive 90/50, improved with IV fluid bolus. In ED, lipase within normal limits, creatinine 2.35, alk phos 145, WBCs 23.8 lactate 2.1.  UA negative Chest x-ray hazy opacity in medial right lung base, early consolidation or atelectasis.  CT abdomen pelvis showed marked circumferential thickening and adjacent inflammation centered upon the rectum and distal sigmoid no associated pneumatosis, free air.  Differentials including infectious or inflammatory colitis  Blood cultures positive for staph hominis and patient was started on vancomycin.  ID was consulted and plan was to continue vancomycin until discharge at which time she would be changed to doxycycline for total 10 days treatment.  Currently in talks with palliative care, SLP and family.  A & P   Principal Problem:   Sepsis ([place]) Active Problems:   CAD (coronary artery disease), autologous vein bypass graft   Essential hypertension   Hyperlipidemia LDL goal <70   Memory loss   Dementia without behavioral disturbance ([name])   AKI (acute kidney injury) ([place])   Colitis   Pressure injury of skin   Goals of care, counseling/discussion   Palliative care by  specialist   Dysphagia   1. Sepsis, multifactorial: Acute infectious colitis, staph hominis bacteremia a. Sepsis has resolved b. Continue vancomycin until patient is ready for discharge and transition to doxycycline for total 10 days, currently Day 9/10 c. Remain hospitalized for completion of IV antibiotics due to poor PO intake  2. Acute metabolic encephalopathy, multifactorial: elevated ammonia, sepsis, bacteremia, hospital acquired delirium, electrolyte abnormalities, vascular dementia   Failure to thrive a. At baseline she is alert and can walk with assistance, currently awake alert and oriented x0 b. CT had unremarkable for acute pathology on 5/3 c. Ammonia level down after barely tolerating lactulose enema d. Continue to treat bacteremia as above e. PT recommending SNF however granddaughter wishes to take the patient home f. Per palliative, plan at discharge: Home with home health care, home based physical therapy, home SLP evaluation and management is requested by grand daughter [name]. She is accepting of home based palliative care, in addition to home health, for additional support and to track how the patient is doing after discharge.  g. Hospital bed will be delivered at home on Sunday so she will likely be discharged over the weekend  3. Hyponatremia from poor p.o. intake a. Resolved with IV fluids  4. Hypomagnesemia resolved  5. Hypokalemia Resolved  6. Hypophosphatemia resolved   7. Aspiration/dysphagia a. Continue with SLP and palliative b. Would not recommend G-tube at this point as this increases her risk of aspiration and puts her through the surgery, continue with IV fluids and encouraging p.o. intake as needed c. this will be a major limiting  factor for her discharge as she is unable to have good PO intake at this time d. 5/31: SLP recommended advancing diet on 5/31 as she did well on her assessment.   8. Hypotension a. Continue beta  blocker  9. CAD a. Continue beta-blocker and Plavix  DVT prophylaxis: SCDs Family Communication: Granddaughter over the phone today Disposition Plan:  Status is: Inpatient  Remains inpatient appropriate because:Unsafe d/c plan, IV treatments appropriate due to intensity of illness or inability to take PO and Hospital bed to be delivered on 'Sunday so might not be discharge on Sunday unless this changes   Dispo: The patient is from: Home              Anticipated d/c is to: TBD              Anticipated d/c date is: 2 days              Patient currently is not medically stable to d/c.           Pressure injury documentation   Pressure Injury 09/20/19 Sacrum Mid Stage 2 -  Partial thickness loss of dermis presenting as a shallow open injury with a red, pink wound bed without slough. (Active)  09/20/19 2300  Location: Sacrum  Location Orientation: Mid  Staging: Stage 2 -  Partial thickness loss of dermis presenting as a shallow open injury with a red, pink wound bed without slough.  Wound Description (Comments):   Present on Admission: Yes     Consultants  Palliative  ID   Procedures  None  Antibiotics   Anti-infectives (From admission, onward)   Start     Dose/Rate Route Frequency Ordered Stop   09/23/19 1200  vancomycin (VANCOREADY) IVPB 750 mg/150 mL     750 mg 150 mL/hr over 60 Minutes Intravenous Every 24 hours 09/22/19 1106 10/01/19 2359   09/22/19 1130  vancomycin (VANCOCIN) IVPB 1000 mg/200 mL premix     1,000 mg 200 mL/hr over 60 Minutes Intravenous STAT 09/22/19 1105 09/22/19 1334   09/22/19 1000  ceFEPIme (MAXIPIME) 2 g in sodium chloride 0.9 % 100 mL IVPB  Status:  Discontinued     2 g 200 mL/hr over 30 Minutes Intravenous Every 12 hours 09/22/19 0838 09/22/19 1447   09/21/19 1200  vancomycin (VANCOREADY) IVPB 500 mg/100 mL  Status:  Discontinued     500 mg 100 mL/hr over 60 Minutes Intravenous Every 36 hours 09/20/19 0449 09/20/19 0941   09/20/19  2200  ceFEPIme (MAXIPIME) 2 g in sodium chloride 0.9 % 100 mL IVPB  Status:  Discontinued     2 g 200 mL/hr over 30 Minutes Intravenous Every 24 hours 09/20/19 0449 09/22/19 0838   09/20/19 0800  metroNIDAZOLE (FLAGYL) IVPB 500 mg  Status:  Discontinued     500 mg 100 mL/hr over 60 Minutes Intravenous Every 8 hours 09/20/19 0202 09/22/19 1447   09/19/19 2245  ceFEPIme (MAXIPIME) 2 g in sodium chloride 0.9 % 100 mL IVPB     2 g 200 mL/hr over 30 Minutes Intravenous  Once 09/19/19 2236 09/19/19 2357   09/19/19 2245  metroNIDAZOLE (FLAGYL) IVPB 500 mg     50' 0 mg 100 mL/hr over 60 Minutes Intravenous  Once 09/19/19 2236 09/20/19 0102   09/19/19 2245  vancomycin (VANCOCIN) IVPB 1000 mg/200 mL premix     1,000 mg 200 mL/hr over 60 Minutes Intravenous  Once 09/19/19 2236 09/20/19 0206        Subjective  Patient is a poor historian given her advanced dementia and seems pleasantly confused at baseline.  Unable to provide much history.  No overnight events.  Objective   Vitals:   09/29/19 2225 09/30/19 0615 09/30/19 1107 09/30/19 1210  BP: 135/77 (!) 155/90 (!) 141/75 (!) 142/74  Pulse: 84 87 98 95  Resp: '19 15  18  ' Temp: 98.6 F (37 C) 98.2 F (36.8 C)  97.6 F (36.4 C)  TempSrc: Oral Oral  Axillary  SpO2: 94% 97%  95%  Weight:      Height:        Intake/Output Summary (Last 24 hours) at 09/30/2019 1450 Last data filed at 09/30/2019 0900 Gross per 24 hour  Intake 410 ml  Output 600 ml  Net -190 ml   Filed Weights   09/20/19 0300  Weight: 55 kg    Examination:  Physical Exam Vitals and nursing note reviewed.  Constitutional:      General: She is not in acute distress. HENT:     Head: Normocephalic.     Mouth/Throat:     Mouth: Mucous membranes are moist.  Eyes:     Conjunctiva/sclera: Conjunctivae normal.  Cardiovascular:     Rate and Rhythm: Normal rate and regular rhythm.  Pulmonary:     Effort: No respiratory distress.     Breath sounds: No wheezing.   Abdominal:     General: Abdomen is flat. There is no distension.  Neurological:     Mental Status: She is alert. Mental status is at baseline.  Psychiatric:        Mood and Affect: Mood normal.     Data Reviewed: I have personally reviewed following labs and imaging studies  CBC: Recent Labs  Lab 09/24/19 0505 09/26/19 0541 09/27/19 0549 09/28/19 0539  WBC 8.4 9.5 10.0 9.9  HGB 10.9* 10.9* 9.6* 10.1*  HCT 33.0* 34.6* 30.2* 31.0*  MCV 93.8 98.9 95.0 93.7  PLT 292 334 376 321*   Basic Metabolic Panel: Recent Labs  Lab 09/24/19 0717 09/24/19 1702 09/25/19 0551 09/26/19 0541 09/27/19 0549 09/28/19 0539 09/30/19 0438  NA  --   --  134* 134* 134* 139 140  K  --    < > 3.7 3.7 3.1* 3.5 3.9  CL  --   --  102 105 105 108 107  CO2  --   --  23 20* 20* 22 25  GLUCOSE  --   --  113* 98 100* 134* 113*  BUN  --   --  '9 11 14 17 15  ' CREATININE  --   --  0.55 0.61 0.71 0.65 0.59  CALCIUM  --   --  7.6* 7.7* 8.0* 8.0* 8.2*  MG 1.3*  --  1.9 1.6* 1.8  --   --   PHOS  --   --   --  2.2* 3.1  --   --    < > = values in this interval not displayed.   GFR: Estimated Creatinine Clearance: 43.6 mL/min (by C-G formula based on SCr of 0.59 mg/dL). Liver Function Tests: No results for input(s): AST, ALT, ALKPHOS, BILITOT, PROT, ALBUMIN in the last 168 hours. No results for input(s): LIPASE, AMYLASE in the last 168 hours. Recent Labs  Lab 09/25/19 1852 09/27/19 0620  AMMONIA 54* 33   Coagulation Profile: No results for input(s): INR, PROTIME in the last 168 hours. Cardiac Enzymes: No results for input(s): CKTOTAL, CKMB, CKMBINDEX, TROPONINI in the last 168 hours. BNP (last  3 results) No results for input(s): PROBNP in the last 8760 hours. HbA1C: No results for input(s): HGBA1C in the last 72 hours. CBG: Recent Labs  Lab 09/29/19 1149 09/29/19 1659 09/30/19 0047 09/30/19 0617 09/30/19 1221  GLUCAP 104* 110* 110* 110* 113*   Lipid Profile: No results for input(s):  CHOL, HDL, LDLCALC, TRIG, CHOLHDL, LDLDIRECT in the last 72 hours. Thyroid Function Tests: No results for input(s): TSH, T4TOTAL, FREET4, T3FREE, THYROIDAB in the last 72 hours. Anemia Panel: No results for input(s): VITAMINB12, FOLATE, FERRITIN, TIBC, IRON, RETICCTPCT in the last 72 hours. Sepsis Labs: No results for input(s): PROCALCITON, LATICACIDVEN in the last 168 hours.  Recent Results (from the past 240 hour(s))  Culture, blood (Routine X 2) w Reflex to ID Panel     Status: None   Collection Time: 09/22/19  2:02 PM   Specimen: BLOOD LEFT HAND  Result Value Ref Range Status   Specimen Description   Final    BLOOD LEFT HAND Performed at [facility] [address] [place], [place] [address]    Special Requests   Final    BOTTLES DRAWN AEROBIC ONLY Blood Culture adequate volume Performed at [facility] [address], [place], [place] [address]    Culture   Final    NO GROWTH 5 DAYS Performed at [facility], [place] [address] [place], [place] [address]    Report Status 09/27/2019 FINAL  Final  Culture, blood (Routine X 2) w Reflex to ID Panel     Status: None   Collection Time: 09/22/19  2:21 PM   Specimen: BLOOD LEFT HAND  Result Value Ref Range Status   Specimen Description   Final    BLOOD LEFT HAND Performed at [facility] [address], [place], [place] [address]    Special Requests   Final    BOTTLES DRAWN AEROBIC ONLY Blood Culture results may not be optimal due to an inadequate volume of blood received in culture bottles Performed at [facility] [address] [place], [place] [address]    Culture   Final    NO GROWTH 5 DAYS Performed at [facility], [place] [address] [place], [place] [address]    Report Status 09/27/2019 FINAL  Final         Radiology Studies: No results found.      Scheduled Meds:  atenolol  25 mg Oral Daily   brimonidine  1 drop  Left Eye BID   chlorhexidine  15 mL Mouth Rinse BID   clopidogrel  75 mg Oral Daily   dorzolamide-timolol  1 drop Left Eye BID   feeding supplement  1 Container Oral TID BM   heparin  5,000 Units Subcutaneous Q8H   latanoprost  1 drop Both Eyes QHS   mouth rinse  15 mL Mouth Rinse q12n4p   Continuous Infusions:  vancomycin 750 mg (09/30/19 1143)     Time spent: 30 minutes with over 50% of the time coordinating the patient's care    Harold Hedge, DO [facility] Pager [phone]  Call night coverage person covering after 7pm

## 2019-09-30 NOTE — Progress Notes (Signed)
  Speech Language Pathology Treatment: Dysphagia  Patient Details Name: [name] MRN: [id number] DOB: May 11, 1931 Today's Date: 09/30/2019 Time: [id number] SLP Time Calculation (min) (ACUTE ONLY): 15 min  Assessment / Plan / Recommendation Clinical Impression  Today pt seen to assess po tolerance given dietary advancement.  NT reports pt with good intake "given she has not eaten much" with no coughing during po.  Pt reportedly consumed some banana, eggs and ? grits or oatmeal.  Minimal oral retention of secretions in anterior oral cavity present otherwise clear.  Pt willingly with hand over hand assist agreed to consume some chocolate Ensure via straw. Swallow was timely with clear voice following intake. Pt only accepted a few boluses - likely due to adequate intake with breakfast.  Pt has made signifcant progress with po intake and tolerance.  Will follow up briefly for tolerance/education of granddaughter as indicated.  Advised NT to use hand over hand assistance with pt to maximize safety/proprioception.    HPI HPI: 86yo female admitted 09/19/19 with diarrhea, n/v, sepsis from presumed colitis. PMH: HTN, HLD, CAD/CABG, CVA, advanced dementia, failure to thrive, poor oral intake. CXR = hazy opacity RML base - consolidation vs atelectasis  Pt has been lethargic and not accepting intake and SLP has spoken to granddaughter re: dementia/dysphagia. etc  Follow up for dysphagia management, education indicated.      SLP Plan  Continue with current plan of care       Recommendations  Diet recommendations: Dysphagia 3 (mechanical soft);Nectar-thick liquid(thin water between meals) Liquids provided via: Straw;Cup Medication Administration: Whole meds with puree Supervision: Staff to assist with self feeding Compensations: Slow rate;Small sips/bites;Other (Comment)(assure pt swallows before giving more) Postural Changes and/or Swallow Maneuvers: Seated upright 90 degrees;Upright 30-60 min after  meal                Oral Care Recommendations: Oral care BID Follow up Recommendations: 24 hour supervision/assistance SLP Visit Diagnosis: Dysphagia, unspecified (R13.10) Plan: Continue with current plan of care       GO                Macario Golds 09/30/2019, 4:01 PM  Kathleen Lime, MS [place] Office [phone]

## 2019-09-30 NOTE — Progress Notes (Signed)
    Durable Medical Equipment  (From admission, onward)         Start     Ordered   09/30/19 1532  For home use only DME Hospital bed  Once    Question Answer Comment  Length of Need Lifetime   Bed type Semi-electric      09/30/19 1532

## 2019-09-30 NOTE — Progress Notes (Signed)
Physical Therapy Treatment Patient Details Name: [name] MRN: [id number] DOB: 1931-06-22 Today's Date: 09/30/2019    History of Present Illness 86 year old female with history of hypertension, hyperlipidemia, CAD, CVA, dementia presented with diarrhea, vomiting, was admitted for sepsis from presumed colitis    PT Comments    Pt with slow progress.  She participated with some exercises with max cues and did assist with transfers with max multimodal cues.  Cont to advance as able.     Follow Up Recommendations  SNF     Equipment Recommendations  None recommended by PT    Recommendations for Other Services       Precautions / Restrictions Precautions Precautions: Fall    Mobility  Bed Mobility Overal bed mobility: Needs Assistance Bed Mobility: Supine to Sit;Sit to Supine;Rolling Rolling: Max assist   Supine to sit: +2 for physical assistance;Mod assist Sit to supine: Max assist;+2 for physical assistance   General bed mobility comments: Pt required multimodal cues to initiate transfers.  Pt able to assist sliding legs toward EOB and lifting trunk with max cues; bed pad to facilitate transfers  Transfers Overall transfer level: Needs assistance Equipment used: Rolling walker (2 wheeled) Transfers: Sit to/from Stand Sit to Stand: Max assist;+2 safety/equipment;+2 physical assistance;From elevated surface         General transfer comment: Pt reached for RW when placed in front of her; required max x 2 for standing and with posterior/R lean  Ambulation/Gait             General Gait Details: unable   Stairs             Wheelchair Mobility    Modified Rankin (Stroke Patients Only)       Balance Overall balance assessment: Needs assistance Sitting-balance support: Bilateral upper extremity supported;Feet supported Sitting balance-Leahy Scale: Poor Sitting balance - Comments: Pt initially maintained balance with close guarding for about 30 sec  but as she fatigued with R/posterior lean requiring mod A.  Sat EOB for 10 mins.  Tried to encourage posture/balance with reaching forward for functional task and providing assist but unable.  Pt only reaching forward when RW placed in front.     Standing balance-Leahy Scale: Poor Standing balance comment: posterior R lean requiring max x 2; provided facilitation and multimodal cues; pt stood ~20 sec w max x 2                            Cognition Arousal/Alertness: Lethargic Behavior During Therapy: Anxious Overall Cognitive Status: No family/caregiver present to determine baseline cognitive functioning                                 General Comments: Pt with hx of dementia; Pt kept eyes open and responded appropriatley to questions ~25% of the time      Exercises General Exercises - Lower Extremity Ankle Circles/Pumps: AROM;Seated;10 reps Long Arc Quad: AROM;Seated;15 reps(max cues for full ROM)    General Comments General comments (skin integrity, edema, etc.): VSS; expressed fear of fall; pt given max multimodal cues for transfers and cues for every movement      Pertinent Vitals/Pain Pain Assessment: No/denies pain    Home Living                      Prior Function  PT Goals (current goals can now be found in the care plan section) Acute Rehab PT Goals PT Goal Formulation: Patient unable to participate in goal setting Time For Goal Achievement: 10/06/19 Potential to Achieve Goals: Fair Progress towards PT goals: Progressing toward goals    Frequency    Min 2X/week      PT Plan Current plan remains appropriate    Co-evaluation              AM-PAC PT "6 Clicks" Mobility   Outcome Measure  Help needed turning from your back to your side while in a flat bed without using bedrails?: Total Help needed moving from lying on your back to sitting on the side of a flat bed without using bedrails?: Total Help needed  moving to and from a bed to a chair (including a wheelchair)?: Total Help needed standing up from a chair using your arms (e.g., wheelchair or bedside chair)?: Total Help needed to walk in hospital room?: Total Help needed climbing 3-5 steps with a railing? : Total 6 Click Score: 6    End of Session Equipment Utilized During Treatment: Gait belt Activity Tolerance: Patient tolerated treatment well Patient left: in bed;with call bell/phone within reach;with bed alarm set;with nursing/sitter in room Nurse Communication: Mobility status PT Visit Diagnosis: Difficulty in walking, not elsewhere classified (R26.2);Adult, failure to thrive (R62.7)     Time: [id number] PT Time Calculation (min) (ACUTE ONLY): 20 min  Charges:  $Therapeutic Activity: 8-22 mins                     Maggie Font, PT Acute Rehab Services Pager [phone] [facility] [phone] [facility] [phone]    Karlton Lemon 09/30/2019, 2:41 PM

## 2019-09-30 NOTE — Care Management Important Message (Signed)
Important Message  Patient Details IM Letter given to Marney Doctor RN Case Manager to present to the Patient Name: [name] MRN: [id number] Date of Birth: 1932-01-29   Medicare Important Message Given:  Yes     Kerin Salen 09/30/2019, 10:48 AM

## 2019-09-30 NOTE — Progress Notes (Signed)
Nutrition Follow-up  INTERVENTION:   -Boost Breeze po TID, each supplement provides 250 kcal and 9 grams of protein -Multivitamin with minerals daily  NUTRITION DIAGNOSIS:   Increased nutrient needs related to wound healing as evidenced by estimated needs.  Ongoing.  GOAL:   Patient will meet greater than or equal to 90% of their needs  Progressing.  MONITOR:   Supplement acceptance, Weight trends, PO intake, Labs, I & O's, Skin  ASSESSMENT:   86 year old female with history of hypertension, hyperlipidemia, CAD, CVA, dementia presented with diarrhea, vomiting, was admitted for sepsis from presumed colitisPatient had reported vomiting and diarrhea for 2 days, unable to eat or drink anything on the day of admission, possible complaining of rectal soreness per granddaughter.  Admitted for sepsis, acute infectious colitis.  Pt has continued to eat poorly. PO: 0-25%. Per SLP note 5/31, recommends dysphagia 3 with nectar thick liquids. Pt is drinking some Boost Breeze supplements.   Per Palliative care note, pt or family do not want a PEG.  Admission weight: 121 lbs. No new weights have been measured for this admission.  Medications reviewed. Labs reviewed: CBGs: 110-113  Diet Order:   Diet Order            DIET DYS 3 Room service appropriate? Yes; Fluid consistency: Nectar Thick  Diet effective now              EDUCATION NEEDS:   Not appropriate for education at this time  Skin:  Skin Assessment: Skin Integrity Issues: Skin Integrity Issues:: Stage II Stage II: mid sacrum  Last BM:  5/31 -type 7  Height:   Ht Readings from Last 1 Encounters:  09/25/19 5\' 4"  (1.626 m)    Weight:   Wt Readings from Last 1 Encounters:  09/20/19 55 kg    Ideal Body Weight:     BMI:  Body mass index is 20.81 kg/m.  Estimated Nutritional Needs:   Kcal:  1650-1850  Protein:  75-85g  Fluid:  1.8L/day  Clayton Bibles, MS, RD, LDN Inpatient Clinical  Dietitian Contact information available via Amion

## 2019-10-01 LAB — GLUCOSE, CAPILLARY: Glucose-Capillary: 97 mg/dL (ref 70–99)

## 2019-10-01 NOTE — Discharge Summary (Signed)
Physician Discharge Summary  [name] HWE:[id number] DOB: Dec 16, 1931 DOA: 09/19/2019  PCP: Carol Ada, MD  Admit date: 09/19/2019 Discharge date: 10/01/2019  Admitted From: Home Disposition: Home  Recommendations for Outpatient Follow-up:  1. Follow up with PCP in 1-2 weeks 2. Please obtain BMP/CBC in one week 3. Please follow up with your PCP on the following pending results: Unresulted Labs (From admission, onward)   None       Home Health: Yes Equipment/Devices: Hospital bed  Discharge Condition: Stable CODE STATUS: Full code Diet recommendation: Dysphagia 3  Subjective: Patient seen and examined.  No family member present.  Patient alert and oriented to place and person.  She denied any complaint.  Brief/Interim Summary: Patient is 86 year old female with history of hypertension, hyperlipidemia, CAD, CVA, vascular dementia presented with diarrhea, vomiting, was admitted for sepsis from presumed colitis. Over last 24 hours prior to admission, she had been less responsive. Per EMS, was initially hypotensive 90/50, improved with IV fluid bolus. In ED, lipase within normal limits, creatinine 2.35, alk phos 145, WBCs 23.8 lactate 2.1. UA negative. Chest x-ray hazy opacity in medial right lung base, early consolidation or atelectasis. CT abdomen pelvis showed marked circumferential thickening and adjacent inflammation centered upon the rectum and distal sigmoid no associated pneumatosis, free air. She was admitted with sepsis secondary to presumed colitis.  She was started on broad-spectrum antibiotics.  Subsequently Blood cultures grew positive for staph hominis and patient was started on vancomycin.  ID was consulted and they recommended total of 10 days of antibiotics which she completed today with vancomycin.  Patient's acute metabolic encephalopathy which was likely secondary to sepsis and elevated ammonia has also resolved.  She is now back to her baseline.  She is alert  and oriented to place and person.  This is her baseline due to vascular dementia.  She also had multiple electrolyte abnormalities which have resolved as well.  There was some concern about aspiration.  She was evaluated by SLP.  She was initially NPO.  Her diet was advanced to dysphagia 3 and she is tolerating well.  She has remained stable.  Hospital bed has been provided to her.  PT OT recommended SNF however family decided to take her home with home health.  She is being discharged in stable condition today.  I assumed her care today after she has been in the hospital for 10 days.  Before discharge, I tried calling patient's granddaughter, [name], who she lives with however I was not able to connect with her and her voice mailbox was full.   Discharge Diagnoses:  Principal Problem:   Sepsis ([place]) Active Problems:   CAD (coronary artery disease), autologous vein bypass graft   Essential hypertension   Hyperlipidemia LDL goal <70   Memory loss   Dementia without behavioral disturbance ([name])   AKI (acute kidney injury) ([place])   Colitis   Pressure injury of skin   Goals of care, counseling/discussion   Palliative care by specialist   Dysphagia    Discharge Instructions  Discharge Instructions    Discharge patient   Complete by: As directed    Discharge disposition: 06-Home-Health Care Svc   Discharge patient date: 10/01/2019     Allergies as of 10/01/2019      Reactions   Nsaids Other (See Comments)   URINARY RETENTION   Statins Other (See Comments)   MYALGIAS HURTS ALL OVER   Tricor [fenofibrate] Other (See Comments)   MYALGIAS HURTS ALL OVER  Zetia [ezetimibe] Other (See Comments)   MYALGIAS HURTS ALL OVER   Lescol [fluvastatin]    Muscle pain   Livalo [pitavastatin]    Muscle pain   Niaspan [niacin]    Severe flushing   Relafen [nabumetone] Rash      Medication List    TAKE these medications   Alphagan P 0.1 % Soln Generic drug: brimonidine Place 1  drop into the left eye in the morning and at bedtime.   aspirin EC 81 MG tablet Take 81 mg by mouth at bedtime.   atenolol 25 MG tablet Commonly known as: TENORMIN Take 1 tablet (25 mg total) daily by mouth. May take an additional 25 mg of medication daily, if SBP> 160 or DBP >100 What changed: when to take this   clopidogrel 75 MG tablet Commonly known as: PLAVIX Take 1 tablet (75 mg total) by mouth daily.   donepezil 10 MG tablet Commonly known as: ARICEPT Take 1 tablet (10 mg total) by mouth at bedtime.   dorzolamide-timolol 22.3-6.8 MG/ML ophthalmic solution Commonly known as: COSOPT Place 1 drop into the left eye 2 (two) times daily.   latanoprost 0.005 % ophthalmic solution Commonly known as: XALATAN Place 1 drop into both eyes at bedtime.   lisinopril 20 MG tablet Commonly known as: ZESTRIL Take 20 mg by mouth at bedtime.   memantine 5 MG tablet Commonly known as: NAMENDA Take 1 tablet (5 mg total) by mouth 2 (two) times daily.   mirtazapine 7.5 MG tablet Commonly known as: REMERON Take 7.5 mg by mouth at bedtime as needed (sleep/anxiety).   Vitamin D3 50 MCG (2000 UT) capsule Take 1 capsule (2,000 Units total) by mouth at bedtime.            Durable Medical Equipment  (From admission, onward)         Start     Ordered   10/01/19 0840  For home use only DME Hospital bed  Once    Question Answer Comment  Length of Need Lifetime   Patient has (list medical condition): Aspiration/dysphagia, dementia   The above medical condition requires: Patient requires the ability to reposition frequently   Head must be elevated greater than: 30 degrees   Bed type Semi-electric      10/01/19 0842         Follow-up Information    Carol Ada, MD Follow up in 1 week(s).   Specialty: Family Medicine Contact information: [address] [place] [address] [phone]        Leonie Man, MD .   Specialty: Cardiology Contact  information: [address] [place] [address] [phone]          Allergies  Allergen Reactions   Nsaids Other (See Comments)    URINARY RETENTION   Statins Other (See Comments)    MYALGIAS HURTS ALL OVER   Tricor [Fenofibrate] Other (See Comments)    MYALGIAS HURTS ALL OVER   Zetia [Ezetimibe] Other (See Comments)    MYALGIAS HURTS ALL OVER   Lescol [Fluvastatin]     Muscle pain   Livalo [Pitavastatin]     Muscle pain   Niaspan [Niacin]     Severe flushing    Relafen [Nabumetone] Rash    Consultations: ID and palliative care   Procedures/Studies: CT ABDOMEN PELVIS WO CONTRAST  Result Date: 09/20/2019 CLINICAL DATA:  Nausea and vomiting EXAM: CT ABDOMEN AND PELVIS WITHOUT CONTRAST TECHNIQUE: Multidetector CT imaging of the  abdomen and pelvis was performed following the standard protocol without IV contrast. COMPARISON:  CT 07/15/2019 FINDINGS: Lower chest: Small to moderate right pleural effusion with some adjacent passive atelectasis. Atelectatic changes present the left lung base as well. Normal heart size. No pericardial effusion. Atherosclerotic calcifications noted upon the coronary arteries as well as the included portions of the thoracic aorta. No pericardial effusion. Hepatobiliary: No visible liver lesions. Smooth liver surface contour. Normal hepatic attenuation. Mild distension of the gallbladder with dependently layering calcified gallstones towards the gallbladder neck. No visible intraductal gallstones or biliary ductal dilatation is seen. Pancreas: Diffuse pancreatic atrophy. No concerning pancreatic lesions, a inflammation, or ductal dilatation. Spleen: Normal spleen. Small accessory splenule towards the hilum. Adrenals/Urinary Tract: Normal adrenal glands. Stable appearance of a fluid attenuation 3 cm cyst in the posterior right interpolar kidney no concerning renal mass. No urolithiasis or hydronephrosis. Urinary bladder is largely  decompressed at the time of exam and therefore poorly evaluated by CT imaging. No gross bladder abnormality. Stomach/Bowel: Distal esophagus, stomach and duodenal sweep are unremarkable. No small bowel wall thickening or dilatation. No evidence of obstruction. Few sparse small bowel diverticula are noted no visible small bowel diverticular inflammation is seen however no proximal colonic wall thickening or dilatation. There is marked distal colonic inflammatory change extending in contiguous fashion from the level of the rectum through the distal sigmoid. Extensive stranding and inflammatory change noted in the perirectal and pericolonic fat. No free fluid or free air. No associated pneumatosis. Vascular/Lymphatic: Atherosclerotic calcifications throughout the abdominal aorta and branch vessels. No aneurysm or ectasia. No enlarged abdominopelvic lymph nodes. Reproductive: Normal appearance of the uterus and adnexal structures. Other: Inflammatory changes centered upon the rectosigmoid as above. Postsurgical changes noted in lateral left hip soft tissues. No abdominopelvic free air or fluid. No bowel containing hernia. Musculoskeletal: Multilevel degenerative changes are present in the imaged portions of the spine. Retrolisthesis of L2 on L3 and grade 1 anterolisthesis L4 on L5 is similar to prior. Surgical anchors noted in the right greater trochanter. Additional degenerative changes noted in both hips. Sclerotic changes thickening at the symphysis pubis are chronic and may reflect sequela of prior osteitis pubis. IMPRESSION: 1. Marked circumferential thickening and adjacent inflammation centered upon the rectum and distal sigmoid. No free fluid or free air. No associated pneumatosis. Differential considerations include infectious or inflammatory colitis versus less likely ischemic colitis. 2. Small to moderate right pleural effusion with some adjacent passive atelectasis. 3. Cholelithiasis without CT evidence of  acute cholecystitis. 4. Aortic Atherosclerosis (ICD10-I70.0). Electronically Signed   By: Lovena Le M.D.   On: 09/20/2019 00:09   DG Lumbar Spine Complete  Result Date: 09/01/2019 CLINICAL DATA:  Hip pain after fall. EXAM: LUMBAR SPINE - COMPLETE 4+ VIEW COMPARISON:  July 14, 2019. FINDINGS: Mild grade 1 retrolisthesis of L1-2 and L2-3 is noted secondary to moderate degenerative disc disease at these levels. Mild grade 1 anterolisthesis of L4-5 is noted secondary to posterior facet joint hypertrophy. Severe degenerative disc disease is noted at L5-S1. No fracture is noted. Atherosclerosis of abdominal aorta is noted. IMPRESSION: Multilevel degenerative disc disease. No acute abnormality seen in the lumbar spine. Aortic Atherosclerosis (ICD10-I70.0). Electronically Signed   By: Marijo Conception M.D.   On: 09/01/2019 13:34   CT Head Wo Contrast  Result Date: 09/01/2019 CLINICAL DATA:  Unwitnessed fall EXAM: CT HEAD WITHOUT CONTRAST TECHNIQUE: Contiguous axial images were obtained from the base of the skull through the vertex without intravenous contrast.  COMPARISON:  07/14/2019 FINDINGS: Brain: No evidence of acute infarction, hemorrhage, hydrocephalus, extra-axial collection or mass lesion/mass effect. Extensive low-density changes within the periventricular and subcortical white matter compatible with chronic microvascular ischemic change. Mild-moderate diffuse cerebral volume loss. Vascular: Atherosclerotic calcifications involving the large vessels of the skull base. No unexpected hyperdense vessel. Skull: Normal. Negative for fracture or focal lesion. Sinuses/Orbits: No acute finding. Other: None. IMPRESSION: 1.  No acute intracranial findings. 2. Chronic microvascular ischemic change and cerebral volume loss, stable from prior. Electronically Signed   By: Davina Poke D.O.   On: 09/01/2019 14:15   DG Chest Port 1 View  Result Date: 09/19/2019 CLINICAL DATA:  Sepsis, nausea vomiting and diarrhea  with decreased appetite since Thursday. EXAM: PORTABLE CHEST 1 VIEW COMPARISON:  Radiograph 07/14/2019 FINDINGS: There is some hazy opacity in the medial right lung base, increased from prior. Remaining portions the lungs are predominantly clear. No pneumothorax or effusion. No convincing features of edema. Postsurgical changes related to prior CABG including aligned sternotomy wires and multiple surgical clips projecting over the mediastinum. Inferior most sternal suture is fractured, similar to comparison. The aorta is calcified. The remaining cardiomediastinal contours are unremarkable. Implantable loop recorder is noted. No acute osseous or soft tissue abnormality. Degenerative changes are present in the imaged spine and shoulders. IMPRESSION: 1. Hazy opacity in the medial right lung base, increased from prior. Findings could reflect early consolidation or atelectasis. 2. Stable postoperative changes related to prior CABG. Inferior most sternal suture is fractured, similar to comparison. 3.  Aortic Atherosclerosis (ICD10-I70.0). Electronically Signed   By: Lovena Le M.D.   On: 09/19/2019 23:12   DG Hips Bilat W or Wo Pelvis 3-4 Views  Result Date: 09/01/2019 CLINICAL DATA:  Status post fall, left hip pain EXAM: DG HIP (WITH OR WITHOUT PELVIS) 3-4V BILAT COMPARISON:  None. FINDINGS: Generalized osteopenia. No acute fracture or dislocation. 2 bone anchors are present in the right greater trochanter likely from gluteus tendon repair. Mild joint space narrowing of bilateral hips consistent with mild osteoarthritis. No aggressive osseous lesion. Lower lumbar spine spondylosis. Mild osteoarthritis of bilateral SI joints. Degenerative changes of the pubic symphysis. IMPRESSION: No acute osseous injury of bilateral hips. Given the patient's age and osteopenia, if there is persistent clinical concern for an occult hip fracture, a MRI of the hip is recommended for increased sensitivity. Electronically Signed   By:  Kathreen Devoid   On: 09/01/2019 13:33      Discharge Exam: Vitals:   09/30/19 2205 10/01/19 0623  BP: 138/69 131/66  Pulse: 95 73  Resp: 19 17  Temp: 97.7 F (36.5 C) (!) 97.3 F (36.3 C)  SpO2: 96% 99%   Vitals:   09/30/19 1107 09/30/19 1210 09/30/19 2205 10/01/19 0623  BP: (!) 141/75 (!) 142/74 138/69 131/66  Pulse: 98 95 95 73  Resp:  '18 19 17  ' Temp:  97.6 F (36.4 C) 97.7 F (36.5 C) (!) 97.3 F (36.3 C)  TempSrc:  Axillary Oral Oral  SpO2:  95% 96% 99%  Weight:      Height:        General: Pt is alert, awake, not in acute distress Cardiovascular: RRR, S1/S2 +, no rubs, no gallops Respiratory: CTA bilaterally, no wheezing, no rhonchi Abdominal: Soft, NT, ND, bowel sounds + Extremities: Trace pitting edema bilateral lower extremity, no cyanosis    The results of significant diagnostics from this hospitalization (including imaging, microbiology, ancillary and laboratory) are listed below for reference.  Microbiology: Recent Results (from the past 240 hour(s))  Culture, blood (Routine X 2) w Reflex to ID Panel     Status: None   Collection Time: 09/22/19  2:02 PM   Specimen: BLOOD LEFT HAND  Result Value Ref Range Status   Specimen Description   Final    BLOOD LEFT HAND Performed at [facility] [address] [place], [place] [address]    Special Requests   Final    BOTTLES DRAWN AEROBIC ONLY Blood Culture adequate volume Performed at [facility] [address], [place], [place] [address]    Culture   Final    NO GROWTH 5 DAYS Performed at [facility], [place] [address], [place], [place] [address]    Report Status 09/27/2019 FINAL  Final  Culture, blood (Routine X 2) w Reflex to ID Panel     Status: None   Collection Time: 09/22/19  2:21 PM   Specimen: BLOOD LEFT HAND  Result Value Ref Range Status   Specimen Description   Final    BLOOD LEFT HAND Performed at [facility]  [address] [place], [place] [address]    Special Requests   Final    BOTTLES DRAWN AEROBIC ONLY Blood Culture results may not be optimal due to an inadequate volume of blood received in culture bottles Performed at [facility] [address], [place], [place] [address]    Culture   Final    NO GROWTH 5 DAYS Performed at [facility], [place] [address], [place], [place] [address]    Report Status 09/27/2019 FINAL  Final     Labs: BNP (last 3 results) Recent Labs    09/22/19 0457  BNP 250.0*   Basic Metabolic Panel: Recent Labs  Lab 09/25/19 0551 09/26/19 0541 09/27/19 0549 09/28/19 0539 09/30/19 0438  NA 134* 134* 134* 139 140  K 3.7 3.7 3.1* 3.5 3.9  CL 102 105 105 108 107  CO2 23 20* 20* 22 25  GLUCOSE 113* 98 100* 134* 113*  BUN '9 11 14 17 15  ' CREATININE 0.55 0.61 0.71 0.65 0.59  CALCIUM 7.6* 7.7* 8.0* 8.0* 8.2*  MG 1.9 1.6* 1.8  --   --   PHOS  --  2.2* 3.1  --   --    Liver Function Tests: No results for input(s): AST, ALT, ALKPHOS, BILITOT, PROT, ALBUMIN in the last 168 hours. No results for input(s): LIPASE, AMYLASE in the last 168 hours. Recent Labs  Lab 09/25/19 1852 09/27/19 0620  AMMONIA 54* 33   CBC: Recent Labs  Lab 09/26/19 0541 09/27/19 0549 09/28/19 0539  WBC 9.5 10.0 9.9  HGB 10.9* 9.6* 10.1*  HCT 34.6* 30.2* 31.0*  MCV 98.9 95.0 93.7  PLT 334 376 467*   Cardiac Enzymes: No results for input(s): CKTOTAL, CKMB, CKMBINDEX, TROPONINI in the last 168 hours. BNP: Invalid input(s): POCBNP CBG: Recent Labs  Lab 09/30/19 0617 09/30/19 1221 09/30/19 1804 09/30/19 2348 10/01/19 0626  GLUCAP 110* 113* 132* 106* 97   D-Dimer No results for input(s): DDIMER in the last 72 hours. Hgb A1c No results for input(s): HGBA1C in the last 72 hours. Lipid Profile No results for input(s): CHOL, HDL, LDLCALC, TRIG, CHOLHDL, LDLDIRECT in the last 72 hours. Thyroid function studies No results for input(s): TSH, T4TOTAL,  T3FREE, THYROIDAB in the last 72 hours.  Invalid input(s): FREET3 Anemia work up No results for input(s): VITAMINB12, FOLATE, FERRITIN,  TIBC, IRON, RETICCTPCT in the last 72 hours. Urinalysis    Component Value Date/Time   COLORURINE YELLOW 09/19/2019 2044   APPEARANCEUR CLEAR 09/19/2019 2044   APPEARANCEUR Clear 01/27/2016 1115   LABSPEC 1.016 09/19/2019 2044   PHURINE 5.0 09/19/2019 2044   GLUCOSEU NEGATIVE 09/19/2019 2044   HGBUR NEGATIVE 09/19/2019 2044   BILIRUBINUR NEGATIVE 09/19/2019 2044   BILIRUBINUR Negative 01/27/2016 1115   KETONESUR NEGATIVE 09/19/2019 2044   PROTEINUR NEGATIVE 09/19/2019 2044   NITRITE NEGATIVE 09/19/2019 2044   LEUKOCYTESUR NEGATIVE 09/19/2019 2044   Sepsis Labs Invalid input(s): PROCALCITONIN,  WBC,  LACTICIDVEN Microbiology Recent Results (from the past 240 hour(s))  Culture, blood (Routine X 2) w Reflex to ID Panel     Status: None   Collection Time: 09/22/19  2:02 PM   Specimen: BLOOD LEFT HAND  Result Value Ref Range Status   Specimen Description   Final    BLOOD LEFT HAND Performed at [facility], [place] [address] [place], [place] [address]    Special Requests   Final    BOTTLES DRAWN AEROBIC ONLY Blood Culture adequate volume Performed at [facility] [address], [place], [place] [address]    Culture   Final    NO GROWTH 5 DAYS Performed at [facility], [place] [address], [place], [place] [address]    Report Status 09/27/2019 FINAL  Final  Culture, blood (Routine X 2) w Reflex to ID Panel     Status: None   Collection Time: 09/22/19  2:21 PM   Specimen: BLOOD LEFT HAND  Result Value Ref Range Status   Specimen Description   Final    BLOOD LEFT HAND Performed at [facility] [address], [place], [place] [address]    Special Requests   Final    BOTTLES DRAWN AEROBIC ONLY Blood Culture results may not be optimal due to an inadequate volume of blood received in  culture bottles Performed at [facility] [address], [place], [place] [address]    Culture   Final    NO GROWTH 5 DAYS Performed at [facility], [place] [address] [place], [place] [address]    Report Status 09/27/2019 FINAL  Final     Time coordinating discharge: Over 30 minutes  SIGNED:   Darliss Cheney, MD  [facility] 10/01/2019, 10:00 AM  If 7PM-7AM, please contact night-coverage [email or web address]

## 2019-10-01 NOTE — Plan of Care (Signed)
Pt mildly hypertensive this am.  Scheduled BP meds administered.  No complaints of pain at this time.     Problem: Activity: Goal: Risk for activity intolerance will decrease Outcome: Progressing   Problem: Elimination: Goal: Will not experience complications related to bowel motility Outcome: Progressing Goal: Will not experience complications related to urinary retention Outcome: Progressing   Problem: Safety: Goal: Ability to remain free from injury will improve Outcome: Progressing   Problem: Skin Integrity: Goal: Risk for impaired skin integrity will decrease Outcome: Progressing   Problem: Nutrition: Goal: Adequate nutrition will be maintained Outcome: Not Progressing

## 2019-10-01 NOTE — Progress Notes (Signed)
    Durable Medical Equipment  (From admission, onward)         Start     Ordered   10/01/19 0840  For home use only DME Hospital bed  Once    Question Answer Comment  Length of Need Lifetime   Patient has (list medical condition): Aspiration/dysphagia, dementia   The above medical condition requires: Patient requires the ability to reposition frequently   Head must be elevated greater than: 30 degrees   Bed type Semi-electric      10/01/19 0842

## 2019-10-01 NOTE — Discharge Instructions (Signed)
Colitis ° °Colitis is inflammation of the colon. Colitis may last a short time (be acute), or it may last a long time (become chronic). °What are the causes? °This condition may be caused by: °· Viruses. °· Bacteria. °· Reaction to medicine. °· Certain autoimmune diseases such as Crohn's disease or ulcerative colitis. °· Radiation treatment. °· Decreased blood flow to the bowel (ischemia). °What are the signs or symptoms? °Symptoms of this condition include: °· Watery diarrhea. °· Passing bloody or tarry stool. °· Pain. °· Fever. °· Vomiting. °· Tiredness (fatigue). °· Weight loss. °· Bloating. °· Abdominal pain. °· Having fewer bowel movements than usual. °· A strong and sudden urge to have a bowel movement. °· Feeling like the bowel is not empty after a bowel movement. °How is this diagnosed? °This condition is diagnosed with a stool test or a blood test. °You may also have other tests, such as: °· X-rays. °· CT scan. °· Colonoscopy. °· Endoscopy. °· Biopsy. °How is this treated? °Treatment for this condition depends on the cause. The condition may be treated by: °· Resting the bowel. This involves not eating or drinking for a period of time. °· Fluids that are given through an IV. °· Medicine for pain and diarrhea. °· Antibiotic medicines. °· Cortisone medicines. °· Surgery. °Follow these instructions at home: °Eating and drinking ° °· Follow instructions from your health care provider about eating or drinking restrictions. °· Drink enough fluid to keep your urine pale yellow. °· Work with a dietitian to determine which foods cause your condition to flare up. °· Avoid foods that cause flare-ups. °· Eat a well-balanced diet. °General instructions °· If you were prescribed an antibiotic medicine, take it as told by your health care provider. Do not stop taking the antibiotic even if you start to feel better. °· Take over-the-counter and prescription medicines only as told by your health care provider. °· Keep all  follow-up visits as told by your health care provider. This is important. °Contact a health care provider if: °· Your symptoms do not go away. °· You develop new symptoms. °Get help right away if you: °· Have a fever that does not go away with treatment. °· Develop chills. °· Have extreme weakness, fainting, or dehydration. °· Have repeated vomiting. °· Develop severe pain in your abdomen. °· Pass bloody or tarry stool. °Summary °· Colitis is inflammation of the colon. Colitis may last a short time (be acute), or it may last a long time (become chronic). °· Treatment for this condition depends on the cause and may include resting the bowel, taking medicines, or having surgery. °· If you were prescribed an antibiotic medicine, take it as told by your health care provider. Do not stop taking the antibiotic even if you start to feel better. °· Get help right away if you develop severe pain in your abdomen. °· Keep all follow-up visits as told by your health care provider. This is important. °This information is not intended to replace advice given to you by your health care provider. Make sure you discuss any questions you have with your health care provider. °Document Revised: 10/18/2017 Document Reviewed: 10/18/2017 °Elsevier Patient Education © 2020 [employer] ° °

## 2019-10-01 NOTE — TOC Transition Note (Signed)
Transition of Care [facility]) - CM/SW Discharge Note   Patient Details  Name: [name] MRN: [id number] Date of Birth: 02-Oct-1931  Transition of Care [facility]) CM/SW Contact:  Lynnell Catalan, RN Phone Number: 10/01/2019, 10:28 AM   Clinical Narrative:     Lengthy conversation had with granddaughter [name] yesterday about dc planning. [name] states that pt has 24hr caregivers and was active with [facility] for home health PT. [name] states that she would rather have pt home than at [facility]. She would like to continue home health services with [facility]. Orders for HHPT/SLP received. This CM contacted [facility] office for resumption of services. [name] also requesting hospital bed for home. Order received and [facility] liaison contacted. Per [name] pt will need to transport home via [place].   Final next level of care: [place] Barriers to Discharge: Continued Medical Work up            DME Arranged: Hospital bed DME Agency: [facility] Date DME Agency Contacted: 09/30/19   Representative spoke with at DME Agency: Thedore Mins HH Arranged: PT, Speech Therapy HH Agency: [facility] Date [place] Agency Contacted: 10/01/19

## 2019-10-06 DIAGNOSIS — Z09 Encounter for follow-up examination after completed treatment for conditions other than malignant neoplasm: Secondary | ICD-10-CM | POA: Diagnosis not present

## 2019-10-06 DIAGNOSIS — R1310 Dysphagia, unspecified: Secondary | ICD-10-CM | POA: Diagnosis not present

## 2019-10-06 DIAGNOSIS — Z8619 Personal history of other infectious and parasitic diseases: Secondary | ICD-10-CM | POA: Diagnosis not present

## 2019-10-06 DIAGNOSIS — F0150 Vascular dementia without behavioral disturbance: Secondary | ICD-10-CM | POA: Diagnosis not present

## 2019-10-06 DIAGNOSIS — L89322 Pressure ulcer of left buttock, stage 2: Secondary | ICD-10-CM | POA: Diagnosis not present

## 2019-10-07 ENCOUNTER — Telehealth: Payer: Self-pay | Admitting: Internal Medicine

## 2019-10-07 NOTE — Telephone Encounter (Signed)
Phone call placed to patient to offer to schedule a visit with [facility]. Phone rang, with no answer I left a voicemail for call back. 

## 2019-10-11 DIAGNOSIS — M6281 Muscle weakness (generalized): Secondary | ICD-10-CM | POA: Diagnosis not present

## 2019-10-11 DIAGNOSIS — F0390 Unspecified dementia without behavioral disturbance: Secondary | ICD-10-CM | POA: Diagnosis not present

## 2019-10-11 DIAGNOSIS — L89153 Pressure ulcer of sacral region, stage 3: Secondary | ICD-10-CM | POA: Diagnosis not present

## 2019-10-11 DIAGNOSIS — R1312 Dysphagia, oropharyngeal phase: Secondary | ICD-10-CM | POA: Diagnosis not present

## 2019-10-11 DIAGNOSIS — R4701 Aphasia: Secondary | ICD-10-CM | POA: Diagnosis not present

## 2019-10-11 DIAGNOSIS — R269 Unspecified abnormalities of gait and mobility: Secondary | ICD-10-CM | POA: Diagnosis not present

## 2019-10-11 DIAGNOSIS — R531 Weakness: Secondary | ICD-10-CM | POA: Diagnosis not present

## 2019-10-11 DIAGNOSIS — Z9181 History of falling: Secondary | ICD-10-CM | POA: Diagnosis not present

## 2019-10-11 DIAGNOSIS — I10 Essential (primary) hypertension: Secondary | ICD-10-CM | POA: Diagnosis not present

## 2019-10-14 ENCOUNTER — Telehealth: Payer: Self-pay | Admitting: Internal Medicine

## 2019-10-14 NOTE — Telephone Encounter (Signed)
Scheduled [facility] visit for 10-16-19 at 11:00. 

## 2019-10-14 NOTE — Telephone Encounter (Signed)
Phone call placed to patient to offer to schedule a visit with [facility]. Phone rang, with no answer I left a voicemail for call back. 

## 2019-10-16 ENCOUNTER — Other Ambulatory Visit: Payer: Federal, State, Local not specified - PPO | Admitting: Internal Medicine

## 2019-10-16 ENCOUNTER — Encounter: Payer: Self-pay | Admitting: Internal Medicine

## 2019-10-16 ENCOUNTER — Other Ambulatory Visit: Payer: Self-pay

## 2019-10-16 DIAGNOSIS — Z7189 Other specified counseling: Secondary | ICD-10-CM

## 2019-10-16 DIAGNOSIS — Z515 Encounter for palliative care: Secondary | ICD-10-CM

## 2019-10-16 NOTE — Progress Notes (Signed)
June 17th, 2021 [place] Consult Note Telephone: [phone]  Fax: [phone]  PATIENT NAME: [name] DOB: [birth year] MRN: [id number]  [address], [place] [address]  PRIMARY CARE PROVIDER: Debbora Presto NP/ Carol Ada, MD   REFERRING PROVIDER:  Carol Ada, [place],   [id number]   RESPONSIBLE PARTY:   [name], [name] (Granddaughter)  [phone] (M). [phone] (H)  ASSESSMENT / RECOMMENDATIONS:  1. Advance Care Planning: A. Directives: Granddaughter/HCPOA [name] states she has patient's DNR form "somewhere in the house". [name] wished to defer filling out a new form at this time. I think she is on the fence regarding code status. B. Goals of Care: To keep patient comfortable and as happy as she can be. Patient finds enjoyment in the household little dogs, and in music. Enjoys certain foods, and sweets. We did discuss on what future hospitalizations might look like, in patient's current frail state. We discussed options to be considered in the event of a medical crisis, such as use of ventilator, antibiotics, IVFs, and tube feedings. Or even if [name] believes a hospitalization would be in patient's best interest, or part of her longterm goals. [name] needs to think about and digest this. We did discuss in brief the scope and nature of Hospice services, should patient qualify.  2. Cognitive / Functional status:  Patient with vascular dementia. She is currently somnolent, sleeping majority of the day unless directly engaged. She is HOH. [name] reports yesterday a better day; awake all day and more interactive. Patient is A & O to person and place.   [name] reports patient was ambulating about independently until a fall early in May. She was in rehab for 2 weeks ([facility]), then memory care [facility]) for 2 weeks (to allow time for [name] to arrange for  adequate services her in the home). She was home only briefly before readmission late May for sepsis 2/2 colitis. She's been home just about 2 weeks. Home PT is working with patient; yesterday she transferred with heavy assist to chair; able to tolerate sitting up for 80min or so. Patient is too weak to reposition herself in bed. She has an electric bed. She is a total assist for all ADLs. She is incontinent of urine /stool d/t bed ridden; able to communicate that she needs to void/defecate.   Patient has 2 pressure injuries, one on her L hip and the other in sacral area. Wound care managed by [facility] bid-tid. Patient consumes 1-2 meals a day (<25%) with bites of snacks. Variable appetite. Ensure supplements. Liquids thickened with nectar thickener for dysphagia. SLP following patient in the home. Family follows aspiration precautions as outlined by therapist. No signs of aspiration. Christine notes improved since hospitalization.  Patient with intermittent pain from pressure injuries, managed with prn Tylenol  -recommended regularly scheduled Tylenol 1-2 tabs tid; also premed 1 hr before dressing changes.  3. Family Supports: Patient and her granddaughter [name] share a home. Patient had 2 sons, both deceased. [name] was raised by her grandmother and feels she is more like a mother to her. Patient has 24/7 personal care coverage by live in aides, who rotate on a weekly basis. They also provide some light housekeeping. PT/SLP following.  -we discussed incorporating passive and active ROM exercises into aide routine. Also some interactive activities the aides can do to help keep patient engaged.   4. Follow up Palliative Care Visit: [name] will call  me on a prn basis, including if further signs of patient decline, or if wish to complete advance care directives. She has my contact information.   I spent 60 minutes providing this consultation from 11am - noon. More than 50% of the time in  this consultation was spent coordinating communication.   HISTORY OF PRESENT ILLNESS:  [name] is an 86 y.o. female with history of HTN, hyperlipidemia, CAD (CABG), CVA, vascular dementia. -5/21-10/01/2019: sepsis 2/2 colitis, AKI, pressure injury of the skin, metabolic encephalopathy, dysphagia.  -09/01/2019: ER s/p fall Palliative Care was asked to help address goals of care.   CODE STATUS: per granddaughter DNR, though form not readily available, and granddaughter didn't wish to fill form out today.   PPS: 30%  HOSPICE ELIGIBILITY/DIAGNOSIS: TBD  PAST MEDICAL HISTORY:  Past Medical History:  Diagnosis Date  . Actinic keratosis   . Allergic rhinitis   . Aneurysm ([place])   . Arthritis    osteoarthritis, back, hands, wrists  . Basal cell carcinoma   . Bursitis    hips bilat   . CAD (coronary artery disease), autologous vein bypass graft March 2008   Follow-up cath: December 2015 Occluded SVG-D1 and occluded SVG-RI.  [name] CAD in native artery 2005, 03/2014   a.  Severe disease noted in LAD & RI --> referred for CABG x3; b.  Follow-up cath for abnormal Myoview: Occluded SVG-RI and SVG-D1 with patent LIMA-LAD and competitive flow.  60-70% LAD and RI lesions.  Otherwise minimal disease.  EF 70%.  . Cancer ([place])    + basal cell- on leg  . Cataract   . Dementia ([place])   . Dyslipidemia    Statin intolerant  . Glaucoma   . History of kidney stones   . Hypertension   . Non-STEMI (non-ST elevated myocardial infarction) [facility]) October 2005   EF by 35-40%, echo 40-50%. Angiography: 99% mid LAD involving D1 followed by 70% mid LAD; 80% RI. --> CABG  . Osteoarthritis   . PONV (postoperative nausea and vomiting)   . S/P CABG x 01 February 2004   LIMA-LAD, SVG-D1, SVG-RI.  [name] Statin intolerance   . Stroke [facility])     SOCIAL HX:  Social History   Tobacco Use  . Smoking status: Never Smoker  . Smokeless tobacco: Never Used  Substance Use Topics  . Alcohol use: Not Currently     Alcohol/week: 1.0 standard drink    Types: 1 Standard drinks or equivalent per week    Comment: 1 glass of liquor weekly     ALLERGIES:  Allergies  Allergen Reactions  . Nsaids Other (See Comments)    URINARY RETENTION  . Statins Other (See Comments)    MYALGIAS HURTS ALL OVER  . Tricor [Fenofibrate] Other (See Comments)    MYALGIAS HURTS ALL OVER  . Zetia [Ezetimibe] Other (See Comments)    MYALGIAS HURTS ALL OVER  . Lescol [Fluvastatin]     Muscle pain  . Livalo [Pitavastatin]     Muscle pain  . Niaspan [Niacin]     Severe flushing   . Relafen [Nabumetone] Rash     PERTINENT MEDICATIONS:  Outpatient Encounter Medications as of 10/16/2019  Medication Sig  . acetaminophen (TYLENOL) 500 MG tablet Take 500 mg by mouth every 6 (six) hours as needed. 1-2 tid; no more than 3000mg /day  . aspirin EC 81 MG tablet Take 81 mg by mouth at bedtime.   [name] atenolol (TENORMIN) 25 MG tablet Take 1 tablet (25 mg  total) daily by mouth. May take an additional 25 mg of medication daily, if SBP> 160 or DBP >100 (Patient taking differently: Take 25 mg by mouth at bedtime. May take an additional 25 mg of medication daily, if SBP> 160 or DBP >100)  . brimonidine (ALPHAGAN P) 0.1 % SOLN Place 1 drop into the left eye in the morning and at bedtime.  . clopidogrel (PLAVIX) 75 MG tablet Take 1 tablet (75 mg total) by mouth daily.  . dorzolamide-timolol (COSOPT) 22.3-6.8 MG/ML ophthalmic solution Place 1 drop into the left eye 2 (two) times daily.   [name] latanoprost (XALATAN) 0.005 % ophthalmic solution Place 1 drop into both eyes at bedtime.  [name] lisinopril (PRINIVIL,ZESTRIL) 20 MG tablet Take 20 mg by mouth at bedtime.   . Cholecalciferol (VITAMIN D3) 50 MCG (2000 UT) capsule Take 1 capsule (2,000 Units total) by mouth at bedtime. (Patient not taking: Reported on 10/16/2019)  . donepezil (ARICEPT) 10 MG tablet Take 1 tablet (10 mg total) by mouth at bedtime. (Patient not taking: Reported on 10/16/2019)  .  memantine (NAMENDA) 5 MG tablet Take 1 tablet (5 mg total) by mouth 2 (two) times daily. (Patient not taking: Reported on 10/16/2019)  . mirtazapine (REMERON) 7.5 MG tablet Take 7.5 mg by mouth at bedtime as needed (sleep/anxiety).  (Patient not taking: Reported on 10/16/2019)   No facility-administered encounter medications on file as of 10/16/2019.    PHYSICAL EXAM:   General: NAD, frail appearing, thin, elderly female lying in bed on her R side. Granddaughter [name] in attendance. Patient is somnolent; awakens to voice. HOH. Breathing without dyspnea. Extremities: bilateral UE edema R > >L. Softly pitting LE edema R>L to low shin.  Skin: Dressings intact R hip and sacrum.  Neurological: Weakness   Julianne Handler, NP

## 2019-10-21 ENCOUNTER — Telehealth: Payer: Self-pay | Admitting: Internal Medicine

## 2019-10-21 NOTE — Telephone Encounter (Signed)
10/21/2019: TC from daughter [name]. [name] reports that patient is progressing in strength and endurance. She has been working with PT, and was able to sit up in the chair and bed side commode. She was up in her recliner for about 30 min. She is more alert and conversant. No signs of choking or aspiration with eating. Consuming 25-50% of 2-3 meals a day. [name] believes improvement in pain control from sacral and R hip pressure injuries with regularly scheduled Tylenol. Granddaughter asking about hospice eligibility.  I discussed case with Dr. Lyman Speller. Felt patient hospice eligible when family decision for end of life care, no plans for hospitalizations or further PT/OT. I discussed with granddaughter [name] who is understanding. [name] did not wish to schedule a f/u with me on a specific date, but will call me prn as needed, if patient shows signs of decline, or if they wish to tuck into hospice services.  Violeta Gelinas NP-C [phone]

## 2019-10-25 NOTE — Progress Notes (Deleted)
Cardiology Clinic Note   Patient Name: [name] Date of Encounter: 10/25/2019  Primary Care Provider:  Carol Ada, MD Primary Cardiologist:  Glenetta Hew, MD  Patient Profile    [name] 86 year old female presents today for follow-up evaluation of her hypertension.    Hypotension and emesis prompted holding lisinopril while at rehab facility.  Resumed when she moved to [facility].  Past Medical History    Past Medical History:  Diagnosis Date  . Actinic keratosis   . Allergic rhinitis   . Aneurysm ([place])   . Arthritis    osteoarthritis, back, hands, wrists  . Basal cell carcinoma   . Bursitis    hips bilat   . CAD (coronary artery disease), autologous vein bypass graft March 2008   Follow-up cath: December 2015 Occluded SVG-D1 and occluded SVG-RI.  [name] CAD in native artery 2005, 03/2014   a.  Severe disease noted in LAD & RI --> referred for CABG x3; b.  Follow-up cath for abnormal Myoview: Occluded SVG-RI and SVG-D1 with patent LIMA-LAD and competitive flow.  60-70% LAD and RI lesions.  Otherwise minimal disease.  EF 70%.  . Cancer ([place])    + basal cell- on leg  . Cataract   . Dementia ([place])   . Dyslipidemia    Statin intolerant  . Glaucoma   . History of kidney stones   . Hypertension   . Non-STEMI (non-ST elevated myocardial infarction) [facility]) October 2005   EF by 35-40%, echo 40-50%. Angiography: 99% mid LAD involving D1 followed by 70% mid LAD; 80% RI. --> CABG  . Osteoarthritis   . PONV (postoperative nausea and vomiting)   . S/P CABG x 01 February 2004   LIMA-LAD, SVG-D1, SVG-RI.  [name] Statin intolerance   . Stroke [facility])    Past Surgical History:  Procedure Laterality Date  . BUNIONECTOMY     2002  . CARDIOVASCULAR STRESS TEST  05/23/2006   Mild lateral/inferolateral ischemia, likely due to occluded SVGs with exhisting disease.  [name] CAROTID DOPPLER  07/15/2009   Bilat ICAs 0-49% diameter reduction. Normal patency of Bilat subclavian  arteries.  . CORONARY ARTERY BYPASS GRAFT  02/22/2004   x3. LIMA to distal LAD, SVG to first diag, SVG to ramus. SVG harvest from rt thigh.  . EP IMPLANTABLE DEVICE N/A 03/02/2016   Procedure: Loop Recorder Insertion;  Surgeon: Thompson Grayer, MD;  Location: [facility];  Service: Cardiovascular;  Laterality: N/A;  . EXCISION/RELEASE BURSA HIP Right 06/15/2015   Procedure: RIGHT HIP BURSECTOMY WITH GLUTEAL TENDON REPAIR;  Surgeon: Gaynelle Arabian, MD;  Location: [facility];  Service: Orthopedics;  Laterality: Right;  . EYE SURGERY     cataract surgery bilat   . IR ANGIO INTRA EXTRACRAN SEL COM CAROTID INNOMINATE BILAT MOD SED  08/07/2016  . IR ANGIO VERTEBRAL SEL SUBCLAVIAN INNOMINATE BILAT MOD SED  08/07/2016  . IR GENERIC HISTORICAL  06/01/2016   IR RADIOLOGIST EVAL & MGMT 06/01/2016 [facility]  . IR RADIOLOGIST EVAL & MGMT  05/07/2017  . LEFT HEART CATHETERIZATION WITH CORONARY ANGIOGRAM  02/19/2004   Significant 2 vessel CAD - LAD, D1 and RI  . LEFT HEART CATHETERIZATION WITH CORONARY/GRAFT ANGIOGRAM N/A 04/28/2014   Procedure: LEFT HEART CATHETERIZATION WITH Beatrix Fetters;  Surgeon: Sinclair Grooms, MD;  Location: [facility] CATH LAB: CTO of SVG-Diag & SVG-RI, patent LIMA-LAD. Patent native circumflex, LAD and RCA. 70% stenosis in a branch of RI. --> Does not explain "high risk  perfusion study "  . LEFT HEART CATHETERIZATION WITH CORONARY/GRAFT ANGIOGRAM   07/25/2006   Totally occluded SVG to diag and ramus. Patent LIMA-LAD. Ramus 70-80% proximal stenosis and occluded vein graft.  [name] left total knee replacement      2001  . NM MYOVIEW LTD  January 2008; December 2015   a. Referred for For mild inferolateral and anterolateral/apical lateral defect with mild reversibility.;; b. Large defect in the anterior and inferior wall. Suggestive of potential infarct plus ischemia. HIGH RISK.  [name] RADIOLOGY WITH ANESTHESIA N/A 08/07/2016   Procedure: EMBOLIZATION;  Surgeon: Luanne Bras, MD;  Location: [facility];  Service: Radiology;  Laterality: N/A;  . right total knee replacement      2003  . TRANSTHORACIC ECHOCARDIOGRAM  02/19/2004; December 2015   a. EF 45-50%, Normal LV function, moderate hypokinesis of anterior wall.;; b. EF 50-55%. No RWMA, GR 1 DD. Normal valves     Allergies  Allergies  Allergen Reactions  . Nsaids Other (See Comments)    URINARY RETENTION  . Statins Other (See Comments)    MYALGIAS HURTS ALL OVER  . Tricor [Fenofibrate] Other (See Comments)    MYALGIAS HURTS ALL OVER  . Zetia [Ezetimibe] Other (See Comments)    MYALGIAS HURTS ALL OVER  . Lescol [Fluvastatin]     Muscle pain  . Livalo [Pitavastatin]     Muscle pain  . Niaspan [Niacin]     Severe flushing   . Relafen [Nabumetone] Rash    History of Present Illness    [name] has a PMH of coronary artery disease, CABG x 01 February 2004, labile hypertension with significant orthostatic hypotension, cryptogenic stroke/TIA, and dementia.  She was seen in follow-up 11/18 and was noted to be a poor historian.  She was instructed to switch to carvedilol during her hospital discharge however, she had stayed on her atenolol.  Her atenolol was at a reduced dose.  She was last seen by Dr. Ellyn Hack on 05/09/2019.  During that time she was doing well.  She denied chest tightness or pressure.  She was noted to have labile blood pressures.  She was substantially deconditioned and noted to have poor balance.  However she was reluctant to use a walker.  She agreed to use a cane off and on.  On 08/01/2019 patient had a bout of hypotension and emesis.  Lisinopril was stopped at that time.  She presented to the clinic 08/06/2019 for follow-up evaluation with her granddaughter who is her POA and stated she felt well.  She had an episode of vomiting and hypotension while at [facility] rehab facility.  Her lisinopril was held at that time.  She then moved to [facility] and her lisinopril was restarted.  She  had no further issues  with the medication.  Her blood pressures have been ranging in the 308 systolic.  She planned to move in with her granddaughter in 2 weeks and I  instructed her to continue the current medication parameters and regimen.  I planned follow-up with Dr. Ellyn Hack in 3 months.  She suffered a fall on 09/01/2019 and was taken to the emergency department.  She was then admitted for colitis on 09/19/2019.  She had a palliative care consult on 10/16/2019.  Reviewed goals of care including DNR.  Now dealing with 2 pressure injuries on her left hip.  Following up with palliative care on a as needed basis.  She presents to the clinic today and states***  Today she denies  chest pain, shortness of breath, lower extremity edema, fatigue, palpitations, melena, hematuria, hemoptysis, diaphoresis, weakness, presyncope, syncope, orthopnea, and PND.  Home Medications    Prior to Admission medications   Medication Sig Start Date End Date Taking? Authorizing Provider  acetaminophen (TYLENOL) 500 MG tablet Take 500 mg by mouth every 6 (six) hours as needed. 1-2 tid; no more than 3000mg /day    Julianne Handler, NP  aspirin EC 81 MG tablet Take 81 mg by mouth at bedtime.     [provider]  atenolol (TENORMIN) 25 MG tablet Take 1 tablet (25 mg total) daily by mouth. May take an additional 25 mg of medication daily, if SBP> 160 or DBP >100 Patient taking differently: Take 25 mg by mouth at bedtime. May take an additional 25 mg of medication daily, if SBP> 160 or DBP >100 03/14/17   Leonie Man, MD  brimonidine (ALPHAGAN P) 0.1 % SOLN Place 1 drop into the left eye in the morning and at bedtime.    [provider]  Cholecalciferol (VITAMIN D3) 50 MCG (2000 UT) capsule Take 1 capsule (2,000 Units total) by mouth at bedtime. Patient not taking: Reported on 10/16/2019 04/09/19   Leonie Man, MD  clopidogrel (PLAVIX) 75 MG tablet Take 1 tablet (75 mg total) by mouth daily. 05/26/19   Leonie Man, MD    donepezil (ARICEPT) 10 MG tablet Take 1 tablet (10 mg total) by mouth at bedtime. Patient not taking: Reported on 10/16/2019 08/21/19   Lomax, Amy, NP  dorzolamide-timolol (COSOPT) 22.3-6.8 MG/ML ophthalmic solution Place 1 drop into the left eye 2 (two) times daily.  11/14/17   [provider]  latanoprost (XALATAN) 0.005 % ophthalmic solution Place 1 drop into both eyes at bedtime. 06/26/16   [provider]  lisinopril (PRINIVIL,ZESTRIL) 20 MG tablet Take 20 mg by mouth at bedtime.     [provider]  memantine (NAMENDA) 5 MG tablet Take 1 tablet (5 mg total) by mouth 2 (two) times daily. Patient not taking: Reported on 10/16/2019 08/21/19   Lomax, Amy, NP  mirtazapine (REMERON) 7.5 MG tablet Take 7.5 mg by mouth at bedtime as needed (sleep/anxiety).  Patient not taking: Reported on 10/16/2019 09/08/19   [provider]    Family History    Family History  Problem Relation Age of Onset  . Alzheimer's disease Mother   . Alzheimer's disease Sister   . Hyperlipidemia Sister   . Hypertension Sister   . Diabetes Sister   . Hyperlipidemia Sister   . Hypertension Sister   . Pancreatitis Child    She indicated that her mother is deceased. She indicated that her father is deceased. She indicated that only one of her four sisters is alive. She indicated that the status of her child is unknown.  Social History    Social History   Socioeconomic History  . Marital status: Widowed    Spouse name: Not on file  . Number of children: 2  . Years of education: 16+  . Highest education level: Not on file  Occupational History  . Occupation: retired  Tobacco Use  . Smoking status: Never Smoker  . Smokeless tobacco: Never Used  Vaping Use  . Vaping Use: Never used  Substance and Sexual Activity  . Alcohol use: Not Currently    Alcohol/week: 1.0 standard drink    Types: 1 Standard drinks or equivalent per week    Comment: 1 glass of liquor weekly   .  Drug  use: No  . Sexual activity: Not on file  Other Topics Concern  . Not on file  Social History Narrative   She is a widowed, mother of 2 grandmother of 3.    Lives with granddaughter -[name]   Drinks coffee every morning. 1 cup.   She used to do exercising through the [employer] program as well as the [employer]. Unfortunately, this finding of the classes for seniors changed, and she was unable to make the new classes. She did not like exercising with non-seniors. Besides that she does existing disease, walks up and down the stairs, walk her dog. She is not as active as he had been before.   She never smoked, never drank alcohol.      Social Determinants of Health   Financial Resource Strain:   . Difficulty of Paying Living Expenses:   Food Insecurity:   . Worried About Charity fundraiser in the Last Year:   . Arboriculturist in the Last Year:   Transportation Needs:   . Film/video editor (Medical):   [name] Lack of Transportation (Non-Medical):   Physical Activity:   . Days of Exercise per Week:   . Minutes of Exercise per Session:   Stress:   . Feeling of Stress :   Social Connections:   . Frequency of Communication with Friends and Family:   . Frequency of Social Gatherings with Friends and Family:   . Attends Religious Services:   . Active Member of Clubs or Organizations:   . Attends Archivist Meetings:   [name] Marital Status:   Intimate Partner Violence:   . Fear of Current or Ex-Partner:   . Emotionally Abused:   [name] Physically Abused:   . Sexually Abused:      Review of Systems    General:  No chills, fever, night sweats or weight changes.  Cardiovascular:  No chest pain, dyspnea on exertion, edema, orthopnea, palpitations, paroxysmal nocturnal dyspnea. Dermatological: No rash, lesions/masses Respiratory: No cough, dyspnea Urologic: No hematuria, dysuria Abdominal:   No nausea, vomiting, diarrhea, bright red blood per rectum, melena, or  hematemesis Neurologic:  No visual changes, wkns, changes in mental status. All other systems reviewed and are otherwise negative except as noted above.  Physical Exam    VS:  There were no vitals taken for this visit. , BMI There is no height or weight on file to calculate BMI. GEN: Well nourished, well developed, in no acute distress. HEENT: normal. Neck: Supple, no JVD, carotid bruits, or masses. Cardiac: RRR, no murmurs, rubs, or gallops. No clubbing, cyanosis, edema.  Radials/DP/PT 2+ and equal bilaterally.  Respiratory:  Respirations regular and unlabored, clear to auscultation bilaterally. GI: Soft, nontender, nondistended, BS + x 4. MS: no deformity or atrophy. Skin: warm and dry, no rash. Neuro:  Strength and sensation are intact. Psych: Normal affect.  Accessory Clinical Findings    ECG personally reviewed by me today- *** - No acute changes  EKG 07/16/2019 Sinus rhythm with PVCs 69 bpm  Echocardiogram 01/22/2016 Study Conclusions   - Left ventricle: The cavity size was normal. There was mild  concentric hypertrophy. Systolic function was normal. The  estimated ejection fraction was 55%. Wall motion was normal;  there were no regional wall motion abnormalities. Features are  consistent with a pseudonormal left ventricular filling pattern,  with concomitant abnormal relaxation and increased filling  pressure (grade 2 diastolic dysfunction).  - Mitral valve: There was mild  regurgitation.  - Tricuspid valve: There was trivial regurgitation.  Nuclear stress test 05/16/2013 Impression Exercise Capacity: Fair exercise capacity. BP Response: Normal blood pressure response. Clinical Symptoms: There is dyspnea. ECG Impression: Significant ST abnormalities consistent with ischemia. Comparison with Prior Nuclear Study: No images to compare  Overall Impression: Low risk stress nuclear study with normal perfusion. Fair exercise effort, however, there were  2-3 mm horizontal ST depression inferiorly at peak exercise. Clinical correlation is advised. This may be an electrically false positive study.  Assessment & Plan   1.   Essential hypertension-BP today 146/70***.  Labile at home.  Has a long history of labile hypertension with systolic pressures ranging from 120s to 180s.  Previously her blood pressure medications were both taken at dinnertime.    Now working with palliative care due to patient decline. Continue lisinopril 20 mg at bedtime Continue atenolol 25 mg daily Keep blood pressure log, checking at intermittent times May take an additional dose of atenolol for a systolic blood pressure greater than 180 mmHg Regular unrestricted diet Lower extremity support stockings Increase physical activity as tolerated  Coronary artery disease-autologous vein bypass graft.  CABG times 01 February 2004.  2 vein grafts occluded.  Patent LAD.  Given her advanced age and dementia recommendation was made for no further stress testing. Continue atenolol Continue 81 mg aspirin daily Statin therapy not recommended due to age and dementia  Non-STEMI-continues with known chest pain today.  No cardiac complaints Continue to monitor  Hyperlipidemia-LDL 158 10/10/2016 Previously discussed not having any lipid-lowering agents due to age and dementia.  Memory loss-her dementia continues to progress.  Her granddaughter is her POA.  Pressure injury-2 pressure injuries on her left hip.  Receiving assistance from wound care.  Patient unable to turn herself in bed at this time.  Working with physical therapy.  Disposition: Follow-up with Dr. Ellyn Hack in 3 months.  [name]. Dalyah Pla NP-C    10/25/2019, 9:29 AM [place] Office [phone] Fax [phone]

## 2019-10-28 ENCOUNTER — Ambulatory Visit: Payer: Medicare Other | Admitting: General Practice

## 2019-11-10 DIAGNOSIS — F0390 Unspecified dementia without behavioral disturbance: Secondary | ICD-10-CM | POA: Diagnosis not present

## 2019-11-10 DIAGNOSIS — Z9181 History of falling: Secondary | ICD-10-CM | POA: Diagnosis not present

## 2019-11-10 DIAGNOSIS — L89153 Pressure ulcer of sacral region, stage 3: Secondary | ICD-10-CM | POA: Diagnosis not present

## 2019-11-10 DIAGNOSIS — I10 Essential (primary) hypertension: Secondary | ICD-10-CM | POA: Diagnosis not present

## 2019-11-10 DIAGNOSIS — R1312 Dysphagia, oropharyngeal phase: Secondary | ICD-10-CM | POA: Diagnosis not present

## 2019-11-10 DIAGNOSIS — R41841 Cognitive communication deficit: Secondary | ICD-10-CM | POA: Diagnosis not present

## 2019-11-10 DIAGNOSIS — R4701 Aphasia: Secondary | ICD-10-CM | POA: Diagnosis not present

## 2019-11-11 ENCOUNTER — Other Ambulatory Visit (HOSPITAL_COMMUNITY)
Admission: RE | Admit: 2019-11-11 | Discharge: 2019-11-11 | Disposition: A | Discharge status: Home / Self Care | Payer: Medicare Other | Source: Ambulatory Visit

## 2019-11-11 ENCOUNTER — Encounter (HOSPITAL_BASED_OUTPATIENT_CLINIC_OR_DEPARTMENT_OTHER): Payer: Medicare Other | Admitting: Internal Medicine

## 2019-11-11 ENCOUNTER — Other Ambulatory Visit (HOSPITAL_COMMUNITY): Payer: Self-pay | Admitting: Internal Medicine

## 2019-11-11 ENCOUNTER — Other Ambulatory Visit: Payer: Self-pay

## 2019-11-11 ENCOUNTER — Ambulatory Visit (HOSPITAL_COMMUNITY)
Admission: RE | Admit: 2019-11-11 | Discharge: 2019-11-11 | Disposition: A | Discharge status: Home / Self Care | Payer: Medicare Other | Source: Ambulatory Visit | Attending: Internal Medicine | Admitting: Internal Medicine

## 2019-11-11 ENCOUNTER — Encounter (HOSPITAL_BASED_OUTPATIENT_CLINIC_OR_DEPARTMENT_OTHER): Payer: Medicare Other | Attending: Internal Medicine | Admitting: Internal Medicine

## 2019-11-11 DIAGNOSIS — M47816 Spondylosis without myelopathy or radiculopathy, lumbar region: Secondary | ICD-10-CM | POA: Diagnosis not present

## 2019-11-11 DIAGNOSIS — L89154 Pressure ulcer of sacral region, stage 4: Secondary | ICD-10-CM

## 2019-11-11 DIAGNOSIS — I252 Old myocardial infarction: Secondary | ICD-10-CM | POA: Diagnosis not present

## 2019-11-11 DIAGNOSIS — L89223 Pressure ulcer of left hip, stage 3: Secondary | ICD-10-CM | POA: Diagnosis not present

## 2019-11-11 DIAGNOSIS — M533 Sacrococcygeal disorders, not elsewhere classified: Secondary | ICD-10-CM | POA: Diagnosis not present

## 2019-11-11 DIAGNOSIS — L89210 Pressure ulcer of right hip, unstageable: Secondary | ICD-10-CM | POA: Insufficient documentation

## 2019-11-11 DIAGNOSIS — M810 Age-related osteoporosis without current pathological fracture: Secondary | ICD-10-CM | POA: Diagnosis not present

## 2019-11-11 DIAGNOSIS — I10 Essential (primary) hypertension: Secondary | ICD-10-CM | POA: Diagnosis not present

## 2019-11-11 DIAGNOSIS — Z96653 Presence of artificial knee joint, bilateral: Secondary | ICD-10-CM | POA: Diagnosis not present

## 2019-11-11 DIAGNOSIS — I2510 Atherosclerotic heart disease of native coronary artery without angina pectoris: Secondary | ICD-10-CM | POA: Diagnosis not present

## 2019-11-11 DIAGNOSIS — F0150 Vascular dementia without behavioral disturbance: Secondary | ICD-10-CM | POA: Insufficient documentation

## 2019-11-11 DIAGNOSIS — L89322 Pressure ulcer of left buttock, stage 2: Secondary | ICD-10-CM | POA: Insufficient documentation

## 2019-11-11 DIAGNOSIS — Z951 Presence of aortocoronary bypass graft: Secondary | ICD-10-CM | POA: Diagnosis not present

## 2019-11-11 DIAGNOSIS — M160 Bilateral primary osteoarthritis of hip: Secondary | ICD-10-CM | POA: Diagnosis not present

## 2019-11-11 DIAGNOSIS — L89159 Pressure ulcer of sacral region, unspecified stage: Secondary | ICD-10-CM | POA: Diagnosis not present

## 2019-11-11 DIAGNOSIS — M1611 Unilateral primary osteoarthritis, right hip: Secondary | ICD-10-CM | POA: Diagnosis not present

## 2019-11-11 LAB — CBC WITH DIFFERENTIAL/PLATELET
Abs Immature Granulocytes: 0.09 10*3/uL — ABNORMAL HIGH (ref 0.00–0.07)
Basophils Absolute: 0 10*3/uL (ref 0.0–0.1)
Basophils Relative: 0 %
Eosinophils Absolute: 0 10*3/uL (ref 0.0–0.5)
Eosinophils Relative: 0 %
HCT: 32.7 % — ABNORMAL LOW (ref 36.0–46.0)
Hemoglobin: 10.1 g/dL — ABNORMAL LOW (ref 12.0–15.0)
Immature Granulocytes: 1 %
Lymphocytes Relative: 18 %
Lymphs Abs: 2.6 10*3/uL (ref 0.7–4.0)
MCH: 29.6 pg (ref 26.0–34.0)
MCHC: 30.9 g/dL (ref 30.0–36.0)
MCV: 95.9 fL (ref 80.0–100.0)
Monocytes Absolute: 0.8 10*3/uL (ref 0.1–1.0)
Monocytes Relative: 6 %
Neutro Abs: 10.7 10*3/uL — ABNORMAL HIGH (ref 1.7–7.7)
Neutrophils Relative %: 75 %
Platelets: 589 10*3/uL — ABNORMAL HIGH (ref 150–400)
RBC: 3.41 MIL/uL — ABNORMAL LOW (ref 3.87–5.11)
RDW: 15.5 % (ref 11.5–15.5)
WBC: 14.2 10*3/uL — ABNORMAL HIGH (ref 4.0–10.5)
nRBC: 0 % (ref 0.0–0.2)

## 2019-11-11 LAB — COMPREHENSIVE METABOLIC PANEL
ALT: 14 U/L (ref 0–44)
AST: 20 U/L (ref 15–41)
Albumin: 2.5 g/dL — ABNORMAL LOW (ref 3.5–5.0)
Alkaline Phosphatase: 145 U/L — ABNORMAL HIGH (ref 38–126)
Anion gap: 15 (ref 5–15)
BUN: 20 mg/dL (ref 8–23)
CO2: 23 mmol/L (ref 22–32)
Calcium: 9.1 mg/dL (ref 8.9–10.3)
Chloride: 100 mmol/L (ref 98–111)
Creatinine, Ser: 0.51 mg/dL (ref 0.44–1.00)
GFR calc Af Amer: >60 mL/min (ref >60)
GFR calc non Af Amer: >60 mL/min (ref >60)
Glucose, Bld: 129 mg/dL — ABNORMAL HIGH (ref 70–99)
Potassium: 4.1 mmol/L (ref 3.5–5.1)
Sodium: 138 mmol/L (ref 135–145)
Total Bilirubin: 0.8 mg/dL (ref 0.3–1.2)
Total Protein: 6.3 g/dL — ABNORMAL LOW (ref 6.5–8.1)

## 2019-11-11 LAB — SEDIMENTATION RATE: Sed Rate: 98 mm/hr — ABNORMAL HIGH (ref 0–22)

## 2019-11-11 LAB — C-REACTIVE PROTEIN: CRP: 10.1 mg/dL — ABNORMAL HIGH (ref <1.0)

## 2019-11-11 NOTE — Progress Notes (Addendum)
Erica, Cummings ([id number]) Visit Report for 11/11/2019 Chief Complaint Document Details Patient Name: Date of Service: [name], [name] 11/11/2019 10:30 A M Medical Record Number: [id number] Patient Account Number: [id number] Date of Birth/Sex: Treating RN: 17-Sep-1931 (86 y.o. F) Primary Care Provider: Carol Ada Other Clinician: Referring Provider: Treating Provider/Extender: Stefanie Libel, Lenor Derrick in Treatment: 0 Information Obtained from: Patient Chief Complaint 11/11/2019; patient is here for review of multiple pressure ulcers in the setting of advanced vascular dementia Electronic Signature(s) Signed: 11/11/2019 5:16:39 PM By: Linton Ham MD Entered By: Linton Ham on 11/11/2019 13:32:41 -------------------------------------------------------------------------------- Debridement Details Patient Name: Date of Service: Erica Douglas A. 11/11/2019 10:30 A M Medical Record Number: [id number] Patient Account Number: [id number] Date of Birth/Sex: Treating RN: October 06, 1931 (86 y.o. F) Primary Care Provider: Carol Ada Other Clinician: Referring Provider: Treating Provider/Extender: Stefanie Libel, Lenor Derrick in Treatment: 0 Debridement Performed for Assessment: Wound #4 Right Trochanter Performed By: Physician Ricard Dillon., MD Debridement Type: Debridement Level of Consciousness (Pre-procedure): Awake and Alert Pre-procedure Verification/Time Out Yes - 12:35 Taken: Start Time: 12:35 Pain Control: Other : lidocaine 20% T Area Debrided (L x W): otal 3.2 (cm) x 3.3 (cm) = 10.56 (cm) Tissue and other material debrided: Viable, Non-Viable, Eschar, Slough, Subcutaneous, Skin: Dermis , Skin: Epidermis, Slough Level: Skin/Subcutaneous Tissue Debridement Description: Excisional Instrument: Curette Bleeding: Moderate Hemostasis Achieved: Pressure End Time: 12:40 Procedural Pain: 5 Post Procedural Pain: 1 Response to Treatment:  Procedure was tolerated well Level of Consciousness (Post- Awake and Alert procedure): Post Debridement Measurements of Total Wound Length: (cm) 3.2 Stage: Unstageable/Unclassified Width: (cm) 3.3 Depth: (cm) 0.1 Volume: (cm) 0.829 Character of Wound/Ulcer Post Debridement: Improved Post Procedure Diagnosis Same as Pre-procedure Electronic Signature(s) Signed: 11/11/2019 5:16:39 PM By: Linton Ham MD Entered By: Linton Ham on 11/11/2019 13:32:24 -------------------------------------------------------------------------------- HPI Details Patient Name: Date of Service: Erica Douglas A. 11/11/2019 10:30 A M Medical Record Number: [id number] Patient Account Number: [id number] Date of Birth/Sex: Treating RN: [birth year] (86 y.o. F) Primary Care Provider: Carol Ada Other Clinician: Referring Provider: Treating Provider/Extender: Stefanie Libel, Lenor Derrick in Treatment: 0 History of Present Illness HPI Description: ADMISSION 11/11/2019 This is an 86 year old woman who is accompanied by her granddaughter and one of her nurses aides. She is a very frail woman with baseline vascular dementia. Nevertheless she was up until a hospitalization in May able to walk around in the house and feed herself. She was hospitalized from 5/21 through 6/2. She was found to have acute mental status change. She was hypotensive in arrival. On CT scan of her abdomen noted to have thickening of the sigmoid and rectal area and her diagnosis was sepsis from presumed colitis. Her blood cultures ultimately showed staph hominis and she received a 10-day course of vancomycin. It was notable that during the hospitalization I could find reference to a pressure ulcer of the skin but not exactly where. According to her granddaughter this was on her sacrum but it is dramatically worsening since the discharge. She comes in today with stage IV ulcer on her sacrum, an unstageable area on the right  greater trochanter is stage III on the left greater trochanter and a stage II wound on the left gluteus. Her albumin was 2.3 in the hospital on 5/25 although they are doing their best to feed her and giving her nutritional supplements including boost now. She is getting a level 3 pressure relief mattress surface and that is coming soon provided  with assistance of their primary physician at [facility] Past medical history includes vascular dementia, hypertension, coronary artery disease. She has 24/7 care with a nurses aide and home health through [facility] Electronic Signature(s) Signed: 11/11/2019 5:16:39 PM By: Linton Ham MD Entered By: Linton Ham on 11/11/2019 13:35:01 -------------------------------------------------------------------------------- Physical Exam Details Patient Name: Date of Service: Erica Douglas A. 11/11/2019 10:30 A M Medical Record Number: [id number] Patient Account Number: [id number] Date of Birth/Sex: Treating RN: April 23, 1932 (86 y.o. F) Primary Care Provider: Carol Ada Other Clinician: Referring Provider: Treating Provider/Extender: Stefanie Libel, Candace Weeks in Treatment: 0 Constitutional Sitting or standing Blood Pressure is within target range for patient.. Pulse regular and within target range for patient.[name] Respirations regular, non-labored and within target range.. Temperature is normal and within the target range for the patient.[name] Appears in no distress however very frail and noncommunicative. Respiratory work of breathing is normal. Bilateral breath sounds are clear and equal in all lobes with no wheezes, rales or rhonchi.. Cardiovascular Heart rhythm and rate regular, without murmur or gallop. Not grossly dehydrated. Gastrointestinal (GI) Abdomen is soft and non-distended without masses or tenderness.. No liver or spleen enlargement. Integumentary (Hair, Skin) No systemic cutaneous issues are seen. Notes Wound  exam Stage IV wound over the sacrum there is significant undermining and palpable bone in some areas. Right greater trochanter this was unstageable with thick adherent debris. I used a #5 curette to remove some of this but I still do not think I got to a healthy surface On the left trochanter clean stage III wounds x2 these look linear -Left glued small stage II wound. No erythema around any wound surface Electronic Signature(s) Signed: 11/11/2019 5:16:39 PM By: Linton Ham MD Entered By: Linton Ham on 11/11/2019 13:36:31 -------------------------------------------------------------------------------- Physician Orders Details Patient Name: Date of Service: Erica Douglas A. 11/11/2019 10:30 A M Medical Record Number: [id number] Patient Account Number: [id number] Date of Birth/Sex: Treating RN: 1931-11-15 (86 y.o. Orvan Falconer Primary Care Provider: Carol Ada Other Clinician: Referring Provider: Treating Provider/Extender: Lona Millard in Treatment: 0 Verbal / Phone Orders: No Diagnosis Coding Follow-up Appointments Return Appointment in 2 weeks. Dressing Change Frequency Wound #1 Left Trochanter Change dressing three times week. Wound #2 Sacrum Change dressing three times week. Wound #3 Left Gluteus Change dressing three times week. Wound #4 Right Trochanter Change dressing three times week. Skin Barriers/Peri-Wound Care Wound #1 Left Trochanter Barrier cream Wound #2 Sacrum Barrier cream Wound #3 Left Gluteus Barrier cream Wound #4 Right Trochanter Barrier cream Wound Cleansing Wound #1 Left Trochanter Clean wound with Normal Saline. Wound #2 Sacrum Clean wound with Normal Saline. Wound #3 Left Gluteus Clean wound with Normal Saline. Wound #4 Right Trochanter Clean wound with Normal Saline. Primary Wound Dressing Wound #1 Left Trochanter Calcium Alginate with Silver Wound #2 Sacrum Calcium Alginate with Silver Wound #3  Left Gluteus Calcium Alginate with Silver Wound #4 Right Trochanter Calcium Alginate with Silver Secondary Dressing Wound #1 Left Trochanter Dry Gauze ABD pad - secure with tape Wound #2 Sacrum Dry Gauze ABD pad - secure with tape Wound #3 Left Gluteus Dry Gauze ABD pad - secure with tape Wound #4 Right Trochanter Dry Gauze ABD pad - secure with tape Home Health Wound #1 Left [place] skilled nursing for wound care. Wound #2 [place] skilled nursing for wound care. Wound #3 Left [place] skilled nursing for wound care. Wound #4 Right  [place] skilled nursing for wound care. Laboratory Comprehensive 2000 panel in Serum or Plasma (CHEM-panel) LOINC Code: [phone] Convenience Name: Comprehensive metabolic panel=CMS CBC W A Differential panel in Blood (HEM-CBC) uto LOINC Code: [id number] Convenience Name: CBC W Auto Differential panel Erythrocyte sedimentation rate (HEM) LOINC Code: 09381-8 Convenience Name: Sed rate-method unspecified C reactive protein [Mass/volume] in Serum or Plasma (CHEM) LOINC Code: 1988-5 Convenience Name: C Reactive Protein in serum or plasma Radiology X-ray, other sacrum - non healing wound X-ray, other right trocanter Electronic Signature(s) Signed: 11/11/2019 5:16:39 PM By: Linton Ham MD Signed: 11/11/2019 5:57:23 PM By: Carlene Coria RN Entered By: Carlene Coria on 11/11/2019 12:50:17 Prescription 11/11/2019 -------------------------------------------------------------------------------- Tereasa Coop MD Patient Name: Provider: May 24, 1931 [id number] Date of Birth: NPI#Jesse Sans Sex: DEA #: [phone] Phone #: License #: [place] Patient Address: [address] [place], [place] [address] [place], [place] [address] [phone] Allergies Name  Reaction Severity Tricor muscle pain Zetia peripheral edema Statins-Hmg-Coa Reductase Inhibitors muscle pain Niaspan Extended-Release severe flushing Relafen rash NSAIDS (Non-Steroidal Anti-Inflammatory Drug) urinary retention Livalo muscle pain Provider's Orders X-ray, other sacrum - non healing wound Hand Signature: Date(s): Prescription 11/11/2019 Tereasa Coop MD Patient Name: Provider: [birth year] [id number] Date of Birth: NPI#Wanda Plump [id number] Sex: DEA #: [phone] Phone #: License #: [place] Patient Address: [address], [place] [address] [place], [place] [address] [phone] Allergies Name Reaction Severity Tricor muscle pain Zetia peripheral edema Statins-Hmg-Coa Reductase Inhibitors muscle pain Niaspan Extended-Release severe flushing Relafen rash NSAIDS (Non-Steroidal Anti-Inflammatory Drug) urinary retention Livalo muscle pain Provider's Orders X-ray, other right trocanter Hand Signature: Date(s): Prescription 11/11/2019 Tereasa Coop MD Patient Name: Provider: [birth year] [id number] Date of Birth: NPI#Wanda Plump [id number] Sex: DEA #: [phone] Phone #: License #: [place] Patient Address: [address] [place], [place] [address] [place], [place] [address] [phone] Allergies Name Reaction Severity Tricor muscle pain Zetia peripheral edema Statins-Hmg-Coa Reductase Inhibitors muscle pain Niaspan Extended-Release severe flushing Relafen rash NSAIDS (Non-Steroidal Anti-Inflammatory Drug) urinary retention Livalo muscle pain Provider's Orders Comprehensive 2000 panel in Serum or Plasma LOINC Code: 67672-0 Convenience Name: Comprehensive metabolic panel=CMS Hand Signature: Date(s): Prescription 11/11/2019 Tereasa Coop  MD Patient Name: Provider: 1931/05/04 [id number] Date of Birth: NPI#Wanda Plump [id number] Sex: DEA #: [phone] [id number] Phone #: License #: [place] Patient Address: [address], [place] [address] [place], [place] [address] [phone] Allergies Name Reaction Severity Tricor muscle pain Zetia peripheral edema Statins-Hmg-Coa Reductase Inhibitors muscle pain Niaspan Extended-Release severe flushing Relafen rash NSAIDS (Non-Steroidal Anti-Inflammatory Drug) urinary retention Livalo muscle pain Provider's Orders CBC W A Differential panel in Blood uto LOINC Code: [id number] Convenience Name: CBC W Auto Differential panel Hand Signature: Date(s): Prescription 11/11/2019 Tereasa Coop MD Patient Name: Provider: 1931-06-24 [id number] Date of Birth: NPI#Wanda Plump [id number] Sex: DEA #: [phone] Phone #: License #: [place] Patient Address: [address], [place] [address] [place], [place] [address] [phone] Allergies Name Reaction Severity Tricor muscle pain Zetia peripheral edema Statins-Hmg-Coa Reductase Inhibitors muscle pain Niaspan Extended-Release severe flushing Relafen rash NSAIDS (Non-Steroidal  Anti-Inflammatory Drug) urinary retention Livalo muscle pain Provider's Orders Erythrocyte sedimentation rate LOINC Code: 37628-3 Convenience Name: Sed rate-method unspecified Hand Signature: Date(s): Prescription 11/11/2019 Tereasa Coop MD Patient Name: Provider: [birth year] [id number] Date of Birth: NPI#Wanda Plump [id number] Sex: DEA #: [phone] Phone #: License #: [place] Patient Address: [place], [place] [address] [place], [place]  [address] [phone] Allergies Name Reaction Severity Tricor muscle pain Zetia peripheral edema Statins-Hmg-Coa Reductase Inhibitors muscle pain Niaspan Extended-Release severe flushing Relafen rash NSAIDS (Non-Steroidal Anti-Inflammatory Drug) urinary retention Livalo muscle pain Provider's Orders C reactive protein [Mass/volume] in Serum or Plasma LOINC Code: 1988-5 Convenience Name: C Reactive Protein in serum or plasma Hand Signature: Date(s): Electronic Signature(s) Signed: 11/11/2019 5:16:39 PM By: Linton Ham MD Signed: 11/11/2019 5:57:23 PM By: Carlene Coria RN Entered By: Carlene Coria on 11/11/2019 12:50:18 -------------------------------------------------------------------------------- Problem List Details Patient Name: Date of Service: Erica Douglas A. 11/11/2019 10:30 A M Medical Record Number: [id number] Patient Account Number: [id number] Date of Birth/Sex: Treating RN: 26-Feb-1932 (86 y.o. F) Primary Care Provider: Carol Ada Other Clinician: Referring Provider: Treating Provider/Extender: Stefanie Libel, Lenor Derrick in Treatment: 0 Active Problems ICD-10 Encounter Code Description Active Date MDM Diagnosis L89.154 Pressure ulcer of sacral region, stage 4 11/11/2019 No Yes L89.210 Pressure ulcer of right hip, unstageable 11/11/2019 No Yes L89.223 Pressure ulcer of left hip, stage 3 11/11/2019 No Yes L89.322 Pressure ulcer of left buttock, stage 2 11/11/2019 No Yes F01.50 Vascular dementia without behavioral disturbance 11/11/2019 No Yes Inactive Problems Resolved Problems Electronic Signature(s) Signed: 11/11/2019 5:16:39 PM By: Linton Ham MD Entered By: Linton Ham on 11/11/2019 13:31:44 -------------------------------------------------------------------------------- Progress Note Details Patient Name: Date of Service: Erica Douglas A. 11/11/2019 10:30 A M Medical Record Number: [id number] Patient Account Number: [id number] Date  of Birth/Sex: Treating RN: 11-Apr-1932 (86 y.o. F) Primary Care Provider: Carol Ada Other Clinician: Referring Provider: Treating Provider/Extender: Stefanie Libel, Lenor Derrick in Treatment: 0 Subjective Chief Complaint Information obtained from Patient 11/11/2019; patient is here for review of multiple pressure ulcers in the setting of advanced vascular dementia History of Present Illness (HPI) ADMISSION 11/11/2019 This is an 86 year old woman who is accompanied by her granddaughter and one of her nurses aides. She is a very frail woman with baseline vascular dementia. Nevertheless she was up until a hospitalization in May able to walk around in the house and feed herself. She was hospitalized from 5/21 through 6/2. She was found to have acute mental status change. She was hypotensive in arrival. On CT scan of her abdomen noted to have thickening of the sigmoid and rectal area and her diagnosis was sepsis from presumed colitis. Her blood cultures ultimately showed staph hominis and she received a 10-day course of vancomycin. It was notable that during the hospitalization I could find reference to a pressure ulcer of the skin but not exactly where. According to her granddaughter this was on her sacrum but it is dramatically worsening since the discharge. She comes in today with stage IV ulcer on her sacrum, an unstageable area on the right greater trochanter is stage III on the left greater trochanter and a stage II wound on the left gluteus. Her albumin was 2.3 in the hospital on 5/25 although they are doing their best to feed her and giving her nutritional supplements including boost now. She is getting a level 3 pressure relief mattress surface and  that is coming soon provided with assistance of their primary physician at [facility] Past medical history includes vascular dementia, hypertension, coronary artery disease. She has 24/7 care with a nurses aide and home health  through [facility] Patient History Allergies Tricor (Reaction: muscle pain), Zetia (Reaction: peripheral edema), Statins-Hmg-Coa Reductase Inhibitors (Reaction: muscle pain), Niaspan Extended-Release (Reaction: severe flushing), Relafen (Reaction: rash), NSAIDS (Non-Steroidal Anti-Inflammatory Drug) (Reaction: urinary retention), Livalo (Reaction: muscle pain) Family History Cancer - Siblings, Diabetes - Siblings, Hypertension - Siblings, No family history of Heart Disease, Hereditary Spherocytosis, Kidney Disease, Lung Disease, Seizures, Stroke, Thyroid Problems, Tuberculosis. Social History Never smoker, Marital Status - Widowed, Alcohol Use - Rarely, Drug Use - No History, Caffeine Use - Daily - coke, diet pepsi. Medical History Eyes Patient has history of Cataracts, Glaucoma Ear/Nose/Mouth/Throat Denies history of Chronic sinus problems/congestion, Middle ear problems Cardiovascular Patient has history of Coronary Artery Disease, Hypertension, Myocardial Infarction Endocrine Denies history of Type I Diabetes, Type II Diabetes Genitourinary Denies history of End Stage Renal Disease Integumentary (Skin) Denies history of History of Burn Musculoskeletal Patient has history of Osteoarthritis Neurologic Patient has history of Dementia Oncologic Denies history of Received Chemotherapy, Received Radiation Psychiatric Denies history of Anorexia/bulimia, Confinement Anxiety Hospitalization/Surgery History - bil knee replacements. - CABG 3 vessel. - right hip bursectomy. Medical A Surgical History Notes nd Ear/Nose/Mouth/Throat hard of hearing, loss of smell, dysphagia Cardiovascular dyslipidemia Genitourinary incontinence Musculoskeletal bilat hip busitis Neurologic CVA, TIAs Oncologic skin CA Review of Systems (ROS) Constitutional Symptoms (General Health) Denies complaints or symptoms of Fatigue, Fever, Chills, Marked Weight  Change. Ear/Nose/Mouth/Throat Denies complaints or symptoms of Chronic sinus problems or rhinitis. Respiratory Denies complaints or symptoms of Chronic or frequent coughs, Shortness of Breath. Cardiovascular Denies complaints or symptoms of Chest pain. Gastrointestinal Denies complaints or symptoms of Frequent diarrhea, Nausea, Vomiting. Endocrine Denies complaints or symptoms of Heat/cold intolerance. Genitourinary Denies complaints or symptoms of Frequent urination. Integumentary (Skin) Complains or has symptoms of Wounds - sacrum and iliac. Musculoskeletal Complains or has symptoms of Muscle Weakness. Neurologic Denies complaints or symptoms of Numbness/parasthesias. Psychiatric Denies complaints or symptoms of Claustrophobia, Suicidal. Objective Constitutional Sitting or standing Blood Pressure is within target range for patient.. Pulse regular and within target range for patient.[name] Respirations regular, non-labored and within target range.. Temperature is normal and within the target range for the patient.[name] Appears in no distress however very frail and noncommunicative. Vitals Time Taken: 10:51 AM, Height: 67 in, Source: Stated, Temperature: 97.9 F, Pulse: 67 bpm, Respiratory Rate: 18 breaths/min, Blood Pressure: 109/59 mmHg. Respiratory work of breathing is normal. Bilateral breath sounds are clear and equal in all lobes with no wheezes, rales or rhonchi.. Cardiovascular Heart rhythm and rate regular, without murmur or gallop. Not grossly dehydrated. Gastrointestinal (GI) Abdomen is soft and non-distended without masses or tenderness.. No liver or spleen enlargement. General Notes: Wound exam ooStage IV wound over the sacrum there is significant undermining and palpable bone in some areas. ooRight greater trochanter this was unstageable with thick adherent debris. I used a #5 curette to remove some of this but I still do not think I got to a healthy surface ooOn the left  trochanter clean stage III wounds x2 these look linear -Left glued small stage II wound. No erythema around any wound surface Integumentary (Hair, Skin) No systemic cutaneous issues are seen. Wound #1 status is Open. Original cause of wound was Pressure Injury. The wound is located on the Left Trochanter. The wound measures 4cm  length x 2.4cm width x 0.1cm depth; 7.54cm^2 area and 0.754cm^3 volume. There is Fat Layer (Subcutaneous Tissue) Exposed exposed. There is no tunneling or undermining noted. There is a small amount of serous drainage noted. The wound margin is flat and intact. There is no granulation within the wound bed. There is a large (67- 100%) amount of necrotic tissue within the wound bed including Adherent Slough. Wound #2 status is Open. Original cause of wound was Pressure Injury. The wound is located on the Sacrum. The wound measures 4.8cm length x 2cm width x 2.5cm depth; 7.54cm^2 area and 18.85cm^3 volume. There is bone, muscle, and Fat Layer (Subcutaneous Tissue) Exposed exposed. There is no tunneling noted, however, there is undermining starting at 12:00 and ending at 12:00 with a maximum distance of 4.3cm. There is a large amount of serosanguineous drainage noted. The wound margin is well defined and not attached to the wound base. There is large (67-100%) red granulation within the wound bed. There is a small (1-33%) amount of necrotic tissue within the wound bed including Adherent Slough. Wound #3 status is Open. Original cause of wound was Pressure Injury. The wound is located on the Left Gluteus. The wound measures 0.8cm length x 0.4cm width x 0.1cm depth; 0.251cm^2 area and 0.025cm^3 volume. The wound is limited to skin breakdown. There is no tunneling or undermining noted. There is a small amount of serous drainage noted. The wound margin is flat and intact. There is no granulation within the wound bed. There is a large (67-100%) amount of necrotic tissue within the wound  bed including Adherent Slough. Wound #4 status is Open. Original cause of wound was Pressure Injury. The wound is located on the Right Trochanter. The wound measures 3.2cm length x 3.3cm width x 0.1cm depth; 8.294cm^2 area and 0.829cm^3 volume. There is Fat Layer (Subcutaneous Tissue) Exposed exposed. There is no tunneling or undermining noted. There is a medium amount of serous drainage noted. The wound margin is flat and intact. There is small (1-33%) pink granulation within the wound bed. There is a large (67-100%) amount of necrotic tissue within the wound bed including Eschar and Adherent Slough. Assessment Active Problems ICD-10 Pressure ulcer of sacral region, stage 4 Pressure ulcer of right hip, unstageable Pressure ulcer of left hip, stage 3 Pressure ulcer of left buttock, stage 2 Vascular dementia without behavioral disturbance Procedures Wound #4 Pre-procedure diagnosis of Wound #4 is a Pressure Ulcer located on the Right Trochanter . There was a Excisional Skin/Subcutaneous Tissue Debridement with a total area of 10.56 sq cm performed by Ricard Dillon., MD. With the following instrument(s): Curette to remove Viable and Non-Viable tissue/material. Material removed includes Eschar, Subcutaneous Tissue, Slough, Skin: Dermis, and Skin: Epidermis after achieving pain control using Other (lidocaine 20%). No specimens were taken. A time out was conducted at 12:35, prior to the start of the procedure. A Moderate amount of bleeding was controlled with Pressure. The procedure was tolerated well with a pain level of 5 throughout and a pain level of 1 following the procedure. Post Debridement Measurements: 3.2cm length x 3.3cm width x 0.1cm depth; 0.829cm^3 volume. Post debridement Stage noted as Unstageable/Unclassified. Character of Wound/Ulcer Post Debridement is improved. Post procedure Diagnosis Wound #4: Same as Pre-Procedure Plan Follow-up Appointments: Return Appointment in 2  weeks. Dressing Change Frequency: Wound #1 Left Trochanter: Change dressing three times week. Wound #2 Sacrum: Change dressing three times week. Wound #3 Left Gluteus: Change dressing three times week. Wound #4 Right Trochanter:  Change dressing three times week. Skin Barriers/Peri-Wound Care: Wound #1 Left Trochanter: Barrier cream Wound #2 Sacrum: Barrier cream Wound #3 Left Gluteus: Barrier cream Wound #4 Right Trochanter: Barrier cream Wound Cleansing: Wound #1 Left Trochanter: Clean wound with Normal Saline. Wound #2 Sacrum: Clean wound with Normal Saline. Wound #3 Left Gluteus: Clean wound with Normal Saline. Wound #4 Right Trochanter: Clean wound with Normal Saline. Primary Wound Dressing: Wound #1 Left Trochanter: Calcium Alginate with Silver Wound #2 Sacrum: Calcium Alginate with Silver Wound #3 Left Gluteus: Calcium Alginate with Silver Wound #4 Right Trochanter: Calcium Alginate with Silver Secondary Dressing: Wound #1 Left Trochanter: Dry Gauze ABD pad - secure with tape Wound #2 Sacrum: Dry Gauze ABD pad - secure with tape Wound #3 Left Gluteus: Dry Gauze ABD pad - secure with tape Wound #4 Right Trochanter: Dry Gauze ABD pad - secure with tape Home Health: Wound #1 Left Trochanter: Continue Home Health skilled nursing for wound care. Wound #2 Sacrum: [facility] skilled nursing for wound care. Wound #3 Left Gluteus: Continue Home Health skilled nursing for wound care. Wound #4 Right Trochanter: Continue Home Health skilled nursing for wound care. Radiology ordered were: X-ray, other sacrum - non healing wound, X-ray, other right trocanter Laboratory ordered were: Comprehensive metabolic panel=CMS, CBC W Auto Differential panel, Sed rate -method unspecified, C Reactive Protein in serum or plasma 1. This is a very frail woman with probably advanced vascular dementia. They told me they gave her medicationso Ultram before she came for  pain control and they think this might of made her mental status worse than it otherwise is. In any case she was noncommunicative today. 2. I went over in detail what it would take to try and heal these wounds up to including lab work, plain x-rays, possibly MRI, possible IV antibiotics etc. In the advanced state of her cognitive impairment I was interested in what the granddaughter felt she could put her grandmother through. She seemed to want all aggressive care. In view of this I have ordered lab work including a CMP, CBC with differential sedimentation rate and C-reactive protein 3. I have also ordered x-rays of the sacrum and right greater trochanter where the deepest wounds are 4. I am going to use silver alginate as the dressing until I look at the lab work as well as the x-rays. If this is all suggestive of osteomyelitis I will attempt to get bone from the sacrum for pathology and culture 5. I did give the granddaughter an ethical choice here given the advanced state of her dementia. She seems to want everything done even given this discussion I spent 45 minutes in review of this patient's past medical record face-to-face evaluation and preparation of this record Electronic Signature(s) Signed: 11/11/2019 5:16:39 PM By: Linton Ham MD Entered By: Linton Ham on 11/11/2019 13:38:58 -------------------------------------------------------------------------------- HxROS Details Patient Name: Date of Service: Erica Douglas A. 11/11/2019 10:30 A M Medical Record Number: [id number] Patient Account Number: [id number] Date of Birth/Sex: Treating RN: [birth year] (86 y.o. Elam Dutch Primary Care Provider: Carol Ada Other Clinician: Referring Provider: Treating Provider/Extender: Stefanie Libel, Lenor Derrick in Treatment: 0 Constitutional Symptoms (General Health) Complaints and Symptoms: Negative for: Fatigue; Fever; Chills; Marked Weight  Change Ear/Nose/Mouth/Throat Complaints and Symptoms: Negative for: Chronic sinus problems or rhinitis Medical History: Negative for: Chronic sinus problems/congestion; Middle ear problems Past Medical History Notes: hard of hearing, loss of smell, dysphagia Respiratory Complaints and Symptoms: Negative for: Chronic or frequent coughs;  Shortness of Breath Cardiovascular Complaints and Symptoms: Negative for: Chest pain Medical History: Positive for: Coronary Artery Disease; Hypertension; Myocardial Infarction Past Medical History Notes: dyslipidemia Gastrointestinal Complaints and Symptoms: Negative for: Frequent diarrhea; Nausea; Vomiting Endocrine Complaints and Symptoms: Negative for: Heat/cold intolerance Medical History: Negative for: Type I Diabetes; Type II Diabetes Genitourinary Complaints and Symptoms: Negative for: Frequent urination Medical History: Negative for: End Stage Renal Disease Past Medical History Notes: incontinence Integumentary (Skin) Complaints and Symptoms: Positive for: Wounds - sacrum and iliac Medical History: Negative for: History of Burn Musculoskeletal Complaints and Symptoms: Positive for: Muscle Weakness Medical History: Positive for: Osteoarthritis Past Medical History Notes: bilat hip busitis Neurologic Complaints and Symptoms: Negative for: Numbness/parasthesias Medical History: Positive for: Dementia Past Medical History Notes: CVA, TIAs Psychiatric Complaints and Symptoms: Negative for: Claustrophobia; Suicidal Medical History: Negative for: Anorexia/bulimia; Confinement Anxiety Eyes Medical History: Positive for: Cataracts; Glaucoma Hematologic/Lymphatic Immunological Oncologic Medical History: Negative for: Received Chemotherapy; Received Radiation Past Medical History Notes: skin CA HBO Extended History Items Eyes: Eyes: Cataracts Glaucoma Immunizations Pneumococcal Vaccine: Received Pneumococcal  Vaccination: Yes Implantable Devices No devices added Hospitalization / Surgery History Type of Hospitalization/Surgery bil knee replacements CABG 3 vessel right hip bursectomy Family and Social History Cancer: Yes - Siblings; Diabetes: Yes - Siblings; Heart Disease: No; Hereditary Spherocytosis: No; Hypertension: Yes - Siblings; Kidney Disease: No; Lung Disease: No; Seizures: No; Stroke: No; Thyroid Problems: No; Tuberculosis: No; Never smoker; Marital Status - Widowed; Alcohol Use: Rarely; Drug Use: No History; Caffeine Use: Daily - coke, diet pepsi; Financial Concerns: No; Food, Clothing or Shelter Needs: No; Support System Lacking: No; Transportation Concerns: Yes - difficult to get pt into Personal assistant) Signed: 11/11/2019 5:16:39 PM By: Linton Ham MD Signed: 11/11/2019 5:34:53 PM By: Baruch Gouty RN, BSN Entered By: Baruch Gouty on 11/11/2019 11:07:54 -------------------------------------------------------------------------------- [place] Details Patient Name: Date of Service: Erica Douglas A. 11/11/2019 Medical Record Number: [id number] Patient Account Number: [id number] Date of Birth/Sex: Treating RN: 04/20/1932 (86 y.o. F) Primary Care Provider: Carol Ada Other Clinician: Referring Provider: Treating Provider/Extender: Stefanie Libel, Hal Hope Weeks in Treatment: 0 Diagnosis Coding ICD-10 Codes Code Description L89.154 Pressure ulcer of sacral region, stage 4 L89.210 Pressure ulcer of right hip, unstageable L89.223 Pressure ulcer of left hip, stage 3 L89.322 Pressure ulcer of left buttock, stage 2 F01.50 Vascular dementia without behavioral disturbance Facility Procedures CPT4 Code: [id number] Description: [id number] - WOUND CARE VISIT-LEV 5 EST PT Modifier: 25 Quantity: 1 CPT4 Code: [id number] Description: [facility] - DEB SUBQ TISSUE 20 SQ CM/< ICD-10 Diagnosis Description L89.210 Pressure ulcer of right hip,  unstageable Modifier: Quantity: 1 Physician Procedures : CPT4 Code Description Modifier [id number] - WC PHYS LEVEL 4 - NEW PT 25 ICD-10 Diagnosis Description L89.154 Pressure ulcer of sacral region, stage 4 L89.210 Pressure ulcer of right hip, unstageable L89.223 Pressure ulcer of left hip, stage 3  L89.322 Pressure ulcer of left buttock, stage 2 Quantity: 1 : [id number] - WC PHYS SUBQ TISS 20 SQ CM ICD-10 Diagnosis Description L89.210 Pressure ulcer of right hip, unstageable Quantity: 1 Electronic Signature(s) Signed: 11/11/2019 5:16:39 PM By: Linton Ham MD Signed: 11/11/2019 5:57:23 PM By: Carlene Coria RN Entered By: Carlene Coria on 11/11/2019 13:42:33

## 2019-11-11 NOTE — Progress Notes (Addendum)
[name], PADMORE ([id number]) Visit Report for 11/11/2019 Allergy List Details Patient Name: Date of Service: [name]. 11/11/2019 10:30 A M Medical Record Number: [id number] Patient Account Number: [id number] Date of Birth/Sex: Treating RN: 05/13/1931 (86 y.o. Elam Dutch Primary Care Eily Louvier: Carol Ada Other Clinician: Referring Turner Baillie: Treating Al Gagen/Extender: Stefanie Libel, Candace Weeks in Treatment: 0 Allergies Active Allergies Tricor Reaction: muscle pain Zetia Reaction: peripheral edema Statins-Hmg-Coa Reductase Inhibitors Reaction: muscle pain Niaspan Extended-Release Reaction: severe flushing Relafen Reaction: rash NSAIDS (Non-Steroidal Anti-Inflammatory Drug) Reaction: urinary retention Livalo Reaction: muscle pain Allergy Notes Electronic Signature(s) Signed: 11/11/2019 5:34:53 PM By: Baruch Gouty RN, BSN Entered By: Baruch Gouty on 11/11/2019 10:57:34 -------------------------------------------------------------------------------- Arrival Information Details Patient Name: Date of Service: Erica Douglas A. 11/11/2019 10:30 A M Medical Record Number: [id number] Patient Account Number: [id number] Date of Birth/Sex: Treating RN: Mar 06, 1932 (86 y.o. Elam Dutch Primary Care [name]: Carol Ada Other Clinician: Referring [name]: Treating Zymere Patlan/Extender: Lona Millard in Treatment: 0 Visit Information Patient Arrived: Wheel Chair Arrival Time: 10:46 Accompanied By: granddaughter Transfer Assistance: Manual Patient Identification Verified: Yes Secondary Verification Process Completed: Yes Patient Requires Transmission-Based Precautions: No Patient Has Alerts: No Electronic Signature(s) Signed: 11/11/2019 5:34:53 PM By: Baruch Gouty RN, BSN Entered By: Baruch Gouty on 11/11/2019 10:49:07 -------------------------------------------------------------------------------- Clinic  Level of Care Assessment Details Patient Name: Date of Service: [name]. 11/11/2019 10:30 A M Medical Record Number: [id number] Patient Account Number: [id number] Date of Birth/Sex: Treating RN: 1931/06/20 (86 y.o. Orvan Falconer Primary Care Santiago Graf: Carol Ada Other Clinician: Referring Valgene Deloatch: Treating [name]/Extender: Stefanie Libel, Lenor Derrick in Treatment: 0 Clinic Level of Care Assessment Items TOOL 2 Quantity Score X- 1 0 Use when only an EandM is performed on the INITIAL visit ASSESSMENTS - Nursing Assessment / Reassessment X- 1 20 General Physical Exam (combine w/ comprehensive assessment (listed just below) when performed on new pt. evals) X- 1 25 Comprehensive Assessment (HX, ROS, Risk Assessments, Wounds Hx, etc.) ASSESSMENTS - Wound and Skin A ssessment / Reassessment []  - 0 Simple Wound Assessment / Reassessment - one wound X- 4 5 Complex Wound Assessment / Reassessment - multiple wounds []  - 0 Dermatologic / Skin Assessment (not related to wound area) ASSESSMENTS - Ostomy and/or Continence Assessment and Care []  - 0 Incontinence Assessment and Management []  - 0 Ostomy Care Assessment and Management (repouching, etc.) PROCESS - Coordination of Care []  - 0 Simple Patient / Family Education for ongoing care X- 1 20 Complex (extensive) Patient / Family Education for ongoing care X- 1 10 Staff obtains Programmer, systems, Records, T Results / Process Orders est []  - 0 Staff telephones HHA, Nursing Homes / Clarify orders / etc []  - 0 Routine Transfer to another Facility (non-emergent condition) []  - 0 Routine Hospital Admission (non-emergent condition) X- 1 15 New Admissions / Biomedical engineer / Ordering NPWT Apligraf, etc. , []  - 0 Emergency Hospital Admission (emergent condition) X- 1 10 Simple Discharge Coordination []  - 0 Complex (extensive) Discharge Coordination PROCESS - Special Needs []  - 0 Pediatric / Minor Patient  Management []  - 0 Isolation Patient Management []  - 0 Hearing / Language / Visual special needs []  - 0 Assessment of Community assistance (transportation, D/C planning, etc.) []  - 0 Additional assistance / Altered mentation []  - 0 Support Surface(s) Assessment (bed, cushion, seat, etc.) INTERVENTIONS - Wound Cleansing / Measurement []  - 0 Wound Imaging (photographs - any number of wounds) []  - 0  Wound Tracing (instead of photographs) []  - 0 Simple Wound Measurement - one wound X- 4 5 Complex Wound Measurement - multiple wounds []  - 0 Simple Wound Cleansing - one wound X- 4 5 Complex Wound Cleansing - multiple wounds INTERVENTIONS - Wound Dressings []  - 0 Small Wound Dressing one or multiple wounds X- 4 15 Medium Wound Dressing one or multiple wounds []  - 0 Large Wound Dressing one or multiple wounds []  - 0 Application of Medications - injection INTERVENTIONS - Miscellaneous []  - 0 External ear exam []  - 0 Specimen Collection (cultures, biopsies, blood, body fluids, etc.) []  - 0 Specimen(s) / Culture(s) sent or taken to Lab for analysis []  - 0 Patient Transfer (multiple staff / Civil Service fast streamer / Similar devices) []  - 0 Simple Staple / Suture removal (25 or less) []  - 0 Complex Staple / Suture removal (26 or more) []  - 0 Hypo / Hyperglycemic Management (close monitor of Blood Glucose) X- 1 15 Ankle / Brachial Index (ABI) - do not check if billed separately Has the patient been seen at the hospital within the last three years: Yes Total Score: 235 Level Of Care: New/Established - Level 5 Electronic Signature(s) Signed: 11/11/2019 5:57:23 PM By: Carlene Coria RN Entered By: Carlene Coria on 11/11/2019 13:41:51 -------------------------------------------------------------------------------- Encounter Discharge Information Details Patient Name: Date of Service: Erica Douglas A. 11/11/2019 10:30 A M Medical Record Number: [id number] Patient Account Number:  [id number] Date of Birth/Sex: Treating RN: [birth year] (86 y.o. [name] Primary Care Jamariya Davidoff: Carol Ada Other Clinician: Referring [name]: Treating Hollis Tuller/Extender: Lona Millard in Treatment: 0 Encounter Discharge Information Items Post Procedure Vitals Discharge Condition: Stable Temperature (F): 97.9 Ambulatory Status: Wheelchair Pulse (bpm): 67 Discharge Destination: Home Respiratory Rate (breaths/min): 18 Transportation: Private Auto Blood Pressure (mmHg): 109/59 Accompanied By: caregiver and granddaughter Schedule Follow-up Appointment: Yes Clinical Summary of Care: Patient Declined Electronic Signature(s) Signed: 11/11/2019 5:18:51 PM By: Kela Millin Entered By: Kela Millin on 11/11/2019 13:55:18 -------------------------------------------------------------------------------- Lower Extremity Assessment Details Patient Name: Date of Service: [name], Erica A. 11/11/2019 10:30 A M Medical Record Number: [id number] Patient Account Number: [id number] Date of Birth/Sex: Treating RN: 03-01-1932 (86 y.o. Elam Dutch Primary Care Brittane Grudzinski: Carol Ada Other Clinician: Referring Lyrik Dockstader: Treating Johntay Doolen/Extender: Stefanie Libel, Lenor Derrick in Treatment: 0 Electronic Signature(s) Signed: 11/11/2019 5:34:53 PM By: Baruch Gouty RN, BSN Entered By: Baruch Gouty on 11/11/2019 11:12:37 -------------------------------------------------------------------------------- Multi Wound Chart Details Patient Name: Date of Service: Erica Douglas A. 11/11/2019 10:30 A M Medical Record Number: [id number] Patient Account Number: [id number] Date of Birth/Sex: Treating RN: 10/29/1931 (86 y.o. F) Primary Care Jasier Calabretta: Carol Ada Other Clinician: Referring Jaimin Krupka: Treating Cathlyn Tersigni/Extender: Stefanie Libel, Candace Weeks in Treatment: 0 Vital Signs Height(in): 70 Pulse(bpm):  37 Weight(lbs): Blood Pressure(mmHg): 109/59 Body Mass Index(BMI): Temperature(F): 97.9 Respiratory Rate(breaths/min): 18 Photos: [1:No Photos Left Trochanter] [2:No Photos Sacrum] [3:No Photos Left Gluteus] Wound Location: [1:Pressure Injury] [2:Pressure Injury] [3:Pressure Injury] Wounding Event: [1:Pressure Ulcer] [2:Pressure Ulcer] [3:Pressure Ulcer] Primary Etiology: [1:Cataracts, Glaucoma, Coronary Artery Cataracts, Glaucoma, Coronary Artery Cataracts, Glaucoma, Coronary Artery] Comorbid History: [1:Disease, Hypertension, Myocardial Disease, Hypertension, Myocardial Disease, Hypertension, Myocardial Infarction, Osteoarthritis, Dementia Infarction, Osteoarthritis, Dementia Infarction, Osteoarthritis, Dementia 10/02/2019]  [2:10/02/2019] [3:10/02/2019] Date Acquired: [1:0] [2:0] [3:0] Weeks of Treatment: [1:Open] [2:Open] [3:Open] Wound Status: [1:Yes] [2:No] [3:No] Clustered Wound: [1:4x2.4x0.1] [2:4.8x2x2.5] [3:0.8x0.4x0.1] Measurements L x W x D (cm) [1:7.54] [2:7.54] [3:0.251] A (cm) : rea [1:0.754] [2:18.85] [3:0.025] Volume (cm) : [2:12] Starting Position  1 (o'clock): [2:12] Ending Position 1 (o'clock): [2:4.3] Maximum Distance 1 (cm): [1:No] [2:Yes] [3:No] Undermining: [1:Unstageable/Unclassified] [2:Category/Stage IV] [3:Category/Stage II] Classification: [1:Small] [2:Large] [3:Small] Exudate A mount: [1:Serous] [2:Serosanguineous] [3:Serous] Exudate Type: [1:amber] [2:red, brown] [3:amber] Exudate Color: [1:Flat and Intact] [2:Well defined, not attached] [3:Flat and Intact] Wound Margin: [1:None Present (0%)] [2:Large (67-100%)] [3:None Present (0%)] Granulation A mount: [1:N/A] [2:Red] [3:N/A] Granulation Quality: [1:Large (67-100%)] [2:Small (1-33%)] [3:Large (67-100%)] Necrotic A mount: [1:Adherent Slough] [2:Adherent Slough] [3:Adherent Slough] Necrotic Tissue: [1:Fat Layer (Subcutaneous Tissue)] [2:Fat Layer (Subcutaneous Tissue)] [3:Fascia: No] Exposed  Structures: [1:Exposed: Yes Fascia: No Tendon: No Muscle: No Joint: No Bone: No Medium (34-66%)] [2:Exposed: Yes Muscle: Yes Bone: Yes Fascia: No Tendon: No Joint: No None] [3:Fat Layer (Subcutaneous Tissue) Exposed: No Tendon: No Muscle: No Joint: No Bone: No  Limited to Skin Breakdown Large (67-100%)] Epithelialization: [1:N/A] [2:N/A] [3:N/A] Debridement: [1:N/A] [2:N/A] [3:N/A] Pain Control: [1:N/A] [2:N/A] [3:N/A] Tissue Debrided: [1:N/A] [2:N/A] [3:N/A] Level: [1:N/A] [2:N/A] [3:N/A] Debridement A (sq cm): [1:rea N/A] [2:N/A] [3:N/A] Instrument: [1:N/A] [2:N/A] [3:N/A] Bleeding: [1:N/A] [2:N/A] [3:N/A] Hemostasis A chieved: [1:N/A] [2:N/A] [3:N/A] Procedural Pain: [1:N/A] [2:N/A] [3:N/A] Post Procedural Pain: [1:N/A] [2:N/A] [3:N/A] Debridement Treatment Response: [1:N/A] [2:N/A] [3:N/A] Post Debridement Measurements L x W x D (cm) [1:N/A] [2:N/A] [3:N/A] Post Debridement Volume: (cm) [1:N/A] [2:N/A] [3:N/A] Post Debridement Stage: [1:N/A] [2:N/A] [3:N/A] Wound Number: 4 N/A N/A Photos: No Photos N/A N/A Right Trochanter N/A N/A Wound Location: Pressure Injury N/A N/A Wounding Event: Pressure Ulcer N/A N/A Primary Etiology: Cataracts, Glaucoma, Coronary Artery N/A N/A Comorbid History: Disease, Hypertension, Myocardial Infarction, Osteoarthritis, Dementia 10/02/2019 N/A N/A Date Acquired: 0 N/A N/A Weeks of Treatment: Open N/A N/A Wound Status: No N/A N/A Clustered Wound: 3.2x3.3x0.1 N/A N/A Measurements L x W x D (cm) 8.294 N/A N/A A (cm) : rea 0.829 N/A N/A Volume (cm) : No N/A N/A Undermining: Unstageable/Unclassified N/A N/A Classification: Medium N/A N/A Exudate A mount: Serous N/A N/A Exudate Type: amber N/A N/A Exudate Color: Flat and Intact N/A N/A Wound Margin: Small (1-33%) N/A N/A Granulation A mount: Pink N/A N/A Granulation Quality: Large (67-100%) N/A N/A Necrotic A mount: Eschar, Adherent Slough N/A N/A Necrotic Tissue: Fat Layer  (Subcutaneous Tissue) N/A N/A Exposed Structures: Exposed: Yes Fascia: No Tendon: No Muscle: No Joint: No Bone: No None N/A N/A Epithelialization: Debridement - Excisional N/A N/A Debridement: Pre-procedure Verification/Time Out 12:35 N/A N/A Taken: Other N/A N/A Pain Control: Necrotic/Eschar, Subcutaneous, N/A N/A Tissue Debrided: Slough Skin/Subcutaneous Tissue N/A N/A Level: 10.56 N/A N/A Debridement A (sq cm): rea Curette N/A N/A Instrument: Moderate N/A N/A Bleeding: Pressure N/A N/A Hemostasis Achieved: 5 N/A N/A Procedural Pain: 1 N/A N/A Post Procedural Pain: Debridement Treatment Response: Procedure was tolerated well N/A N/A Post Debridement Measurements L x 3.2x3.3x0.1 N/A N/A W x D (cm) 0.829 N/A N/A Post Debridement Volume: (cm) Unstageable/Unclassified N/A N/A Post Debridement Stage: Debridement N/A N/A Procedures Performed: Treatment Notes Electronic Signature(s) Signed: 11/11/2019 5:16:39 PM By: Linton Ham MD Entered By: Linton Ham on 11/11/2019 13:32:15 -------------------------------------------------------------------------------- Multi-Disciplinary Care Plan Details Patient Name: Date of Service: Erica Douglas A. 11/11/2019 10:30 A M Medical Record Number: [id number] Patient Account Number: [id number] Date of Birth/Sex: Treating RN: 1932/04/30 (86 y.o. Orvan Falconer Primary Care Rande Dario: Carol Ada Other Clinician: Referring Militza Devery: Treating Zuleica Seith/Extender: Stefanie Libel, Lenor Derrick in Treatment: 0 Active Inactive Electronic Signature(s) Signed: 11/26/2019 6:05:43 PM By: Carlene Coria RN Previous Signature: 11/11/2019 5:57:23 PM Version By: Carlene Coria RN Entered By:  Carlene Coria on 11/25/2019 09:37:14 -------------------------------------------------------------------------------- Pain Assessment Details Patient Name: Date of Service: [name]. 11/11/2019 10:30 A M Medical Record Number:  [id number] Patient Account Number: [id number] Date of Birth/Sex: Treating RN: 06-25-1931 (86 y.o. Elam Dutch Primary Care Navdeep Fessenden: Carol Ada Other Clinician: Referring Althea Backs: Treating Merel Santoli/Extender: Stefanie Libel, Lenor Derrick in Treatment: 0 Active Problems Location of Pain Severity and Description of Pain Patient Has Paino Yes Site Locations Pain Location: Pain in Ulcers With Dressing Change: Yes Rate the pain. Current Pain Level: 8 Least Pain Level: 0 Pain Management and Medication Current Pain Management: Notes pt unable to verbalize pain Electronic Signature(s) Signed: 11/11/2019 5:34:53 PM By: Baruch Gouty RN, BSN Entered By: Baruch Gouty on 11/11/2019 11:45:50 -------------------------------------------------------------------------------- Patient/Caregiver Education Details Patient Name: Date of Service: [name] 7/13/2021andnbsp10:30 [place] Record Number: [id number] Patient Account Number: [id number] Date of Birth/Gender: Treating RN: [birth year] (86 y.o. Orvan Falconer Primary Care Physician: Carol Ada Other Clinician: Referring Physician: Treating Physician/Extender: Lona Millard in Treatment: 0 Education Assessment Education Provided To: Patient Education Topics Provided Wound/Skin Impairment: Methods: Explain/Verbal Responses: State content correctly Electronic Signature(s) Signed: 11/11/2019 5:57:23 PM By: Carlene Coria RN Entered By: Carlene Coria on 11/11/2019 12:26:15 -------------------------------------------------------------------------------- Wound Assessment Details Patient Name: Date of Service: Erica Douglas A. 11/11/2019 10:30 A M Medical Record Number: [id number] Patient Account Number: [id number] Date of Birth/Sex: Treating RN: Jun 29, 1931 (86 y.o. Elam Dutch Primary Care Laylee Schooley: Carol Ada Other Clinician: Referring Laraya Pestka: Treating  Patsy Zaragoza/Extender: Stefanie Libel, Candace Weeks in Treatment: 0 Wound Status Wound Number: 1 Primary Pressure Ulcer Etiology: Wound Location: Left Trochanter Wound Open Wounding Event: Pressure Injury Status: Date Acquired: 10/02/2019 Comorbid Cataracts, Glaucoma, Coronary Artery Disease, Hypertension, Weeks Of Treatment: 0 History: Myocardial Infarction, Osteoarthritis, Dementia Clustered Wound: Yes Photos Photo Uploaded By: Mikeal Hawthorne on 11/11/2019 15:41:53 Wound Measurements Length: (cm) 4 Width: (cm) 2.4 Depth: (cm) 0.1 Area: (cm) 7.54 Volume: (cm) 0.754 % Reduction in Area: % Reduction in Volume: Epithelialization: Medium (34-66%) Tunneling: No Undermining: No Wound Description Classification: Unstageable/Unclassified Wound Margin: Flat and Intact Exudate Amount: Small Exudate Type: Serous Exudate Color: amber Foul Odor After Cleansing: No Slough/Fibrino Yes Wound Bed Granulation Amount: None Present (0%) Exposed Structure Necrotic Amount: Large (67-100%) Fascia Exposed: No Necrotic Quality: Adherent Slough Fat Layer (Subcutaneous Tissue) Exposed: Yes Tendon Exposed: No Muscle Exposed: No Joint Exposed: No Bone Exposed: No Electronic Signature(s) Signed: 11/11/2019 5:34:53 PM By: Baruch Gouty RN, BSN Entered By: Baruch Gouty on 11/11/2019 11:39:11 -------------------------------------------------------------------------------- Wound Assessment Details Patient Name: Date of Service: Erica Douglas A. 11/11/2019 10:30 A M Medical Record Number: [id number] Patient Account Number: [id number] Date of Birth/Sex: Treating RN: 1932/04/16 (86 y.o. Elam Dutch Primary Care [name]: Carol Ada Other Clinician: Referring Toshiro Hanken: Treating Anjannette Gauger/Extender: Stefanie Libel, Candace Weeks in Treatment: 0 Wound Status Wound Number: 2 Primary Pressure Ulcer Etiology: Wound Location: Sacrum Wound Open Wounding Event:  Pressure Injury Status: Date Acquired: 10/02/2019 Comorbid Cataracts, Glaucoma, Coronary Artery Disease, Hypertension, Weeks Of Treatment: 0 History: Myocardial Infarction, Osteoarthritis, Dementia Clustered Wound: No Photos Photo Uploaded By: Mikeal Hawthorne on 11/11/2019 15:41:54 Wound Measurements Length: (cm) 4.8 Width: (cm) 2 Depth: (cm) 2.5 Area: (cm) 7.54 Volume: (cm) 18.85 % Reduction in Area: % Reduction in Volume: Epithelialization: None Tunneling: No Undermining: Yes Starting Position (o'clock): 12 Ending Position (o'clock): 12 Maximum Distance: (cm) 4.3 Wound Description Classification: Category/Stage IV Wound Margin: Well defined, not attached  Exudate Amount: Large Exudate Type: Serosanguineous Exudate Color: red, brown Foul Odor After Cleansing: No Slough/Fibrino Yes Wound Bed Granulation Amount: Large (67-100%) Exposed Structure Granulation Quality: Red Fascia Exposed: No Necrotic Amount: Small (1-33%) Fat Layer (Subcutaneous Tissue) Exposed: Yes Necrotic Quality: Adherent Slough Tendon Exposed: No Muscle Exposed: Yes Necrosis of Muscle: No Joint Exposed: No Bone Exposed: Yes Electronic Signature(s) Signed: 11/11/2019 5:34:53 PM By: Baruch Gouty RN, BSN Entered By: Baruch Gouty on 11/11/2019 11:42:17 -------------------------------------------------------------------------------- Wound Assessment Details Patient Name: Date of Service: Erica Douglas A. 11/11/2019 10:30 A M Medical Record Number: [id number] Patient Account Number: [id number] Date of Birth/Sex: Treating RN: 07/16/1931 (86 y.o. Elam Dutch Primary Care Cuca Benassi: Carol Ada Other Clinician: Referring Amir Glaus: Treating Sayra Frisby/Extender: Stefanie Libel, Candace Weeks in Treatment: 0 Wound Status Wound Number: 3 Primary Pressure Ulcer Etiology: Wound Location: Left Gluteus Wound Open Wounding Event: Pressure Injury Status: Date Acquired:  10/02/2019 Comorbid Cataracts, Glaucoma, Coronary Artery Disease, Hypertension, Weeks Of Treatment: 0 History: Myocardial Infarction, Osteoarthritis, Dementia Clustered Wound: No Photos Photo Uploaded By: Mikeal Hawthorne on 11/11/2019 15:42:55 Wound Measurements Length: (cm) 0.8 Width: (cm) 0.4 Depth: (cm) 0.1 Area: (cm) 0.251 Volume: (cm) 0.025 Wound Description Classification: Category/Stage II Wound Margin: Flat and Intact Exudate Amount: Small Exudate Type: Serous Exudate Color: amber Foul Odor After Cleansing: Slough/Fibrino % Reduction in Area: % Reduction in Volume: Epithelialization: Large (67-100%) Tunneling: No Undermining: No No No Wound Bed Granulation Amount: None Present (0%) Exposed Structure Necrotic Amount: Large (67-100%) Fascia Exposed: No Necrotic Quality: Adherent Slough Fat Layer (Subcutaneous Tissue) Exposed: No Tendon Exposed: No Muscle Exposed: No Joint Exposed: No Bone Exposed: No Limited to Skin Breakdown Electronic Signature(s) Signed: 11/11/2019 5:34:53 PM By: Baruch Gouty RN, BSN Entered By: Baruch Gouty on 11/11/2019 11:43:54 -------------------------------------------------------------------------------- Wound Assessment Details Patient Name: Date of Service: Erica Douglas A. 11/11/2019 10:30 A M Medical Record Number: [id number] Patient Account Number: [id number] Date of Birth/Sex: Treating RN: [birth year] (86 y.o. Elam Dutch Primary Care Harleigh Civello: Carol Ada Other Clinician: Referring Aolani Piggott: Treating Jamielynn Wigley/Extender: Stefanie Libel, Candace Weeks in Treatment: 0 Wound Status Wound Number: 4 Primary Pressure Ulcer Etiology: Wound Location: Right Trochanter Wound Open Wounding Event: Pressure Injury Status: Date Acquired: 10/02/2019 Comorbid Cataracts, Glaucoma, Coronary Artery Disease, Hypertension, Weeks Of Treatment: 0 History: Myocardial Infarction, Osteoarthritis, Dementia Clustered  Wound: No Photos Photo Uploaded By: Mikeal Hawthorne on 11/11/2019 15:42:56 Wound Measurements Length: (cm) 3.2 Width: (cm) 3.3 Depth: (cm) 0.1 Area: (cm) 8.294 Volume: (cm) 0.829 % Reduction in Area: % Reduction in Volume: Epithelialization: None Tunneling: No Undermining: No Wound Description Classification: Unstageable/Unclassified Wound Margin: Flat and Intact Exudate Amount: Medium Exudate Type: Serous Exudate Color: amber Foul Odor After Cleansing: No Slough/Fibrino Yes Wound Bed Granulation Amount: Small (1-33%) Exposed Structure Granulation Quality: Pink Fascia Exposed: No Necrotic Amount: Large (67-100%) Fat Layer (Subcutaneous Tissue) Exposed: Yes Necrotic Quality: Eschar, Adherent Slough Tendon Exposed: No Muscle Exposed: No Joint Exposed: No Bone Exposed: No Electronic Signature(s) Signed: 11/11/2019 5:34:53 PM By: Baruch Gouty RN, BSN Entered By: Baruch Gouty on 11/11/2019 11:45:08 -------------------------------------------------------------------------------- [name] Details Patient Name: Date of Service: Erica Douglas A. 11/11/2019 10:30 A M Medical Record Number: [id number] Patient Account Number: [id number] Date of Birth/Sex: Treating RN: 01-24-1932 (86 y.o. Elam Dutch Primary Care Oralee Rapaport: Carol Ada Other Clinician: Referring Kaedon Fanelli: Treating Milee Qualls/Extender: Stefanie Libel, Lenor Derrick in Treatment: 0 Vital Signs Time Taken: 10:51 Temperature (F): 97.9 Height (in): 67 Pulse (bpm): 67 Source: Stated Respiratory  Rate (breaths/min): 18 Blood Pressure (mmHg): 109/59 Reference Range: 80 - 120 mg / dl Electronic Signature(s) Signed: 11/11/2019 5:34:53 PM By: Baruch Gouty RN, BSN Entered By: Baruch Gouty on 11/11/2019 10:53:57

## 2019-11-11 NOTE — Progress Notes (Addendum)
[name], [name] ([id number]) Visit Report for 11/11/2019 Arrival Information Details Patient Name: Date of Service: [name], [name] 11/11/2019 3:30 PM Medical Record Number: [id number] Patient Account Number: [id number] Date of Birth/Sex: Treating RN: 1931-07-13 (86 y.o. [name] Primary Care Lakhia Gengler: Carol Ada Other Clinician: Referring Brinton Brandel: Treating Clancy Leiner/Extender: Lona Millard in Treatment: 0 Visit Information Patient Arrived: Wheel Chair Arrival Time: 16:30 Accompanied By: granddaughter Transfer Assistance: Harrel Lemon Lift Patient Identification Verified: Yes Secondary Verification Process Completed: Yes Patient Requires Transmission-Based Precautions: No Patient Has Alerts: No Electronic Signature(s) Signed: 11/11/2019 5:18:51 PM By: Kela Millin Entered By: Kela Millin on 11/11/2019 16:30:57 -------------------------------------------------------------------------------- Clinic Level of Care Assessment Details Patient Name: Date of Service: [name], [name] 11/11/2019 3:30 PM Medical Record Number: [id number] Patient Account Number: [id number] Date of Birth/Sex: Treating RN: 03-12-1932 (86 y.o. [name] Primary Care Tomie Elko: Carol Ada Other Clinician: Referring Loron Weimer: Treating Jaeshaun Riva/Extender: Stefanie Libel, Lenor Derrick in Treatment: 0 Clinic Level of Care Assessment Items TOOL 4 Quantity Score X- 1 0 Use when only an EandM is performed on FOLLOW-UP visit ASSESSMENTS - Nursing Assessment / Reassessment X- 1 10 Reassessment of Co-morbidities (includes updates in patient status) X- 1 5 Reassessment of Adherence to Treatment Plan ASSESSMENTS - Wound and Skin A ssessment / Reassessment X - Simple Wound Assessment / Reassessment - one wound 1 5 []  - 0 Complex Wound Assessment / Reassessment - multiple wounds []  - 0 Dermatologic / Skin Assessment (not related to wound  area) ASSESSMENTS - Focused Assessment []  - 0 Circumferential Edema Measurements - multi extremities []  - 0 Nutritional Assessment / Counseling / Intervention []  - 0 Lower Extremity Assessment (monofilament, tuning fork, pulses) []  - 0 Peripheral Arterial Disease Assessment (using hand held doppler) ASSESSMENTS - Ostomy and/or Continence Assessment and Care []  - 0 Incontinence Assessment and Management []  - 0 Ostomy Care Assessment and Management (repouching, etc.) PROCESS - Coordination of Care X - Simple Patient / Family Education for ongoing care 1 15 []  - 0 Complex (extensive) Patient / Family Education for ongoing care X- 1 10 Staff obtains Programmer, systems, Records, T Results / Process Orders est []  - 0 Staff telephones HHA, Nursing Homes / Clarify orders / etc []  - 0 Routine Transfer to another Facility (non-emergent condition) []  - 0 Routine Hospital Admission (non-emergent condition) []  - 0 New Admissions / Biomedical engineer / Ordering NPWT Apligraf, etc. , []  - 0 Emergency Hospital Admission (emergent condition) X- 1 10 Simple Discharge Coordination []  - 0 Complex (extensive) Discharge Coordination PROCESS - Special Needs []  - 0 Pediatric / Minor Patient Management []  - 0 Isolation Patient Management []  - 0 Hearing / Language / Visual special needs []  - 0 Assessment of Community assistance (transportation, D/C planning, etc.) []  - 0 Additional assistance / Altered mentation []  - 0 Support Surface(s) Assessment (bed, cushion, seat, etc.) INTERVENTIONS - Wound Cleansing / Measurement X - Simple Wound Cleansing - one wound 1 5 []  - 0 Complex Wound Cleansing - multiple wounds []  - 0 Wound Imaging (photographs - any number of wounds) []  - 0 Wound Tracing (instead of photographs) X- 1 5 Simple Wound Measurement - one wound []  - 0 Complex Wound Measurement - multiple wounds INTERVENTIONS - Wound Dressings X - Small Wound Dressing one or multiple wounds  1 10 []  - 0 Medium Wound Dressing one or multiple wounds []  - 0 Large Wound Dressing one or multiple wounds []  - 0 Application of  Medications - topical []  - 0 Application of Medications - injection INTERVENTIONS - Miscellaneous []  - 0 External ear exam []  - 0 Specimen Collection (cultures, biopsies, blood, body fluids, etc.) []  - 0 Specimen(s) / Culture(s) sent or taken to Lab for analysis []  - 0 Patient Transfer (multiple staff / Harrel Lemon Lift / Similar devices) []  - 0 Simple Staple / Suture removal (25 or less) []  - 0 Complex Staple / Suture removal (26 or more) []  - 0 Hypo / Hyperglycemic Management (close monitor of Blood Glucose) []  - 0 Ankle / Brachial Index (ABI) - do not check if billed separately X- 1 5 Vital Signs Has the patient been seen at the hospital within the last three years: Yes Total Score: 80 Level Of Care: New/Established - Level 3 Electronic Signature(s) Signed: 11/11/2019 5:18:51 PM By: Kela Millin Entered By: Kela Millin on 11/11/2019 16:35:33 -------------------------------------------------------------------------------- Encounter Discharge Information Details Patient Name: Date of Service: Erica Douglas A. 11/11/2019 3:30 PM Medical Record Number: [id number] Patient Account Number: [id number] Date of Birth/Sex: Treating RN: 11-Mar-1932 (86 y.o. [name] Primary Care Crescentia Boutwell: Carol Ada Other Clinician: Referring Meagon Duskin: Treating Simmie Camerer/Extender: Lona Millard in Treatment: 0 Encounter Discharge Information Items Discharge Condition: Stable Ambulatory Status: Wheelchair Discharge Destination: Home Transportation: Private Auto Accompanied By: granddaughter Schedule Follow-up Appointment: Yes Clinical Summary of Care: Patient Declined Electronic Signature(s) Signed: 11/11/2019 5:18:51 PM By: Kela Millin Entered By: Kela Millin on 11/11/2019  16:34:58 -------------------------------------------------------------------------------- Patient/Caregiver Education Details Patient Name: Date of Service: [name] 7/13/2021andnbsp3:30 PM Medical Record Number: [id number] Patient Account Number: [id number] Date of Birth/Gender: Treating RN: Feb 07, 1932 (86 y.o. [name] Primary Care Physician: Carol Ada Other Clinician: Referring Physician: Treating Physician/Extender: Lona Millard in Treatment: 0 Education Assessment Education Provided To: Caregiver granddaughter Education Topics Provided Wound/Skin Impairment: Handouts: Caring for Your Ulcer Methods: Explain/Verbal Responses: State content correctly Electronic Signature(s) Signed: 11/11/2019 5:18:51 PM By: Kela Millin Entered By: Kela Millin on 11/11/2019 16:34:31 -------------------------------------------------------------------------------- Wound Assessment Details Patient Name: Date of Service: Erica Douglas A. 11/11/2019 3:30 PM Medical Record Number: [id number] Patient Account Number: [id number] Date of Birth/Sex: Treating RN: May 13, 1931 (86 y.o. [name] Primary Care Katerina Zurn: Carol Ada Other Clinician: Referring Wadie Liew: Treating Amoni Morales/Extender: Stefanie Libel, Candace Weeks in Treatment: 0 Wound Status Wound Number: 4 Primary Pressure Ulcer Etiology: Wound Location: Right Trochanter Wound Open Wounding Event: Pressure Injury Status: Date Acquired: 10/02/2019 Comorbid Cataracts, Glaucoma, Coronary Artery Disease, Hypertension, Weeks Of Treatment: 0 History: Myocardial Infarction, Osteoarthritis, Dementia Clustered Wound: No Wound Measurements Length: (cm) 3.2 Width: (cm) 3.3 Depth: (cm) 0.5 Area: (cm) 8.294 Volume: (cm) 4.147 % Reduction in Area: 0% % Reduction in Volume: -400.2% Epithelialization: None Tunneling: No Undermining: No Wound  Description Classification: Unstageable/Unclassified Wound Margin: Well defined, not attached Exudate Amount: Large Exudate Type: Serosanguineous Exudate Color: red, brown Foul Odor After Cleansing: No Slough/Fibrino Yes Wound Bed Granulation Amount: Small (1-33%) Exposed Structure Granulation Quality: Pink Fascia Exposed: No Necrotic Amount: Large (67-100%) Fat Layer (Subcutaneous Tissue) Exposed: Yes Necrotic Quality: Adherent Slough Tendon Exposed: No Muscle Exposed: No Joint Exposed: No Bone Exposed: No Treatment Notes Wound #4 (Right Trochanter) 1. Cleanse With Wound Cleanser 2. Periwound Care Skin Prep 3. Primary Dressing Applied Other primary dressing (specifiy in notes) 4. Secondary Dressing ABD Pad Dry Gauze 5. Secured With Tape Notes patients daughter called and said wound was bleeding, patient returned after getting x-ray and site was no longer  bleeding. surgifoam was applied, with 4x4/abd/medipore tape (pressure dressing). MD aware Electronic Signature(s) Signed: 11/11/2019 5:18:51 PM By: Kela Millin Entered By: Kela Millin on 11/11/2019 16:31:51 -------------------------------------------------------------------------------- Vitals Details Patient Name: Date of Service: Erica Douglas A. 11/11/2019 3:30 PM Medical Record Number: [id number] Patient Account Number: [id number] Date of Birth/Sex: Treating RN: 30-Nov-1931 (86 y.o. [name] Primary Care Sally Reimers: Carol Ada Other Clinician: Referring Shuree Brossart: Treating Malaijah Houchen/Extender: Stefanie Libel, Candace Weeks in Treatment: 0 Vital Signs Time Taken: 16:00 Temperature (F): 98 Height (in): 67 Pulse (bpm): 67 Respiratory Rate (breaths/min): 18 Blood Pressure (mmHg): 109/59 Reference Range: 80 - 120 mg / dl Electronic Signature(s) Signed: 11/11/2019 5:18:51 PM By: Kela Millin Entered By: Kela Millin on 11/11/2019 16:31:17

## 2019-11-11 NOTE — Progress Notes (Addendum)
FARHANA, FELLOWS ([id number]) Visit Report for 11/11/2019 SuperBill Details Patient Name: Date of Service: [name], [name] 11/11/2019 Medical Record Number: [id number] Patient Account Number: [id number] Date of Birth/Sex: Treating RN: 16-Mar-1932 (86 y.o. [name] Primary Care Provider: Carol Ada Other Clinician: Referring Provider: Treating Provider/Extender: Stefanie Libel, Lenor Derrick in Treatment: 0 Diagnosis Coding ICD-10 Codes Code Description [phone] Pressure ulcer of sacral region, stage 4 L89.210 Pressure ulcer of right hip, unstageable L89.223 Pressure ulcer of left hip, stage 3 L89.322 Pressure ulcer of left buttock, stage 2 F01.50 Vascular dementia without behavioral disturbance Facility Procedures CPT4 Code Description Modifier Quantity [id number] - WOUND CARE VISIT-LEV 3 EST PT 1 Electronic Signature(s) Signed: 11/11/2019 5:16:39 PM By: Linton Ham MD Signed: 11/11/2019 5:18:51 PM By: Kela Millin Entered By: Kela Millin on 11/11/2019 16:35:43

## 2019-11-11 NOTE — Progress Notes (Addendum)
[name], [name] ([id number]) Visit Report for 11/11/2019 Abuse/Suicide Risk Screen Details Patient Name: Date of Service: [name], [name] 11/11/2019 10:30 A M Medical Record Number: [id number] Patient Account Number: [id number] Date of Birth/Sex: Treating RN: [birth year] (86 y.o. Elam Dutch Primary Care Dutch Ing: Carol Ada Other Clinician: Referring Larone Kliethermes: Treating Hadiyah Maricle/Extender: Stefanie Libel, Candace Weeks in Treatment: 0 Abuse/Suicide Risk Screen Items Answer ABUSE RISK SCREEN: Has anyone close to you tried to hurt or harm you recentlyo No Do you feel uncomfortable with anyone in your familyo No Has anyone forced you do things that you didnt want to doo No Electronic Signature(s) Signed: 11/11/2019 5:34:53 PM By: Baruch Gouty RN, BSN Entered By: Baruch Gouty on 11/11/2019 11:09:06 -------------------------------------------------------------------------------- Activities of Daily Living Details Patient Name: Date of Service: [name] 11/11/2019 10:30 A M Medical Record Number: [id number] Patient Account Number: [id number] Date of Birth/Sex: Treating RN: [birth year] (86 y.o. Elam Dutch Primary Care Jarryn Altland: Carol Ada Other Clinician: Referring Shakenna Herrero: Treating Kendrix Orman/Extender: Stefanie Libel, Lenor Derrick in Treatment: 0 Activities of Daily Living Items Answer Activities of Daily Living (Please select one for each item) Drive Automobile Not Able T Medications ake Not Able Use T elephone Not Able Care for Appearance Not Able Use T oilet Not Able Manus Rudd / Shower Not Able Dress Self Not Able Feed Self Not Able Walk Not Able Get In / Out Bed Not Able Housework Not Able Prepare Meals Not Able Handle Money Not Able Shop for Self Not Able Electronic Signature(s) Signed: 11/11/2019 5:34:53 PM By: Baruch Gouty RN, BSN Entered By: Baruch Gouty on 11/11/2019  11:09:34 -------------------------------------------------------------------------------- Education Screening Details Patient Name: Date of Service: Erica Douglas A. 11/11/2019 10:30 A M Medical Record Number: [id number] Patient Account Number: [id number] Date of Birth/Sex: Treating RN: [birth year] (86 y.o. Elam Dutch Primary Care Lakeia Bradshaw: Carol Ada Other Clinician: Referring Dekisha Mesmer: Treating Allen Basista/Extender: Lona Millard in Treatment: 0 Primary Learner Assessed: Caregiver Reason Patient is not Primary Learner: dementia Learning Preferences/Education Level/Primary Language Learning Preference: Explanation, Demonstration, Printed Material Highest Education Level: College or Above Preferred Language: English Cognitive Barrier Language Barrier: No Translator Needed: No Memory Deficit: Yes dementia Emotional Barrier: No Cultural/Religious Beliefs Affecting Medical Care: No Physical Barrier Impaired Vision: No Impaired Hearing: No Decreased Hand dexterity: No Knowledge/Comprehension Knowledge Level: High Comprehension Level: High Ability to understand written instructions: High Ability to understand verbal instructions: High Motivation Anxiety Level: Calm Cooperation: Cooperative Education Importance: Acknowledges Need Interest in Health Problems: Asks Questions Perception: Coherent Willingness to Engage in Self-Management High Activities: Readiness to Engage in Self-Management High Activities: Electronic Signature(s) Signed: 11/11/2019 5:34:53 PM By: Baruch Gouty RN, BSN Entered By: Baruch Gouty on 11/11/2019 11:10:39 -------------------------------------------------------------------------------- Fall Risk Assessment Details Patient Name: Date of Service: Erica Douglas A. 11/11/2019 10:30 A M Medical Record Number: [id number] Patient Account Number: [id number] Date of Birth/Sex: Treating RN: Mar 25, 1932 (86 y.o. Elam Dutch Primary Care Aries Kasa: Carol Ada Other Clinician: Referring Ivis Henneman: Treating Amari Burnsworth/Extender: Stefanie Libel, Lenor Derrick in Treatment: 0 Fall Risk Assessment Items Have you had 2 or more falls in the last 12 monthso 0 Yes Have you had any fall that resulted in injury in the last 12 monthso 0 Yes FALLS RISK SCREEN History of falling - immediate or within 3 months 0 No Secondary diagnosis (Do you have 2 or more medical diagnoseso) 0 No Ambulatory aid None/bed rest/wheelchair/nurse 0 Yes Crutches/cane/walker 0 No Furniture 0 No  Intravenous therapy Access/Saline/Heparin Lock 0 No Gait/Transferring Normal/ bed rest/ wheelchair 0 Yes Weak (short steps with or without shuffle, stooped but able to lift head while walking, may seek 0 No support from furniture) Impaired (short steps with shuffle, may have difficulty arising from chair, head down, impaired 0 No balance) Mental Status Oriented to own ability 0 No Electronic Signature(s) Signed: 11/11/2019 5:34:53 PM By: Baruch Gouty RN, BSN Entered By: Baruch Gouty on 11/11/2019 11:11:15 -------------------------------------------------------------------------------- Foot Assessment Details Patient Name: Date of Service: Erica Douglas A. 11/11/2019 10:30 A M Medical Record Number: [id number] Patient Account Number: [id number] Date of Birth/Sex: Treating RN: 1931-09-17 (86 y.o. Elam Dutch Primary Care Darnella Zeiter: Carol Ada Other Clinician: Referring Carlyon Nolasco: Treating Jeet Shough/Extender: Stefanie Libel, Candace Weeks in Treatment: 0 Foot Assessment Items [x]  Unable to perform due to altered mental status Site Locations + = Sensation present, - = Sensation absent, C = Callus, U = Ulcer R = Redness, W = Warmth, M = Maceration, PU = Pre-ulcerative lesion F = Fissure, S = Swelling, D = Dryness Assessment Right: Left: Other Deformity: No No Prior Foot Ulcer: No No Prior  Amputation: No No Charcot Joint: No No Ambulatory Status: Non-ambulatory Assistance Device: Wheelchair Gait: Electronic Signature(s) Signed: 11/11/2019 5:34:53 PM By: Baruch Gouty RN, BSN Entered By: Baruch Gouty on 11/11/2019 11:12:31 -------------------------------------------------------------------------------- Nutrition Risk Screening Details Patient Name: Date of Service: Erica Douglas A. 11/11/2019 10:30 A M Medical Record Number: [id number] Patient Account Number: [id number] Date of Birth/Sex: Treating RN: 1931-08-09 (86 y.o. Elam Dutch Primary Care Demia Viera: Carol Ada Other Clinician: Referring Belmont Valli: Treating Phillip Sandler/Extender: Stefanie Libel, Candace Weeks in Treatment: 0 Height (in): 67 Weight (lbs): Body Mass Index (BMI): Nutrition Risk Screening Items Score Screening NUTRITION RISK SCREEN: I have an illness or condition that made me change the kind and/or amount of food I eat 0 No I eat fewer than two meals per day 0 No I eat few fruits and vegetables, or milk products 2 Yes I have three or more drinks of beer, liquor or wine almost every day 0 No I have tooth or mouth problems that make it hard for me to eat 0 No I don't always have enough money to buy the food I need 0 No I eat alone most of the time 0 No I take three or more different prescribed or over-the-counter drugs a day 1 Yes Without wanting to, I have lost or gained 10 pounds in the last six months 2 Yes I am not always physically able to shop, cook and/or feed myself 2 Yes Nutrition Protocols Good Risk Protocol Moderate Risk Protocol High Risk Proctocol 0 Provide education on nutrition Risk Level: High Risk Score: 7 Electronic Signature(s) Signed: 11/11/2019 5:34:53 PM By: Baruch Gouty RN, BSN Entered By: Baruch Gouty on 11/11/2019 11:12:15

## 2019-11-17 ENCOUNTER — Encounter (HOSPITAL_BASED_OUTPATIENT_CLINIC_OR_DEPARTMENT_OTHER): Payer: Medicare Other | Admitting: Internal Medicine

## 2019-11-19 ENCOUNTER — Encounter: Payer: Self-pay | Admitting: Internal Medicine

## 2019-11-19 ENCOUNTER — Other Ambulatory Visit: Payer: Federal, State, Local not specified - PPO | Admitting: Internal Medicine

## 2019-11-19 DIAGNOSIS — Z515 Encounter for palliative care: Secondary | ICD-10-CM | POA: Diagnosis not present

## 2019-11-19 DIAGNOSIS — Z7189 Other specified counseling: Secondary | ICD-10-CM

## 2019-11-19 NOTE — Progress Notes (Signed)
11/19/2019 [facility] Consult Note Telephone: [phone]  Fax: [phone]   PATIENT NAME: [name] DOB: [birth year] MRN: [id number]  [address], [place] [address]   PRIMARY CARE PROVIDER: Debbora Presto NP/ Carol Ada, MD    REFERRING PROVIDER:  Carol Ada, [address],  [place] [id number]    RESPONSIBLE PARTY:   Hannahgrace, Lalli (Granddaughter) [phone] (M). [phone] (H)   ASSESSMENT / RECOMMENDATIONS:  1. Advance Care Planning: A. Directives: Reviewed with granddaughter/HCPOA Christine. [name] states she would not want CPR for patient, in the event of a cardiopulmonary arrest. I will drop off the DNR form to patient's home, and upload into [facility] EMR.  B. Goals of Care: To keep patient comfortable and as happy as she can be. [name] shared that patient's wound care physician felt that sacral wound has progressed to involve the bone; recommended hospice referral   2. Cognitive / Functional status: (reviewed and updated from prior note)  Patient with vascular dementia. She is continues somnolent, sleeping majority of the day unless directly engaged. She is HOH. Patient is A & O to person and place.    Patient was ambulating about independently until a fall early in May. She was in rehab for 2 weeks ([facility]), then memory care [facility]) for 2 weeks (to allow time for [name] to arrange for adequate services her in the home). She was home only briefly before readmission late May for sepsis 2/2 colitis. Home PT has been working with patient; she currently can transfer with 1-person moderate assist to chair, but can only tolerate for about 20-76min d/t fatigue and pain from her sacral wounds. She is too weak to reposition herself in her electric bed. She is a total assist for all ADLs including feeding. She is incontinent of urine /stool d/t bed ridden; able to communicate that she needs  to void/defecate.   Patient is on a chopped diet. She eats average of 25% of 3 small meals / day, with Ensure supplements. No recent weights obtained, but clothing fitting more loosely; daughter estimates patient has lost about 10-15 lbs over the last 1-2 months. Guesses current weight approximately 115 lb. At a height of 5'6" her BMI is18.8kg/m2. Liquids thickened with nectar thickener for dysphagia. She has an occasional clearing cough when eating.   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 Patient was seen in wound care clinic by Dr. Dellia Nims about 1 week earlier. Christine reports sacral wound may be involving the bone, and that wound would unlikely heal.  Pain from her wounds initially managed with 1/3 of a tramadol qd to bid, but has needed gradual dose adjustment upwards so that now 1 tab tid, supplemented with prn Tylenol.   3. Family Supports: (reviewed and updated from previous note) Patient and her granddaughter [name] share a home. Patient had 2 sons, both deceased. [name] was raised by her grandmother and feels she is more like a mother to her. Patient has 24/7 personal care coverage by live in aides, who rotate on a weekly basis. They also provide some light housekeeping. PT following.   4. Follow up: Will pursue hospice referral.    I spent 60 minutes providing this consultation from 4pm-5pm. More than 50% of the time in this consultation was spent coordinating communication.    HISTORY OF PRESENT ILLNESS:  [name] is  an 86 y.o. female with history of HTN, hyperlipidemia, CAD (CABG), CVA, vascular dementia. -5/21-10/01/2019: sepsis 2/2 colitis, AKI, pressure injury of the skin, metabolic encephalopathy, dysphagia.  -09/01/2019: ER s/p fall  This is a f/u Palliative Care telehealth visit from 10/16/2019.was asked to help address goals of care.    CODE STATUS: per granddaughter DNR, though form not  readily available, and granddaughter didn't wish to fill form out today.    PPS: 30%   HOSPICE ELIGIBILITY/DIAGNOSIS: Yes/failure to thrive  PAST MEDICAL HISTORY:  Past Medical History:  Diagnosis Date  . Actinic keratosis   . Allergic rhinitis   . Aneurysm ([place])   . Arthritis    osteoarthritis, back, hands, wrists  . Basal cell carcinoma   . Bursitis    hips bilat   . CAD (coronary artery disease), autologous vein bypass graft March 2008   Follow-up cath: December 2015 Occluded SVG-D1 and occluded SVG-RI.  [name] CAD in native artery 2005, 03/2014   a.  Severe disease noted in LAD & RI --> referred for CABG x3; b.  Follow-up cath for abnormal Myoview: Occluded SVG-RI and SVG-D1 with patent LIMA-LAD and competitive flow.  60-70% LAD and RI lesions.  Otherwise minimal disease.  EF 70%.  . Cancer ([place])    + basal cell- on leg  . Cataract   . Dementia ([place])   . Dyslipidemia    Statin intolerant  . Glaucoma   . History of kidney stones   . Hypertension   . Non-STEMI (non-ST elevated myocardial infarction) [facility] October 2005   EF by 35-40%, echo 40-50%. Angiography: 99% mid LAD involving D1 followed by 70% mid LAD; 80% RI. --> CABG  . Osteoarthritis   . PONV (postoperative nausea and vomiting)   . S/P CABG x 01 February 2004   LIMA-LAD, SVG-D1, SVG-RI.  [name] Statin intolerance   . Stroke [facility])     SOCIAL HX:  Social History   Tobacco Use  . Smoking status: Never Smoker  . Smokeless tobacco: Never Used  Substance Use Topics  . Alcohol use: Not Currently    Alcohol/week: 1.0 standard drink    Types: 1 Standard drinks or equivalent per week    Comment: 1 glass of liquor weekly     ALLERGIES:  Allergies  Allergen Reactions  . Nsaids Other (See Comments)    URINARY RETENTION  . Statins Other (See Comments)    MYALGIAS HURTS ALL OVER  . Tricor [Fenofibrate] Other (See Comments)    MYALGIAS HURTS ALL OVER  . Zetia [Ezetimibe] Other (See Comments)    MYALGIAS HURTS ALL OVER    . Lescol [Fluvastatin]     Muscle pain  . Livalo [Pitavastatin]     Muscle pain  . Niaspan [Niacin]     Severe flushing   . Relafen [Nabumetone] Rash     PERTINENT MEDICATIONS:  Outpatient Encounter Medications as of 11/19/2019  Medication Sig  . acetaminophen (TYLENOL) 500 MG tablet Take 500 mg by mouth every 6 (six) hours as needed. 1-2 tid; no more than 3000mg /day  . aspirin EC 81 MG tablet Take 81 mg by mouth at bedtime.   [name] atenolol (TENORMIN) 25 MG tablet Take 1 tablet (25 mg total) daily by mouth. May take an additional 25 mg of medication daily, if SBP> 160 or DBP >100 (Patient taking differently: Take 25 mg by mouth at bedtime. May take an additional 25 mg of medication daily, if SBP> 160 or DBP >100)  . brimonidine (ALPHAGAN  P) 0.1 % SOLN Place 1 drop into the left eye in the morning and at bedtime.  . clopidogrel (PLAVIX) 75 MG tablet Take 1 tablet (75 mg total) by mouth daily.  . dorzolamide-timolol (COSOPT) 22.3-6.8 MG/ML ophthalmic solution Place 1 drop into the left eye 2 (two) times daily.   [name] latanoprost (XALATAN) 0.005 % ophthalmic solution Place 1 drop into both eyes at bedtime.  [name] lisinopril (PRINIVIL,ZESTRIL) 20 MG tablet Take 20 mg by mouth at bedtime.   . traMADol-acetaminophen (ULTRACET) 37.5-325 MG tablet Take 1 tablet by mouth every 6 (six) hours as needed. 1-2 tabs q 6hr prn  . [DISCONTINUED] Cholecalciferol (VITAMIN D3) 50 MCG (2000 UT) capsule Take 1 capsule (2,000 Units total) by mouth at bedtime. (Patient not taking: Reported on 10/16/2019)  . [DISCONTINUED] donepezil (ARICEPT) 10 MG tablet Take 1 tablet (10 mg total) by mouth at bedtime. (Patient not taking: Reported on 10/16/2019)  . [DISCONTINUED] memantine (NAMENDA) 5 MG tablet Take 1 tablet (5 mg total) by mouth 2 (two) times daily. (Patient not taking: Reported on 10/16/2019)  . [DISCONTINUED] mirtazapine (REMERON) 7.5 MG tablet Take 7.5 mg by mouth at bedtime as needed (sleep/anxiety).  (Patient not taking:  Reported on 10/16/2019)   No facility-administered encounter medications on file as of 11/19/2019.    Julianne Handler, NP  [place]

## 2019-11-20 ENCOUNTER — Other Ambulatory Visit: Payer: Self-pay

## 2019-11-21 DIAGNOSIS — L8990 Pressure ulcer of unspecified site, unspecified stage: Secondary | ICD-10-CM | POA: Diagnosis not present

## 2019-11-25 ENCOUNTER — Encounter (HOSPITAL_BASED_OUTPATIENT_CLINIC_OR_DEPARTMENT_OTHER): Payer: Medicare Other | Admitting: Internal Medicine

## 2019-11-27 ENCOUNTER — Ambulatory Visit: Payer: Medicare Other | Admitting: Family Medicine

## 2019-12-05 ENCOUNTER — Telehealth: Payer: Self-pay | Admitting: Internal Medicine

## 2019-12-05 NOTE — Telephone Encounter (Signed)
11:30am   TC to daughter/HCPOA Christine. [facility], who had received a request to provide wound care products for patient, asked me for Christine's contact information, and also to fax over my most recent palliative care note, and for most recent note from patient's wound care provider. Colletta Maryland gave me her permission for the above.   [facility] fax: [phone] E-mail:  [email or web address] Office #: [phone] ext [place] NP-C [phone]

## 2019-12-07 DIAGNOSIS — G9340 Encephalopathy, unspecified: Secondary | ICD-10-CM | POA: Diagnosis not present

## 2019-12-07 DIAGNOSIS — L89322 Pressure ulcer of left buttock, stage 2: Secondary | ICD-10-CM | POA: Diagnosis not present

## 2019-12-07 DIAGNOSIS — F0280 Dementia in other diseases classified elsewhere without behavioral disturbance: Secondary | ICD-10-CM | POA: Diagnosis not present

## 2019-12-07 DIAGNOSIS — R1310 Dysphagia, unspecified: Secondary | ICD-10-CM | POA: Diagnosis not present

## 2019-12-07 DIAGNOSIS — J309 Allergic rhinitis, unspecified: Secondary | ICD-10-CM | POA: Diagnosis not present

## 2019-12-07 DIAGNOSIS — I25810 Atherosclerosis of coronary artery bypass graft(s) without angina pectoris: Secondary | ICD-10-CM | POA: Diagnosis not present

## 2019-12-07 DIAGNOSIS — L89154 Pressure ulcer of sacral region, stage 4: Secondary | ICD-10-CM | POA: Diagnosis not present

## 2019-12-07 DIAGNOSIS — H409 Unspecified glaucoma: Secondary | ICD-10-CM | POA: Diagnosis not present

## 2019-12-07 DIAGNOSIS — L89223 Pressure ulcer of left hip, stage 3: Secondary | ICD-10-CM | POA: Diagnosis not present

## 2019-12-07 DIAGNOSIS — Z681 Body mass index (BMI) 19 or less, adult: Secondary | ICD-10-CM | POA: Diagnosis not present

## 2019-12-07 DIAGNOSIS — D649 Anemia, unspecified: Secondary | ICD-10-CM | POA: Diagnosis not present

## 2019-12-07 DIAGNOSIS — R634 Abnormal weight loss: Secondary | ICD-10-CM | POA: Diagnosis not present

## 2019-12-07 DIAGNOSIS — L89214 Pressure ulcer of right hip, stage 4: Secondary | ICD-10-CM | POA: Diagnosis not present

## 2019-12-07 DIAGNOSIS — Z8673 Personal history of transient ischemic attack (TIA), and cerebral infarction without residual deficits: Secondary | ICD-10-CM | POA: Diagnosis not present

## 2019-12-07 DIAGNOSIS — G309 Alzheimer's disease, unspecified: Secondary | ICD-10-CM | POA: Diagnosis not present

## 2019-12-07 DIAGNOSIS — E785 Hyperlipidemia, unspecified: Secondary | ICD-10-CM | POA: Diagnosis not present

## 2019-12-07 DIAGNOSIS — E43 Unspecified severe protein-calorie malnutrition: Secondary | ICD-10-CM | POA: Diagnosis not present

## 2019-12-08 ENCOUNTER — Ambulatory Visit: Payer: Medicare Other | Admitting: Physician Assistant

## 2019-12-29 ENCOUNTER — Ambulatory Visit: Payer: Medicare Other | Admitting: Physician Assistant

## 2019-12-30 ENCOUNTER — Ambulatory Visit: Payer: Medicare Other | Admitting: Physician Assistant
